# Patient Record
Sex: Female | Born: 1940 | Race: White | Hispanic: No | Marital: Single | Smoking: Never smoker
Health system: Southern US, Community
[De-identification: ages and names within clinical notes are randomized; demographics above are authoritative.]

## PROBLEM LIST (undated history)

## (undated) DIAGNOSIS — K219 Gastro-esophageal reflux disease without esophagitis: Secondary | ICD-10-CM

## (undated) DIAGNOSIS — K635 Polyp of colon: Secondary | ICD-10-CM

## (undated) DIAGNOSIS — E039 Hypothyroidism, unspecified: Secondary | ICD-10-CM

## (undated) DIAGNOSIS — I10 Essential (primary) hypertension: Secondary | ICD-10-CM

## (undated) DIAGNOSIS — R011 Cardiac murmur, unspecified: Secondary | ICD-10-CM

## (undated) DIAGNOSIS — E559 Vitamin D deficiency, unspecified: Secondary | ICD-10-CM

## (undated) DIAGNOSIS — M8580 Other specified disorders of bone density and structure, unspecified site: Secondary | ICD-10-CM

## (undated) DIAGNOSIS — I6529 Occlusion and stenosis of unspecified carotid artery: Secondary | ICD-10-CM

## (undated) DIAGNOSIS — J302 Other seasonal allergic rhinitis: Secondary | ICD-10-CM

## (undated) DIAGNOSIS — M419 Scoliosis, unspecified: Secondary | ICD-10-CM

## (undated) DIAGNOSIS — B019 Varicella without complication: Secondary | ICD-10-CM

## (undated) DIAGNOSIS — K449 Diaphragmatic hernia without obstruction or gangrene: Secondary | ICD-10-CM

## (undated) DIAGNOSIS — I4891 Unspecified atrial fibrillation: Secondary | ICD-10-CM

## (undated) DIAGNOSIS — E785 Hyperlipidemia, unspecified: Secondary | ICD-10-CM

## (undated) DIAGNOSIS — E079 Disorder of thyroid, unspecified: Secondary | ICD-10-CM

## (undated) DIAGNOSIS — F419 Anxiety disorder, unspecified: Secondary | ICD-10-CM

## (undated) DIAGNOSIS — T7840XA Allergy, unspecified, initial encounter: Secondary | ICD-10-CM

## (undated) HISTORY — DX: Vitamin D deficiency, unspecified: E55.9

## (undated) HISTORY — DX: Hyperlipidemia, unspecified: E78.5

## (undated) HISTORY — DX: Cardiac murmur, unspecified: R01.1

## (undated) HISTORY — DX: Occlusion and stenosis of unspecified carotid artery: I65.29

## (undated) HISTORY — DX: Other specified disorders of bone density and structure, unspecified site: M85.80

## (undated) HISTORY — DX: Gastro-esophageal reflux disease without esophagitis: K21.9

## (undated) HISTORY — DX: Diaphragmatic hernia without obstruction or gangrene: K44.9

## (undated) HISTORY — DX: Unspecified atrial fibrillation: I48.91

## (undated) HISTORY — DX: Scoliosis, unspecified: M41.9

## (undated) HISTORY — DX: Disorder of thyroid, unspecified: E07.9

## (undated) HISTORY — DX: Varicella without complication: B01.9

## (undated) HISTORY — DX: Allergy, unspecified, initial encounter: T78.40XA

## (undated) HISTORY — DX: Hypothyroidism, unspecified: E03.9

## (undated) HISTORY — DX: Other seasonal allergic rhinitis: J30.2

## (undated) HISTORY — PX: TONSILLECTOMY: SHX5217

## (undated) HISTORY — DX: Polyp of colon: K63.5

## (undated) HISTORY — PX: EYE SURGERY: SHX253

## (undated) HISTORY — DX: Anxiety disorder, unspecified: F41.9

---

## 1986-08-04 HISTORY — PX: BREAST CYST EXCISION: SHX579

## 2010-12-18 ENCOUNTER — Emergency Department (HOSPITAL_COMMUNITY): Payer: Medicare Other

## 2010-12-18 ENCOUNTER — Inpatient Hospital Stay (HOSPITAL_COMMUNITY)
Admission: EM | Admit: 2010-12-18 | Discharge: 2010-12-19 | DRG: 310 | Disposition: A | Discharge status: Home / Self Care | Payer: Medicare Other | Attending: Cardiology | Admitting: Cardiology

## 2010-12-18 DIAGNOSIS — I4891 Unspecified atrial fibrillation: Principal | ICD-10-CM | POA: Diagnosis present

## 2010-12-18 DIAGNOSIS — E0590 Thyrotoxicosis, unspecified without thyrotoxic crisis or storm: Secondary | ICD-10-CM | POA: Diagnosis present

## 2010-12-18 DIAGNOSIS — E876 Hypokalemia: Secondary | ICD-10-CM | POA: Diagnosis present

## 2010-12-18 DIAGNOSIS — Z882 Allergy status to sulfonamides status: Secondary | ICD-10-CM

## 2010-12-18 LAB — URINALYSIS, ROUTINE W REFLEX MICROSCOPIC
Glucose, UA: NEGATIVE mg/dL
Ketones, ur: NEGATIVE mg/dL
Leukocytes, UA: NEGATIVE
Protein, ur: NEGATIVE mg/dL
Urobilinogen, UA: 0.2 mg/dL (ref 0.0–1.0)

## 2010-12-18 LAB — CBC
Hemoglobin: 14.5 g/dL (ref 12.0–15.0)
MCH: 31.7 pg (ref 26.0–34.0)
MCHC: 33.6 g/dL (ref 30.0–36.0)
Platelets: 159 10*3/uL (ref 150–400)
RBC: 4.58 MIL/uL (ref 3.87–5.11)
WBC: 6 10*3/uL (ref 4.0–10.5)

## 2010-12-18 LAB — DIFFERENTIAL
Basophils Absolute: 0 10*3/uL (ref 0.0–0.1)
Basophils Relative: 0 % (ref 0–1)
Eosinophils Absolute: 0.2 10*3/uL (ref 0.0–0.7)
Lymphocytes Relative: 24 % (ref 12–46)
Monocytes Absolute: 0.5 10*3/uL (ref 0.1–1.0)
Neutro Abs: 3.9 10*3/uL (ref 1.7–7.7)
Neutrophils Relative %: 65 % (ref 43–77)

## 2010-12-18 LAB — BASIC METABOLIC PANEL
BUN: 20 mg/dL (ref 6–23)
Calcium: 9.3 mg/dL (ref 8.4–10.5)
Chloride: 107 mEq/L (ref 96–112)
Creatinine, Ser: 0.81 mg/dL (ref 0.4–1.2)
GFR calc Af Amer: >60 mL/min (ref >60)
Glucose, Bld: 119 mg/dL — ABNORMAL HIGH (ref 70–99)
Sodium: 142 mEq/L (ref 135–145)

## 2010-12-18 LAB — TROPONIN I: Troponin I: <0.3 ng/mL (ref <0.30)

## 2010-12-18 LAB — POCT CARDIAC MARKERS
CKMB, poc: 3.2 ng/mL (ref 1.0–8.0)
Myoglobin, poc: 110 ng/mL (ref 12–200)
Troponin i, poc: <0.05 ng/mL (ref 0.00–0.09)

## 2010-12-18 LAB — CK TOTAL AND CKMB (NOT AT ARMC)
CK, MB: 5.6 ng/mL — ABNORMAL HIGH (ref 0.3–4.0)
Relative Index: 2.7 — ABNORMAL HIGH (ref 0.0–2.5)
Total CK: 206 U/L — ABNORMAL HIGH (ref 7–177)

## 2010-12-19 LAB — BASIC METABOLIC PANEL
BUN: 18 mg/dL (ref 6–23)
CO2: 27 mEq/L (ref 19–32)
Chloride: 108 mEq/L (ref 96–112)
Creatinine, Ser: 0.83 mg/dL (ref 0.4–1.2)
Glucose, Bld: 104 mg/dL — ABNORMAL HIGH (ref 70–99)
Potassium: 3.9 mEq/L (ref 3.5–5.1)

## 2010-12-19 LAB — HEPATIC FUNCTION PANEL
ALT: 19 U/L (ref 0–35)
AST: 21 U/L (ref 0–37)
Albumin: 3.2 g/dL — ABNORMAL LOW (ref 3.5–5.2)
Total Protein: 6 g/dL (ref 6.0–8.3)

## 2010-12-19 LAB — TSH: TSH: 0.97 u[IU]/mL (ref 0.350–4.500)

## 2010-12-19 LAB — CARDIAC PANEL(CRET KIN+CKTOT+MB+TROPI): Relative Index: 3.6 — ABNORMAL HIGH (ref 0.0–2.5)

## 2010-12-20 LAB — CARDIAC PANEL(CRET KIN+CKTOT+MB+TROPI)
Relative Index: 3.3 — ABNORMAL HIGH (ref 0.0–2.5)
Troponin I: 0.39 ng/mL (ref <0.30)

## 2010-12-25 NOTE — Discharge Summary (Signed)
NAMEMARQUISHA, [name]            ACCOUNT NO.:  [id number]  MEDICAL RECORD NO.:  [id number]           PATIENT TYPE:  I  LOCATION:  2013                         FACILITY:  [facility]  PHYSICIAN:  [place] Rocklyn Mayberry, MD         DATE OF BIRTH:  1941-02-27  DATE OF ADMISSION:  12/18/2010 DATE OF DISCHARGE:  12/19/2010                              DISCHARGE SUMMARY   DISCHARGE DIAGNOSES: 1. Atrial fibrillation with rapid ventricular response, new onset,     converted to normal sinus rhythm at 0100 hours morning. 2. History of hyperthyroidism, treated with levothyroxine. 3. Hypokalemia, repleted.  HOSPITAL COURSE:  [name] is a 70 year old Caucasian female originally from [place].  Her medical history is significant for hypothyroidism.  She says that she worked out exercising at 10 a.m. on Dec 18, 2010, and approximately 1330 hours after lunch, developed a strange feeling, felt like her heart was slumping and irregular.  She called EMS, brought her into [facility] ER where she was found to be in atrial fibrillation with RVR.  She was given 2 doses of IV metoprolol, 5 mg each, approximately 1 hour apart.  She was also given 20 mEq of p.o. potassium chloride and then given 10 mg of IV diltiazem approximately 1- 1/2 hours after second dose of metoprolol.  She was started on diltiazem drip at 5 mg an hour.  She converted to normal sinus rhythm approximately at 0100 hours on Dec 18, 2010.  Cardiac enzymes were mildly elevated, troponin was 0.39.  Repeat potassium came back at 3.9. Currently this morning, she has no complaints.  She did state she did not sleep well due to the tension she was getting throughout the evening.  IV diltiazem was discontinued upon conversion to normal sinus rhythm.  She was started on 120 mg of p.o. diltiazem and her heparin will be discontinued.  She will be started on Xarelto 20 mg p.o. daily. We did draw a stat LFTs to get baseline prior to starting the  Xarelto. She should be on Xarelto for approximately 30 days.  She will follow up with Dr. Herbie Baltimore at [facility] approximately 2 weeks.  Prior to that, she will have an outpatient 2D echocardiogram. She has currently been seen by Dr. Rennis Golden who feels that she is stable for discharge.  DISCHARGE LABS:  WBC 6.0, hemoglobin 14.5, hematocrit 43.1, platelets 159.  Sodium 142, potassium 3.9, chloride 108, carbon dioxide 27, glucose 104, BUN 18, creatinine 0.83, calcium 9.4.  Peak troponin of 0.39,  subsequent troponin less than 0.30, CK-MB of 5.3.  Thyroid stimulating hormone is 0.970.  Urinalysis was negative for infection.  STUDIES/PROCEDURES:  Chest x-ray, Dec 18, 2010, shows thoracic deformity related to pronounced levoscoliosis.  Suspicion of early interstitial edema.  Positive hiatal hernia.  DISCHARGE MEDICATIONS: 1. Diltiazem 120 mg CD/ER 1 tab by mouth daily. 2. Xarelto 20 mg 1 tab by mouth daily. 3. Calcium over the counter 1 tab by mouth daily. 4. Fish oil 1000 mg 1 capsule by mouth twice daily.5. Levothyroxine 100 mcg daily with the exemption on Mondays and  Thursdays, at which time she takes 150 mcg. 6. Multivitamin therapeutic 1 tab by mouth daily. 7. Vitamin D over the counter 1 tab by mouth twice daily.  DISPOSITION:  [name] will be discharged home in stable condition. She has no activity restrictions.  It is recommend she is to eat heart- healthy diet.  She will follow up with Dr. Herbie Baltimore in approximately 2 weeks, our office will call her with the appointment time and prior to that, she will have a 2D echocardiogram and will call her with that appointment as well.    ______________________________ Wilburt Finlay, PA   ______________________________ [place] Nada Godley, MD    BH/MEDQ  D:  12/19/2010  T:  12/20/2010  Job:  [id number]  cc:   [facility] Pam Drown, M.D.  Electronically Signed by Wilburt Finlay PA on  12/23/2010 01:50:14 PM Electronically Signed by Kirtland Bouchard. Kareema Keitt M.D. on 12/25/2010 95:62:13 AM

## 2012-01-12 ENCOUNTER — Other Ambulatory Visit: Payer: Self-pay | Admitting: Family Medicine

## 2012-01-12 DIAGNOSIS — I83819 Varicose veins of unspecified lower extremities with pain: Secondary | ICD-10-CM

## 2012-01-14 ENCOUNTER — Inpatient Hospital Stay
Admission: RE | Admit: 2012-01-14 | Discharge: 2012-01-14 | Payer: Medicare Other | Source: Ambulatory Visit | Attending: Family Medicine | Admitting: Family Medicine

## 2012-01-14 ENCOUNTER — Other Ambulatory Visit: Payer: Medicare Other

## 2012-07-09 ENCOUNTER — Other Ambulatory Visit: Payer: Self-pay | Admitting: Family Medicine

## 2012-07-09 DIAGNOSIS — Z1231 Encounter for screening mammogram for malignant neoplasm of breast: Secondary | ICD-10-CM

## 2012-08-13 ENCOUNTER — Ambulatory Visit
Admission: RE | Admit: 2012-08-13 | Discharge: 2012-08-13 | Disposition: A | Discharge status: Home / Self Care | Payer: Medicare Other | Source: Ambulatory Visit | Attending: Family Medicine | Admitting: Family Medicine

## 2012-08-13 DIAGNOSIS — Z1231 Encounter for screening mammogram for malignant neoplasm of breast: Secondary | ICD-10-CM

## 2013-04-26 ENCOUNTER — Encounter: Payer: Self-pay | Admitting: *Deleted

## 2013-04-26 ENCOUNTER — Encounter: Payer: Self-pay | Admitting: Interventional Cardiology

## 2013-04-26 DIAGNOSIS — I4891 Unspecified atrial fibrillation: Secondary | ICD-10-CM | POA: Insufficient documentation

## 2013-04-26 DIAGNOSIS — E039 Hypothyroidism, unspecified: Secondary | ICD-10-CM | POA: Insufficient documentation

## 2013-04-26 DIAGNOSIS — K635 Polyp of colon: Secondary | ICD-10-CM | POA: Insufficient documentation

## 2013-04-26 DIAGNOSIS — E785 Hyperlipidemia, unspecified: Secondary | ICD-10-CM | POA: Insufficient documentation

## 2013-04-26 DIAGNOSIS — E559 Vitamin D deficiency, unspecified: Secondary | ICD-10-CM | POA: Insufficient documentation

## 2013-04-28 ENCOUNTER — Encounter: Payer: Self-pay | Admitting: Interventional Cardiology

## 2013-05-06 ENCOUNTER — Ambulatory Visit (INDEPENDENT_AMBULATORY_CARE_PROVIDER_SITE_OTHER): Payer: Medicare Other | Admitting: Interventional Cardiology

## 2013-05-06 ENCOUNTER — Encounter: Payer: Self-pay | Admitting: Interventional Cardiology

## 2013-05-06 VITALS — BP 145/84 | HR 70 | Ht 67.0 in | Wt 157.0 lb

## 2013-05-06 DIAGNOSIS — R609 Edema, unspecified: Secondary | ICD-10-CM

## 2013-05-06 DIAGNOSIS — I359 Nonrheumatic aortic valve disorder, unspecified: Secondary | ICD-10-CM

## 2013-05-06 DIAGNOSIS — I4891 Unspecified atrial fibrillation: Secondary | ICD-10-CM

## 2013-05-06 MED ORDER — DILTIAZEM HCL ER COATED BEADS 120 MG PO CP24: 120.0000 mg | ORAL_CAPSULE | Freq: Every day | ORAL | Status: DC

## 2013-05-06 NOTE — Patient Instructions (Addendum)
Your physician recommends that you continue on your current medications as directed. Please refer to the Current Medication list given to you today.  Your physician wants you to follow-up in: 1 Year with Dr. Eldridge Dace. You will receive a reminder letter in the mail two months in advance. If you don't receive a letter, please call our office to schedule the follow-up appointment.  Your physician has requested that you have an echocardiogram. Echocardiography is a painless test that uses sound waves to create images of your heart. It provides your doctor with information about the size and shape of your heart and how well your heart's chambers and valves are working. This procedure takes approximately one hour. There are no restrictions for this procedure.

## 2013-05-06 NOTE — Progress Notes (Signed)
Patient ID: Stacy [name], female   DOB: Sep 16, 1940, 72 y.o.   MRN: [id number]    [address] [place], [place]  [address] Phone: [phone] Fax:  [phone]  Date:  05/06/2013   ID:  Stacy [name], DOB [birth year], MRN [id number]  PCP:  No primary provider on file.      History of Present Illness: Stacy [name] is a 72 y.o. female who had an episode of atrial fibrillation in 5/12. She spontaneously converted within the first 24 hours. She was on xarelto, and coumadin but decided to stop anticoagulation die to the need for testing. Her symptoms were very distinct. She has not had any other symptoms since that time. Atrial Fibrillation F/U:  exercises 3x/week. She goes ti the [employer] and does weights, treadmill, classes. No symptoms with this activity. Denies : Chest pain.  Dizziness.  Orthopnea.  Palpitations.  Syncope.     Wt Readings from Last 3 Encounters:  05/06/13 157 lb (71.215 kg)     Past Medical History  Diagnosis Date  . Hypothyroidism   . Osteopenia   . Colon polyp   . Vitamin D deficiency   . Dyslipidemia   . Atrial fibrillation     echo 12/23/10 EF= >55%, stress myoview 01/30/11 normal pattern of perfusion in all myocardial regions    Current Outpatient Prescriptions  Medication Sig Dispense Refill  . aspirin 81 MG tablet Take 81 mg by mouth daily.      . calcium carbonate (OS-CAL) 600 MG TABS tablet Take 600 mg by mouth 2 (two) times daily with a meal.      . Cholecalciferol (VITAMIN D-3) 1000 UNITS CAPS Take by mouth.      . diltiazem (CARDIZEM) 120 MG tablet Take 120 mg by mouth 4 (four) times daily.      . fish oil-omega-3 fatty acids 1000 MG capsule Take 2 g by mouth daily.      [name] Kitchen levothyroxine (SYNTHROID, LEVOTHROID) 100 MCG tablet Take 100 mcg by mouth daily before breakfast.      . LORazepam (ATIVAN) 0.5 MG tablet Take 0.5 mg by mouth every 8 (eight) hours.      . Multiple Vitamin (MULTIVITAMIN) capsule Take 1 capsule by mouth daily.        No current facility-administered medications for this visit.    Allergies:    Allergies  Allergen Reactions  . Sulfa Antibiotics     Social History:  The patient  reports that she has never smoked. She does not have any smokeless tobacco history on file. She reports that she drinks about 0.6 ounces of alcohol per week. She reports that she does not use illicit drugs.   Family History:  The patient's family history includes Anuerysm in her father; CVA in her mother.   ROS:  Please see the history of present illness.  No nausea, vomiting.  No fevers, chills.  No focal weakness.  No dysuria.    All other systems reviewed and negative.   PHYSICAL EXAM: VS:  BP 145/84  Pulse 70  Ht 5\' 7"  (1.702 m)  Wt 157 lb (71.215 kg)  BMI 24.58 kg/m2 Well nourished, well developed, in no acute distress HEENT: normal Neck: no JVD, no carotid bruits Cardiac:  normal S1, S2; RRR; 1/6 systolic murmur,  1/6 diastolic murmur Lungs:  clear to auscultation bilaterally, no wheezing, rhonchi or rales Abd: soft, nontender, no hepatomegaly Ext: bilateral ankle edema Skin: warm and dry Neuro:   no focal abnormalities  noted  EKG:  NSR, LAD, PRWP  ASSESSMENT AND PLAN:  Atrial fibrillation  One known episode of AFib. No further symptoms. SHe does not want to take COumadin or Xarelto and has a good understanding of the risks and benefits. She is comfortable taking aspirin. I have also asked her to stay on the diltiazem as this may help prevent fast heartbeats. She would be more open to taking anticoagulation if she had more frequent episodes of atrial fibrillation. She has changed doctors twice because of this issue. She understands that there are risks of bleeding and dietary restrictions on Coumadin better not present on aspirin. Given her infrequent symptoms, she prefers taking aspirin, despite the risk of stroke. We discussed this today. Prior stress test reviewed. Normal LV function by echo in 2012.     2. Edema of both legs  May be related to Diltiazem. Better in the morning. Try to get ankles above the level of the heart while asleep. Could use support stocking if things get worse.  3. AI- Mild to moderate in 2012.  Would repeat echo.    Signed, Fredric Mare, MD, [facility] 05/06/2013 12:01 PM

## 2013-05-10 ENCOUNTER — Other Ambulatory Visit (HOSPITAL_COMMUNITY): Payer: Self-pay | Admitting: Interventional Cardiology

## 2013-05-10 DIAGNOSIS — I359 Nonrheumatic aortic valve disorder, unspecified: Secondary | ICD-10-CM

## 2013-05-11 ENCOUNTER — Other Ambulatory Visit: Payer: Self-pay

## 2013-05-11 ENCOUNTER — Ambulatory Visit (HOSPITAL_COMMUNITY): Payer: Medicare Other | Attending: Cardiology | Admitting: Radiology

## 2013-05-11 DIAGNOSIS — R609 Edema, unspecified: Secondary | ICD-10-CM | POA: Insufficient documentation

## 2013-05-11 DIAGNOSIS — I379 Nonrheumatic pulmonary valve disorder, unspecified: Secondary | ICD-10-CM | POA: Insufficient documentation

## 2013-05-11 DIAGNOSIS — I4891 Unspecified atrial fibrillation: Secondary | ICD-10-CM | POA: Insufficient documentation

## 2013-05-11 DIAGNOSIS — I079 Rheumatic tricuspid valve disease, unspecified: Secondary | ICD-10-CM | POA: Insufficient documentation

## 2013-05-11 DIAGNOSIS — I359 Nonrheumatic aortic valve disorder, unspecified: Secondary | ICD-10-CM | POA: Insufficient documentation

## 2013-05-11 DIAGNOSIS — R011 Cardiac murmur, unspecified: Secondary | ICD-10-CM | POA: Insufficient documentation

## 2013-05-11 NOTE — Progress Notes (Signed)
Echocardiogram performed.  

## 2013-05-18 ENCOUNTER — Telehealth: Payer: Self-pay | Admitting: Interventional Cardiology

## 2013-05-18 NOTE — Telephone Encounter (Addendum)
New Problem  Pt states that she is having side effects from newly prescribed Diltiazem, extended release.. Symptoms are waking up and not able to sleep/// feeling hyperactive.. Request a call back to discuss.

## 2013-05-18 NOTE — Telephone Encounter (Signed)
Called stating she was changed to Dilitazem long acting at last office visit.  Started taking the extended release Oct 3.  For past few nights she has been unable to get back to sleep after getting up and has felt very "hyper".  Didn't realize the this was extended release.  Is taking 120 mg of Dilitazem CD but would like to switch back to regular dose.  Pharmacy is [facility] on [address] and would like a 90 day supply. Please advise.  Will forward to Dr. Eldridge Dace.

## 2013-05-18 NOTE — Telephone Encounter (Signed)
Is this expected?  OK to call in what she wants.

## 2013-05-18 NOTE — Telephone Encounter (Signed)
Follow up   Waiting to hear if pres has been called in----Pt thinks Dr Eldridge Dace is calling her back today

## 2013-05-19 MED ORDER — DILTIAZEM HCL 120 MG PO TABS: 120.0000 mg | ORAL_TABLET | Freq: Every day | ORAL | Status: DC

## 2013-05-19 NOTE — Telephone Encounter (Signed)
Returned call to patient Dr.Varanasi advised ok to go back to regular diltiazem.Patient stated she use to take diltiazem 120 mg daily.Prescription sent to pharmacy.

## 2013-06-08 ENCOUNTER — Ambulatory Visit (INDEPENDENT_AMBULATORY_CARE_PROVIDER_SITE_OTHER): Payer: Medicare Other | Admitting: Interventional Cardiology

## 2013-06-08 ENCOUNTER — Encounter: Payer: Self-pay | Admitting: Interventional Cardiology

## 2013-06-08 VITALS — BP 152/100 | HR 68 | Ht 67.0 in | Wt 159.0 lb

## 2013-06-08 DIAGNOSIS — R609 Edema, unspecified: Secondary | ICD-10-CM

## 2013-06-08 DIAGNOSIS — I4891 Unspecified atrial fibrillation: Secondary | ICD-10-CM

## 2013-06-08 DIAGNOSIS — T148XXA Other injury of unspecified body region, initial encounter: Secondary | ICD-10-CM

## 2013-06-08 NOTE — Progress Notes (Signed)
Patient ID: Stacy [name], female   DOB: 1941/04/10, 72 y.o.   MRN: [id number] Patient ID: Stacy [name], female   DOB: Apr 03, 1941, 72 y.o.   MRN: [id number]    [address] [place], [place]  [address] Phone: [phone] Fax:  [phone]  Date:  06/08/2013   ID:  Stacy [name], DOB 1940/09/04, MRN [id number]  PCP:  No primary provider on file.      History of Present Illness: Stacy [name] is a 72 y.o. female who had an episode of atrial fibrillation in 5/12. She spontaneously converted within the first 24 hours. She was on xarelto, and coumadin but decided to stop anticoagulation due to the need for testing. Her symptoms were very distinct. She has not had any other symptoms since that time. Atrial Fibrillation F/U:  exercises 3x/week. She goes ti the [employer] and does weights, treadmill, classes. No symptoms with this activity. Denies : Chest pain.  Dizziness.  Orthopnea.  Palpitations.  Syncope.   Last night after taking off her shoes, she notice a large bruise on the insode of her right foot.  SHe has had varicose veins.  She did not have any pain.  Mild swelling bilaterally, more than usual.        Wt Readings from Last 3 Encounters:  06/08/13 159 lb (72.122 kg)  05/06/13 157 lb (71.215 kg)     Past Medical History  Diagnosis Date  . Hypothyroidism   . Osteopenia   . Colon polyp   . Vitamin D deficiency   . Dyslipidemia   . Atrial fibrillation     echo 12/23/10 EF= >55%, stress myoview 01/30/11 normal pattern of perfusion in all myocardial regions    Current Outpatient Prescriptions  Medication Sig Dispense Refill  . aspirin 81 MG tablet Take 81 mg by mouth daily.      . calcium carbonate (OS-CAL) 600 MG TABS tablet Take 600 mg by mouth 2 (two) times daily with a meal.      . Cholecalciferol (VITAMIN D-3) 1000 UNITS CAPS Take by mouth.      . diltiazem (CARDIZEM) 120 MG tablet Take 1 tablet (120 mg total) by mouth daily.  90 tablet  3  . fish  oil-omega-3 fatty acids 1000 MG capsule Take 2 g by mouth daily.      [name] Kitchen levothyroxine (SYNTHROID, LEVOTHROID) 100 MCG tablet Take 100 mcg by mouth daily before breakfast.      . Multiple Vitamin (MULTIVITAMIN) capsule Take 1 capsule by mouth daily.       No current facility-administered medications for this visit.    Allergies:    Allergies  Allergen Reactions  . Sulfa Antibiotics     Social History:  The patient  reports that she has never smoked. She does not have any smokeless tobacco history on file. She reports that she drinks about 0.6 ounces of alcohol per week. She reports that she does not use illicit drugs.   Family History:  The patient's family history includes Anuerysm in her father; CVA in her mother.   ROS:  Please see the history of present illness.  No nausea, vomiting.  No fevers, chills.  No focal weakness.  No dysuria.    All other systems reviewed and negative.   PHYSICAL EXAM: VS:  BP 152/100  Pulse 68  Ht 5\' 7"  (1.702 m)  Wt 159 lb (72.122 kg)  BMI 24.90 kg/m2  SpO2 96% Well nourished, well developed, in no acute distress HEENT: normal  Neck: no JVD, no carotid bruits Cardiac:  normal S1, S2; RRR; 1/6 systolic murmur,  1/6 diastolic murmur Lungs:  clear to auscultation bilaterally, no wheezing, rhonchi or rales Abd: soft, nontender, no hepatomegaly Ext: 1+ bilateral ankle edema Skin: warm and dry Neuro:   no focal abnormalities noted  EKG:  NSR, LAD, PRWP  ASSESSMENT AND PLAN:  Atrial fibrillation  One known episode of AFib. No further symptoms. SHe does not want to take COumadin or Xarelto and has a good understanding of the risks and benefits. She is comfortable taking aspirin. I have also asked her to stay on the diltiazem as this may help prevent fast heartbeats. She would be more open to taking anticoagulation if she had more frequent episodes of atrial fibrillation. She has changed doctors twice because of this issue. She understands that there  are risks of bleeding and dietary restrictions on Coumadin better not present on aspirin. Given her infrequent symptoms, she prefers taking aspirin, despite the risk of stroke. We discussed this today. Prior stress test reviewed. Normal LV function by echo in 2012.   2. Edema of both legs / leg pain and bruising May be related to Diltiazem. Better in the morning.Bruising likely related to a broken varicose vein.  No pain.   Try to get ankles above the level of the heart while asleep. Could use support stocking if things get worse.  Warm compresses to right foot. 3. AI- Mild to moderate in 2012.  Echo repeated in 2014.   Signed, Fredric Mare, MD, [facility] 06/08/2013 2:30 PM

## 2013-06-08 NOTE — Patient Instructions (Signed)
Your physician recommends that you follow up as scheduled.

## 2013-08-03 ENCOUNTER — Telehealth: Payer: Self-pay | Admitting: Interventional Cardiology

## 2013-08-03 NOTE — Telephone Encounter (Signed)
Stacy [name] is this a side effect?

## 2013-08-03 NOTE — Telephone Encounter (Signed)
Pt notified. Pt will f/u with pcp if pale stools continue to occur.

## 2013-08-03 NOTE — Telephone Encounter (Signed)
New message    Pt thinks she is having a side effect from diltiazem----pale stools.  She has no other symptoms

## 2013-08-03 NOTE — Telephone Encounter (Signed)
No, this isn't a side effect of diltiazem that I've ever seen, nor is it one I can find in the drug literature.

## 2013-08-17 ENCOUNTER — Ambulatory Visit (INDEPENDENT_AMBULATORY_CARE_PROVIDER_SITE_OTHER): Payer: Managed Care, Other (non HMO) | Admitting: Family Medicine

## 2013-08-17 ENCOUNTER — Encounter: Payer: Self-pay | Admitting: Family Medicine

## 2013-08-17 VITALS — BP 124/80 | Temp 98.6°F | Ht 66.0 in | Wt 159.0 lb

## 2013-08-17 DIAGNOSIS — Z7689 Persons encountering health services in other specified circumstances: Secondary | ICD-10-CM

## 2013-08-17 DIAGNOSIS — Z7189 Other specified counseling: Secondary | ICD-10-CM

## 2013-08-17 DIAGNOSIS — M949 Disorder of cartilage, unspecified: Secondary | ICD-10-CM

## 2013-08-17 DIAGNOSIS — E785 Hyperlipidemia, unspecified: Secondary | ICD-10-CM

## 2013-08-17 DIAGNOSIS — M8580 Other specified disorders of bone density and structure, unspecified site: Secondary | ICD-10-CM

## 2013-08-17 DIAGNOSIS — M899 Disorder of bone, unspecified: Secondary | ICD-10-CM

## 2013-08-17 DIAGNOSIS — E559 Vitamin D deficiency, unspecified: Secondary | ICD-10-CM

## 2013-08-17 DIAGNOSIS — E039 Hypothyroidism, unspecified: Secondary | ICD-10-CM

## 2013-08-17 DIAGNOSIS — I4891 Unspecified atrial fibrillation: Secondary | ICD-10-CM

## 2013-08-17 LAB — TSH: TSH: 1.18 u[IU]/mL (ref 0.35–5.50)

## 2013-08-17 LAB — BASIC METABOLIC PANEL
BUN: 19 mg/dL (ref 6–23)
CHLORIDE: 104 meq/L (ref 96–112)
CO2: 28 mEq/L (ref 19–32)
Calcium: 9.2 mg/dL (ref 8.4–10.5)
Creatinine, Ser: 0.9 mg/dL (ref 0.4–1.2)
GFR: 65.08 mL/min (ref >60.00)
GLUCOSE: 81 mg/dL (ref 70–99)
POTASSIUM: 4 meq/L (ref 3.5–5.1)
Sodium: 139 mEq/L (ref 135–145)

## 2013-08-17 LAB — LDL CHOLESTEROL, DIRECT: Direct LDL: 148.1 mg/dL

## 2013-08-17 LAB — LIPID PANEL
Cholesterol: 243 mg/dL — ABNORMAL HIGH (ref 0–200)
HDL: 79.3 mg/dL (ref >39.00)
Total CHOL/HDL Ratio: 3
Triglycerides: 88 mg/dL (ref 0.0–149.0)
VLDL: 17.6 mg/dL (ref 0.0–40.0)

## 2013-08-17 LAB — T4, FREE: Free T4: 1.36 ng/dL (ref 0.60–1.60)

## 2013-08-17 NOTE — Progress Notes (Signed)
Pre visit review using our clinic review tool, if applicable. No additional management support is needed unless otherwise documented below in the visit note. 

## 2013-08-17 NOTE — Progress Notes (Signed)
Chief Complaint  Patient presents with  . Establish Care    HPI:  Makela Niehoff is here to establish care. Used to see Dr. Brien Mates - no longer excepts her insurance. Last PCP and physical: had physical 1 year ago, but had labs in June and all was ok.  Has the following chronic problems and concerns today:  Patient Active Problem List   Diagnosis Date Noted  . Edema 05/06/2013  . Hypothyroidism   . Osteopenia   . Colon polyp   . Vitamin D deficiency   . Dyslipidemia   . Atrial fibrillation    Sees cardiologist for her Atrial Fib - only had one episode.   Hypothyroid: -stable for a long time per patient  Sees opthomologist for eye exams  Health Maintenance:  ROS: See pertinent positives and negatives per HPI.  Past Medical History  Diagnosis Date  . Hypothyroidism   . Osteopenia   . Colon polyp   . Vitamin D deficiency   . Dyslipidemia   . Atrial fibrillation     echo 12/23/10 EF= >55%, stress myoview 01/30/11 normal pattern of perfusion in all myocardial regions  . Chicken pox   . Seasonal allergies   . Heart murmur   . Scoliosis   . Thyroid disease   . Colon polyp     Family History  Problem Relation Age of Onset  . CVA Mother   . Anuerysm Father     brain  . Hyperlipidemia Mother   . Stroke Maternal Grandmother   . Hypertension Mother   . Hypertension Sister   . Hypertension Maternal Grandmother     History   Social History  . Marital Status: Single    Spouse Name: N/A    Number of Children: N/A  . Years of Education: N/A   Social History Main Topics  . Smoking status: Never Smoker   . Smokeless tobacco: None  . Alcohol Use: 0.6 oz/week    1 Glasses of wine per week     Comment: 1 glass of winr 3x a week  . Drug Use: No  . Sexual Activity: None   Other Topics Concern  . None   Social History Narrative   Work or School: Probation officer - poetry      Home Situation: lives alone      Spiritual Beliefs: Jewish      Lifestyle: [facility]  [employer] [facility]; diet healthy             Current outpatient prescriptions:aspirin 81 MG tablet, Take 81 mg by mouth daily., Disp: , Rfl: ;  calcium carbonate (OS-CAL) 600 MG TABS tablet, Take 600 mg by mouth 2 (two) times daily with a meal., Disp: , Rfl: ;  Cholecalciferol (VITAMIN D-3) 1000 UNITS CAPS, Take by mouth., Disp: , Rfl: ;  diltiazem (CARDIZEM) 120 MG tablet, Take 1 tablet (120 mg total) by mouth daily., Disp: 90 tablet, Rfl: 3 fish oil-omega-3 fatty acids 1000 MG capsule, Take 2 g by mouth daily., Disp: , Rfl: ;  levothyroxine (SYNTHROID, LEVOTHROID) 100 MCG tablet, Take 100 mcg by mouth daily before breakfast. Take .5 extra pill on Monday and Thursday, Disp: , Rfl: ;  Multiple Vitamin (MULTIVITAMIN) capsule, Take 1 capsule by mouth daily., Disp: , Rfl:   EXAM:  Filed Vitals:   08/17/13 1111  BP: 124/80  Temp: 98.6 F (37 C)    Body mass index is 25.68 kg/(m^2).  GENERAL: vitals reviewed and listed above, alert, oriented, appears  well hydrated and in no acute distress  HEENT: atraumatic, conjunttiva clear, no obvious abnormalities on inspection of external nose and ears  NECK: no obvious masses on inspection  LUNGS: clear to auscultation bilaterally, no wheezes, rales or rhonchi, good air movement  CV: HRRR, no peripheral edema  MS: moves all extremities without noticeable abnormality  PSYCH: pleasant and cooperative, no obvious depression or anxiety  ASSESSMENT AND PLAN:  Discussed the following assessment and plan:  Dyslipidemia - Plan: Lipid Panel  Hypothyroidism - Plan: TSH, T4, Free  Osteopenia  Vitamin D deficiency  Atrial fibrillation - Plan: Basic metabolic panel  Encounter to establish care  -We reviewed the PMH, PSH, FH, SH, Meds and Allergies. -We provided refills for any medications we will prescribe as needed. -We addressed current concerns per orders and patient instructions. -We have asked for records for pertinent exams, studies,  vaccines and notes from previous providers. -We have advised patient to follow up per instructions below. -NON fasting labs -follow up for medicare exam in 4-6 months  -Patient advised to return or notify a doctor immediately if symptoms worsen or persist or new concerns arise.  Patient Instructions  -We have ordered labs or studies at this visit. It can take up to 1-2 weeks for results and processing. We will contact you with instructions IF your results are abnormal. Normal results will be released to your [facility]. If you have not heard from [place] or can not find your results in [facility] in 2 weeks please contact our office.  -PLEASE SIGN UP FOR MYCHART TODAY   We recommend the following healthy lifestyle measures: - eat a healthy diet consisting of lots of vegetables, fruits, beans, nuts, seeds, healthy meats such as white chicken and fish and whole grains.  - avoid fried foods, fast food, processed foods, sodas, red meet and other fattening foods.  - get a least 150 minutes of aerobic exercise per week.   Follow up in: 4-6 months for medicare wellness      KIM, HANNAH R.

## 2013-08-17 NOTE — Patient Instructions (Signed)
-  We have ordered labs or studies at this visit. It can take up to 1-2 weeks for results and processing. We will contact you with instructions IF your results are abnormal. Normal results will be released to your [facility]. If you have not heard from [place] or can not find your results in [facility] in 2 weeks please contact our office.  -PLEASE SIGN UP FOR MYCHART TODAY   We recommend the following healthy lifestyle measures: - eat a healthy diet consisting of lots of vegetables, fruits, beans, nuts, seeds, healthy meats such as white chicken and fish and whole grains.  - avoid fried foods, fast food, processed foods, sodas, red meet and other fattening foods.  - get a least 150 minutes of aerobic exercise per week.   Follow up in: 4-6 months for medicare wellness

## 2013-09-02 ENCOUNTER — Encounter: Payer: Self-pay | Admitting: Family Medicine

## 2013-09-05 MED ORDER — LEVOTHYROXINE SODIUM 100 MCG PO TABS: 100.0000 ug | ORAL_TABLET | Freq: Every day | ORAL | Status: DC

## 2013-09-05 NOTE — Telephone Encounter (Signed)
Yes, thyroid lab was normal, so continue current dose. New prescription sent per request.

## 2013-09-05 NOTE — Telephone Encounter (Signed)
Will you continue the synthroid at the same directions and strength?

## 2013-09-09 ENCOUNTER — Other Ambulatory Visit: Payer: Self-pay

## 2013-09-09 ENCOUNTER — Encounter: Payer: Self-pay | Admitting: Family Medicine

## 2013-09-09 MED ORDER — LEVOTHYROXINE SODIUM 100 MCG PO TABS: 100.0000 ug | ORAL_TABLET | Freq: Every day | ORAL | Status: DC

## 2013-09-09 NOTE — Telephone Encounter (Signed)
Pt called and stated that the quantity of synthroid is not correct.  Sent correct quantity to [facility].

## 2013-10-11 ENCOUNTER — Encounter: Payer: Self-pay | Admitting: Family Medicine

## 2013-10-17 ENCOUNTER — Ambulatory Visit (INDEPENDENT_AMBULATORY_CARE_PROVIDER_SITE_OTHER): Payer: Managed Care, Other (non HMO) | Admitting: Family Medicine

## 2013-10-17 ENCOUNTER — Encounter: Payer: Self-pay | Admitting: Family Medicine

## 2013-10-17 VITALS — BP 124/80 | Temp 98.3°F | Wt 155.0 lb

## 2013-10-17 DIAGNOSIS — M79609 Pain in unspecified limb: Secondary | ICD-10-CM

## 2013-10-17 DIAGNOSIS — L600 Ingrowing nail: Secondary | ICD-10-CM

## 2013-10-17 DIAGNOSIS — M79676 Pain in unspecified toe(s): Secondary | ICD-10-CM

## 2013-10-17 NOTE — Progress Notes (Signed)
Pre visit review using our clinic review tool, if applicable. No additional management support is needed unless otherwise documented below in the visit note. 

## 2013-10-17 NOTE — Progress Notes (Signed)
Chief Complaint  Patient presents with  . 2nd toe on right foot bruised    HPI:  Acute visit for toe pain: -started about 1 month ago -stubbed R 2nd digit and seen in urgent care with xrays normal -improved but still has some pain at tip of toe when wears certain toes -also several ingrown toenails and nail fungus  ROS: See pertinent positives and negatives per HPI.  Past Medical History  Diagnosis Date  . Hypothyroidism   . Osteopenia   . Colon polyp   . Vitamin D deficiency   . Dyslipidemia   . Atrial fibrillation     echo 12/23/10 EF= >55%, stress myoview 01/30/11 normal pattern of perfusion in all myocardial regions  . Chicken pox   . Seasonal allergies   . Heart murmur   . Scoliosis   . Thyroid disease   . Colon polyp     Past Surgical History  Procedure Laterality Date  . Tonsillectomy      Childhood  . Breast cyst excision Right 1988    Family History  Problem Relation Age of Onset  . CVA Mother   . Anuerysm Father     brain  . Hyperlipidemia Mother   . Stroke Maternal Grandmother   . Hypertension Mother   . Hypertension Sister   . Hypertension Maternal Grandmother     History   Social History  . Marital Status: Single    Spouse Name: N/A    Number of Children: N/A  . Years of Education: N/A   Social History Main Topics  . Smoking status: Never Smoker   . Smokeless tobacco: None  . Alcohol Use: 0.6 oz/week    1 Glasses of wine per week     Comment: 1 glass of winr 3x a week  . Drug Use: No  . Sexual Activity: None   Other Topics Concern  . None   Social History Narrative   Work or School: Probation officer - poetry      Home Situation: lives alone      Spiritual Beliefs: Jewish      Lifestyle[facility]; diet healthy             Current outpatient prescriptions:aspirin 81 MG tablet, Take 81 mg by mouth daily., Disp: , Rfl: ;  calcium carbonate (OS-CAL) 600 MG TABS tablet, Take 600 mg by mouth 2 (two) times daily with a  meal., Disp: , Rfl: ;  Cholecalciferol (VITAMIN D-3) 1000 UNITS CAPS, Take by mouth., Disp: , Rfl: ;  diltiazem (CARDIZEM) 120 MG tablet, Take 1 tablet (120 mg total) by mouth daily., Disp: 90 tablet, Rfl: 3 fish oil-omega-3 fatty acids 1000 MG capsule, Take 2 g by mouth daily., Disp: , Rfl: ;  levothyroxine (SYNTHROID, LEVOTHROID) 100 MCG tablet, Take 1 tablet (100 mcg total) by mouth daily before breakfast. Take .5 extra pill on Monday and Thursday, Disp: 102 tablet, Rfl: 3;  Multiple Vitamin (MULTIVITAMIN) capsule, Take 1 capsule by mouth daily., Disp: , Rfl:   EXAM:  Filed Vitals:   10/17/13 1556  BP: 124/80  Temp: 98.3 F (36.8 C)    Body mass index is 25.03 kg/(m^2).  GENERAL: vitals reviewed and listed above, alert, oriented, appears well hydrated and in no acute distress  FEET: ingrown toenail of 2nd R digit with pain with pressure over this nail, no swelling or erythema, no jt line TTP  MS: moves all extremities without noticeable abnormality  PSYCH: pleasant and  cooperative, no obvious depression or anxiety  ASSESSMENT AND PLAN:  Discussed the following assessment and plan:  Toe pain - Plan: Ambulatory referral to Podiatry  Ingrown toenail - Plan: Ambulatory referral to Podiatry  -suspect this pain is actually related to her ingrown toenail vs oa as a result of stubbed toe - she wants to see podiatry for partial toenail removal so referral placed -Patient advised to return or notify a doctor immediately if symptoms worsen or persist or new concerns arise.  There are no Patient Instructions on file for this visit.   Colin Benton R.

## 2013-10-17 NOTE — Patient Instructions (Signed)
-  wear comfortable wide toe shoes  -We placed a referral for you as discussed to the podiatrist. It usually takes about 1-2 weeks to process and schedule this referral. If you have not heard from [place] regarding this appointment in 2 weeks please contact our office.  -follow up as needed

## 2013-10-26 ENCOUNTER — Telehealth: Payer: Self-pay | Admitting: Family Medicine

## 2013-10-26 ENCOUNTER — Encounter: Payer: Self-pay | Admitting: Podiatry

## 2013-10-26 ENCOUNTER — Ambulatory Visit (INDEPENDENT_AMBULATORY_CARE_PROVIDER_SITE_OTHER): Payer: Managed Care, Other (non HMO)

## 2013-10-26 ENCOUNTER — Ambulatory Visit (INDEPENDENT_AMBULATORY_CARE_PROVIDER_SITE_OTHER): Payer: Managed Care, Other (non HMO) | Admitting: Podiatry

## 2013-10-26 VITALS — BP 125/80 | HR 83 | Resp 15 | Ht 66.0 in | Wt 150.0 lb

## 2013-10-26 DIAGNOSIS — B351 Tinea unguium: Secondary | ICD-10-CM

## 2013-10-26 DIAGNOSIS — M79609 Pain in unspecified limb: Secondary | ICD-10-CM

## 2013-10-26 DIAGNOSIS — S93609A Unspecified sprain of unspecified foot, initial encounter: Secondary | ICD-10-CM

## 2013-10-26 NOTE — Progress Notes (Signed)
Subjective:     Patient ID: Stacy [name], female   DOB: 1941-02-26, 73 y.o.   MRN: [id number]  HPI patient states that she injured her second toe about 6 weeks ago and is concerned because it still swollen and painful and she has nail disease of her big toenails that she wanted to get checked and the fifth nail left   Review of Systems  All other systems reviewed and are negative.       Objective:   Physical Exam  Constitutional: She is oriented to person, place, and time.  Cardiovascular: Intact distal pulses.   Musculoskeletal: Normal range of motion.  Neurological: She is oriented to person, place, and time.  Skin: Skin is warm.   neurovascular status intact with continued discomfort right second toe secondary to injury and nail disease left fifth nail and slightly on the big toenails of both feet    Assessment:     Trauma with possible fracture second toe right with sprain foot and also mycotic traumatized nail disease    Plan:     H&P and x-ray reviewed and today I advised on wide toe box shoes for the right foot and no treatment for the nails as it appears to be more trauma and its orientation today to patient

## 2013-10-26 NOTE — Telephone Encounter (Signed)
Patient came in stating that she was suppose to get a followup call from you in reference to a billing issue (she didn't tell me what the issue was). She had an appt on 10/17/13 with Dr. Maudie Mercury and before she left, she states that Dr. Maudie Mercury had her to speak with you about the billing issue and that you advised her that you would call her back before the end of the week. She just wants to know if you have looked into this matter and if it has been resolved. Her home (contact) number has been verified. I informed her that I would put a note on file and make sure you got it. Thanks.

## 2013-10-26 NOTE — Progress Notes (Signed)
   Subjective:    Patient ID: Stacy [name], female    DOB: 08-10-1940, 73 y.o.   MRN: [id number]  HPI N toe injury        L right 2nd toe        D and O 6 weeks        C swelling, pain and no sensation at all and discoloration        A walking and dori-flexion in the  Morning        T Urgent Care x-ray was negative, Dr Maudie Mercury recommended warm salt water soaks.  Pt states left 5th toenail is discolored and thick,  Dr Maudie Mercury feels may be fungal infection and pt is using OTC antifungal prep.  Review of Systems  All other systems reviewed and are negative.       Objective:   Physical Exam        Assessment & Plan:

## 2013-12-23 NOTE — Telephone Encounter (Signed)
Lm for patient to return my call.  Pt needs to contact [employer] personally.

## 2014-04-26 ENCOUNTER — Other Ambulatory Visit: Payer: Self-pay | Admitting: *Deleted

## 2014-04-26 MED ORDER — DILTIAZEM HCL 120 MG PO TABS: 120.0000 mg | ORAL_TABLET | Freq: Every day | ORAL | Status: DC

## 2014-05-03 ENCOUNTER — Other Ambulatory Visit: Payer: Self-pay | Admitting: *Deleted

## 2014-05-03 MED ORDER — DILTIAZEM HCL ER COATED BEADS 120 MG PO CP24: 120.0000 mg | ORAL_CAPSULE | Freq: Every day | ORAL | Status: DC

## 2014-05-15 ENCOUNTER — Ambulatory Visit (INDEPENDENT_AMBULATORY_CARE_PROVIDER_SITE_OTHER): Payer: Managed Care, Other (non HMO) | Admitting: Interventional Cardiology

## 2014-05-15 ENCOUNTER — Encounter: Payer: Self-pay | Admitting: Interventional Cardiology

## 2014-05-15 VITALS — BP 142/68 | HR 65 | Ht 67.0 in | Wt 153.8 lb

## 2014-05-15 DIAGNOSIS — I351 Nonrheumatic aortic (valve) insufficiency: Secondary | ICD-10-CM | POA: Insufficient documentation

## 2014-05-15 DIAGNOSIS — R609 Edema, unspecified: Secondary | ICD-10-CM

## 2014-05-15 DIAGNOSIS — I480 Paroxysmal atrial fibrillation: Secondary | ICD-10-CM

## 2014-05-15 NOTE — Patient Instructions (Signed)
Your physician recommends that you continue on your current medications as directed. Please refer to the Current Medication list given to you today.  Your physician wants you to follow-up in: 1 year with Dr. Varanasi. You will receive a reminder letter in the mail two months in advance. If you don't receive a letter, please call our office to schedule the follow-up appointment.  

## 2014-05-15 NOTE — Progress Notes (Signed)
Patient ID: Stacy [name], female   DOB: 1940/12/28, 73 y.o.   MRN: [id number] Patient ID: Stacy [name], female   DOB: 1940-10-27, 73 y.o.   MRN: [id number]     Stacy [name], [address] [place], [place]  [address] Phone: [phone] Fax:  [phone]  Date:  05/15/2014   ID:  Stacy [name], DOB Nov 01, 1940, MRN [id number]  PCP:  [name], DO      History of Present Illness: Stacy [name] is a 73 y.o. female who had an episode of atrial fibrillation in 5/12. She spontaneously converted within the first 24 hours. She was on xarelto, and coumadin but decided to stop anticoagulation due to the need for testing. Her symptoms were very distinct. She has not had any other symptoms since that time. Atrial Fibrillation F/U:  exercises 3x/week. She goes to the [facility] and does weights, treadmill, classes. No symptoms with this activity. Denies : Chest pain.  Dizziness.  Orthopnea.  Palpitations.  Syncope.    SHe has had varicose veins.  She did not have any pain.  Mild swelling bilaterally, much improved after changing to Cartia XT instead of diltiazem.  Exercising regularly wihtout problems.  No further AFib sx.   No bruising.      Wt Readings from Last 3 Encounters:  05/15/14 153 lb 12.8 oz (69.763 kg)  10/26/13 150 lb (68.04 kg)  10/17/13 155 lb (70.308 kg)     Past Medical History  Diagnosis Date  . Hypothyroidism   . Osteopenia   . Colon polyp   . Vitamin D deficiency   . Dyslipidemia   . Atrial fibrillation     echo 12/23/10 EF= >55%, stress myoview 01/30/11 normal pattern of perfusion in all myocardial regions  . Chicken pox   . Seasonal allergies   . Heart murmur   . Scoliosis   . Thyroid disease   . Colon polyp     Current Outpatient Prescriptions  Medication Sig Dispense Refill  . aspirin 81 MG tablet Take 81 mg by mouth daily.      . calcium carbonate (OS-CAL) 600 MG TABS tablet Take 600 mg by mouth 2 (two) times daily with a meal.      .  Cholecalciferol (VITAMIN D-3) 1000 UNITS CAPS Take 2,000 Units by mouth daily.       [name] Kitchen diltiazem (CARDIZEM CD) 120 MG 24 hr capsule Take 1 capsule (120 mg total) by mouth daily.  90 capsule  0  . fish oil-omega-3 fatty acids 1000 MG capsule Take 2 g by mouth daily.      [name] Kitchen levothyroxine (SYNTHROID, LEVOTHROID) 100 MCG tablet Take 1 tablet (100 mcg total) by mouth daily before breakfast. Take .5 extra pill on Monday and Thursday  102 tablet  3  . Multiple Vitamin (MULTIVITAMIN) capsule Take 1 capsule by mouth daily.       No current facility-administered medications for this visit.    Allergies:    Allergies  Allergen Reactions  . Amoxicillin Swelling  . Sulfa Antibiotics     Social History:  The patient  reports that she has never smoked. She does not have any smokeless tobacco history on file. She reports that she drinks about .6 ounces of alcohol per week. She reports that she does not use illicit drugs.   Family History:  The patient's family history includes Anuerysm in her father; CVA in her mother; Hyperlipidemia in her mother; Hypertension in her maternal grandmother, mother, and sister; Stroke in her  maternal grandmother.   ROS:  Please see the history of present illness.  No nausea, vomiting.  No fevers, chills.  No focal weakness.  No dysuria.    All other systems reviewed and negative.   PHYSICAL EXAM: VS:  BP 142/68  Pulse 65  Ht 5\' 7"  (1.702 m)  Wt 153 lb 12.8 oz (69.763 kg)  BMI 24.08 kg/m2 Well nourished, well developed, in no acute distress HEENT: normal Neck: no JVD, no carotid bruits Cardiac:  normal S1, S2; RRR; 1/6 systolic murmur,  1/6 diastolic murmur Lungs:  clear to auscultation bilaterally, no wheezing, rhonchi or rales Abd: soft, nontender, no hepatomegaly Ext: 1+ bilateral ankle/pretibial  edema Skin: warm and dry Neuro:   no focal abnormalities noted  EKG:  NSR, LAD, PRWP  ASSESSMENT AND PLAN:  Atrial fibrillation  One known episode of AFib. No  further symptoms. SHe does not want to take COumadin or Xarelto and has a good understanding of the risks and benefits. She is comfortable taking aspirin. I have also asked her to stay on the diltiazem as this may help prevent fast heartbeats.  She felt her AFib in the past.  She would be more open to taking anticoagulation if she had more frequent episodes of atrial fibrillation. She has changed doctors twice because of this issue. She understands that there are risks of bleeding and dietary restrictions on Coumadin better not present on aspirin. Given her infrequent symptoms, she prefers taking aspirin, despite the risk of stroke. We discussed this today. Prior stress test reviewed. Normal LV function by echo in 2012.   2. Edema of both legs / leg pain and bruising  Better now.   Try to get ankles above the level of the heart while asleep. Could use support stocking if things get worse. 3. AI- Mild to moderate in 2012.  Echo repeated in 2014. Normal LV function. Mild AI.  Signed, Mina Marble, MD, [facility] 05/15/2014 11:34 AM

## 2014-05-19 ENCOUNTER — Ambulatory Visit (INDEPENDENT_AMBULATORY_CARE_PROVIDER_SITE_OTHER): Payer: Managed Care, Other (non HMO)

## 2014-05-19 DIAGNOSIS — Z23 Encounter for immunization: Secondary | ICD-10-CM

## 2014-05-30 ENCOUNTER — Ambulatory Visit: Payer: Managed Care, Other (non HMO) | Admitting: *Deleted

## 2014-06-02 ENCOUNTER — Ambulatory Visit: Payer: Managed Care, Other (non HMO) | Admitting: *Deleted

## 2014-07-31 ENCOUNTER — Other Ambulatory Visit: Payer: Self-pay | Admitting: Interventional Cardiology

## 2014-08-01 ENCOUNTER — Other Ambulatory Visit: Payer: Self-pay

## 2014-08-01 NOTE — Telephone Encounter (Signed)
Jettie Booze, MD at 05/15/2014 11:34 AM     diltiazem (CARDIZEM CD) 120 MG 24 hr capsule Take 1 capsule (120 mg total) by mouth daily    Patient Instructions     Your physician recommends that you continue on your current medications as directed. Please refer to the Current Medication list given to you today.

## 2014-08-08 ENCOUNTER — Other Ambulatory Visit: Payer: Self-pay | Admitting: *Deleted

## 2014-08-08 MED ORDER — DILTIAZEM HCL ER COATED BEADS 120 MG PO CP24: 120.0000 mg | ORAL_CAPSULE | Freq: Every day | ORAL | Status: DC

## 2014-08-08 NOTE — Telephone Encounter (Signed)
Patient called in for additional refills to get her through until her yearly follow up, which she is not due for until October.

## 2014-08-18 ENCOUNTER — Encounter: Payer: Self-pay | Admitting: Family Medicine

## 2014-08-18 ENCOUNTER — Ambulatory Visit (INDEPENDENT_AMBULATORY_CARE_PROVIDER_SITE_OTHER): Payer: Managed Care, Other (non HMO) | Admitting: Family Medicine

## 2014-08-18 VITALS — BP 138/80 | HR 61 | Temp 97.3°F | Ht 66.0 in | Wt 154.6 lb

## 2014-08-18 DIAGNOSIS — E785 Hyperlipidemia, unspecified: Secondary | ICD-10-CM

## 2014-08-18 DIAGNOSIS — E2839 Other primary ovarian failure: Secondary | ICD-10-CM

## 2014-08-18 DIAGNOSIS — Z0000 Encounter for general adult medical examination without abnormal findings: Secondary | ICD-10-CM

## 2014-08-18 DIAGNOSIS — E039 Hypothyroidism, unspecified: Secondary | ICD-10-CM

## 2014-08-18 DIAGNOSIS — I4891 Unspecified atrial fibrillation: Secondary | ICD-10-CM

## 2014-08-18 LAB — LIPID PANEL
Cholesterol: 204 mg/dL — ABNORMAL HIGH (ref 0–200)
HDL: 79.5 mg/dL (ref >39.00)
LDL Cholesterol: 114 mg/dL — ABNORMAL HIGH (ref 0–99)
NonHDL: 124.5
TRIGLYCERIDES: 55 mg/dL (ref 0.0–149.0)
Total CHOL/HDL Ratio: 3
VLDL: 11 mg/dL (ref 0.0–40.0)

## 2014-08-18 LAB — BASIC METABOLIC PANEL
BUN: 27 mg/dL — ABNORMAL HIGH (ref 6–23)
CHLORIDE: 105 meq/L (ref 96–112)
CO2: 29 meq/L (ref 19–32)
Calcium: 9.2 mg/dL (ref 8.4–10.5)
Creatinine, Ser: 0.79 mg/dL (ref 0.40–1.20)
GFR: 75.44 mL/min (ref >60.00)
GLUCOSE: 92 mg/dL (ref 70–99)
Potassium: 3.8 mEq/L (ref 3.5–5.1)
Sodium: 140 mEq/L (ref 135–145)

## 2014-08-18 LAB — TSH: TSH: 0.88 u[IU]/mL (ref 0.35–4.50)

## 2014-08-18 MED ORDER — LEVOTHYROXINE SODIUM 100 MCG PO TABS: 100.0000 ug | ORAL_TABLET | Freq: Every day | ORAL | Status: DC

## 2014-08-18 NOTE — Progress Notes (Signed)
Medicare Annual Preventive Care Visit  (initial annual wellness or annual wellness exam)  Concerns and/or follow up today:  She reports she wants a physical AND a wellness visit and is not concerned about insurance in regards to this.  Hypothyroidism: -takes synthroid -reports stable for some time  Hx of scoliosis: -has seen specialist in the past -no surgery recommended -reports stable  A. Fib: -followed by her cardiologist -on asa and diltiazem therapy only after discussion with cardiologist. -denies: CP, palpitations, DOE  ROS: negative for report of fevers, unintentional weight loss, vision changes, vision loss, hearing loss or change, chest pain, sob, hemoptysis, melena, hematochezia, hematuria, genital discharge or lesions, falls, bleeding or bruising, loc, thoughts of suicide or self harm, memory loss  1.) Patient-completed health risk assessment  - completed and reviewed, see scanned documentation  2.) Review of Medical History: -PMH, PSH, Family History and current specialty and care providers reviewed and updated and listed below  - see scanned in document in chart and below  Past Medical History  Diagnosis Date  . Hypothyroidism   . Osteopenia   . Colon polyp   . Vitamin D deficiency   . Dyslipidemia   . Atrial fibrillation     echo 12/23/10 EF= >55%, stress myoview 01/30/11 normal pattern of perfusion in all myocardial regions  . Chicken pox   . Seasonal allergies   . Heart murmur   . Scoliosis   . Thyroid disease   . Colon polyp     Past Surgical History  Procedure Laterality Date  . Tonsillectomy      Childhood  . Breast cyst excision Right 1988  . Eye surgery      Cataract eye surgery-right 06/2014, left 04/2014    History   Social History  . Marital Status: Single    Spouse Name: N/A    Number of Children: N/A  . Years of Education: N/A   Occupational History  . Not on file.   Social History Main Topics  . Smoking status: Never Smoker    . Smokeless tobacco: Not on file  . Alcohol Use: 0.6 oz/week    1 Glasses of wine per week     Comment: 1 glass of winr 3x a week  . Drug Use: No  . Sexual Activity: Not on file   Other Topics Concern  . Not on file   Social History Narrative   Work or School: [employer] Situation: lives alone      Spiritual Beliefs: Jewish      Lifestyle[facility] [employer] [facility]; diet healthy             The patient has a family history of  3.) Review of functional ability and level of safety:  Any difficulty hearing? NO  History of falling? NO  Any trouble with IADLs - using a phone, using transportation, grocery shopping, preparing meals, doing housework, doing laundry, taking medications and managing money? NO  Advance Directives? YES   See summary of recommendations in Patient Instructions below.  4.) Physical Exam Filed Vitals:   08/18/14 0937  BP: 138/80  Pulse: 61  Temp: 97.3 F (36.3 C)   Estimated body mass index is 24.96 kg/(m^2) as calculated from the following:   Height as of this encounter: 5\' 6"  (1.676 m).   Weight as of this encounter: 154 lb 9.6 oz (70.126 kg).  EKG (optional): deferred  General: alert, appear well hydrated  and in no acute distress  HEENT: visual acuity grossly intact, normal appearance of ear and eyes and nose, PERRLA  CV: HRRR  Lungs: CTA bilaterally  ABD: BS+, soft, NTTP  BREAST: normal  GU: deferred  Psych: pleasant and cooperative, no obvious depression or anxiety  Cognitive function grossly intact  MS: scoliosis of spine - thoracic and lumbar concave r  See patient instructions for recommendations.  Education and counseling regarding the above review of health provided with a plan for the following: -see scanned patient completed form for further details -fall prevention strategies discussed  -healthy lifestyle discussed -importance and resources for completing advanced directives  discussed -see patient instructions below for any other recommendations provided  4)The following written screening schedule of preventive measures were reviewed with assessment and plan made per below, orders and patient instructions:      AAA screening done     Alcohol screening done     Obesity Screening and counseling done     STI screening n/a     Tobacco Screening done       Pneumococcal (PPSV23 -one dose after 64, one before if risk factors), influenza yearly and hepatitis B vaccines (if high risk - end stage renal disease, IV drugs, homosexual men, live in home for mentally retarded, hemophilia receiving factors) ASSESSMENT/PLAN: declined      Screening mammograph (yearly if >40) ASSESSMENT/PLAN: advised to schedule      Screening Pap smear/pelvic exam (q2 years) ASSESSMENT/PLAN: n/a      Prostate cancer screening ASSESSMENT/PLAN: n/a      Colorectal cancer screening (FOBT yearly or flex sig q4y or colonoscopy q10y or barium enema q4y) ASSESSMENT/PLAN: UTD      Diabetes outpatient self-management training services ASSESSMENT/PLAN:n/a      Bone mass measurements(covered q2y if indicated - estrogen def, osteoporosis, hyperparathyroid, vertebral abnormalities, osteoporosis or steroids) ASSESSMENT/PLAN: she wants to get this      Screening for glaucoma(q1y if high risk - diabetes, FH, AA and > 50 or hispanic and > 65) ASSESSMENT/PLAN: sees opthomologist      Medical nutritional therapy for individuals with diabetes or renal disease ASSESSMENT/PLAN: n/a      Cardiovascular screening blood tests (lipids q5y) ASSESSMENT/PLAN:  FASTING      Diabetes screening tests ASSESSMENT/PLAN: done   7.) Summary: -risk factors and conditions per above assessment were discussed and treatment, recommendations and referrals were offered per documentation above and orders and patient instructions.  Medicare annual wellness visit, initial  Physical exam - Plan: Basic metabolic  panel, Lipid Panel, TSH  Hypothyroidism, unspecified hypothyroidism type - Plan: TSH  Dyslipidemia - Plan: Lipid Panel  Atrial fibrillation, unspecified - Plan: Basic metabolic panel  Estrogen deficiency - Plan: DG Bone Density  There are no Patient Instructions on file for this visit.

## 2014-08-18 NOTE — Patient Instructions (Signed)
We have ordered labs or studies at this visit. It can take up to 1-2 weeks for results and processing. We will contact you with instructions IF your results are abnormal. Normal results will be released to your [facility]. If you have not heard from [place] or can not find your results in [facility] in 2 weeks please contact our office.  We recommend the following healthy lifestyle measures: - eat a healthy diet consisting of lots of vegetables, fruits, beans, nuts, seeds, healthy meats such as white chicken and fish and whole grains.  - avoid fried foods, fast food, processed foods, sodas, red meet and other fattening foods.  - get a least 150 minutes of aerobic exercise per week.   Schedule mammogram

## 2014-08-18 NOTE — Progress Notes (Signed)
Pre visit review using our clinic review tool, if applicable. No additional management support is needed unless otherwise documented below in the visit note. 

## 2014-08-22 ENCOUNTER — Other Ambulatory Visit: Payer: Self-pay

## 2014-08-22 DIAGNOSIS — Z1231 Encounter for screening mammogram for malignant neoplasm of breast: Secondary | ICD-10-CM

## 2014-08-28 ENCOUNTER — Ambulatory Visit: Payer: Managed Care, Other (non HMO)

## 2014-09-01 ENCOUNTER — Ambulatory Visit
Admission: RE | Admit: 2014-09-01 | Discharge: 2014-09-01 | Disposition: A | Discharge status: Home / Self Care | Payer: Managed Care, Other (non HMO) | Source: Ambulatory Visit | Attending: Family Medicine | Admitting: Family Medicine

## 2014-09-01 ENCOUNTER — Ambulatory Visit
Admission: RE | Admit: 2014-09-01 | Discharge: 2014-09-01 | Disposition: A | Discharge status: Home / Self Care | Payer: Managed Care, Other (non HMO) | Source: Ambulatory Visit

## 2014-09-01 DIAGNOSIS — Z1231 Encounter for screening mammogram for malignant neoplasm of breast: Secondary | ICD-10-CM

## 2014-09-01 DIAGNOSIS — E2839 Other primary ovarian failure: Secondary | ICD-10-CM

## 2014-09-12 ENCOUNTER — Telehealth: Payer: Self-pay | Admitting: Family Medicine

## 2014-09-12 NOTE — Telephone Encounter (Signed)
Called and discussed bone density results with patient. Advised exercise, adequate calcium and vit d and treatment. She is adamantly against tx other then lifestyle currently.

## 2014-10-26 ENCOUNTER — Telehealth: Payer: Self-pay | Admitting: Interventional Cardiology

## 2014-10-26 NOTE — Telephone Encounter (Signed)
Returned pt's call. Informed pt that she could take plain Mucinex. Informed pt not to get Mucinex DM. Also informed pt that once at the drugstore, if she is unsure of which is correct to ask the pharmacist and they will help her find correct medication. Pt verbalized understanding and was in agreement with this plan.

## 2014-10-26 NOTE — Telephone Encounter (Signed)
New Msg        Pt calling, would like to know if its okay for her to take decongestant for allergies.   Please return call.

## 2014-10-31 ENCOUNTER — Other Ambulatory Visit: Payer: Self-pay

## 2014-10-31 ENCOUNTER — Telehealth: Payer: Self-pay

## 2014-10-31 MED ORDER — DILTIAZEM HCL ER COATED BEADS 120 MG PO CP24: 120.0000 mg | ORAL_CAPSULE | Freq: Every day | ORAL | Status: DC

## 2014-10-31 NOTE — Telephone Encounter (Signed)
Patient calling to report that she has noticed this week after returning from walking at the gym that she has "another broken vessel - this time on my right foot". She states it is similar to the one Dr. Irish Lack saw in 2014. Denies pain, swelling or tingling to the area. States it is "just black and blue". She is wanting advisement from Dr. Irish Lack. Reviewed by Dr. Irish Lack. Advised for patient to do warm compresses to right foot. Reiterated importance of elevating feet above heart level while asleep and at rest. Gave patient ideas on how to accomplish this idea - pillow placement, wedging foot of mattress up, foot of bed elevator pads. Patient states all of these would be too difficult for her to do. She states she elevates feet when watching tv and reading and will continue that method. Discussed benefit of support stockings/support socks. Patient is concerned these would be too tight. Encouraged her to try the warm compresses twice daily for a week and if right foot has not started to improve, support stockings would be a suggestion to consider. She verbalized understanding and stated she would think about the suggestions. She will call back if things get worse or fail to improve in the week.

## 2014-11-07 ENCOUNTER — Telehealth: Payer: Self-pay | Admitting: Interventional Cardiology

## 2014-11-07 NOTE — Telephone Encounter (Signed)
Will send to nurse who addressed this issue to make her aware.

## 2014-11-07 NOTE — Telephone Encounter (Signed)
New message      Want nurse to know her foot is much better.  Warm foot soaks worked

## 2014-12-11 ENCOUNTER — Encounter: Payer: Self-pay | Admitting: Family Medicine

## 2014-12-11 ENCOUNTER — Ambulatory Visit (INDEPENDENT_AMBULATORY_CARE_PROVIDER_SITE_OTHER): Payer: Medicare HMO | Admitting: Family Medicine

## 2014-12-11 VITALS — BP 122/78 | HR 90 | Temp 98.0°F | Ht 66.0 in | Wt 146.7 lb

## 2014-12-11 DIAGNOSIS — J069 Acute upper respiratory infection, unspecified: Secondary | ICD-10-CM | POA: Diagnosis not present

## 2014-12-11 MED ORDER — BENZONATATE 100 MG PO CAPS: 100.0000 mg | ORAL_CAPSULE | Freq: Two times a day (BID) | ORAL | Status: DC | PRN

## 2014-12-11 NOTE — Progress Notes (Signed)
Pre visit review using our clinic review tool, if applicable. No additional management support is needed unless otherwise documented below in the visit note. 

## 2014-12-11 NOTE — Progress Notes (Signed)
HPI:  URI: -started: 1 week ago -symptoms:nasal congestion, PND, cough -denies:fever, SOB, NVD, tooth pain -has tried: claritin -sick contacts/travel/risks: denies flu exposure, tick exposure or or Ebola risks -Hx of: allergies ROS: See pertinent positives and negatives per HPI.  Past Medical History  Diagnosis Date  . Hypothyroidism   . Osteopenia   . Colon polyp   . Vitamin D deficiency   . Dyslipidemia   . Atrial fibrillation     echo 12/23/10 EF= >55%, stress myoview 01/30/11 normal pattern of perfusion in all myocardial regions  . Chicken pox   . Seasonal allergies   . Heart murmur   . Scoliosis   . Thyroid disease   . Colon polyp     Past Surgical History  Procedure Laterality Date  . Tonsillectomy      Childhood  . Breast cyst excision Right 1988  . Eye surgery      Cataract eye surgery-right 06/2014, left 04/2014    Family History  Problem Relation Age of Onset  . CVA Mother   . Anuerysm Father     brain  . Hyperlipidemia Mother   . Stroke Maternal Grandmother   . Hypertension Mother   . Hypertension Sister   . Hypertension Maternal Grandmother     History   Social History  . Marital Status: Single    Spouse Name: N/A  . Number of Children: N/A  . Years of Education: N/A   Social History Main Topics  . Smoking status: Never Smoker   . Smokeless tobacco: Not on file  . Alcohol Use: 0.6 oz/week    1 Glasses of wine per week     Comment: 1 glass of winr 3x a week  . Drug Use: No  . Sexual Activity: Not on file   Other Topics Concern  . None   Social History Narrative   Work or School: Probation officer - poetry      Home Situation: lives alone      Spiritual Beliefs: Jewish      Lifestyle: [facility] [employer] [facility]; diet healthy              Current outpatient prescriptions:  .  aspirin 81 MG tablet, Take 81 mg by mouth daily., Disp: , Rfl:  .  calcium carbonate (OS-CAL) 600 MG TABS tablet, Take 600 mg by mouth 2 (two) times daily  with a meal., Disp: , Rfl:  .  Cholecalciferol (VITAMIN D-3) 1000 UNITS CAPS, Take 2,000 Units by mouth daily. , Disp: , Rfl:  .  diltiazem (CARTIA XT) 120 MG 24 hr capsule, Take 1 capsule (120 mg total) by mouth daily., Disp: 90 capsule, Rfl: 2 .  fish oil-omega-3 fatty acids 1000 MG capsule, Take 2 g by mouth daily., Disp: , Rfl:  .  levothyroxine (SYNTHROID, LEVOTHROID) 100 MCG tablet, Take 1 tablet (100 mcg total) by mouth daily before breakfast. Take .5 extra pill on Monday and Thursday, Disp: 102 tablet, Rfl: 3 .  Multiple Vitamin (MULTIVITAMIN) capsule, Take 1 capsule by mouth daily., Disp: , Rfl:  .  benzonatate (TESSALON) 100 MG capsule, Take 1 capsule (100 mg total) by mouth 2 (two) times daily as needed for cough., Disp: 20 capsule, Rfl: 0  EXAM:  Filed Vitals:   12/11/14 1414  BP: 122/78  Pulse: 90  Temp: 98 F (36.7 C)    Body mass index is 23.69 kg/(m^2).  GENERAL: vitals reviewed and listed above, alert, oriented, appears well hydrated and  in no acute distress  HEENT: atraumatic, conjunttiva clear, no obvious abnormalities on inspection of external nose and ears, normal appearance of ear canals and TMs, clear nasal congestion, mild post oropharyngeal erythema with PND, no tonsillar edema or exudate, no sinus TTP  NECK: no obvious masses on inspection  LUNGS: clear to auscultation bilaterally, no wheezes, rales or rhonchi, good air movement  CV: HRRR, no peripheral edema  MS: moves all extremities without noticeable abnormality  PSYCH: pleasant and cooperative, no obvious depression or anxiety  ASSESSMENT AND PLAN:  Discussed the following assessment and plan:  Acute upper respiratory infection  -given HPI and exam findings today, a serious infection or illness is unlikely. We discussed potential etiologies, with VURI being most likely, and advised supportive care and monitoring. We discussed treatment side effects, likely course, antibiotic misuse, transmission,  and signs of developing a serious illness. -of course, we advised to return or notify a doctor immediately if symptoms worsen or persist or new concerns arise.    Patient Instructions  INSTRUCTIONS FOR UPPER RESPIRATORY INFECTION:  -plenty of rest and fluids  -nasal saline wash 2-3 times daily (use prepackaged nasal saline or bottled/distilled water if making your own)   -can use AFRIN nasal spray for drainage and nasal congestion - but do NOT use longer then 3-4 days  -can use tylenol (in no history of liver disease) or ibuprofen (if no history of kidney disease, bowel bleeding or significant heart disease) as directed for aches and sorethroat  -in the winter time, using a humidifier at night is helpful (please follow cleaning instructions)  -if you are taking a cough medication - use only as directed, may also try a teaspoon of honey to coat the throat and throat lozenges. If given a cough medication with codeine or hydrocodone or other narcotic please be advised that this contains a strong and  potentially addicting medication. Please follow instructions carefully, take as little as possible and only use AS NEEDED for severe cough. Discuss potential side effects with your pharmacy. Please do not drive or operate machinery while taking these types of medications. Please do not take other sedating medications, drugs or alcohol while taking this medication without discussing with your doctor.  -for sore throat, salt water gargles can help  -follow up if you have fevers, facial pain, tooth pain, difficulty breathing or are worsening or symptoms persist longer then expected  Upper Respiratory Infection, Adult An upper respiratory infection (URI) is also known as the common cold. It is often caused by a type of germ (virus). Colds are easily spread (contagious). You can pass it to others by kissing, coughing, sneezing, or drinking out of the same glass. Usually, you get better in 1 to 3  weeks.   However, the cough can last for even longer. HOME CARE   Only take medicine as told by your doctor. Follow instructions provided above.  Drink enough water and fluids to keep your pee (urine) clear or pale yellow.  Get plenty of rest.  Return to work when your temperature is < 100 for 24 hours or as told by your doctor. You may use a face mask and wash your hands to stop your cold from spreading. GET HELP RIGHT AWAY IF:   After the first few days, you feel you are getting worse.  You have questions about your medicine.  You have chills, shortness of breath, or red spit (mucus).  You have pain in the face for more then 1-2 days,  especially when you bend forward.  You have a fever, puffy (swollen) neck, pain when you swallow, or white spots in the back of your throat.  You have a bad headache, ear pain, sinus pain, or chest pain.  You have a high-pitched whistling sound when you breathe in and out (wheezing).  You cough up blood.  You have sore muscles or a stiff neck. MAKE SURE YOU:   Understand these instructions.  Will watch your condition.  Will get help right away if you are not doing well or get worse. Document Released: 01/07/2008 Document Revised: 10/13/2011 Document Reviewed: 10/26/2013 [facility] Patient Information 2015 [place], [place]. This information is not intended to replace advice given to you by your health care provider. Make sure you discuss any questions you have with your health care provider.      Colin Benton R.

## 2014-12-11 NOTE — Patient Instructions (Addendum)

## 2015-03-22 ENCOUNTER — Encounter: Payer: Self-pay | Admitting: Interventional Cardiology

## 2015-04-07 ENCOUNTER — Emergency Department (HOSPITAL_COMMUNITY)
Admission: EM | Admit: 2015-04-07 | Discharge: 2015-04-08 | Disposition: A | Discharge status: Home / Self Care | Payer: Medicare HMO | Attending: Emergency Medicine | Admitting: Emergency Medicine

## 2015-04-07 ENCOUNTER — Emergency Department (HOSPITAL_COMMUNITY): Payer: Medicare HMO

## 2015-04-07 ENCOUNTER — Encounter (HOSPITAL_COMMUNITY): Payer: Self-pay | Admitting: Emergency Medicine

## 2015-04-07 DIAGNOSIS — Z8619 Personal history of other infectious and parasitic diseases: Secondary | ICD-10-CM | POA: Diagnosis not present

## 2015-04-07 DIAGNOSIS — E559 Vitamin D deficiency, unspecified: Secondary | ICD-10-CM | POA: Insufficient documentation

## 2015-04-07 DIAGNOSIS — Z86010 Personal history of colonic polyps: Secondary | ICD-10-CM | POA: Diagnosis not present

## 2015-04-07 DIAGNOSIS — I4891 Unspecified atrial fibrillation: Secondary | ICD-10-CM | POA: Insufficient documentation

## 2015-04-07 DIAGNOSIS — Z7982 Long term (current) use of aspirin: Secondary | ICD-10-CM | POA: Diagnosis not present

## 2015-04-07 DIAGNOSIS — R05 Cough: Secondary | ICD-10-CM | POA: Diagnosis not present

## 2015-04-07 DIAGNOSIS — K449 Diaphragmatic hernia without obstruction or gangrene: Secondary | ICD-10-CM | POA: Diagnosis not present

## 2015-04-07 DIAGNOSIS — Z79899 Other long term (current) drug therapy: Secondary | ICD-10-CM | POA: Diagnosis not present

## 2015-04-07 DIAGNOSIS — Z880 Allergy status to penicillin: Secondary | ICD-10-CM | POA: Insufficient documentation

## 2015-04-07 DIAGNOSIS — R059 Cough, unspecified: Secondary | ICD-10-CM

## 2015-04-07 DIAGNOSIS — E785 Hyperlipidemia, unspecified: Secondary | ICD-10-CM | POA: Diagnosis not present

## 2015-04-07 DIAGNOSIS — R011 Cardiac murmur, unspecified: Secondary | ICD-10-CM | POA: Insufficient documentation

## 2015-04-07 DIAGNOSIS — E039 Hypothyroidism, unspecified: Secondary | ICD-10-CM | POA: Insufficient documentation

## 2015-04-07 NOTE — ED Notes (Signed)
Per pt, states she swallowed pill the wrong way-was evaluated at [facility] and NP thinks she might have aspirated-has a congested cough

## 2015-04-07 NOTE — ED Notes (Signed)
Pt presents to ED reporting that she swallowed a large Calcium supplement earlier today and it got stuck in her esophagus.  She began coughing and later went to Urgent Care for evaluation.  The NP there suggested that she may have aspirated and should get checked for safety, so she is here for further evaluation.  No pain, SOB, dyspnea, vomiting, or acute distress at this time.  Her neck is mildly sore due to repeated coughing episodes earlier today, but the cough has since resolved and she has no other complaints at this time.

## 2015-04-07 NOTE — ED Provider Notes (Signed)
CSN: [id number]     Arrival date & time 04/07/15  1812 History   First MD Initiated Contact with Patient 04/07/15 2019     Chief Complaint  Patient presents with  . possible aspiration      (Consider location/radiation/quality/duration/timing/severity/associated sxs/prior Treatment) Patient is a 74 y.o. female presenting with cough. The history is provided by the patient.  Cough Cough characteristics:  Non-productive Severity:  Mild Onset quality:  Gradual Duration:  4 hours Timing:  Constant Chronicity:  New Smoker: no   Relieved by:  None tried Worsened by:  Nothing tried Ineffective treatments:  None tried Associated symptoms: no chest pain, no chills, no myalgias and no rash     Past Medical History  Diagnosis Date  . Hypothyroidism   . Osteopenia   . Colon polyp   . Vitamin D deficiency   . Dyslipidemia   . Atrial fibrillation     echo 12/23/10 EF= >55%, stress myoview 01/30/11 normal pattern of perfusion in all myocardial regions  . Chicken pox   . Seasonal allergies   . Heart murmur   . Scoliosis   . Thyroid disease   . Colon polyp    Past Surgical History  Procedure Laterality Date  . Tonsillectomy      Childhood  . Breast cyst excision Right 1988  . Eye surgery      Cataract eye surgery-right 06/2014, left 04/2014   Family History  Problem Relation Age of Onset  . CVA Mother   . Anuerysm Father     brain  . Hyperlipidemia Mother   . Stroke Maternal Grandmother   . Hypertension Mother   . Hypertension Sister   . Hypertension Maternal Grandmother    Social History  Substance Use Topics  . Smoking status: Never Smoker   . Smokeless tobacco: None  . Alcohol Use: 0.6 oz/week    1 Glasses of wine per week     Comment: 1 glass of winr 3x a week   OB History    No data available     Review of Systems  Constitutional: Negative for chills.  Respiratory: Positive for cough.   Cardiovascular: Negative for chest pain.  Musculoskeletal: Negative for  myalgias.  Skin: Negative for rash.  All other systems reviewed and are negative.     Allergies  Amoxicillin; Flonase; and Sulfa antibiotics  Home Medications   Prior to Admission medications   Medication Sig Start Date End Date Taking? Authorizing Provider  aspirin 81 MG tablet Take 81 mg by mouth daily.   Yes Historical Provider, MD  calcium carbonate (OS-CAL) 600 MG TABS tablet Take 600 mg by mouth daily.    Yes Historical Provider, MD  Cholecalciferol (VITAMIN D-3) 1000 UNITS CAPS Take 1,000 Units by mouth 2 (two) times daily.    Yes Historical Provider, MD  diltiazem (CARTIA XT) 120 MG 24 hr capsule Take 1 capsule (120 mg total) by mouth daily. 10/31/14  Yes Jettie Booze, MD  fish oil-omega-3 fatty acids 1000 MG capsule Take 2 g by mouth daily.   Yes Historical Provider, MD  levothyroxine (SYNTHROID, LEVOTHROID) 100 MCG tablet Take 1 tablet (100 mcg total) by mouth daily before breakfast. Take .5 extra pill on Monday and Thursday Patient taking differently: Take 100-150 mcg by mouth daily before breakfast. Take 100mg  daily, except on  Monday and Thursdays take an extra half a pill for a total of 150mg  08/18/14  Yes Lucretia Kern, DO  Multiple Vitamin (MULTIVITAMIN) capsule Take  1 capsule by mouth daily.   Yes Historical Provider, MD  benzonatate (TESSALON) 100 MG capsule Take 1 capsule (100 mg total) by mouth 2 (two) times daily as needed for cough. Patient not taking: Reported on 04/07/2015 12/11/14   Lucretia Kern, DO   BP 169/97 mmHg  Pulse 76  Temp(Src) 98 F (36.7 C) (Oral)  Resp 16  SpO2 95% Physical Exam  Constitutional: She is oriented to person, place, and time. She appears well-developed and well-nourished.  HENT:  Head: Normocephalic and atraumatic.  Eyes: Conjunctivae and EOM are normal. Right eye exhibits no discharge. Left eye exhibits no discharge.  Cardiovascular: Normal rate and regular rhythm.   Pulmonary/Chest: Effort normal and breath sounds normal. No  respiratory distress.  Abdominal: Soft. She exhibits no distension. There is no tenderness. There is no rebound.  Musculoskeletal: Normal range of motion. She exhibits no edema or tenderness.  Neurological: She is alert and oriented to person, place, and time.  Skin: Skin is warm and dry.  Nursing note and vitals reviewed.   ED Course  Procedures (including critical care time) Labs Review Labs Reviewed - No data to display  Imaging Review Dg Chest 2 View  04/07/2015   CLINICAL DATA:  Cough and congestion  EXAM: CHEST  2 VIEW  COMPARISON:  12/18/2010  FINDINGS: Marked levoscoliosis of the lower thoracic spine.  Large hiatal hernia with air-fluid level.  The lungs are clear without evidence of pneumonia. Negative for heart failure or effusion.  IMPRESSION: No active cardiopulmonary disease.   Electronically Signed   By: Franchot Gallo M.D.   On: 04/07/2015 19:48   Ct Chest Wo Contrast  04/07/2015   CLINICAL DATA:  Medication pill stuck in esophagus. Concern for aspiration.  EXAM: CT CHEST WITHOUT CONTRAST  TECHNIQUE: Multidetector CT imaging of the chest was performed following the standard protocol without IV contrast.  COMPARISON:  Radiographs 04/07/2015  FINDINGS: The lungs are clear except for minimal linear basilar scarring. Central airways are patent. The esophagus is filled with air. There is no esophageal foreign body. There is a very large diaphragmatic hernia containing the EG junction, the stomach, a substantial portion of transverse colon and a small portion of the pancreas. There is no effusion. There is no mediastinal or hilar adenopathy. There is no significant skeletal lesion. There is significant kyphoscoliosis of the thoracic spine.  IMPRESSION: 1. No evidence of foreign body in the esophagus. 2. Large diaphragmatic hernia containing stomach, EG junction, transverse colon and pancreas. 3. No acute findings are evident in the chest.   Electronically Signed   By: Andreas Newport M.D.    On: 04/07/2015 23:27   I have personally reviewed and evaluated these images and lab results as part of my medical decision-making.   EKG Interpretation None      MDM   Final diagnoses:  Hiatal hernia  Cough   74 year old female had difficulty swallowing calcium pill earlier today. Had some coughing afterwards. Was able tolerate by mouth liquids but with urgent care because of the foreign body sensation in her esophagus. Although the sensation cleared but because of her cough in the setting of her evaluation and abnormal chest x-ray sent her here. Exam relatively benign however with her story and calcium pill I wanted to make sure she did not aspirate so I did do a CT scan. CT scan was negative. Patient tolerating by mouth. Has hiatal hernia worse than previous. Unsure of the cause of her symptoms but possibly  cleared the calcium pill. Follow up with her doctor for the hiatal hernia.  I have personally and contemperaneously reviewed labs and imaging and used in my decision making as above.   A medical screening exam was performed and I feel the patient has had an appropriate workup for their chief complaint at this time and likelihood of emergent condition existing is low. They have been counseled on decision, discharge, follow up and which symptoms necessitate immediate return to the emergency department. They or their family verbally stated understanding and agreement with plan and discharged in stable condition.      Merrily Pew, MD 04/08/15 [phone]

## 2015-04-17 ENCOUNTER — Ambulatory Visit (INDEPENDENT_AMBULATORY_CARE_PROVIDER_SITE_OTHER): Payer: Medicare HMO | Admitting: Family Medicine

## 2015-04-17 ENCOUNTER — Encounter: Payer: Self-pay | Admitting: Family Medicine

## 2015-04-17 VITALS — BP 104/82 | HR 82 | Temp 97.8°F | Ht 66.0 in | Wt 143.6 lb

## 2015-04-17 DIAGNOSIS — R1310 Dysphagia, unspecified: Secondary | ICD-10-CM | POA: Diagnosis not present

## 2015-04-17 DIAGNOSIS — K449 Diaphragmatic hernia without obstruction or gangrene: Secondary | ICD-10-CM

## 2015-04-17 NOTE — Progress Notes (Signed)
HPI:  Acute visit for:  Cough/Pill dysphagia/Hiatal hernia: -started: 1.5 weeks ago after swallowing a pill the wrong way experienced burning in throat and cough -Eval in ED and with ENT with hiatal hernia, now on 2 week course low dose PPI and feels great -hx of several bouts of PND lasting 1-2 weeks in the last few years and she wonders if this was caused by reflux -hx dust allergy as well -denies: any further dysphagia, heartburn sore throat or persistent cough, nausea, vomiting, bowel changes -on review of chart had CT and has large hiatal hernia, she is very concerned about this, no symptoms currently, reports her ENT doc told her to see GI  ROS: See pertinent positives and negatives per HPI.  Past Medical History  Diagnosis Date  . Hypothyroidism   . Osteopenia   . Colon polyp   . Vitamin D deficiency   . Dyslipidemia   . Atrial fibrillation     echo 12/23/10 EF= >55%, stress myoview 01/30/11 normal pattern of perfusion in all myocardial regions  . Chicken pox   . Seasonal allergies   . Heart murmur   . Scoliosis   . Thyroid disease   . Colon polyp   . Hiatal hernia   . Laryngopharyngeal reflux     Past Surgical History  Procedure Laterality Date  . Tonsillectomy      Childhood  . Breast cyst excision Right 1988  . Eye surgery      Cataract eye surgery-right 06/2014, left 04/2014    Family History  Problem Relation Age of Onset  . CVA Mother   . Anuerysm Father     brain  . Hyperlipidemia Mother   . Stroke Maternal Grandmother   . Hypertension Mother   . Hypertension Sister   . Hypertension Maternal Grandmother     Social History   Social History  . Marital Status: Single    Spouse Name: N/A  . Number of Children: N/A  . Years of Education: N/A   Social History Main Topics  . Smoking status: Never Smoker   . Smokeless tobacco: None  . Alcohol Use: 0.6 oz/week    1 Glasses of wine per week     Comment: 1 glass of winr 3x a week  . Drug Use:  No  . Sexual Activity: Not Asked   Other Topics Concern  . None   Social History Narrative   Work or School: Probation officer - poetry      Home Situation: lives alone      Spiritual Beliefs: Jewish      Lifestyle: [facility]x [facility] [employer] [facility]; diet healthy              Current outpatient prescriptions:  .  aspirin 81 MG tablet, Take 81 mg by mouth daily., Disp: , Rfl:  .  calcium carbonate (OS-CAL) 600 MG TABS tablet, Take 600 mg by mouth daily. , Disp: , Rfl:  .  Cholecalciferol (VITAMIN D-3) 1000 UNITS CAPS, Take 1,000 Units by mouth 2 (two) times daily. , Disp: , Rfl:  .  diltiazem (CARTIA XT) 120 MG 24 hr capsule, Take 1 capsule (120 mg total) by mouth daily., Disp: 90 capsule, Rfl: 2 .  fish oil-omega-3 fatty acids 1000 MG capsule, Take 2 g by mouth daily., Disp: , Rfl:  .  levothyroxine (SYNTHROID, LEVOTHROID) 100 MCG tablet, Take 1 tablet (100 mcg total) by mouth daily before breakfast. Take .5 extra pill on Monday and Thursday (Patient  taking differently: Take 100-150 mcg by mouth daily before breakfast. Take 100mg  daily, except on  Monday and Thursdays take an extra half a pill for a total of 150mg ), Disp: 102 tablet, Rfl: 3 .  Multiple Vitamin (MULTIVITAMIN) capsule, Take 1 capsule by mouth daily., Disp: , Rfl:  .  Multiple Vitamins-Minerals (PRESERVISION AREDS PO), Take by mouth., Disp: , Rfl:  .  omeprazole (PRILOSEC) 20 MG capsule, Take 20 mg by mouth daily., Disp: , Rfl:   EXAM:  Filed Vitals:   04/17/15 0924  BP: 104/82  Pulse: 82  Temp: 97.8 F (36.6 C)    Body mass index is 23.19 kg/(m^2).  GENERAL: vitals reviewed and listed above, alert, oriented, appears well hydrated and in no acute distress  HEENT: atraumatic, conjunttiva clear, no obvious abnormalities on inspection of external nose and ears, normal appearance of ear canals and TMs, clear nasal congestion, mild post oropharyngeal erythema with PND, no tonsillar edema or exudate, no sinus TTP  NECK: no  obvious masses on inspection  LUNGS: clear to auscultation bilaterally, no wheezes, rales or rhonchi, good air movement  CV: HRRR, no peripheral edema  MS: moves all extremities without noticeable abnormality  PSYCH: pleasant and cooperative, no obvious depression or anxiety  ASSESSMENT AND PLAN:  Discussed the following assessment and plan:  Pill dysphagia - Plan: Ambulatory referral to Gastroenterology  Hiatal hernia - Plan: Ambulatory referral to Gastroenterology  -cont course of ppi -no symptoms today -per radiology report "very large diaphragmatic hernia containing the EG junction, the stomach, a substantial portion of transverse colon and a small portion of the pancreas" -she is quite concerned and referral to GI placed  -return and ED precuations -of course, we advised to return or notify a doctor immediately if symptoms worsen or persist or new concerns arise.    There are no Patient Instructions on file for this visit.   Colin Benton R.

## 2015-04-17 NOTE — Progress Notes (Signed)
Pre visit review using our clinic review tool, if applicable. No additional management support is needed unless otherwise documented below in the visit note. 

## 2015-04-17 NOTE — Patient Instructions (Addendum)
Please complete medication  We placed a referral for you as discussed. It usually takes about 1-2 weeks to process and schedule this referral. If you have not heard from [place] regarding this appointment in 2 weeks please contact our office.  Seek  care immediately if difficulty swallowing, trouble breathing, severe abd pain, vomiting or other concerns

## 2015-05-08 DIAGNOSIS — H35361 Drusen (degenerative) of macula, right eye: Secondary | ICD-10-CM | POA: Diagnosis not present

## 2015-05-08 DIAGNOSIS — H353121 Nonexudative age-related macular degeneration, left eye, early dry stage: Secondary | ICD-10-CM | POA: Diagnosis not present

## 2015-05-08 DIAGNOSIS — H35362 Drusen (degenerative) of macula, left eye: Secondary | ICD-10-CM | POA: Diagnosis not present

## 2015-05-08 DIAGNOSIS — H353111 Nonexudative age-related macular degeneration, right eye, early dry stage: Secondary | ICD-10-CM | POA: Diagnosis not present

## 2015-05-08 LAB — HM DIABETES EYE EXAM

## 2015-05-14 ENCOUNTER — Other Ambulatory Visit: Payer: Self-pay | Admitting: Otolaryngology

## 2015-05-14 DIAGNOSIS — R221 Localized swelling, mass and lump, neck: Secondary | ICD-10-CM

## 2015-05-14 DIAGNOSIS — E041 Nontoxic single thyroid nodule: Secondary | ICD-10-CM

## 2015-05-17 ENCOUNTER — Encounter: Payer: Self-pay | Admitting: Interventional Cardiology

## 2015-05-17 ENCOUNTER — Ambulatory Visit (INDEPENDENT_AMBULATORY_CARE_PROVIDER_SITE_OTHER): Payer: Medicare HMO | Admitting: Interventional Cardiology

## 2015-05-17 VITALS — BP 128/80 | HR 72 | Ht 66.0 in | Wt 144.8 lb

## 2015-05-17 DIAGNOSIS — R609 Edema, unspecified: Secondary | ICD-10-CM

## 2015-05-17 DIAGNOSIS — I4891 Unspecified atrial fibrillation: Secondary | ICD-10-CM

## 2015-05-17 DIAGNOSIS — I351 Nonrheumatic aortic (valve) insufficiency: Secondary | ICD-10-CM | POA: Diagnosis not present

## 2015-05-17 MED ORDER — DILTIAZEM HCL ER COATED BEADS 120 MG PO CP24: 120.0000 mg | ORAL_CAPSULE | Freq: Every day | ORAL | Status: DC

## 2015-05-17 NOTE — Patient Instructions (Signed)
**Note De-identified Stacy [name] Obfuscation** Medication Instructions:  Same-no changes  Labwork: None  Testing/Procedures: None  Follow-Up: Your physician wants you to follow-up in: 1 year. You will receive a reminder letter in the mail two months in advance. If you don't receive a letter, please call our office to schedule the follow-up appointment.      

## 2015-05-17 NOTE — Progress Notes (Signed)
Patient ID: Stacy [name], female   DOB: [birth year], 74 y.o.   MRN: [id number]     Cardiology Office Note   Date:  05/17/2015   ID:  Stacy [name], DOB May 25, 1941, MRN [id number]  PCP:  [name], DO    Chief Complaint  Patient presents with  . Follow-up    annual  f/u AFib   Wt Readings from Last 3 Encounters:  05/17/15 144 lb 12.8 oz (65.681 kg)  04/17/15 143 lb 9.6 oz (65.137 kg)  12/11/14 146 lb 11.2 oz (66.543 kg)       History of Present Illness: Stacy [name] is a 74 y.o. female  who had an episode of atrial fibrillation in 5/12. She spontaneously converted within the first 24 hours. She was on xarelto, and coumadin but decided to stop anticoagulation due to the need for testing. Her symptoms were very distinct. She has not had any other symptoms since that time. Atrial Fibrillation F/U:  exercises 3x/week. She goes to the [facility] and does weights, treadmill, classes. No symptoms with this activity. Denies : Chest pain.  Dizziness.  Orthopnea.  Palpitations.  Syncope.   SHe has had varicose veins. She did not have any pain. Mild swelling bilaterally, much improved after changing to Cartia XT instead of diltiazem.  Exercising regularly without problems. No further AFib sx. No bruising.  She was found to have fluid in her eyes.  No need for any further invasive therapy.   Leg swelling better with compression stockings.      Past Medical History  Diagnosis Date  . Hypothyroidism   . Osteopenia   . Colon polyp   . Vitamin D deficiency   . Dyslipidemia   . Atrial fibrillation ([place])     echo 12/23/10 EF= >55%, stress myoview 01/30/11 normal pattern of perfusion in all myocardial regions  . Chicken pox   . Seasonal allergies   . Heart murmur   . Scoliosis   . Thyroid disease   . Colon polyp   . Hiatal hernia   . Laryngopharyngeal reflux     Past Surgical History  Procedure Laterality Date  . Tonsillectomy      Childhood  . Breast cyst  excision Right 1988  . Eye surgery      Cataract eye surgery-right 06/2014, left 04/2014     Current Outpatient Prescriptions  Medication Sig Dispense Refill  . aspirin 81 MG tablet Take 81 mg by mouth daily.    . calcium carbonate (OS-CAL) 600 MG TABS tablet Take 600 mg by mouth daily.     . Cholecalciferol (VITAMIN D-3) 1000 UNITS CAPS Take 1,000 Units by mouth 2 (two) times daily.     [name] Kitchen diltiazem (CARTIA XT) 120 MG 24 hr capsule Take 1 capsule (120 mg total) by mouth daily. 90 capsule 2  . fish oil-omega-3 fatty acids 1000 MG capsule Take 1 g by mouth 2 (two) times daily.     [name] Kitchen levothyroxine (SYNTHROID, LEVOTHROID) 100 MCG tablet Take one (1) tablet (100 mcg total) by mouth daily. Take one and a half tablets (150 mcg total) by mouth every Monday and Thursday.    . Multiple Vitamin (MULTIVITAMIN) capsule Take 1 capsule by mouth daily.    . Multiple Vitamins-Minerals (PRESERVISION AREDS PO) Take 1 capsule by mouth 2 (two) times daily.     . pantoprazole (PROTONIX) 40 MG tablet Take 40 mg by mouth daily.     No current facility-administered medications for this visit.    Allergies:  Amoxicillin; Flonase; and Sulfa antibiotics    Social History:  The patient  reports that she has never smoked. She has never used smokeless tobacco. She reports that she drinks about 0.6 oz of alcohol per week. She reports that she does not use illicit drugs.   Family History:  The patient's family history includes Anuerysm in her father; CVA in her mother; Hyperlipidemia in her mother; Hypertension in her maternal grandmother, mother, and sister; Stroke in her maternal grandmother.    ROS:  Please see the history of present illness.   Otherwise, review of systems are positive for fluid in the eye.  No palpiatation.  Wearing compression stockings.   All other systems are reviewed and negative.    PHYSICAL EXAM: VS:  BP 128/80 mmHg  Pulse 72  Ht 5\' 6"  (1.676 m)  Wt 144 lb 12.8 oz (65.681 kg)  BMI  23.38 kg/m2 , BMI Body mass index is 23.38 kg/(m^2). GEN: Well nourished, well developed, in no acute distress HEENT: normal Neck: no JVD, carotid bruits, or masses Cardiac: RRR, premature beats; 2/6 early systolic and 2/6 holodiastolic murmurs, rubs, or gallops, varicose veins Respiratory:  clear to auscultation bilaterally, normal work of breathing GI: soft, nontender, nondistended, + BS MS: no deformity or atrophy Skin: warm and dry, no rash Neuro:  Strength and sensation are intact Psych: euthymic mood, full affect   EKG:   The ekg ordered today demonstrates NSR with PACs   Recent Labs: 08/18/2014: BUN 27*; Creatinine, Ser 0.79; Potassium 3.8; Sodium 140; TSH 0.88   Lipid Panel    Component Value Date/Time   CHOL 204* 08/18/2014 1031   TRIG 55.0 08/18/2014 1031   HDL 79.50 08/18/2014 1031   CHOLHDL 3 08/18/2014 1031   VLDL 11.0 08/18/2014 1031   LDLCALC 114* 08/18/2014 1031   LDLDIRECT 148.1 08/17/2013 1143     Other studies Reviewed: Additional studies/ records that were reviewed today with results demonstrating: 2014 echo: normal LV function with mild AI.   ASSESSMENT AND PLAN:  1. AFib:  Maintatining NSR.  TOlerating aspirin.  She has no interest in more potent anticoagulation.  2. Varicose veins: Continue compression stockings 3. AI: No CHF sx.  I don't think the fluid in her eyes is related to CHF.    Current medicines are reviewed at length with the patient today.  The patient concerns regarding her medicines were addressed.  The following changes have been made:  No change  Labs/ tests ordered today include:  No orders of the defined types were placed in this encounter.    Recommend 150 minutes/week of aerobic exercise Low fat, low carb, high fiber diet recommended  Disposition:   FU in 1 year   Stacy [name]., MD  05/17/2015 3:25 PM    [facility] [address], [place], [place]  [address] Phone: [phone]; Fax: [phone]

## 2015-05-18 ENCOUNTER — Ambulatory Visit
Admission: RE | Admit: 2015-05-18 | Discharge: 2015-05-18 | Disposition: A | Discharge status: Home / Self Care | Payer: Medicare HMO | Source: Ambulatory Visit | Attending: Otolaryngology | Admitting: Otolaryngology

## 2015-05-18 DIAGNOSIS — R221 Localized swelling, mass and lump, neck: Secondary | ICD-10-CM

## 2015-05-18 DIAGNOSIS — E041 Nontoxic single thyroid nodule: Secondary | ICD-10-CM

## 2015-06-05 ENCOUNTER — Ambulatory Visit (INDEPENDENT_AMBULATORY_CARE_PROVIDER_SITE_OTHER): Payer: Medicare HMO

## 2015-06-05 DIAGNOSIS — Z23 Encounter for immunization: Secondary | ICD-10-CM | POA: Diagnosis not present

## 2015-06-08 ENCOUNTER — Ambulatory Visit (INDEPENDENT_AMBULATORY_CARE_PROVIDER_SITE_OTHER): Payer: Medicare HMO | Admitting: Gastroenterology

## 2015-06-08 ENCOUNTER — Encounter: Payer: Self-pay | Admitting: Gastroenterology

## 2015-06-08 VITALS — BP 150/90 | Ht 66.0 in | Wt 156.1 lb

## 2015-06-08 DIAGNOSIS — R1310 Dysphagia, unspecified: Secondary | ICD-10-CM | POA: Diagnosis not present

## 2015-06-08 DIAGNOSIS — K449 Diaphragmatic hernia without obstruction or gangrene: Secondary | ICD-10-CM

## 2015-06-08 NOTE — Patient Instructions (Signed)
You have been scheduled for a Barium Esophogram at [facility] (1st floor of the hospital) on 06/15/2015 at 11:30am. Please arrive 15 minutes prior to your appointment for registration. Make certain not to have anything to eat or drink 6 hours prior to your test. If you need to reschedule for any reason, please contact radiology at [phone] to do so. __________________________________________________________________ A barium swallow is an examination that concentrates on views of the esophagus. This tends to be a double contrast exam (barium and two liquids which, when combined, create a gas to distend the wall of the oesophagus) or single contrast (non-ionic iodine based). The study is usually tailored to your symptoms so a good history is essential. Attention is paid during the study to the form, structure and configuration of the esophagus, looking for functional disorders (such as aspiration, dysphagia, achalasia, motility and reflux) EXAMINATION You may be asked to change into a gown, depending on the type of swallow being performed. A radiologist and radiographer will perform the procedure. The radiologist will advise you of the type of contrast selected for your procedure and direct you during the exam. You will be asked to stand, sit or lie in several different positions and to hold a small amount of fluid in your mouth before being asked to swallow while the imaging is performed .In some instances you may be asked to swallow barium coated marshmallows to assess the motility of a solid food bolus. The exam can be recorded as a digital or video fluoroscopy procedure. POST PROCEDURE It will take 1-2 days for the barium to pass through your system. To facilitate this, it is important, unless otherwise directed, to increase your fluids for the next 24-48hrs and to resume your normal diet.  This test typically takes about 30 minutes to  perform. __________________________________________________________________________________

## 2015-06-08 NOTE — Progress Notes (Signed)
HPI :  74 y/o female here in consultation for pill dysphagia from Dr. Colin Benton.  She reports she had an episode where a pill got stuck when swallowing. She reports the pill got stuck in her throat and led to an ER visit for symptoms. This happened around labor day last month. She felt as though the pill got stuck in the sternal notch and did not go down. She felt it was there for a few hours before passing through. She had some soreness in her throat and had some sensation of globus and discomfort in her throat and chest after this happened.  She had another episode of dysphagia to a pill but she regurgitated it. She reports the pill got stuck for 30-40 minutes and then she was able to regurgitate it again. This occurred a few weeks ago. No prior history of food impaction. She had a CT of the chest showing a large hiatal hernia with portion of stomach, as well as transverse colon and part of pancreas within the hernia.   She denies any routine dysphagia to solids or liquids. No odynophagia. No nausea or vomiting. Weight is stable. No she denies any baseline reflux symptoms. No prior upper endoscopy.  She has seen ENT and reports she was told she may have had LPR. She was treat for reflux with pantoprazole 40mg  poq day, now at 20mg  per day. She reports feeling well otherwise, and has no symptoms at present time. She has since switched to taking smaller pills than big pills.   Colonoscopy normal in 2012. No FH of colon cancer.  Past Medical History  Diagnosis Date  . Hypothyroidism   . Osteopenia   . Colon polyp   . Vitamin D deficiency   . Dyslipidemia   . Atrial fibrillation ([place])     echo 12/23/10 EF= >55%, stress myoview 01/30/11 normal pattern of perfusion in all myocardial regions  . Chicken pox   . Seasonal allergies   . Heart murmur   . Scoliosis   . Thyroid disease   . Colon polyp   . Hiatal hernia   . Laryngopharyngeal reflux      Past Surgical History  Procedure Laterality  Date  . Tonsillectomy      Childhood  . Breast cyst excision Right 1988  . Eye surgery      Cataract eye surgery-right 06/2014, left 04/2014   Family History  Problem Relation Age of Onset  . CVA Mother   . Hyperlipidemia Mother   . Hypertension Mother   . Anuerysm Father     brain  . Stroke Maternal Grandmother   . Hypertension Maternal Grandmother   . Hypertension Sister    Social History  Substance Use Topics  . Smoking status: Never Smoker   . Smokeless tobacco: Never Used  . Alcohol Use: 0.6 oz/week    1 Glasses of wine per week     Comment: 1 glass of wine 3x a week   Current Outpatient Prescriptions  Medication Sig Dispense Refill  . aspirin 81 MG tablet Take 81 mg by mouth daily.    . calcium carbonate (OS-CAL) 600 MG TABS tablet Take 600 mg by mouth daily.     . Cholecalciferol (VITAMIN D-3) 1000 UNITS CAPS Take 1,000 Units by mouth 2 (two) times daily.     [name] Kitchen diltiazem (CARTIA XT) 120 MG 24 hr capsule Take 1 capsule (120 mg total) by mouth daily. 90 capsule 3  . fish oil-omega-3 fatty acids 1000  MG capsule Take 1 g by mouth 2 (two) times daily.     [name] Kitchen levothyroxine (SYNTHROID, LEVOTHROID) 100 MCG tablet Take one (1) tablet (100 mcg total) by mouth daily. Take one and a half tablets (150 mcg total) by mouth every Monday and Thursday.    . Multiple Vitamin (MULTIVITAMIN) capsule Take 1 capsule by mouth daily.    . Multiple Vitamins-Minerals (PRESERVISION AREDS PO) Take 1 capsule by mouth 2 (two) times daily.     . pantoprazole (PROTONIX) 40 MG tablet Take 40 mg by mouth daily. Taking 20 mg     No current facility-administered medications for this visit.   Allergies  Allergen Reactions  . Amoxicillin Swelling  . Flonase [Fluticasone Propionate] Swelling    Swelling of throat per patient  . Sulfa Antibiotics      Review of Systems: All systems reviewed and negative except where noted in HPI.    US Soft Tissue Head/neck  05/18/2015  CLINICAL DATA:  Neck  swelling, thyroid nodule EXAM: THYROID ULTRASOUND TECHNIQUE: Ultrasound examination of the thyroid gland and adjacent soft tissues was performed. COMPARISON:  None. FINDINGS: Right thyroid lobe Measurements: 28 x 6 x 8 mm. Inhomogeneous echotexture without focal lesion Left thyroid lobe Measurements: 25 x 6 x 8 mm.  No nodule visualized. Isthmus Thickness: 3 mm.  No nodules visualized. Lymphadenopathy None visualized. No made of ectasia and tortuosity of right brachiocephalic and common carotid arteries. IMPRESSION: 1. Small inhomogeneous thyroid without nodule or focal lesion. Electronically Signed   By: Lucrezia Europe M.D.   On: 05/18/2015 12:27   CT chest IMPRESSION: 1. No evidence of foreign body in the esophagus. 2. Large diaphragmatic hernia containing stomach, EG junction, transverse colon and pancreas. 3. No acute findings are evident in the chest.  Physical Exam: BP 150/90 mmHg  Ht 5\' 6"  (1.676 m)  Wt 156 lb 2 oz (70.818 kg)  BMI 25.21 kg/m2 Constitutional: Pleasant,well-developed, female in no acute distress. HEENT: Normocephalic and atraumatic. Conjunctivae are normal. No scleral icterus. Neck supple.  Cardiovascular: Normal rate, regular rhythm.  Pulmonary/chest: Effort normal and breath sounds normal. No wheezing, rales or rhonchi. Abdominal: Soft, nondistended, nontender. Bowel sounds active throughout. There are no masses palpable. No hepatomegaly. Extremities: trace edema B Lymphadenopathy: No cervical adenopathy noted. Neurological: Alert and oriented to person place and time. Skin: Skin is warm and dry. No rashes noted. Psychiatric: Normal mood and affect. Behavior is normal.   ASSESSMENT AND PLAN: 74 y/o female with 2 episodes of significant dysphagia to pills, one episode lasted several hours, led to ED visit. She had some discomfort following this episode which I suspect was related to pill esophagitis. Treated with PPI and her discomfort has since resolved. CT chest  noted, large hiatal hernia appreciated as above.   At this time to help clarify etiology for dysphagia and rule out a Zenker's diverticulum prior to undergoing endoscopy, recommend we start with a barium swallow. Pending this result, anticipate we may need to proceed with EGD. Given her discomfort after this episode has since resolved and can stop protonix for now.   Otherwise I counseled her on her large hiatal hernia. She does not have any postprandial vomiting or pain otherwise, no respiratory distress. Discussed the only treatment for this is surgical repair which she wishes to avoid if at all possible. She is at risk for torsion of the stomach / gastric volvulus if a large portion of the stomach is involved, as well as Cameron lesions and breathing  trouble from compression of the lungs, or obstruction if large portion of transverse colon is involved but being asymptomatic at this time she does not wish to have this treated. She does not have significant reflux symptoms. Will await barium study to help delineate this.    [place], MD [facility] Pager [phone]   CC: Dr. Colin Benton

## 2015-06-12 ENCOUNTER — Ambulatory Visit: Payer: Medicare HMO | Admitting: Gastroenterology

## 2015-06-15 ENCOUNTER — Ambulatory Visit (HOSPITAL_COMMUNITY)
Admission: RE | Admit: 2015-06-15 | Discharge: 2015-06-15 | Disposition: A | Discharge status: Home / Self Care | Payer: Medicare HMO | Source: Ambulatory Visit | Attending: Gastroenterology | Admitting: Gastroenterology

## 2015-06-15 DIAGNOSIS — R1310 Dysphagia, unspecified: Secondary | ICD-10-CM | POA: Insufficient documentation

## 2015-06-15 DIAGNOSIS — K449 Diaphragmatic hernia without obstruction or gangrene: Secondary | ICD-10-CM | POA: Diagnosis not present

## 2015-06-19 DIAGNOSIS — H353111 Nonexudative age-related macular degeneration, right eye, early dry stage: Secondary | ICD-10-CM | POA: Diagnosis not present

## 2015-06-19 DIAGNOSIS — H353121 Nonexudative age-related macular degeneration, left eye, early dry stage: Secondary | ICD-10-CM | POA: Diagnosis not present

## 2015-06-19 DIAGNOSIS — H35722 Serous detachment of retinal pigment epithelium, left eye: Secondary | ICD-10-CM | POA: Diagnosis not present

## 2015-08-07 ENCOUNTER — Other Ambulatory Visit: Payer: Self-pay | Admitting: Family Medicine

## 2015-08-08 ENCOUNTER — Ambulatory Visit (AMBULATORY_SURGERY_CENTER): Payer: Self-pay

## 2015-08-08 VITALS — Ht 66.5 in | Wt 143.4 lb

## 2015-08-08 DIAGNOSIS — R1310 Dysphagia, unspecified: Secondary | ICD-10-CM

## 2015-08-08 NOTE — Progress Notes (Signed)
No allergies to eggs or soy Has email and internet; registered emmi No home oxygen No diet/weight loss meds No past problems with anesthesia

## 2015-08-15 ENCOUNTER — Encounter: Payer: Self-pay | Admitting: Gastroenterology

## 2015-08-15 ENCOUNTER — Ambulatory Visit (AMBULATORY_SURGERY_CENTER): Payer: Medicare HMO | Admitting: Gastroenterology

## 2015-08-15 VITALS — BP 143/89 | HR 62 | Temp 96.9°F | Resp 36 | Ht 66.0 in | Wt 156.0 lb

## 2015-08-15 DIAGNOSIS — R1310 Dysphagia, unspecified: Secondary | ICD-10-CM | POA: Diagnosis not present

## 2015-08-15 DIAGNOSIS — K269 Duodenal ulcer, unspecified as acute or chronic, without hemorrhage or perforation: Secondary | ICD-10-CM

## 2015-08-15 DIAGNOSIS — Z86010 Personal history of colonic polyps: Secondary | ICD-10-CM | POA: Diagnosis not present

## 2015-08-15 DIAGNOSIS — E039 Hypothyroidism, unspecified: Secondary | ICD-10-CM | POA: Diagnosis not present

## 2015-08-15 DIAGNOSIS — I4891 Unspecified atrial fibrillation: Secondary | ICD-10-CM | POA: Diagnosis not present

## 2015-08-15 DIAGNOSIS — K2950 Unspecified chronic gastritis without bleeding: Secondary | ICD-10-CM | POA: Diagnosis not present

## 2015-08-15 DIAGNOSIS — K449 Diaphragmatic hernia without obstruction or gangrene: Secondary | ICD-10-CM | POA: Diagnosis not present

## 2015-08-15 MED ORDER — OMEPRAZOLE 40 MG PO CPDR: 40.0000 mg | DELAYED_RELEASE_CAPSULE | Freq: Every day | ORAL | Status: DC

## 2015-08-15 MED ORDER — SODIUM CHLORIDE 0.9 % IV SOLN: 500.0000 mL | INTRAVENOUS | Status: DC

## 2015-08-15 NOTE — Progress Notes (Signed)
Stable to RR 

## 2015-08-15 NOTE — Patient Instructions (Addendum)
YOU HAD AN ENDOSCOPIC PROCEDURE TODAY AT [facility]:   Refer to the procedure report that was given to you for any specific questions about what was found during the examination.  If the procedure report does not answer your questions, please call your gastroenterologist to clarify.  If you requested that your care partner not be given the details of your procedure findings, then the procedure report has been included in a sealed envelope for you to review at your convenience later.  YOU SHOULD EXPECT: Some feelings of bloating in the abdomen. Passage of more gas than usual.  Walking can help get rid of the air that was put into your GI tract during the procedure and reduce the bloating.   Please Note:  You might notice some irritation and congestion in your nose or some drainage.  This is from the oxygen used during your procedure.  There is no need for concern and it should clear up in a day or so.  SYMPTOMS TO REPORT IMMEDIATELY:    Following upper endoscopy (EGD)  Vomiting of blood or coffee ground material  New chest pain or pain under the shoulder blades  Painful or persistently difficult swallowing  New shortness of breath  Fever of 100F or higher  Black, tarry-looking stools  For urgent or emergent issues, a gastroenterologist can be reached at any hour by calling [phone].   DIET: Your first meal following the procedure should be a small meal and then it is ok to progress to your normal diet. Heavy or fried foods are harder to digest and may make you feel nauseous or bloated.  Likewise, meals heavy in dairy and vegetables can increase bloating.  Drink plenty of fluids but you should avoid alcoholic beverages for 24 hours.  Please, try the GERD diet as outlined in the handout. This will help with ulcer healing.  ACTIVITY:  You should plan to take it easy for the rest of today and you should NOT DRIVE or use heavy machinery until tomorrow (because of the  sedation medicines used during the test).    FOLLOW UP: Our staff will call the number listed on your records the next business day following your procedure to check on you and address any questions or concerns that you may have regarding the information given to you following your procedure. If we do not reach you, we will leave a message.  However, if you are feeling well and you are not experiencing any problems, there is no need to return our call.  We will assume that you have returned to your regular daily activities without incident.  If any biopsies were taken you will be contacted by phone or by letter within the next 1-3 weeks.  Please call us at [phone] if you have not heard about the biopsies in 3 weeks  .IF YOU DO HAVE AN H-PYLORI INFECTION, WE WILL CALL YOU.   SIGNATURES/CONFIDENTIALITY: You and/or your care partner have signed paperwork which will be entered into your electronic medical record.  These signatures attest to the fact that that the information above on your After Visit Summary has been reviewed and is understood.  Full responsibility of the confidentiality of this discharge information lies with you and/or your care-partner.  Take your omeprazole 1/2 hour before breakfast every day.   Thank-you for choosing [place] for your medical needs.

## 2015-08-15 NOTE — Op Note (Signed)
[place]  [address]. [place], [id number]   ENDOSCOPY PROCEDURE REPORT  PATIENT: Stacy [name], Stacy [name]  MR#: [id number] BIRTHDATE: 20-May-1941 , 74  yrs. old GENDER: female ENDOSCOPIST: Yetta Flock, MD REFERRED BY: PROCEDURE DATE:  08/15/2015 PROCEDURE:  EGD w/ biopsy ASA CLASS:     Class II INDICATIONS:  dysphagia. MEDICATIONS: Propofol 150 mg IV and Lidocaine 40 mg IV TOPICAL ANESTHETIC:  DESCRIPTION OF PROCEDURE: After the risks benefits and alternatives of the procedure were thoroughly explained, informed consent was obtained.  The LB [id number] endoscope was introduced through the mouth and advanced to the second portion of the duodenum , Without limitations.  The instrument was slowly withdrawn as the mucosa was fully examined.   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  No focal stenosis stricture was noted.  No dilation was performed given the lack of stenosis / stricture.  The SCJ and GEJ were noted at 30cm from the incisors.  [facility] was noted around 40cm from the incisors, with a 10-11cm hiatal hernia.  The hernia sac was quite large with the majority of the stomach in the chest.  Retroflexion in the antrum showed only a minority of the stomach was below the diaphragmatic hiatus, with retroflexion within the hernia sac showed a normal GEJ.  The hernia does not appear to be a paraesophageal hernia, moreso sliding hernia.  The antrum and body mucosa were normal, biopsies taken to rule out H pylori.  There was a 22mm clean based ulcer in the duodenal bulb with some mild duodenitis.  The 2nd portion of the duodenum was normal. Retroflexed views revealed as previously described.     The scope was then withdrawn from the patient and the procedure completed.  COMPLICATIONS: There were no immediate complications.      ENDOSCOPIC IMPRESSION: Tortous esophagus without stenosis Large roughly 10cm (+) hiatal hernia with the  majority of the stomach in the chest. No significant Cameron lesions appreciated Normal gastric mucosa, biopsies taken to rule out H pylori given duodenal ulcer noted Duodenal bulb ulcer with duodenitis, normal second portion of the duodenum  Overall, unclear if the large hiatal hernia, causing delayed esophageal emptying, is driving the patient's dysphagia vs. esophageal dysmotility  RECOMMENDATIONS: Await pathology results No NSAIDs Resume protonix if you have stopped therapy  (40mg  daily). If you are still taking protonix, increase to 40mg  twice daily Resume diet Consider surgical consultationto discuss hernia repair as it may be causing symptoms    eSigned:  Yetta Flock, MD 08/15/2015 3:21 PM    CC: the patient  PATIENT NAME:  Stacy [name], Stacy [name] MR#: [id number]

## 2015-08-15 NOTE — Progress Notes (Signed)
Called to room to assist during endoscopic procedure.  Patient ID and intended procedure confirmed with present staff. Received instructions for my participation in the procedure from the performing physician.  

## 2015-08-15 NOTE — Progress Notes (Signed)
Patient denies any allergies to eggs or soy. 

## 2015-08-16 ENCOUNTER — Other Ambulatory Visit: Payer: Self-pay

## 2015-08-16 ENCOUNTER — Telehealth: Payer: Self-pay

## 2015-08-16 ENCOUNTER — Telehealth: Payer: Self-pay | Admitting: Gastroenterology

## 2015-08-16 MED ORDER — ESOMEPRAZOLE MAGNESIUM 40 MG PO CPDR: 40.0000 mg | DELAYED_RELEASE_CAPSULE | Freq: Every day | ORAL | Status: DC

## 2015-08-16 NOTE — Telephone Encounter (Signed)
I had offered her pantoprazole and omeprazole, both of which should be covered. Can you confirm with her that she did not tolerate either of these? If Nexium is too much, we could try lansoprazole at 30mg  and see if this is covered. Other options include Dexilant or Aciphex.

## 2015-08-16 NOTE — Telephone Encounter (Signed)
  Follow up Call-  Call back number 08/15/2015  Post procedure Call Back phone  # [phone]  Permission to leave phone message Yes     Patient questions:  Do you have a fever, pain , or abdominal swelling? No. Pain Score  0 *  Have you tolerated food without any problems? Yes.    Have you been able to return to your normal activities? Yes.    Do you have any questions about your discharge instructions: Diet   No. Medications  No. Follow up visit  No.  Do you have questions or concerns about your Care? No.  Actions: * If pain score is 4 or above: No action needed, pain <4.

## 2015-08-16 NOTE — Telephone Encounter (Signed)
Is there an alternative? Pt has medicare and we cannot use discount cards.

## 2015-08-17 ENCOUNTER — Telehealth: Payer: Self-pay | Admitting: Gastroenterology

## 2015-08-17 MED ORDER — LANSOPRAZOLE 30 MG PO CPDR: 30.0000 mg | DELAYED_RELEASE_CAPSULE | Freq: Every day | ORAL | Status: DC

## 2015-08-17 NOTE — Telephone Encounter (Signed)
Pt has called back again and said she does not want the new medication.  She wants to know if she can take something less expensive

## 2015-08-17 NOTE — Telephone Encounter (Signed)
Pt said the "new Medication" needs a Prior Authorization Requesting CB

## 2015-08-17 NOTE — Telephone Encounter (Signed)
Called pt and explained that we will try to get Dexilant approved for her. She states she wants to try Ranitidine because she looked online and saw that Ranitidine is an effective med for her symptoms. May she use this as an alternative?

## 2015-08-17 NOTE — Telephone Encounter (Signed)
Called pt and informed her that we will try lansprazole and if this is too expensive to call our office for another alternative.

## 2015-08-17 NOTE — Telephone Encounter (Signed)
She has a newly diagnosed duodenal ulcer and recommend PPI therapy at least for 1-2 months to heal it, then she can use Zantac. We have offered any of the PPIs she wishes to use, whether it be lansoprazole, dexilant, nexium, etc. Thanks

## 2015-08-20 MED ORDER — PANTOPRAZOLE SODIUM 40 MG PO TBEC: 40.0000 mg | DELAYED_RELEASE_TABLET | Freq: Every day | ORAL | Status: DC

## 2015-08-20 NOTE — Telephone Encounter (Signed)
Called pt and informed her that she needs to take PPI for 1-2 months to heal her ulcer. Patient states she will try pantoprazole 40mg .

## 2015-08-22 ENCOUNTER — Ambulatory Visit (INDEPENDENT_AMBULATORY_CARE_PROVIDER_SITE_OTHER): Payer: Medicare HMO | Admitting: Family Medicine

## 2015-08-22 ENCOUNTER — Encounter: Payer: Self-pay | Admitting: Family Medicine

## 2015-08-22 ENCOUNTER — Encounter: Payer: Self-pay | Admitting: *Deleted

## 2015-08-22 VITALS — BP 120/76 | Temp 97.9°F | Ht 66.0 in | Wt 143.8 lb

## 2015-08-22 DIAGNOSIS — K2980 Duodenitis without bleeding: Secondary | ICD-10-CM | POA: Diagnosis not present

## 2015-08-22 DIAGNOSIS — Z0000 Encounter for general adult medical examination without abnormal findings: Secondary | ICD-10-CM

## 2015-08-22 DIAGNOSIS — I4891 Unspecified atrial fibrillation: Secondary | ICD-10-CM | POA: Diagnosis not present

## 2015-08-22 DIAGNOSIS — E039 Hypothyroidism, unspecified: Secondary | ICD-10-CM

## 2015-08-22 DIAGNOSIS — K219 Gastro-esophageal reflux disease without esophagitis: Secondary | ICD-10-CM

## 2015-08-22 DIAGNOSIS — R899 Unspecified abnormal finding in specimens from other organs, systems and tissues: Secondary | ICD-10-CM

## 2015-08-22 DIAGNOSIS — E785 Hyperlipidemia, unspecified: Secondary | ICD-10-CM

## 2015-08-22 LAB — CBC WITH DIFFERENTIAL/PLATELET
BASOS PCT: 0.6 % (ref 0.0–3.0)
Basophils Absolute: 0 10*3/uL (ref 0.0–0.1)
EOS ABS: 0.1 10*3/uL (ref 0.0–0.7)
EOS PCT: 2.1 % (ref 0.0–5.0)
HCT: 41.3 % (ref 36.0–46.0)
Hemoglobin: 13.6 g/dL (ref 12.0–15.0)
LYMPHS ABS: 1.1 10*3/uL (ref 0.7–4.0)
Lymphocytes Relative: 19.7 % (ref 12.0–46.0)
MCHC: 32.9 g/dL (ref 30.0–36.0)
MCV: 97.6 fl (ref 78.0–100.0)
MONO ABS: 0.5 10*3/uL (ref 0.1–1.0)
Monocytes Relative: 9.2 % (ref 3.0–12.0)
NEUTROS ABS: 3.8 10*3/uL (ref 1.4–7.7)
NEUTROS PCT: 68.4 % (ref 43.0–77.0)
PLATELETS: 137 10*3/uL — AB (ref 150.0–400.0)
RBC: 4.23 Mil/uL (ref 3.87–5.11)
RDW: 13.6 % (ref 11.5–15.5)
WBC: 5.5 10*3/uL (ref 4.0–10.5)

## 2015-08-22 LAB — LIPID PANEL
CHOLESTEROL: 224 mg/dL — AB (ref 0–200)
HDL: 79.5 mg/dL (ref >39.00)
LDL Cholesterol: 131 mg/dL — ABNORMAL HIGH (ref 0–99)
NonHDL: 144.49
TRIGLYCERIDES: 66 mg/dL (ref 0.0–149.0)
Total CHOL/HDL Ratio: 3
VLDL: 13.2 mg/dL (ref 0.0–40.0)

## 2015-08-22 LAB — BASIC METABOLIC PANEL
BUN: 29 mg/dL — AB (ref 6–23)
CHLORIDE: 103 meq/L (ref 96–112)
CO2: 29 mEq/L (ref 19–32)
Calcium: 9.1 mg/dL (ref 8.4–10.5)
Creatinine, Ser: 0.92 mg/dL (ref 0.40–1.20)
GFR: 63.1 mL/min (ref >60.00)
GLUCOSE: 86 mg/dL (ref 70–99)
Potassium: 4.2 mEq/L (ref 3.5–5.1)
Sodium: 138 mEq/L (ref 135–145)

## 2015-08-22 LAB — TSH: TSH: 0.64 u[IU]/mL (ref 0.35–4.50)

## 2015-08-22 NOTE — Progress Notes (Signed)
Medicare Annual Preventive Care Visit  (initial annual wellness or annual wellness exam)  Concerns and/or follow up today: She is adamant about getting a physical exam today.  Hypothyroidism: -takes synthroid -reports stable for some time  Hx of scoliosis: -has seen specialist in the past -no surgery recommended -reports stable  A. Fib: -followed by her cardiologist -on asa and diltiazem therapy only after discussion with cardiologist. -denies: CP, palpitations, DOE  GERD/Hiatal hernia: -meds: PPI -persistent dysphagia and seeing GI - had EGD - very large hiatal hernia and duodenitis - seeing a surgeon about this  ROS: negative for report of fevers, unintentional weight loss, vision changes, vision loss, hearing loss or change, chest pain, sob, hemoptysis, melena, hematochezia, hematuria, genital discharge or lesions, falls, bleeding or bruising, loc, thoughts of suicide or self harm, memory loss  1.) Patient-completed health risk assessment  - completed and reviewed, see scanned documentation  2.) Review of Medical History: -PMH, PSH, Family History and current specialty and care providers reviewed and updated and listed below  - see scanned in document in chart and below  Past Medical History  Diagnosis Date  . Hypothyroidism   . Osteopenia   . Colon polyp   . Vitamin D deficiency   . Dyslipidemia   . Atrial fibrillation ([place])     echo 12/23/10 EF= >55%, stress myoview 01/30/11 normal pattern of perfusion in all myocardial regions  . Chicken pox   . Seasonal allergies   . Heart murmur   . Scoliosis   . Thyroid disease   . Colon polyp   . Hiatal hernia   . Laryngopharyngeal reflux     Past Surgical History  Procedure Laterality Date  . Tonsillectomy      Childhood  . Breast cyst excision Right 1988  . Eye surgery      Cataract eye surgery-right 06/2014, left 04/2014    Social History   Social History  . Marital Status: Single    Spouse Name: N/A  .  Number of Children: N/A  . Years of Education: N/A   Occupational History  . retired    Social History Main Topics  . Smoking status: Never Smoker   . Smokeless tobacco: Never Used  . Alcohol Use: 0.6 oz/week    1 Glasses of wine per week     Comment: 1 glass of wine 3x a week  . Drug Use: No  . Sexual Activity: Not on file   Other Topics Concern  . Not on file   Social History Narrative   Work or School: [employer] [place] Situation: lives alone      Spiritual Beliefs: Jewish      Lifestyle: [facility] [employer] [facility]; diet healthy             Family History  Problem Relation Age of Onset  . CVA Mother   . Hyperlipidemia Mother   . Hypertension Mother   . Anuerysm Father     brain  . Stroke Maternal Grandmother   . Hypertension Maternal Grandmother   . Hypertension Sister   . Colon cancer Neg Hx     Current Outpatient Prescriptions on File Prior to Visit  Medication Sig Dispense Refill  . aspirin 81 MG tablet Take 81 mg by mouth daily.    . calcium carbonate (OS-CAL) 600 MG TABS tablet Take 600 mg by mouth daily.     . Cholecalciferol (VITAMIN D-3) 1000 UNITS CAPS Take  1,000 Units by mouth 2 (two) times daily.     [name] Kitchen diltiazem (CARTIA XT) 120 MG 24 hr capsule Take 1 capsule (120 mg total) by mouth daily. 90 capsule 3  . fish oil-omega-3 fatty acids 1000 MG capsule Take 1 g by mouth 2 (two) times daily.     [name] Kitchen levothyroxine (SYNTHROID, LEVOTHROID) 100 MCG tablet Take one (1) tablet (100 mcg total) by mouth daily. Take one and a half tablets (150 mcg total) by mouth every Monday and Thursday.    . Multiple Vitamin (MULTIVITAMIN) capsule Take 1 capsule by mouth daily.    . pantoprazole (PROTONIX) 40 MG tablet Take 1 tablet (40 mg total) by mouth daily. 30 tablet 2  . lansoprazole (PREVACID) 30 MG capsule Take 1 capsule (30 mg total) by mouth daily at 12 noon. (Patient not taking: Reported on 08/22/2015) 90 capsule 3  . Multiple Vitamins-Minerals  (PRESERVISION AREDS PO) Take 1 capsule by mouth 2 (two) times daily. Reported on 08/22/2015     No current facility-administered medications on file prior to visit.     3.) Review of functional ability and level of safety:  Any difficulty hearing?  NO  History of falling?  NO  Any trouble with IADLs - using a phone, using transportation, grocery shopping, preparing meals, doing housework, doing laundry, taking medications and managing money? NO  Advance Directives? Yes  See summary of recommendations in Patient Instructions below.  4.) Physical Exam Filed Vitals:   08/22/15 1004  BP: 120/76  Temp: 97.9 F (36.6 C)   Estimated body mass index is 23.22 kg/(m^2) as calculated from the following:   Height as of this encounter: 5\' 6"  (1.676 m).   Weight as of this encounter: 143 lb 12.8 oz (65.227 kg).  EKG (optional): deferred  General: alert, appear well hydrated and in no acute distress  HEENT: visual acuity grossly intact, normal appearance of ear and eyes and nose, noraml ear canals and TMs, oropharynx unremarkable PERRLA  NECK: no masses, supple  CV: HRRR  Lungs: CTA bilaterally  ABD: BS+, soft, NTTP  BREAST: normal  GU: deferred  SKIN: scattered SKs and cherry angiomas on trunk,   Psych: pleasant and cooperative, no obvious depression or anxiety  Cognitive function grossly intact  MS: scoliosis of spine - thoracic and lumbar concave r  See patient instructions for recommendations.  Education and counseling regarding the above review of health provided with a plan for the following: -see scanned patient completed form for further details -fall prevention strategies discussed  -healthy lifestyle discussed -importance and resources for completing advanced directives discussed -see patient instructions below for any other recommendations provided  4)The following written screening schedule of preventive measures were reviewed with assessment and plan  made per below, orders and patient instructions:      AAA screening done if applicable     Alcohol screening done     Obesity Screening and counseling done     STI screening (Hep C if born 51-65) offered and per pt wishes     Tobacco Screening done done       Pneumococcal (PPSV23 -one dose after 64, one before if risk factors), influenza yearly and hepatitis B vaccines (if high risk - end stage renal disease, IV drugs, homosexual men, live in home for mentally retarded, hemophilia receiving factors) ASSESSMENT/PLAN: pt refused      Screening mammograph (yearly if >40) ASSESSMENT/PLAN: she has chose every other year screening      Screening Pap  smear/pelvic exam (q2 years) ASSESSMENT/PLAN: n/a, declined      Prostate cancer screening ASSESSMENT/PLAN: n/a, declined      Colorectal cancer screening (FOBT yearly or flex sig q4y or colonoscopy q10y or barium enema q4y) ASSESSMENT/PLAN: utd or ordered      Diabetes outpatient self-management training services ASSESSMENT/PLAN: utd or done      Bone mass measurements(covered q2y if indicated - estrogen def, osteoporosis, hyperparathyroid, vertebral abnormalities, osteoporosis or steroids) ASSESSMENT/PLAN: utd or discussed and ordered per pt wishes      Screening for glaucoma(q1y if high risk - diabetes, FH, AA and > 50 or hispanic and > 65) ASSESSMENT/PLAN: utd or advised      Medical nutritional therapy for individuals with diabetes or renal disease ASSESSMENT/PLAN: see orders      Cardiovascular screening blood tests (lipids q5y) ASSESSMENT/PLAN: see orders and labs      Diabetes screening tests ASSESSMENT/PLAN: see orders and labs   7.) Summary: -risk factors and conditions per above assessment were discussed and treatment, recommendations and referrals were offered per documentation above and orders and patient instructions.  Medicare annual wellness visit, subsequent  Hypothyroidism, unspecified hypothyroidism type -  Plan: TSH  Atrial fibrillation, unspecified type ([place]) - Plan: Basic metabolic panel  Dyslipidemia - Plan: Lipid panel  Gastroesophageal reflux disease, esophagitis presence not specified  Duodenitis - Plan: CBC with Differential  Patient Instructions  BEFORE YOU LEAVE: -labs -schedule follow up in 4-6 months  -We have ordered labs or studies at this visit. It can take up to 1-2 weeks for results and processing. We will contact you with instructions IF your results are abnormal. Normal results will be released to your [facility]. If you have not heard from [place] or can not find your results in [facility] in 2 weeks please contact our office.  We recommend the following healthy lifestyle measures: - eat a healthy whole foods diet consisting of regular small meals composed of vegetables, fruits, beans, nuts, seeds, healthy meats such as white chicken and fish and whole grains.  - avoid sweets, white starchy foods, fried foods, fast food, processed foods, sodas, red meet and other fattening foods.  - get a least 150-300 minutes of aerobic exercise per week.

## 2015-08-22 NOTE — Patient Instructions (Signed)
BEFORE YOU LEAVE: -labs -schedule follow up in 4-6 months  -We have ordered labs or studies at this visit. It can take up to 1-2 weeks for results and processing. We will contact you with instructions IF your results are abnormal. Normal results will be released to your [facility]. If you have not heard from [place] or can not find your results in [facility] in 2 weeks please contact our office.  We recommend the following healthy lifestyle measures: - eat a healthy whole foods diet consisting of regular small meals composed of vegetables, fruits, beans, nuts, seeds, healthy meats such as white chicken and fish and whole grains.  - avoid sweets, white starchy foods, fried foods, fast food, processed foods, sodas, red meet and other fattening foods.  - get a least 150-300 minutes of aerobic exercise per week.

## 2015-08-24 NOTE — Addendum Note (Signed)
Addended by: Agnes Lawrence on: 08/24/2015 03:31 PM   Modules accepted: Orders

## 2015-08-29 ENCOUNTER — Encounter (HOSPITAL_COMMUNITY)
Admission: RE | Disposition: A | Discharge status: Home / Self Care | Payer: Self-pay | Source: Ambulatory Visit | Attending: Gastroenterology

## 2015-08-29 ENCOUNTER — Ambulatory Visit (HOSPITAL_COMMUNITY)
Admission: RE | Admit: 2015-08-29 | Discharge: 2015-08-29 | Disposition: A | Discharge status: Home / Self Care | Payer: Medicare HMO | Source: Ambulatory Visit | Attending: Gastroenterology | Admitting: Gastroenterology

## 2015-08-29 DIAGNOSIS — K449 Diaphragmatic hernia without obstruction or gangrene: Secondary | ICD-10-CM

## 2015-08-29 DIAGNOSIS — R1310 Dysphagia, unspecified: Secondary | ICD-10-CM | POA: Insufficient documentation

## 2015-08-29 HISTORY — PX: ESOPHAGEAL MANOMETRY: SHX5429

## 2015-08-29 SURGERY — MANOMETRY, ESOPHAGUS

## 2015-08-29 MED ORDER — LIDOCAINE VISCOUS 2 % MT SOLN
OROMUCOSAL | Status: AC
  Filled 2015-08-29: qty 15

## 2015-08-29 SURGICAL SUPPLY — 2 items
FACESHIELD LNG OPTICON STERILE (SAFETY) IMPLANT
GLOVE BIO SURGEON STRL SZ8 (GLOVE) ×4 IMPLANT

## 2015-08-29 NOTE — Progress Notes (Signed)
Esophageal Manometry done per protocol. Patient tolerated well. No complications.  Report to be sent to Dr. Havery Moros to be read.

## 2015-08-30 ENCOUNTER — Encounter (HOSPITAL_COMMUNITY): Payer: Self-pay | Admitting: Gastroenterology

## 2015-09-06 ENCOUNTER — Telehealth: Payer: Self-pay | Admitting: Gastroenterology

## 2015-09-06 DIAGNOSIS — R1310 Dysphagia, unspecified: Secondary | ICD-10-CM | POA: Insufficient documentation

## 2015-09-06 NOTE — Telephone Encounter (Signed)
Patient notified of results.

## 2015-09-06 NOTE — Telephone Encounter (Signed)
Stacy [name] can you please let the patient know we will send her manometry to the surgeon's office. She has normal progression of swallows with some mildly weak contractions, but no significant dysmotility. Large hiatal hernia is noted with hypotensive LES. Results to be scanned into Epic. Thanks

## 2015-09-17 DIAGNOSIS — K449 Diaphragmatic hernia without obstruction or gangrene: Secondary | ICD-10-CM | POA: Diagnosis not present

## 2015-09-17 DIAGNOSIS — Z8679 Personal history of other diseases of the circulatory system: Secondary | ICD-10-CM | POA: Diagnosis not present

## 2015-09-19 DIAGNOSIS — H353121 Nonexudative age-related macular degeneration, left eye, early dry stage: Secondary | ICD-10-CM | POA: Diagnosis not present

## 2015-09-19 DIAGNOSIS — H353221 Exudative age-related macular degeneration, left eye, with active choroidal neovascularization: Secondary | ICD-10-CM | POA: Diagnosis not present

## 2015-09-19 DIAGNOSIS — H35722 Serous detachment of retinal pigment epithelium, left eye: Secondary | ICD-10-CM | POA: Diagnosis not present

## 2015-09-19 DIAGNOSIS — H353111 Nonexudative age-related macular degeneration, right eye, early dry stage: Secondary | ICD-10-CM | POA: Diagnosis not present

## 2015-09-24 ENCOUNTER — Encounter: Payer: Self-pay | Admitting: Family Medicine

## 2015-09-24 ENCOUNTER — Ambulatory Visit (INDEPENDENT_AMBULATORY_CARE_PROVIDER_SITE_OTHER): Payer: Medicare HMO | Admitting: Family Medicine

## 2015-09-24 VITALS — BP 142/80 | HR 66 | Temp 97.6°F | Ht 66.0 in | Wt 144.3 lb

## 2015-09-24 DIAGNOSIS — R1310 Dysphagia, unspecified: Secondary | ICD-10-CM | POA: Diagnosis not present

## 2015-09-24 DIAGNOSIS — K449 Diaphragmatic hernia without obstruction or gangrene: Secondary | ICD-10-CM | POA: Diagnosis not present

## 2015-09-24 DIAGNOSIS — R899 Unspecified abnormal finding in specimens from other organs, systems and tissues: Secondary | ICD-10-CM | POA: Diagnosis not present

## 2015-09-24 LAB — CBC WITH DIFFERENTIAL/PLATELET
BASOS ABS: 0 10*3/uL (ref 0.0–0.1)
Basophils Relative: 0.5 % (ref 0.0–3.0)
Eosinophils Absolute: 0.2 10*3/uL (ref 0.0–0.7)
Eosinophils Relative: 2.6 % (ref 0.0–5.0)
HCT: 39.1 % (ref 36.0–46.0)
Hemoglobin: 13.3 g/dL (ref 12.0–15.0)
LYMPHS ABS: 0.9 10*3/uL (ref 0.7–4.0)
Lymphocytes Relative: 13.3 % (ref 12.0–46.0)
MCHC: 34 g/dL (ref 30.0–36.0)
MCV: 94.8 fl (ref 78.0–100.0)
MONO ABS: 0.7 10*3/uL (ref 0.1–1.0)
Monocytes Relative: 11.2 % (ref 3.0–12.0)
NEUTROS ABS: 4.6 10*3/uL (ref 1.4–7.7)
NEUTROS PCT: 72.4 % (ref 43.0–77.0)
PLATELETS: 124 10*3/uL — AB (ref 150.0–400.0)
RBC: 4.12 Mil/uL (ref 3.87–5.11)
RDW: 13.2 % (ref 11.5–15.5)
WBC: 6.4 10*3/uL (ref 4.0–10.5)

## 2015-09-24 NOTE — Progress Notes (Signed)
HPI:  Stacy [name] is a pleasant 75 year old female here today with concerns regarding her hiatal hernia. She recently saw gastroenterology and surgery for a large hiatal hernia and surgical repair apparently was advised. From her report, it sounds like the surgeon went over the risks and alternatives of surgery with her and that she has many questions and concerns. She reports her surgery and advised that she get a second opinion from another provider in their office as well if she would like to assist in making that decision. She wonders if this would be a good idea. She feels well, and besides mild dysphagia, but does not feel she has any other symptoms related to her hernia.  ROS: See pertinent positives and negatives per HPI.  Past Medical History  Diagnosis Date  . Hypothyroidism   . Osteopenia   . Colon polyp   . Vitamin D deficiency   . Dyslipidemia   . Atrial fibrillation ([place])     echo 12/23/10 EF= >55%, stress myoview 01/30/11 normal pattern of perfusion in all myocardial regions  . Chicken pox   . Seasonal allergies   . Heart murmur   . Scoliosis   . Thyroid disease   . Colon polyp   . Hiatal hernia   . Laryngopharyngeal reflux     Past Surgical History  Procedure Laterality Date  . Tonsillectomy      Childhood  . Breast cyst excision Right 1988  . Eye surgery      Cataract eye surgery-right 06/2014, left 04/2014  . Esophageal manometry N/A 08/29/2015    Procedure: ESOPHAGEAL MANOMETRY (EM);  Surgeon: Manus Gunning, MD;  Location: [facility];  Service: Gastroenterology;  Laterality: N/A;    Family History  Problem Relation Age of Onset  . CVA Mother   . Hyperlipidemia Mother   . Hypertension Mother   . Anuerysm Father     brain  . Stroke Maternal Grandmother   . Hypertension Maternal Grandmother   . Hypertension Sister   . Colon cancer Neg Hx     Social History   Social History  . Marital Status: Single    Spouse Name: N/A  . Number of  Children: N/A  . Years of Education: N/A   Occupational History  . retired    Social History Main Topics  . Smoking status: Never Smoker   . Smokeless tobacco: Never Used  . Alcohol Use: 0.6 oz/week    1 Glasses of wine per week     Comment: 1 glass of wine 3x a week  . Drug Use: No  . Sexual Activity: Not Asked   Other Topics Concern  . None   Social History Narrative   Work or School: Probation officer - poetry      Home Situation: lives alone      Spiritual Beliefs: Jewish      Lifestyle: [facility] [employer] [facility]; diet healthy              Current outpatient prescriptions:  .  aspirin 81 MG tablet, Take 81 mg by mouth daily., Disp: , Rfl:  .  calcium carbonate (OS-CAL) 600 MG TABS tablet, Take 600 mg by mouth daily. , Disp: , Rfl:  .  Cholecalciferol (VITAMIN D-3) 1000 UNITS CAPS, Take 1,000 Units by mouth 2 (two) times daily. , Disp: , Rfl:  .  diltiazem (CARTIA XT) 120 MG 24 hr capsule, Take 1 capsule (120 mg total) by mouth daily., Disp: 90 capsule, Rfl: 3 .  fish oil-omega-3 fatty acids 1000 MG capsule, Take 1 g by mouth 2 (two) times daily. , Disp: , Rfl:  .  levothyroxine (SYNTHROID, LEVOTHROID) 100 MCG tablet, Take one (1) tablet (100 mcg total) by mouth daily. Take one and a half tablets (150 mcg total) by mouth every Monday and Thursday., Disp: , Rfl:  .  Multiple Vitamin (MULTIVITAMIN) capsule, Take 1 capsule by mouth daily., Disp: , Rfl:  .  Multiple Vitamins-Minerals (PRESERVISION AREDS PO), Take 1 capsule by mouth 2 (two) times daily. Reported on 08/22/2015, Disp: , Rfl:  .  pantoprazole (PROTONIX) 40 MG tablet, Take 1 tablet (40 mg total) by mouth daily., Disp: 30 tablet, Rfl: 2  EXAM:  Filed Vitals:   09/24/15 1024  BP: 142/80  Pulse: 66  Temp: 97.6 F (36.4 C)    Body mass index is 23.3 kg/(m^2).  GENERAL: vitals reviewed and listed above, alert, oriented, appears well hydrated and in no acute distress  HEENT: atraumatic, conjunttiva clear, no  obvious abnormalities on inspection of external nose and ears  NECK: no obvious masses on inspectiona   MS: moves all extremities without noticeable abnormality  PSYCH: pleasant and cooperative, no obvious depression or anxiety  ASSESSMENT AND PLAN:  Discussed the following assessment and plan:  Hiatal hernia  Abnormal laboratory test - Plan: CBC with Differential  Dysphagia  -Spent time listening to her many concerns and questions. She has a long list of present contact and has done quite a bit of research on the Internet regarding hiatal hernia surgery. I advised that I am not as quick to answer her many questions as her surgeon may be. I did advise setting up another office visit with her surgeon or another surgeon to discuss her questions regarding complications, outcomes, recovery and alternatives and risk to not doing the surgery. She agreed. We will recheck her lab work today to save her trip as it was scheduled in a few days. -Patient advised to return or notify a doctor immediately if symptoms worsen or persist or new concerns arise.  There are no Patient Instructions on file for this visit.   Colin Benton R.

## 2015-09-24 NOTE — Progress Notes (Signed)
Pre visit review using our clinic review tool, if applicable. No additional management support is needed unless otherwise documented below in the visit note. 

## 2015-09-25 ENCOUNTER — Encounter: Payer: Self-pay | Admitting: Family Medicine

## 2015-09-26 ENCOUNTER — Other Ambulatory Visit: Payer: Medicare HMO

## 2015-09-26 DIAGNOSIS — H353221 Exudative age-related macular degeneration, left eye, with active choroidal neovascularization: Secondary | ICD-10-CM | POA: Diagnosis not present

## 2015-09-26 DIAGNOSIS — H35722 Serous detachment of retinal pigment epithelium, left eye: Secondary | ICD-10-CM | POA: Diagnosis not present

## 2015-09-26 DIAGNOSIS — H353121 Nonexudative age-related macular degeneration, left eye, early dry stage: Secondary | ICD-10-CM | POA: Diagnosis not present

## 2015-09-26 DIAGNOSIS — H353111 Nonexudative age-related macular degeneration, right eye, early dry stage: Secondary | ICD-10-CM | POA: Diagnosis not present

## 2015-10-01 DIAGNOSIS — R69 Illness, unspecified: Secondary | ICD-10-CM | POA: Diagnosis not present

## 2015-10-10 DIAGNOSIS — H353111 Nonexudative age-related macular degeneration, right eye, early dry stage: Secondary | ICD-10-CM | POA: Diagnosis not present

## 2015-10-10 DIAGNOSIS — H353121 Nonexudative age-related macular degeneration, left eye, early dry stage: Secondary | ICD-10-CM | POA: Diagnosis not present

## 2015-10-10 DIAGNOSIS — H353221 Exudative age-related macular degeneration, left eye, with active choroidal neovascularization: Secondary | ICD-10-CM | POA: Diagnosis not present

## 2015-10-10 DIAGNOSIS — H35722 Serous detachment of retinal pigment epithelium, left eye: Secondary | ICD-10-CM | POA: Diagnosis not present

## 2015-10-15 ENCOUNTER — Telehealth: Payer: Self-pay | Admitting: Gastroenterology

## 2015-10-15 NOTE — Telephone Encounter (Signed)
If she wishes to try these foods and see if she tolerates it that's fine. It they make her reflux symptoms worse at all she can then back off, it's up to her and how she tolerates them. Thanks

## 2015-10-15 NOTE — Telephone Encounter (Signed)
Spoke with patient and she has completed PPI for 2 months and will take Zantac as needed now. She is asking if she may eat citrus fruits and tomatoes now. Please, advise.

## 2015-10-15 NOTE — Telephone Encounter (Signed)
Patient given recommendations. 

## 2015-10-17 DIAGNOSIS — K449 Diaphragmatic hernia without obstruction or gangrene: Secondary | ICD-10-CM | POA: Diagnosis not present

## 2015-10-19 ENCOUNTER — Encounter: Payer: Self-pay | Admitting: Podiatry

## 2015-10-19 ENCOUNTER — Ambulatory Visit (INDEPENDENT_AMBULATORY_CARE_PROVIDER_SITE_OTHER): Payer: Medicare HMO | Admitting: Podiatry

## 2015-10-19 VITALS — BP 161/76 | HR 69 | Resp 12

## 2015-10-19 DIAGNOSIS — B351 Tinea unguium: Secondary | ICD-10-CM | POA: Diagnosis not present

## 2015-10-19 DIAGNOSIS — L600 Ingrowing nail: Secondary | ICD-10-CM

## 2015-10-19 NOTE — Progress Notes (Signed)
   Subjective:    Patient ID: Stacy [name], female    DOB: [birth year], 75 y.o.   MRN: [id number]  HPI  Pt stated b/l great toenail have discoloration/dry nail for 2 years. Toenails are looking worse and tried OTC antifungal treatment-no help  Review of Systems  Skin: Positive for color change.       Objective:   Physical Exam        Assessment & Plan:

## 2015-10-22 NOTE — Progress Notes (Signed)
Subjective:     Patient ID: Stacy [name], female   DOB: 1941/06/14, 75 y.o.   MRN: [id number]  HPI patient presents with concerns about thickness and discoloration of the big toenails of both feet. States that she has tried topical medicines in a currently they don't hurt but she's not sure what the problem   Review of Systems  All other systems reviewed and are negative.      Objective:   Physical Exam  Constitutional: She is oriented to person, place, and time.  Cardiovascular: Intact distal pulses.   Musculoskeletal: Normal range of motion.  Neurological: She is oriented to person, place, and time.  Skin: Skin is warm.  Nursing note and vitals reviewed.  neurovascular status intact muscle strength adequate range of motion within normal limits with patient found to have discoloration of the big toenails distally with proximal no indications of pathology with patient found to have no drainage noted. 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 I've recommended treatment with continued topical medicine but no aggressive treatment if symptoms get worse we may need to consider removal

## 2015-10-24 ENCOUNTER — Other Ambulatory Visit: Payer: Self-pay | Admitting: Interventional Cardiology

## 2015-10-25 NOTE — Telephone Encounter (Signed)
REFILL 

## 2015-11-07 ENCOUNTER — Other Ambulatory Visit: Payer: Self-pay | Admitting: Family Medicine

## 2015-11-12 ENCOUNTER — Encounter: Payer: Self-pay | Admitting: Family Medicine

## 2015-11-13 ENCOUNTER — Other Ambulatory Visit: Payer: Self-pay | Admitting: *Deleted

## 2015-11-13 MED ORDER — LEVOTHYROXINE SODIUM 100 MCG PO TABS: ORAL_TABLET | ORAL | Status: DC

## 2015-11-13 NOTE — Telephone Encounter (Signed)
Rx done and the pt was informed via Mychart message. 

## 2015-11-14 DIAGNOSIS — H353121 Nonexudative age-related macular degeneration, left eye, early dry stage: Secondary | ICD-10-CM | POA: Diagnosis not present

## 2015-11-14 DIAGNOSIS — H35362 Drusen (degenerative) of macula, left eye: Secondary | ICD-10-CM | POA: Diagnosis not present

## 2015-11-14 DIAGNOSIS — H353111 Nonexudative age-related macular degeneration, right eye, early dry stage: Secondary | ICD-10-CM | POA: Diagnosis not present

## 2015-11-14 DIAGNOSIS — H35722 Serous detachment of retinal pigment epithelium, left eye: Secondary | ICD-10-CM | POA: Diagnosis not present

## 2015-11-29 ENCOUNTER — Encounter: Payer: Self-pay | Admitting: Family Medicine

## 2015-11-29 DIAGNOSIS — R899 Unspecified abnormal finding in specimens from other organs, systems and tissues: Secondary | ICD-10-CM

## 2015-11-29 DIAGNOSIS — E038 Other specified hypothyroidism: Secondary | ICD-10-CM

## 2015-12-03 NOTE — Telephone Encounter (Signed)
I called the pt and informed her of the message below.  Lab appt was scheduled for 7/19 and she stated she will schedule the physical appt when she comes in on 7/19.

## 2015-12-26 DIAGNOSIS — Z961 Presence of intraocular lens: Secondary | ICD-10-CM | POA: Diagnosis not present

## 2015-12-26 DIAGNOSIS — H52203 Unspecified astigmatism, bilateral: Secondary | ICD-10-CM | POA: Diagnosis not present

## 2016-01-31 DIAGNOSIS — R69 Illness, unspecified: Secondary | ICD-10-CM | POA: Diagnosis not present

## 2016-02-13 DIAGNOSIS — H353121 Nonexudative age-related macular degeneration, left eye, early dry stage: Secondary | ICD-10-CM | POA: Diagnosis not present

## 2016-02-13 DIAGNOSIS — H353111 Nonexudative age-related macular degeneration, right eye, early dry stage: Secondary | ICD-10-CM | POA: Diagnosis not present

## 2016-02-13 DIAGNOSIS — H353132 Nonexudative age-related macular degeneration, bilateral, intermediate dry stage: Secondary | ICD-10-CM | POA: Diagnosis not present

## 2016-02-13 DIAGNOSIS — H35722 Serous detachment of retinal pigment epithelium, left eye: Secondary | ICD-10-CM | POA: Diagnosis not present

## 2016-02-18 ENCOUNTER — Ambulatory Visit (INDEPENDENT_AMBULATORY_CARE_PROVIDER_SITE_OTHER): Payer: Medicare HMO | Admitting: Podiatry

## 2016-02-18 ENCOUNTER — Encounter: Payer: Self-pay | Admitting: Podiatry

## 2016-02-18 VITALS — BP 160/82 | HR 68 | Resp 16

## 2016-02-18 DIAGNOSIS — L600 Ingrowing nail: Secondary | ICD-10-CM | POA: Diagnosis not present

## 2016-02-18 NOTE — Patient Instructions (Signed)

## 2016-02-19 NOTE — Progress Notes (Signed)
Subjective:     Patient ID: Stacy [name], female   DOB: 1941-07-15, 75 y.o.   MRN: [id number]  HPI patient presents with a crack in the right hallux nail and the ingrown toenail second right that's been painful and she cannot get out herself   Review of Systems     Objective:   Physical Exam Neurovascular status intact muscle strength adequate with thickness of the hallux nail right with the distal one third being loose and incurvated lateral border the right second nail that's painful    Assessment:     Traumatized right hallux nail with incurvated ingrown second nail right lateral side    Plan:     H&P condition reviewed and recommended correction of second toe. I went ahead today and I infiltrated the right second toe 60 mg Xylocaine Marcaine mixture remove the lateral quarter exposed matrix and applied phenol 3 applications 30 seconds followed by alcohol lavage and sterile dressing. Gave instructions on soaks at this time and debrided the hallux nail the loose portion

## 2016-02-20 ENCOUNTER — Ambulatory Visit: Payer: Medicare HMO | Admitting: Family Medicine

## 2016-02-20 ENCOUNTER — Other Ambulatory Visit (INDEPENDENT_AMBULATORY_CARE_PROVIDER_SITE_OTHER): Payer: Medicare HMO

## 2016-02-20 DIAGNOSIS — R899 Unspecified abnormal finding in specimens from other organs, systems and tissues: Secondary | ICD-10-CM | POA: Diagnosis not present

## 2016-02-20 DIAGNOSIS — E038 Other specified hypothyroidism: Secondary | ICD-10-CM

## 2016-02-20 LAB — CBC WITH DIFFERENTIAL/PLATELET
Basophils Absolute: 0 10*3/uL (ref 0.0–0.1)
Basophils Relative: 0.3 % (ref 0.0–3.0)
Eosinophils Absolute: 0.1 10*3/uL (ref 0.0–0.7)
Eosinophils Relative: 2.1 % (ref 0.0–5.0)
HEMATOCRIT: 42 % (ref 36.0–46.0)
Hemoglobin: 13.9 g/dL (ref 12.0–15.0)
LYMPHS ABS: 1 10*3/uL (ref 0.7–4.0)
LYMPHS PCT: 19.3 % (ref 12.0–46.0)
MCHC: 33.1 g/dL (ref 30.0–36.0)
MCV: 95.5 fl (ref 78.0–100.0)
MONOS PCT: 11.6 % (ref 3.0–12.0)
Monocytes Absolute: 0.6 10*3/uL (ref 0.1–1.0)
NEUTROS ABS: 3.5 10*3/uL (ref 1.4–7.7)
NEUTROS PCT: 66.7 % (ref 43.0–77.0)
Platelets: 126 10*3/uL — ABNORMAL LOW (ref 150.0–400.0)
RBC: 4.4 Mil/uL (ref 3.87–5.11)
RDW: 13.9 % (ref 11.5–15.5)
WBC: 5.2 10*3/uL (ref 4.0–10.5)

## 2016-02-20 LAB — TSH: TSH: 1.14 u[IU]/mL (ref 0.35–4.50)

## 2016-02-25 ENCOUNTER — Telehealth: Payer: Self-pay

## 2016-02-25 NOTE — Telephone Encounter (Signed)
Pt is due for a follow with Dr. Maudie Mercury.   I am not sure who to route this to.

## 2016-02-29 NOTE — Telephone Encounter (Signed)
Per the note in the chart dated 11/29/15 pt was to return 02/20/16 for blood work and schedule an appointment with Dr. Maudie Mercury when she was here. She will be out of town late July and most of August. Pt stated she would not be available prior to September.

## 2016-03-03 NOTE — Telephone Encounter (Signed)
Ok to do lab visit for cbc when convenient for her in September. It seems she only wants to follow up with me yearly. That is ok with me.

## 2016-03-03 NOTE — Telephone Encounter (Signed)
I left a detailed message with the information below at the pts home number. 

## 2016-03-04 ENCOUNTER — Telehealth: Payer: Self-pay | Admitting: *Deleted

## 2016-03-04 NOTE — Telephone Encounter (Signed)
Left message for patient at [phone] (Home #) to check to see how they were doing from their ingrown toenail procedure that was performed on Monday, February 18, 2016. Waiting for a response.

## 2016-03-13 ENCOUNTER — Encounter: Payer: Self-pay | Admitting: Family Medicine

## 2016-04-01 DIAGNOSIS — H6123 Impacted cerumen, bilateral: Secondary | ICD-10-CM | POA: Diagnosis not present

## 2016-05-15 NOTE — Progress Notes (Signed)
Patient ID: Stacy [name], female   DOB: Aug 11, 1940, 75 y.o.   MRN: [id number]     Cardiology Office Note   Date:  05/16/2016   ID:  Stacy [name], DOB June 14, 1941, MRN [id number]  PCP:  Stacy Kern., DO    No chief complaint on file. f/u AFib   Wt Readings from Last 3 Encounters:  05/16/16 147 lb (66.7 kg)  09/24/15 144 lb 4.8 oz (65.5 kg)  08/22/15 143 lb 12.8 oz (65.2 kg)       History of Present Illness: Stacy [name] is a 75 y.o. female  who had an episode of atrial fibrillation in 5/12. She spontaneously converted within the first 24 hours. She was on xarelto, and coumadin but decided to stop anticoagulation due to the need for testing. Her symptoms were very distinct. She has not had any other symptoms since that time. Atrial Fibrillation F/U:  exercises 3x/week. She goes to the [facility] and does weights, treadmill, classes. No symptoms with this activity. Denies : Chest pain.  Dizziness.  Orthopnea.  Palpitations.  Syncope.   SHe has had varicose veins. She did not have any pain. Mild swelling bilaterally, much improved after changing to Cartia XT instead of diltiazem.  Exercising regularly without problems. No further AFib sx. No bruising.   Leg swelling better with compression stockings.  No significant change in the past year since her last visit.  SHe was diagnosed with a hiatal hernia and has occasional GERD.  She took a PPI but this was stopped.      Past Medical History:  Diagnosis Date  . Atrial fibrillation ([place])    echo 12/23/10 EF= >55%, stress myoview 01/30/11 normal pattern of perfusion in all myocardial regions  . Chicken pox   . Colon polyp   . Colon polyp   . Dyslipidemia   . Heart murmur   . Hiatal hernia   . Hypothyroidism   . Laryngopharyngeal reflux   . Osteopenia   . Scoliosis   . Seasonal allergies   . Thyroid disease   . Vitamin D deficiency     Past Surgical History:  Procedure Laterality Date  . BREAST CYST EXCISION  Right 1988  . ESOPHAGEAL MANOMETRY N/A 08/29/2015   Procedure: ESOPHAGEAL MANOMETRY (EM);  Surgeon: Stacy Gunning, MD;  Location: [facility];  Service: Gastroenterology;  Laterality: N/A;  . EYE SURGERY     Cataract eye surgery-right 06/2014, left 04/2014  . TONSILLECTOMY     Childhood     Current Outpatient Prescriptions  Medication Sig Dispense Refill  . aspirin 81 MG tablet Take 81 mg by mouth daily.    . calcium carbonate (OS-CAL) 600 MG TABS tablet Take 600 mg by mouth daily.     [name] Kitchen CARTIA XT 120 MG 24 hr capsule TAKE ONE CAPSULE BY MOUTH DAILY 90 capsule 2  . Cholecalciferol (VITAMIN D-3) 1000 UNITS CAPS Take 1,000 Units by mouth 2 (two) times daily.     . fish oil-omega-3 fatty acids 1000 MG capsule Take 1 g by mouth 2 (two) times daily.     [name] Kitchen levothyroxine (SYNTHROID, LEVOTHROID) 100 MCG tablet Take one tablet by mouth daily before breakfast. Take an extra one-half tablet on Monday and Thursday. 135 tablet 2  . Multiple Vitamin (MULTIVITAMIN) capsule Take 1 capsule by mouth daily.    . Multiple Vitamins-Minerals (PRESERVISION AREDS PO) Take 1 capsule by mouth 2 (two) times daily. Reported on 08/22/2015     No current facility-administered medications for this  visit.     Allergies:   Amoxicillin; Flonase [fluticasone propionate]; Sulfa antibiotics; and Sulfasalazine    Social History:  The patient  reports that she has never smoked. She has never used smokeless tobacco. She reports that she drinks about 0.6 oz of alcohol per week . She reports that she does not use drugs.   Family History:  The patient's family history includes Stacy [name] in her father; CVA in her mother; Hyperlipidemia in her mother; Hypertension in her maternal grandmother, mother, and sister; Stroke in her maternal grandmother.    ROS:  Please see the history of present illness.   Otherwise, review of systems are positive for fluid in the eye.  No palpiatation.  Wearing compression stockings.   All  other systems are reviewed and negative.    PHYSICAL EXAM: VS:  BP 140/80   Pulse 63   Ht 5\' 6"  (1.676 m)   Wt 147 lb (66.7 kg)   BMI 23.73 kg/m  , BMI Body mass index is 23.73 kg/m. GEN: Well nourished, well developed, in no acute distress  HEENT: normal  Neck: no JVD, carotid bruits, or masses Cardiac: RRR, premature beats; 2/6 early systolic and 2/6 holodiastolic murmurs, rubs, or gallops, varicose veins Respiratory:  clear to auscultation bilaterally, normal work of breathing GI: soft, nontender, nondistended, + BS MS: no deformity or atrophy  Skin: warm and dry, small red  Rash on both antecubital areas Neuro:  Strength and sensation are intact Psych: euthymic mood, full affect   EKG:   The ekg ordered today demonstrates NSR with nonspecific ST segment changes   Recent Labs: 08/22/2015: BUN 29; Creatinine, Ser 0.92; Potassium 4.2; Sodium 138 02/20/2016: Hemoglobin 13.9; Platelets 126.0; TSH 1.14   Lipid Panel    Component Value Date/Time   CHOL 224 (H) 08/22/2015 1052   TRIG 66.0 08/22/2015 1052   HDL 79.50 08/22/2015 1052   CHOLHDL 3 08/22/2015 1052   VLDL 13.2 08/22/2015 1052   LDLCALC 131 (H) 08/22/2015 1052   LDLDIRECT 148.1 08/17/2013 1143     Other studies Reviewed: Additional studies/ records that were reviewed today with results demonstrating: 2014 echo: normal LV function with mild AI.   ASSESSMENT AND PLAN:  1. AFib:  Maintatining NSR.  Tolerating aspirin.  She has no interest in more potent anticoagulation.  No recent palpitations.  2. Varicose veins: Continue compression stockings.  Swelling controlled.  3. AI: No CHF sx.  Echo in 2014.  Nomral LVEF.   Current medicines are reviewed at length with the patient today.  The patient concerns regarding her medicines were addressed.  The following changes have been made:  No change  Labs/ tests ordered today include:  No orders of the defined types were placed in this encounter.   Recommend 150  minutes/week of aerobic exercise Low fat, low carb, high fiber diet recommended  Disposition:   FU in 1 year   Signed, Stacy Grooms, MD  05/16/2016 10:36 AM    [facility] [place], [place],   [address] Phone: [phone]; Fax: [phone]

## 2016-05-16 ENCOUNTER — Ambulatory Visit (INDEPENDENT_AMBULATORY_CARE_PROVIDER_SITE_OTHER): Payer: Medicare HMO | Admitting: Interventional Cardiology

## 2016-05-16 ENCOUNTER — Encounter: Payer: Self-pay | Admitting: Interventional Cardiology

## 2016-05-16 VITALS — BP 140/80 | HR 63 | Ht 66.0 in | Wt 147.0 lb

## 2016-05-16 DIAGNOSIS — I351 Nonrheumatic aortic (valve) insufficiency: Secondary | ICD-10-CM

## 2016-05-16 DIAGNOSIS — R609 Edema, unspecified: Secondary | ICD-10-CM

## 2016-05-16 DIAGNOSIS — I480 Paroxysmal atrial fibrillation: Secondary | ICD-10-CM

## 2016-05-16 MED ORDER — DILTIAZEM HCL ER COATED BEADS 120 MG PO CP24: 120.0000 mg | ORAL_CAPSULE | Freq: Every day | ORAL | 3 refills | Status: DC

## 2016-05-16 NOTE — Patient Instructions (Signed)

## 2016-06-09 ENCOUNTER — Ambulatory Visit: Payer: Medicare HMO

## 2016-06-13 ENCOUNTER — Ambulatory Visit (INDEPENDENT_AMBULATORY_CARE_PROVIDER_SITE_OTHER): Payer: Medicare HMO

## 2016-06-13 DIAGNOSIS — Z23 Encounter for immunization: Secondary | ICD-10-CM

## 2016-08-13 DIAGNOSIS — H353121 Nonexudative age-related macular degeneration, left eye, early dry stage: Secondary | ICD-10-CM | POA: Diagnosis not present

## 2016-08-13 DIAGNOSIS — H353132 Nonexudative age-related macular degeneration, bilateral, intermediate dry stage: Secondary | ICD-10-CM | POA: Diagnosis not present

## 2016-08-13 DIAGNOSIS — H353111 Nonexudative age-related macular degeneration, right eye, early dry stage: Secondary | ICD-10-CM | POA: Diagnosis not present

## 2016-08-13 DIAGNOSIS — H35722 Serous detachment of retinal pigment epithelium, left eye: Secondary | ICD-10-CM | POA: Diagnosis not present

## 2016-08-24 DIAGNOSIS — M810 Age-related osteoporosis without current pathological fracture: Secondary | ICD-10-CM | POA: Insufficient documentation

## 2016-08-24 NOTE — Progress Notes (Signed)
Medicare Annual Preventive Care Visit  (initial annual wellness or annual wellness exam)  Concerns and/or follow up today: She has a history of hypothyroidism, GERD, a very large hiatal hernia (seen by GI and surgery), osteoporosis, scoliosis, Atrial fib (sees cardiologist), mildly low platelets and hypertension. Mrs. Bartholf if very strongly opinionated and refuses some medical advice and some recommended preventive care measures. She wants to have a skin lesion removed from her L upper chest wall. It is very annoying to her and catches on her bra. She wants to know my thoughts on supplements. Wants to repeat DEXA.  She refused vaccine in the past and today. She refused medication for osteoporosis in the past, she still is not sure if she would want to take.  ROS: negative for report of fevers, unintentional weight loss, vision changes, vision loss, hearing loss or change, chest pain, sob, hemoptysis, melena, hematochezia, hematuria, genital discharge or lesions, falls, bleeding or bruising, loc, thoughts of suicide or self harm, memory loss  1.) Patient-completed health risk assessment  - completed and reviewed, see scanned documentation  2.) Review of Medical History: -PMH, PSH, Family History and current specialty and care providers reviewed and updated and listed below  - see scanned in document in chart and below  Past Medical History:  Diagnosis Date  . Atrial fibrillation ([place])    echo 12/23/10 EF= >55%, stress myoview 01/30/11 normal pattern of perfusion in all myocardial regions  . Chicken pox   . Colon polyp   . Colon polyp   . Dyslipidemia   . Heart murmur   . Hiatal hernia   . Hypothyroidism   . Laryngopharyngeal reflux   . Osteopenia   . Scoliosis   . Seasonal allergies   . Thyroid disease   . Vitamin D deficiency     Past Surgical History:  Procedure Laterality Date  . BREAST CYST EXCISION Right 1988  . ESOPHAGEAL MANOMETRY N/A 08/29/2015   Procedure: ESOPHAGEAL  MANOMETRY (EM);  Surgeon: Manus Gunning, MD;  Location: [facility];  Service: Gastroenterology;  Laterality: N/A;  . EYE SURGERY     Cataract eye surgery-right 06/2014, left 04/2014  . TONSILLECTOMY     Childhood    Social History   Social History  . Marital status: Single    Spouse name: N/A  . Number of children: N/A  . Years of education: N/A   Occupational History  . retired    Social History Main Topics  . Smoking status: Never Smoker  . Smokeless tobacco: Never Used  . Alcohol use 0.6 oz/week    1 Glasses of wine per week     Comment: 1 glass of wine 3x a week  . Drug use: No  . Sexual activity: Not on file   Other Topics Concern  . Not on file   Social History Narrative   Work or School: [employer] Situation: lives alone      Spiritual Beliefs: Jewish      Lifestyle[facility] [employer] [facility]; diet healthy             Family History  Problem Relation Age of Onset  . CVA Mother   . Hyperlipidemia Mother   . Hypertension Mother   . Anuerysm Father     brain  . Stroke Maternal Grandmother   . Hypertension Maternal Grandmother   . Hypertension Sister   . Colon cancer Neg Hx   . Heart  attack Neg Hx     Current Outpatient Prescriptions on File Prior to Visit  Medication Sig Dispense Refill  . aspirin 81 MG tablet Take 81 mg by mouth daily.    . calcium carbonate (OS-CAL) 600 MG TABS tablet Take 600 mg by mouth daily.     . Cholecalciferol (VITAMIN D-3) 1000 UNITS CAPS Take 1,000 Units by mouth 2 (two) times daily.     [name] Kitchen diltiazem (CARTIA XT) 120 MG 24 hr capsule Take 1 capsule (120 mg total) by mouth daily. 90 capsule 3  . fish oil-omega-3 fatty acids 1000 MG capsule Take 1 g by mouth 2 (two) times daily.     [name] Kitchen levothyroxine (SYNTHROID, LEVOTHROID) 100 MCG tablet Take one tablet by mouth daily before breakfast. Take an extra one-half tablet on Monday and Thursday. 135 tablet 2  . Multiple Vitamin (MULTIVITAMIN) capsule  Take 1 capsule by mouth daily.    . Multiple Vitamins-Minerals (PRESERVISION AREDS PO) Take 1 capsule by mouth 2 (two) times daily. Reported on 08/22/2015     No current facility-administered medications on file prior to visit.      3.) Review of functional ability and level of safety:  Any difficulty hearing?  See scanned documentation  History of falling?  See scanned documentation  Any trouble with IADLs - using a phone, using transportation, grocery shopping, preparing meals, doing housework, doing laundry, taking medications and managing money?  See scanned documentation  Advance Directives? Done.  See summary of recommendations in Patient Instructions below.  4.) Physical Exam Vitals:   08/25/16 1059  BP: 136/78  Pulse: 64  Temp: 97.5 F (36.4 C)   Estimated body mass index is 24.14 kg/m as calculated from the following:   Height as of this encounter: 5' 5.5" (1.664 m).   Weight as of this encounter: 147 lb 4.8 oz (66.8 kg).  EKG (optional): deferred as sees cardiology  GENERAL: vitals reviewed and listed below, alert, oriented, appears well hydrated and in no acute distress  HEENT: head atraumatic, PERRLA, normal appearance of eyes, ears, nose and mouth. moist mucus membranes. Visual acuity grossly intact.  NECK: supple, thyroid seem homogenously fuller then normal today bilaterally  LUNGS: clear to auscultation bilaterally, no rales, rhonchi or wheeze  CV: irr irr, no peripheral edema or cyanosis, normal pedal pulses  BREAST: normal appearance - no skin lesions or discharge noted on inspection of both breasts, on palpation of both breast and axillary region no suspicious lesions appreciated today  GU: declined  ABDOMEN: bowel sounds normal, soft, non tender to palpation, no masses, no rebound or guarding  SKIN: no rash or abnormal lesions, SK is the lesion of concern on L upper chest wall  MS: normal gait, moves all extremities normally  NEURO: normal  gait, speech and thought processing grossly intact, muscle tone grossly intact throughout  PSYCH: normal affect, pleasant and cooperative  Cognitive function grossly intact  See patient instructions for recommendations.  Education and counseling regarding the above review of health provided with a plan for the following: -see scanned patient completed form for further details -fall prevention strategies discussed  -healthy lifestyle discussed -importance and resources for completing advanced directives discussed -see patient instructions below for any other recommendations provided  4)The following written screening schedule of preventive measures were reviewed with assessment and plan made per below, orders and patient instructions:       Alcohol screening done     Obesity Screening and counseling done  STI screening (Hep C if born 70-65) offered and per pt wishes     Tobacco Screening done done       Pneumococcal (PPSV23 -one dose after 64, one before if risk factors), influenza yearly and hepatitis B vaccines (if high risk - end stage renal disease, IV drugs, homosexual men, live in home for mentally retarded, hemophilia receiving factors) ASSESSMENT/PLAN: refused       Screening mammograph (yearly if >40) ASSESSMENT/PLAN: per pt preference every other year and she agreed to schedule      Screening Pap smear/pelvic exam (q2 years) ASSESSMENT/PLAN: n/a, declined      Colorectal cancer screening (FOBT yearly or flex sig q4y or colonoscopy q10y or barium enema q4y) ASSESSMENT/PLAN: sees GI, utd, done in 20112      Bone mass measurements(covered q2y if indicated - estrogen def, osteoporosis, hyperparathyroid, vertebral abnormalities, osteoporosis or steroids) ASSESSMENT/PLAN: last dexa 08/2014 with osteoporosis, pt opted for vit D and wt bearing exercise and declined medication. Due for repeat and offered and will have assistant order      Screening for glaucoma(q1y if high  risk - diabetes, FH, AA and > 50 or hispanic and > 65) ASSESSMENT/PLAN: utd or advised      Cardiovascular screening blood tests (lipids q5y) ASSESSMENT/PLAN: see orders and labs      Diabetes screening tests ASSESSMENT/PLAN: see orders and labs   7.) Summary: -risk factors and conditions per above assessment were discussed and treatment, recommendations and referrals were offered per documentation above and orders and patient instructions.  Medicare annual wellness visit, subsequent  Encounter for preventive health examination  Osteoporosis, unspecified osteoporosis type, unspecified pathological fracture presence  Paroxysmal atrial fibrillation ([place]) - Plan: Basic metabolic panel, CBC (no diff)  Hypothyroidism, unspecified type - Plan: TSH  Dyslipidemia - Plan: Lipid Panel  Thyroid fullness - Plan: US Soft Tissue Head/Neck  Patient Instructions  BEFORE YOU LEAVE: -labs -follow up: 4-6 months  Source naturals vit D3 1000 IU daily  Schedule your mammogram  -We placed a referral for you as discussed for a bone density test and the ultrasound of the thyroid. It usually takes about 1-2 weeks to process and schedule this referral. If you have not heard from [place] regarding this appointment in 2 weeks please contact our office.   We recommend the following healthy lifestyle for LIFE: 1) Small portions.   Tip: eat off of a salad plate instead of a dinner plate.  Tip: if you need more or a snack choose fruits, veggies and/or a handful of nuts or seeds.  2) Eat a healthy clean diet.  * Tip: Avoid (less then 1 serving per week): processed foods, sweets, sweetened drinks, white starches (rice, flour, bread, potatoes, pasta, etc), red meat, fast foods, butter  *Tip: CHOOSE instead   * 5-9 servings per day of fresh or frozen fruits and vegetables (but not corn, potatoes, bananas, canned or dried fruit)   *nuts and seeds, beans   *olives and olive oil   *small portions of lean meats  such as fish and white chicken    *small portions of whole grains  3)Get at least 150 minutes of sweaty aerobic exercise per week.  4)Reduce stress - consider counseling, meditation and relaxation to balance other aspects of your life.      Colin Benton R., DO

## 2016-08-25 ENCOUNTER — Encounter: Payer: Self-pay | Admitting: Family Medicine

## 2016-08-25 ENCOUNTER — Ambulatory Visit (INDEPENDENT_AMBULATORY_CARE_PROVIDER_SITE_OTHER): Payer: Medicare HMO | Admitting: Family Medicine

## 2016-08-25 VITALS — BP 136/78 | HR 64 | Temp 97.5°F | Ht 65.5 in | Wt 147.3 lb

## 2016-08-25 DIAGNOSIS — I480 Paroxysmal atrial fibrillation: Secondary | ICD-10-CM | POA: Diagnosis not present

## 2016-08-25 DIAGNOSIS — E0789 Other specified disorders of thyroid: Secondary | ICD-10-CM

## 2016-08-25 DIAGNOSIS — M810 Age-related osteoporosis without current pathological fracture: Secondary | ICD-10-CM | POA: Diagnosis not present

## 2016-08-25 DIAGNOSIS — E039 Hypothyroidism, unspecified: Secondary | ICD-10-CM | POA: Diagnosis not present

## 2016-08-25 DIAGNOSIS — Z0000 Encounter for general adult medical examination without abnormal findings: Secondary | ICD-10-CM

## 2016-08-25 DIAGNOSIS — E785 Hyperlipidemia, unspecified: Secondary | ICD-10-CM

## 2016-08-25 LAB — BASIC METABOLIC PANEL
BUN: 20 mg/dL (ref 6–23)
CALCIUM: 9.3 mg/dL (ref 8.4–10.5)
CO2: 32 mEq/L (ref 19–32)
Chloride: 103 mEq/L (ref 96–112)
Creatinine, Ser: 0.77 mg/dL (ref 0.40–1.20)
GFR: 77.28 mL/min (ref >60.00)
Glucose, Bld: 94 mg/dL (ref 70–99)
Potassium: 3.7 mEq/L (ref 3.5–5.1)
SODIUM: 140 meq/L (ref 135–145)

## 2016-08-25 LAB — CBC
HCT: 43.1 % (ref 36.0–46.0)
Hemoglobin: 14.8 g/dL (ref 12.0–15.0)
MCHC: 34.2 g/dL (ref 30.0–36.0)
MCV: 95.1 fl (ref 78.0–100.0)
Platelets: 139 10*3/uL — ABNORMAL LOW (ref 150.0–400.0)
RBC: 4.54 Mil/uL (ref 3.87–5.11)
RDW: 13.4 % (ref 11.5–15.5)
WBC: 6.4 10*3/uL (ref 4.0–10.5)

## 2016-08-25 LAB — LIPID PANEL
CHOLESTEROL: 244 mg/dL — AB (ref 0–200)
HDL: 82.6 mg/dL (ref >39.00)
LDL CALC: 146 mg/dL — AB (ref 0–99)
NonHDL: 161.75
TRIGLYCERIDES: 78 mg/dL (ref 0.0–149.0)
Total CHOL/HDL Ratio: 3
VLDL: 15.6 mg/dL (ref 0.0–40.0)

## 2016-08-25 LAB — TSH: TSH: 0.37 u[IU]/mL (ref 0.35–4.50)

## 2016-08-25 NOTE — Progress Notes (Signed)
Pre visit review using our clinic review tool, if applicable. No additional management support is needed unless otherwise documented below in the visit note. 

## 2016-08-25 NOTE — Addendum Note (Signed)
Addended by: Agnes Lawrence on: 08/25/2016 12:06 PM   Modules accepted: Orders

## 2016-08-25 NOTE — Patient Instructions (Signed)
BEFORE YOU LEAVE: -labs -follow up: 4-6 months  Source naturals vit D3 1000 IU daily  Schedule your mammogram  -We placed a referral for you as discussed for a bone density test and the ultrasound of the thyroid. It usually takes about 1-2 weeks to process and schedule this referral. If you have not heard from [place] regarding this appointment in 2 weeks please contact our office.   We recommend the following healthy lifestyle for LIFE: 1) Small portions.   Tip: eat off of a salad plate instead of a dinner plate.  Tip: if you need more or a snack choose fruits, veggies and/or a handful of nuts or seeds.  2) Eat a healthy clean diet.  * Tip: Avoid (less then 1 serving per week): processed foods, sweets, sweetened drinks, white starches (rice, flour, bread, potatoes, pasta, etc), red meat, fast foods, butter  *Tip: CHOOSE instead   * 5-9 servings per day of fresh or frozen fruits and vegetables (but not corn, potatoes, bananas, canned or dried fruit)   *nuts and seeds, beans   *olives and olive oil   *small portions of lean meats such as fish and white chicken    *small portions of whole grains  3)Get at least 150 minutes of sweaty aerobic exercise per week.  4)Reduce stress - consider counseling, meditation and relaxation to balance other aspects of your life.

## 2016-09-01 ENCOUNTER — Encounter: Payer: Medicare HMO | Admitting: Family Medicine

## 2016-09-03 ENCOUNTER — Ambulatory Visit
Admission: RE | Admit: 2016-09-03 | Discharge: 2016-09-03 | Disposition: A | Discharge status: Home / Self Care | Payer: Medicare HMO | Source: Ambulatory Visit | Attending: Family Medicine | Admitting: Family Medicine

## 2016-09-03 DIAGNOSIS — E0789 Other specified disorders of thyroid: Secondary | ICD-10-CM

## 2016-09-03 DIAGNOSIS — E042 Nontoxic multinodular goiter: Secondary | ICD-10-CM | POA: Diagnosis not present

## 2016-09-09 ENCOUNTER — Other Ambulatory Visit: Payer: Self-pay | Admitting: Family Medicine

## 2016-09-09 DIAGNOSIS — Z1231 Encounter for screening mammogram for malignant neoplasm of breast: Secondary | ICD-10-CM

## 2016-09-19 DIAGNOSIS — R69 Illness, unspecified: Secondary | ICD-10-CM | POA: Diagnosis not present

## 2016-09-24 ENCOUNTER — Ambulatory Visit
Admission: RE | Admit: 2016-09-24 | Discharge: 2016-09-24 | Disposition: A | Discharge status: Home / Self Care | Payer: Medicare HMO | Source: Ambulatory Visit | Attending: Family Medicine | Admitting: Family Medicine

## 2016-09-24 DIAGNOSIS — M810 Age-related osteoporosis without current pathological fracture: Secondary | ICD-10-CM | POA: Diagnosis not present

## 2016-09-24 DIAGNOSIS — Z780 Asymptomatic menopausal state: Secondary | ICD-10-CM | POA: Diagnosis not present

## 2016-09-24 DIAGNOSIS — Z1231 Encounter for screening mammogram for malignant neoplasm of breast: Secondary | ICD-10-CM | POA: Diagnosis not present

## 2016-10-24 ENCOUNTER — Other Ambulatory Visit: Payer: Self-pay | Admitting: Family Medicine

## 2016-12-24 DIAGNOSIS — Z961 Presence of intraocular lens: Secondary | ICD-10-CM | POA: Diagnosis not present

## 2016-12-24 DIAGNOSIS — H52203 Unspecified astigmatism, bilateral: Secondary | ICD-10-CM | POA: Diagnosis not present

## 2017-01-01 DIAGNOSIS — I10 Essential (primary) hypertension: Secondary | ICD-10-CM | POA: Diagnosis not present

## 2017-02-16 DIAGNOSIS — R69 Illness, unspecified: Secondary | ICD-10-CM | POA: Diagnosis not present

## 2017-04-18 IMAGING — CR DG CHEST 2V
2 series · 2 of 2 positions shown · non-contrast
Comparison: 12/18/2010

CLINICAL DATA: Cough and congestion

EXAM:
CHEST  2 VIEW

[w chest pa]
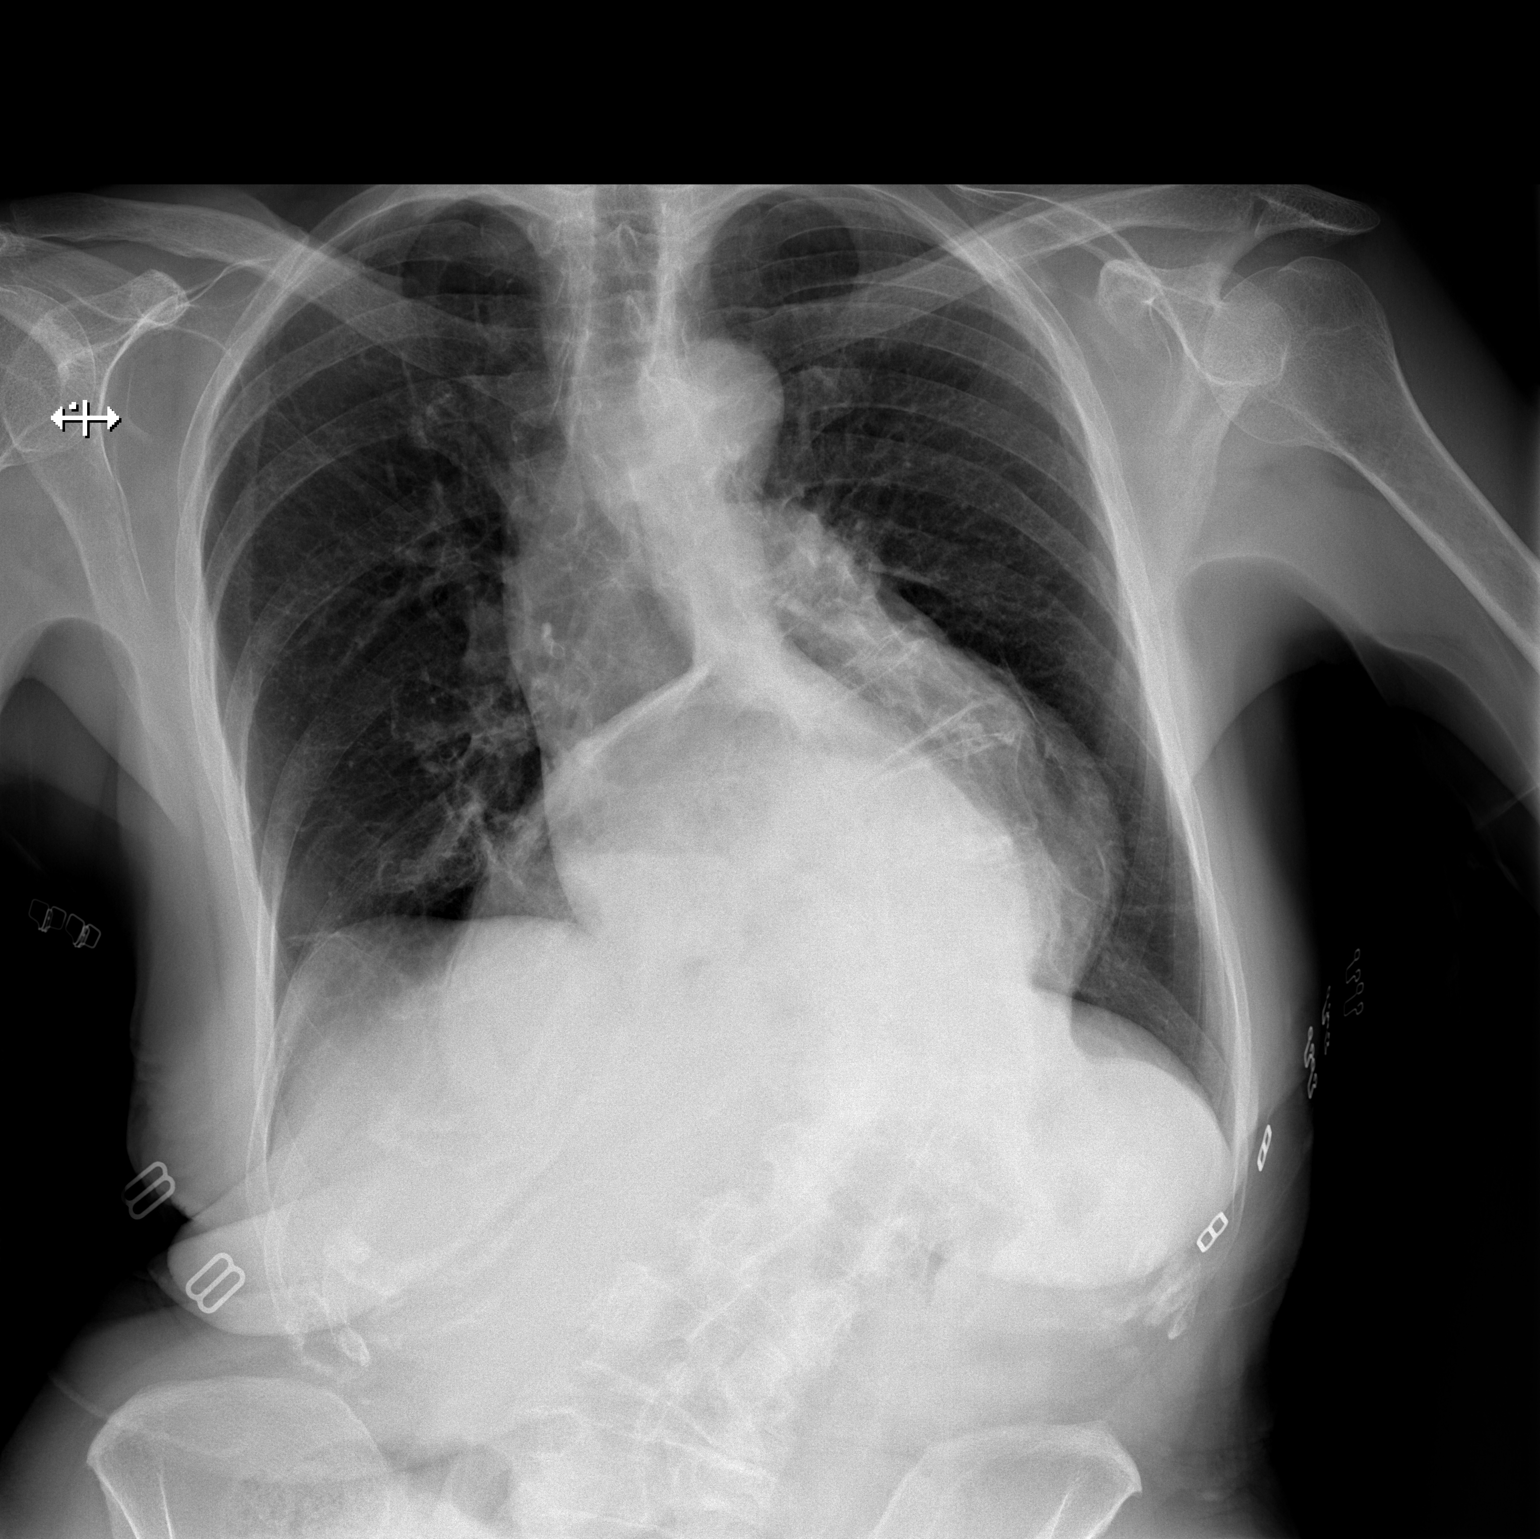

[w chest lat]
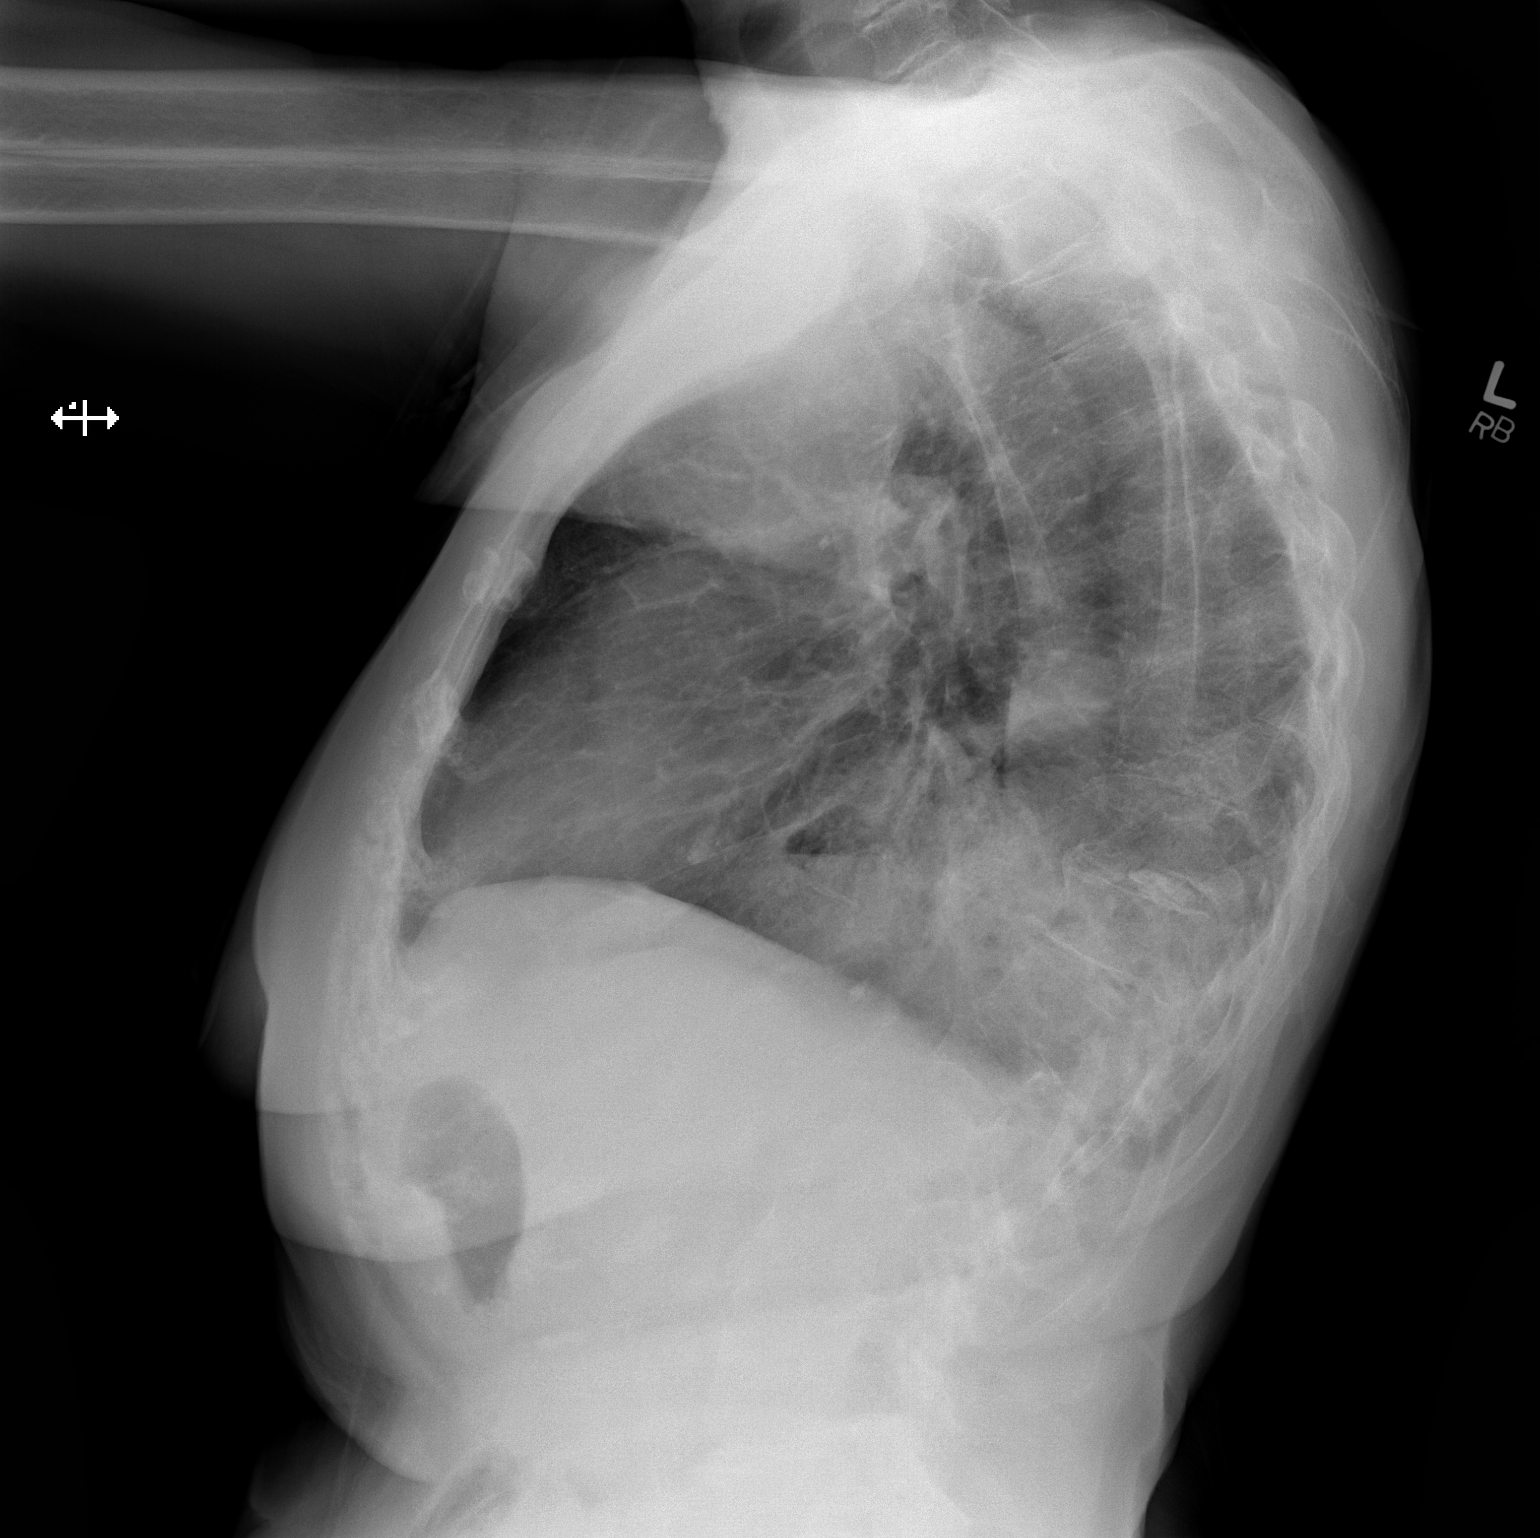

[2 of 2 positions shown; findings below may reference images not displayed]

FINDINGS: Marked levoscoliosis of the lower thoracic spine.

Large hiatal hernia with air-fluid level.

The lungs are clear without evidence of pneumonia. Negative for
heart failure or effusion.
IMPRESSION: No active cardiopulmonary disease.

## 2017-04-18 IMAGING — CT CT CHEST W/O CM
2 of 4 series · 15 of 36 positions shown, 18 images · non-contrast
Comparison: Radiographs 04/07/2015

CLINICAL DATA: Medication pill stuck in esophagus. Concern for
aspiration.

EXAM:
CT CHEST WITHOUT CONTRAST
TECHNIQUE: Multidetector CT imaging of the chest was performed following the
standard protocol without IV contrast.

[Series 2: chest w/o st · axial · non-contrast · 0.67mm/px · z∈[-127,+128]mm · 12 of 61 slices shown, 15 images]
[im 5/61  mediastinal]
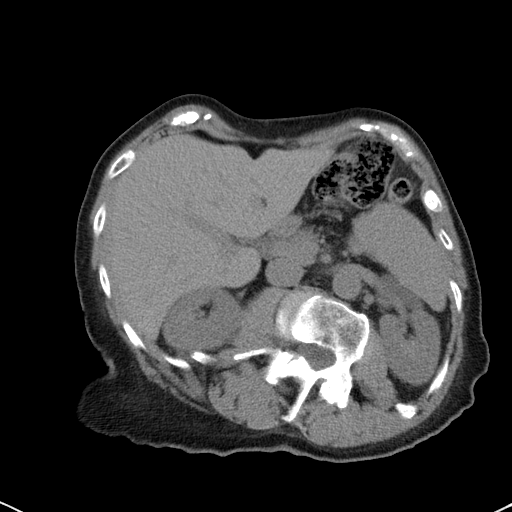
[im 5/61  lung]
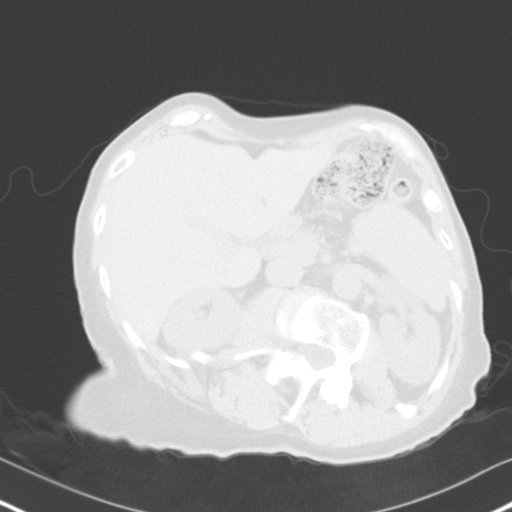
[im 10/61  lung]
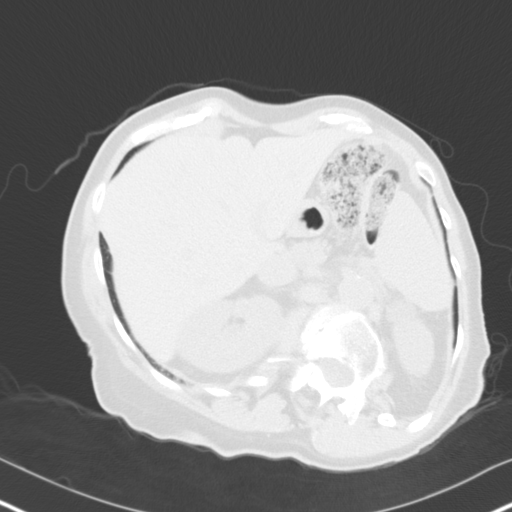
[im 14/61  lung]
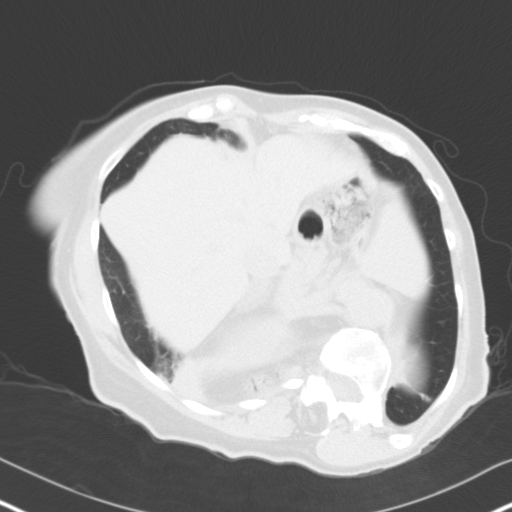
[im 19/61  lung]
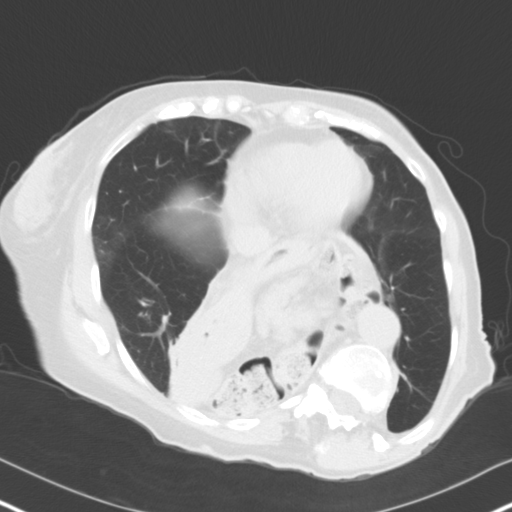
[im 24/61  mediastinal]
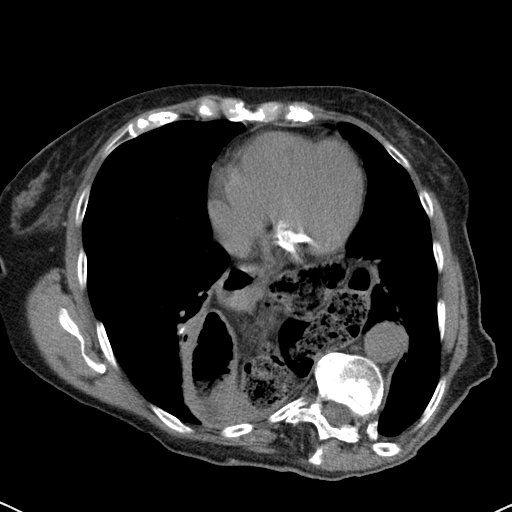
[im 24/61  lung]
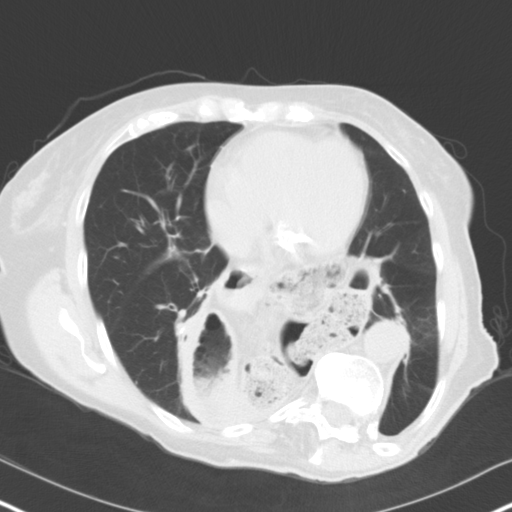
[im 28/61  lung]
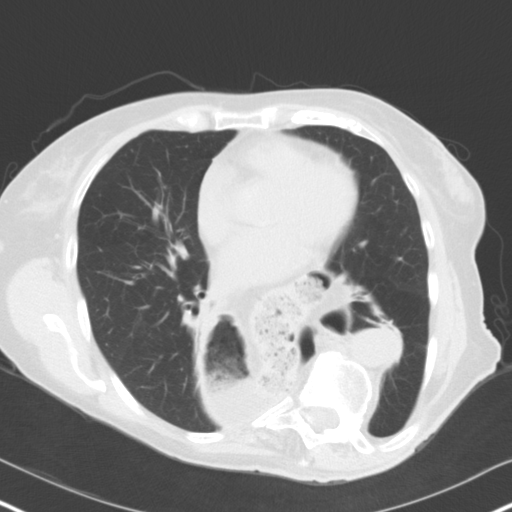
[im 33/61  lung]
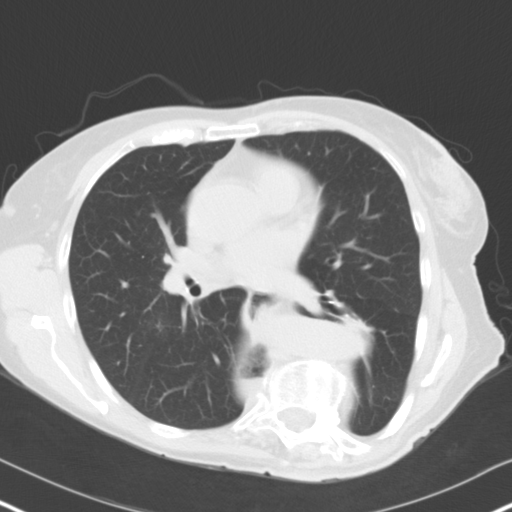
[im 37/61  lung]
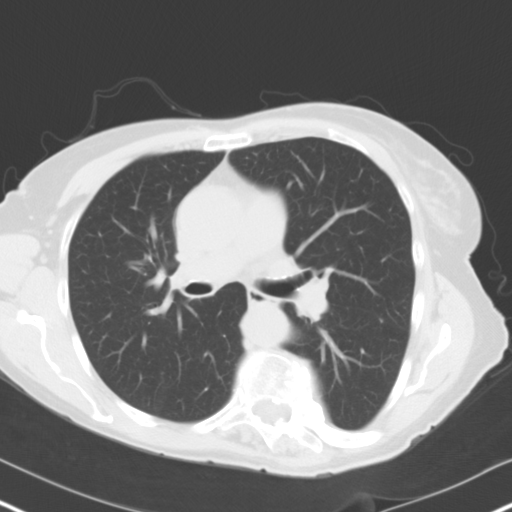
[im 42/61  mediastinal]
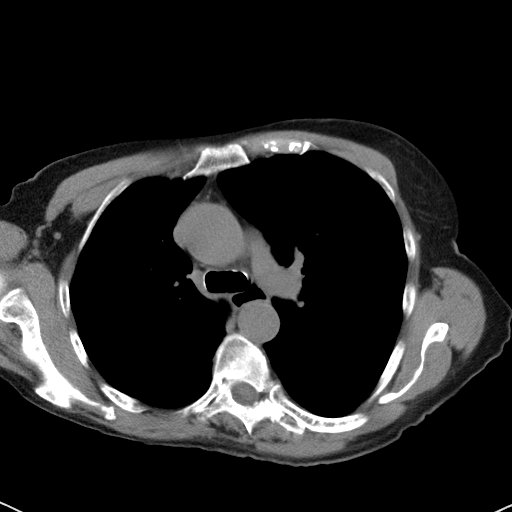
[im 42/61  lung]
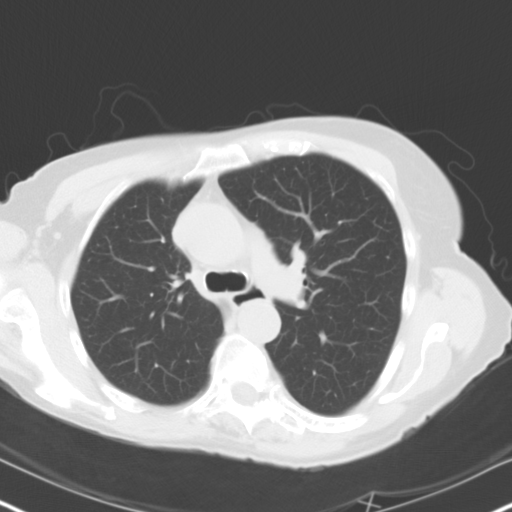
[im 47/61  lung]
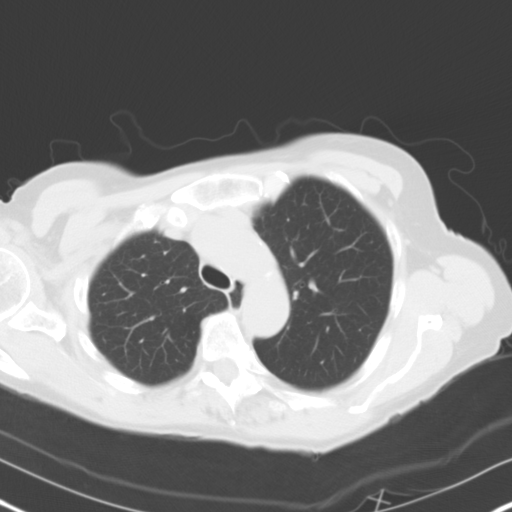
[im 51/61  lung]
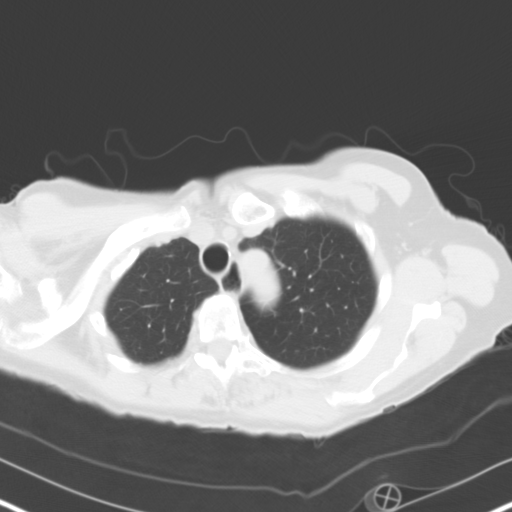
[im 56/61  lung]
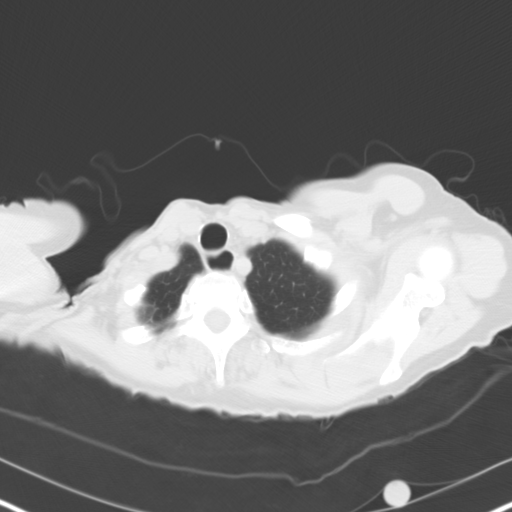

[Series 4: coronals · coronal · 0.59mm/px · 3 of 101 slices shown]
[im 21/101  lung]
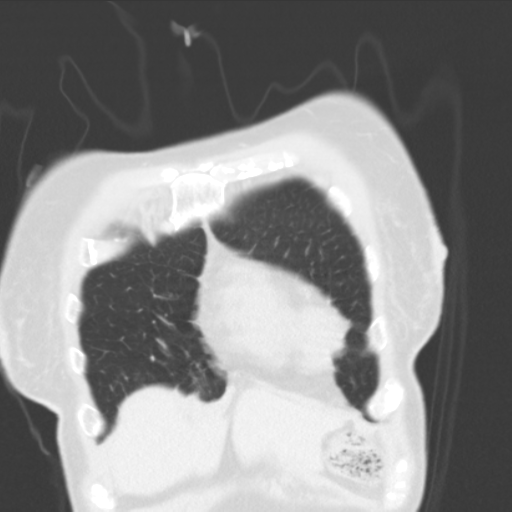
[im 41/101  lung]
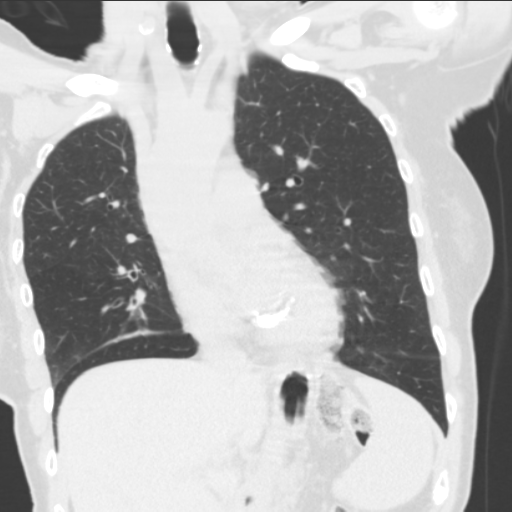
[im 61/101  lung]
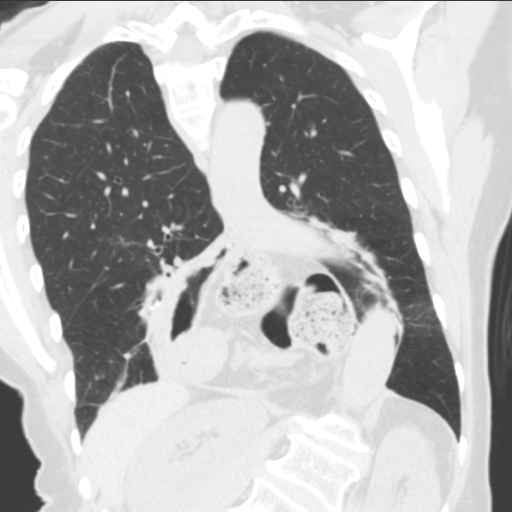

[15 of 36 positions shown; findings below may reference images not displayed]

FINDINGS: The lungs are clear except for minimal linear basilar scarring.
Central airways are patent. The esophagus is filled with air. There
is no esophageal foreign body. There is a very large diaphragmatic
hernia containing the EG junction, the stomach, a substantial
portion of transverse colon and a small portion of the pancreas.
There is no effusion. There is no mediastinal or hilar adenopathy.
There is no significant skeletal lesion. There is significant
kyphoscoliosis of the thoracic spine.
IMPRESSION: 1. No evidence of foreign body in the esophagus.
2. Large diaphragmatic hernia containing stomach, EG junction,
transverse colon and pancreas.
3. No acute findings are evident in the chest.

## 2017-05-05 DIAGNOSIS — M419 Scoliosis, unspecified: Secondary | ICD-10-CM | POA: Diagnosis not present

## 2017-05-13 DIAGNOSIS — H353121 Nonexudative age-related macular degeneration, left eye, early dry stage: Secondary | ICD-10-CM | POA: Diagnosis not present

## 2017-05-13 DIAGNOSIS — H353111 Nonexudative age-related macular degeneration, right eye, early dry stage: Secondary | ICD-10-CM | POA: Diagnosis not present

## 2017-05-13 DIAGNOSIS — H35722 Serous detachment of retinal pigment epithelium, left eye: Secondary | ICD-10-CM | POA: Diagnosis not present

## 2017-05-13 DIAGNOSIS — H35362 Drusen (degenerative) of macula, left eye: Secondary | ICD-10-CM | POA: Diagnosis not present
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
## 2017-05-18 ENCOUNTER — Encounter: Payer: Self-pay | Admitting: Interventional Cardiology

## 2017-05-18 ENCOUNTER — Ambulatory Visit (INDEPENDENT_AMBULATORY_CARE_PROVIDER_SITE_OTHER): Payer: Medicare HMO | Admitting: Interventional Cardiology

## 2017-05-18 VITALS — BP 128/86 | HR 69 | Ht 65.5 in | Wt 141.8 lb

## 2017-05-18 DIAGNOSIS — E785 Hyperlipidemia, unspecified: Secondary | ICD-10-CM

## 2017-05-18 DIAGNOSIS — I83893 Varicose veins of bilateral lower extremities with other complications: Secondary | ICD-10-CM | POA: Diagnosis not present

## 2017-05-18 DIAGNOSIS — I351 Nonrheumatic aortic (valve) insufficiency: Secondary | ICD-10-CM

## 2017-05-18 DIAGNOSIS — I480 Paroxysmal atrial fibrillation: Secondary | ICD-10-CM

## 2017-05-18 MED ORDER — DILTIAZEM HCL ER COATED BEADS 120 MG PO CP24: 120.0000 mg | ORAL_CAPSULE | Freq: Every day | ORAL | 3 refills | Status: DC

## 2017-05-18 NOTE — Patient Instructions (Signed)

## 2017-05-29 IMAGING — US US SOFT TISSUE HEAD/NECK
1 series · 14 of 25 positions shown · non-contrast
Comparison: None.

CLINICAL DATA: Neck swelling, thyroid nodule

EXAM:
THYROID ULTRASOUND
TECHNIQUE: Ultrasound examination of the thyroid gland and adjacent soft
tissues was performed.

[Series 1: us soft tissue head/neck · 0.07mm/px · 14 of 38 slices shown]
[im 1/38]
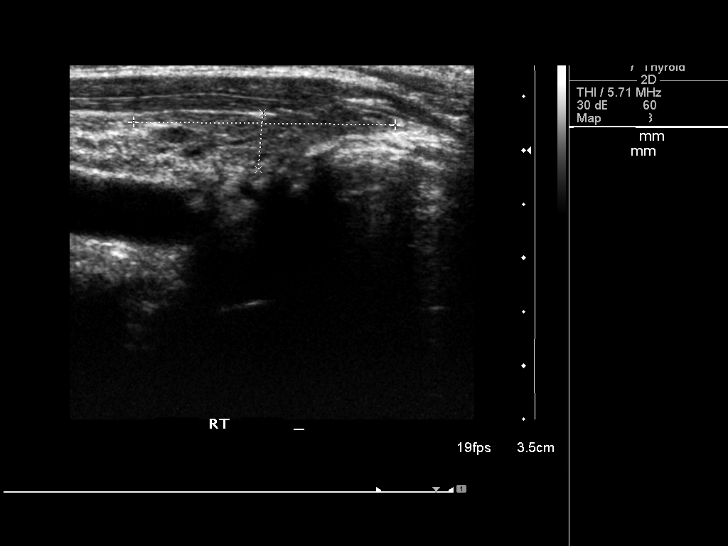
[im 4/38]
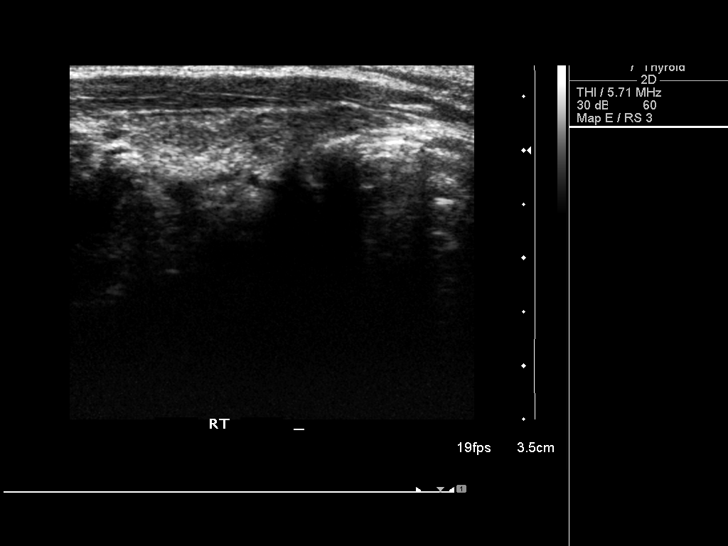
[im 7/38]
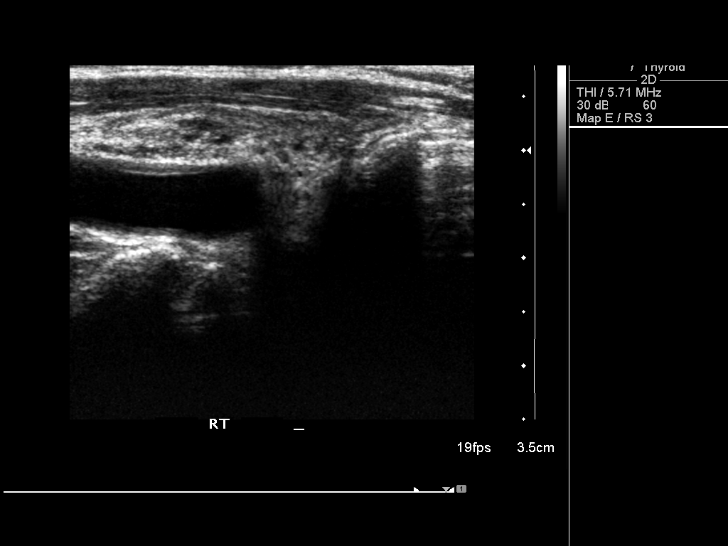
[im 10/38]
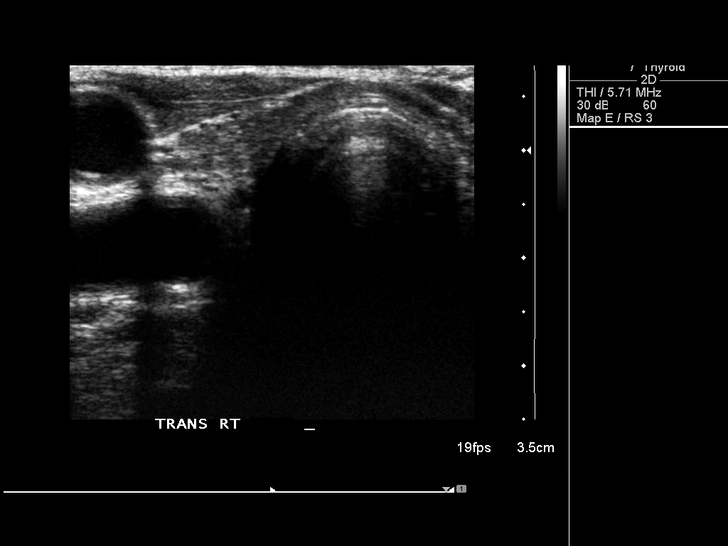
[im 13/38]
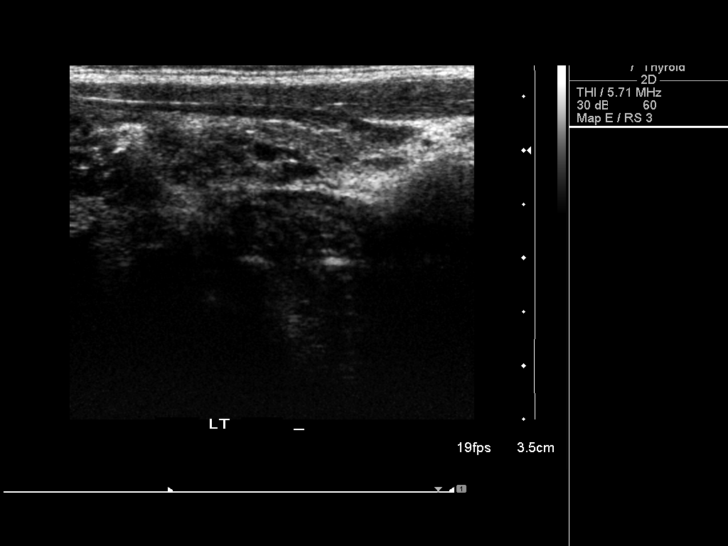
[im 14/38]
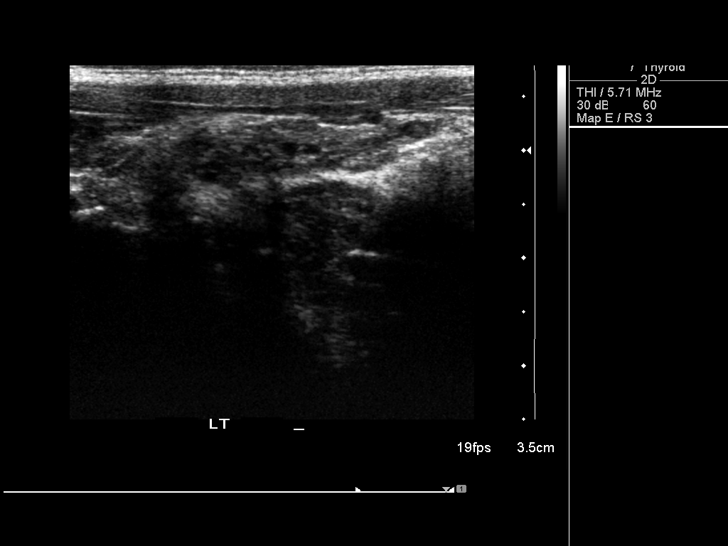
[im 17/38]
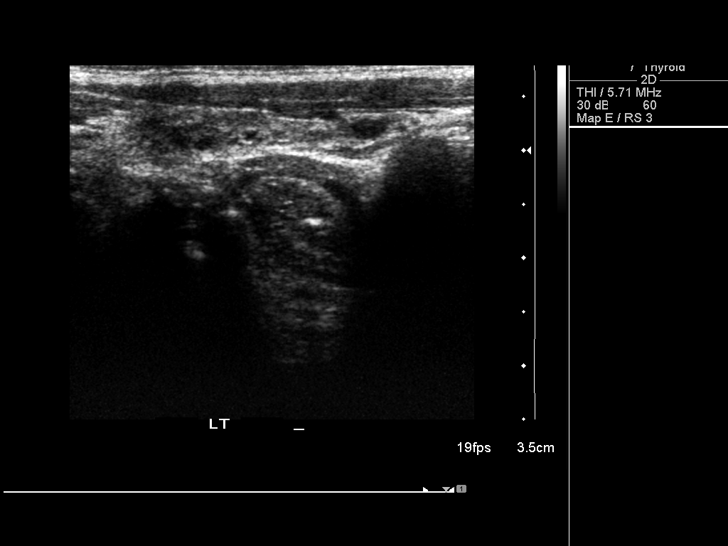
[im 21/38]
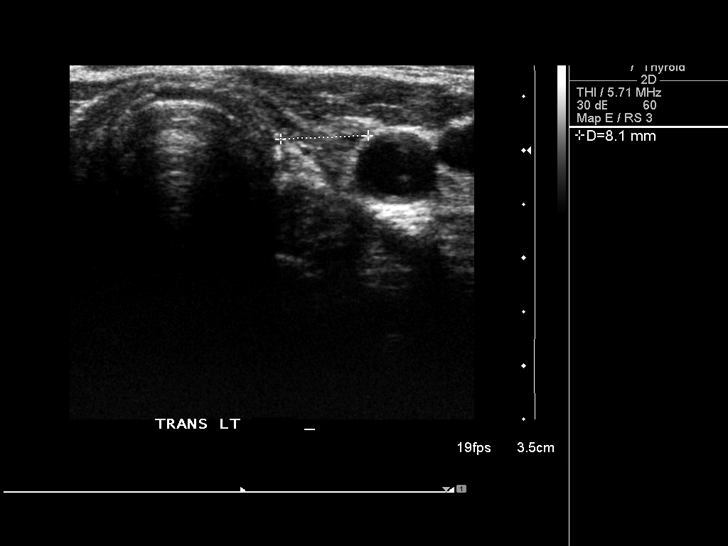
[im 24/38]
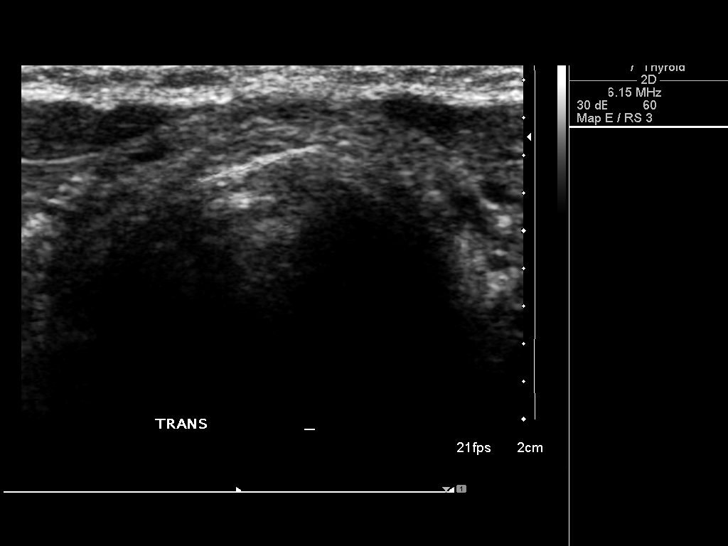
[im 25/38]
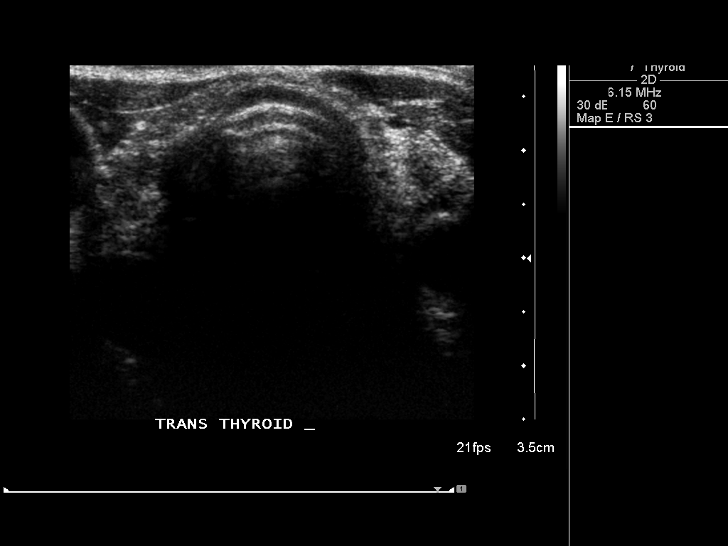
[im 28/38]
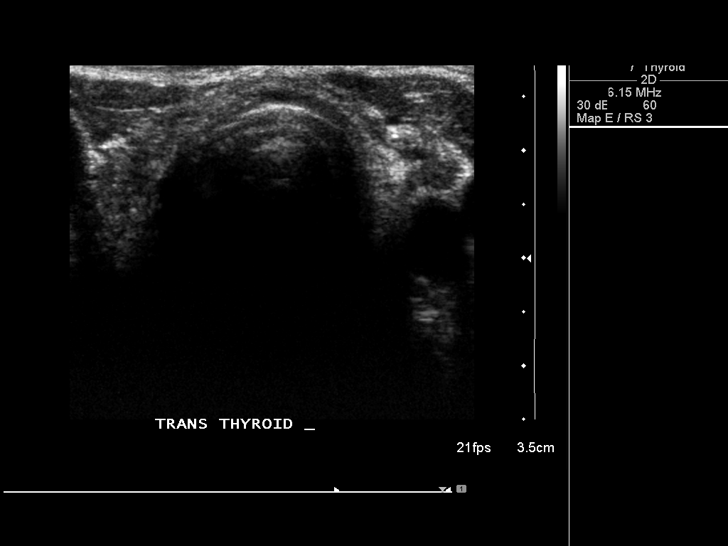
[im 31/38]
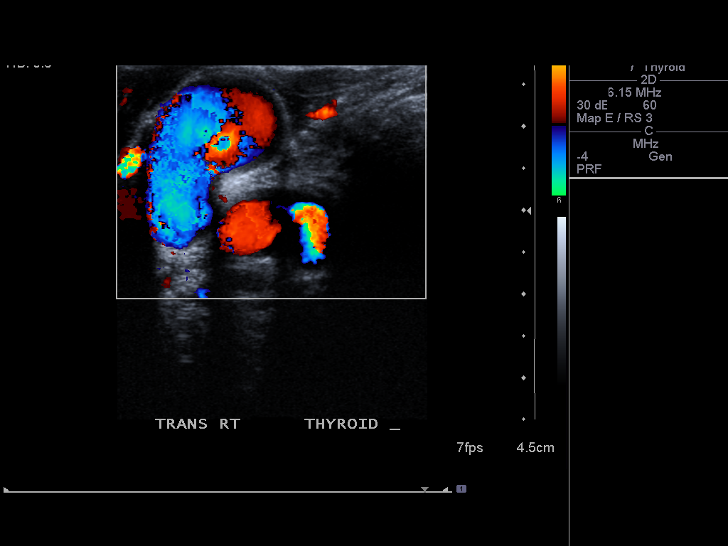
[im 34/38]
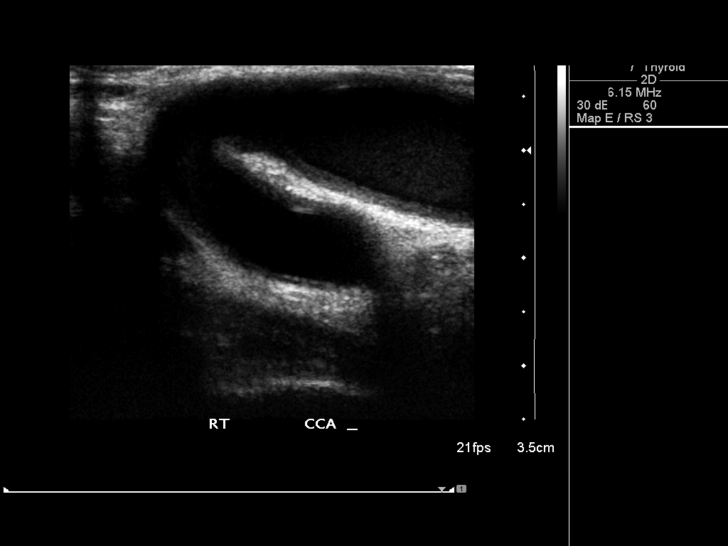
[im 38/38]
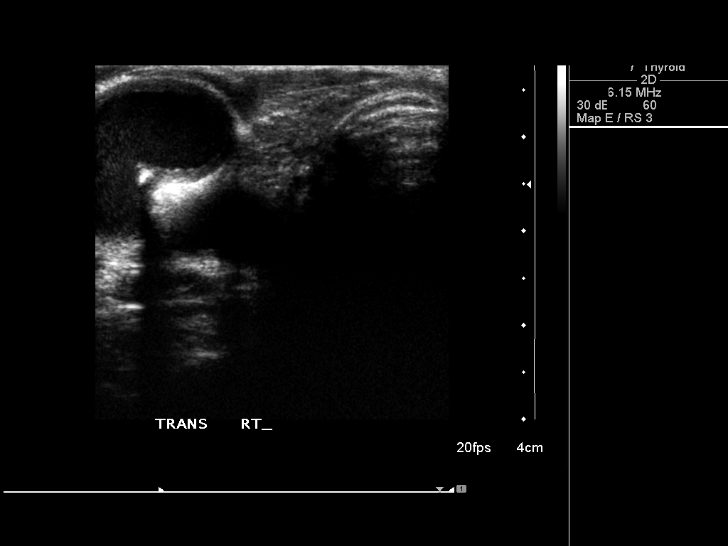

[14 of 25 positions shown; findings below may reference images not displayed]

FINDINGS: Right thyroid lobe

Measurements: 28 x 6 x 8 mm. Inhomogeneous echotexture without focal
lesion

Left thyroid lobe

Measurements: 25 x 6 x 8 mm.  No nodule visualized.

Isthmus

Thickness: 3 mm.  No nodules visualized.

Lymphadenopathy

None visualized.

No made of ectasia and tortuosity of right brachiocephalic and
common carotid arteries.
IMPRESSION: 1. Small inhomogeneous thyroid without nodule or focal lesion.

## 2017-06-04 DIAGNOSIS — J209 Acute bronchitis, unspecified: Secondary | ICD-10-CM | POA: Diagnosis not present

## 2017-06-04 DIAGNOSIS — R110 Nausea: Secondary | ICD-10-CM | POA: Diagnosis not present

## 2017-06-08 ENCOUNTER — Telehealth: Payer: Self-pay | Admitting: *Deleted

## 2017-06-08 NOTE — Telephone Encounter (Signed)
I left a detailed message at the pts cell number for her to call back to schedule an appt per Dr Maudie Mercury.

## 2017-06-08 NOTE — Telephone Encounter (Signed)
Patient called back and I informed her of the message below and offered an appt for her to see someone else as Dr Maudie Mercury does not have any openings today.  She stated she already talked to someone here earlier and they told her there were no openings with any provider for the next few days and she has an appt on Monday.  I offered an appt with Tommi Rumps tomorrow at 8:30am and the pt declined as she stated she has to get ready for her trip.

## 2017-06-08 NOTE — Telephone Encounter (Signed)
  I left a detailed message at the pts cell number for her to call back to schedule an appt per Dr Maudie Mercury. Stacy [name]      Copied from [place] [phone]. Topic: General - Other >> Jun 08, 2017 11:35 AM [name], Stacy E wrote: Reason for CRM: Patient has had insomnia for 3 weeks.  She is wondering what she could do or take for it.  She is going out of town tomorrow and is driving.  She states she went to urgent care and they recommended Melatonin.  She took 5 mgs and states she felt like she had a shot of caffeine from taking it.    Patient states Dr Maudie Mercury can respond to her on MyChart.

## 2017-06-08 NOTE — Telephone Encounter (Signed)
Recommend appointment for this

## 2017-06-15 ENCOUNTER — Ambulatory Visit (INDEPENDENT_AMBULATORY_CARE_PROVIDER_SITE_OTHER): Payer: Medicare HMO | Admitting: Family Medicine

## 2017-06-15 ENCOUNTER — Encounter: Payer: Self-pay | Admitting: Family Medicine

## 2017-06-15 ENCOUNTER — Ambulatory Visit: Payer: Medicare HMO | Admitting: Family Medicine

## 2017-06-15 VITALS — BP 146/84 | HR 70 | Temp 98.1°F | Wt 142.0 lb

## 2017-06-15 DIAGNOSIS — F5101 Primary insomnia: Secondary | ICD-10-CM | POA: Insufficient documentation

## 2017-06-15 DIAGNOSIS — R69 Illness, unspecified: Secondary | ICD-10-CM | POA: Diagnosis not present

## 2017-06-15 MED ORDER — LORAZEPAM 0.5 MG PO TABS: ORAL_TABLET | ORAL | 0 refills | Status: DC

## 2017-06-15 NOTE — Patient Instructions (Signed)
Ativan 0.5............ one half tab daily at bedtime when necessary  Make an appointment to see Dr. Maudie Mercury for further evaluation or if you prefer to see our in-house counselor.......[name] Kitchen Dr. Dorisann Frames.

## 2017-06-15 NOTE — Progress Notes (Signed)
Stacy [name] is a 76 year old single female who Him seeing is an acute because Dr. Maudie Mercury had to go for family emergency.  The patient states he's had insomnia for the past 4-6 weeks. She thinks it may be a stress-related phenomenon.  She states she came your last Monday and nobody would see her.  I explained that this required a complete evaluation and then I'm just subbing for Dr. Maudie Mercury for acute.. She's insistent that she have something for sleep. I explained that we did not feel that sleeping pills are appropriate however she still insistent on having something. I will give her a few Ativan 0.5 to take daily at bedtime  She is also inquiring about seeing a mental health professional.  BP (!) 146/84 (BP Location: Left Arm, Patient Position: Sitting, Cuff Size: Normal)   Pulse 70   Temp 98.1 F (36.7 C) (Oral)   Wt 142 lb (64.4 kg)   BMI 23.27 kg/m  In general she is well-developed well-nourished female in no acute distress  #1 insomnia......... at the patient's insistent I will give her a few Ativan to take until she can see Dr. Maudie Mercury for further evaluation

## 2017-06-22 DIAGNOSIS — R69 Illness, unspecified: Secondary | ICD-10-CM | POA: Diagnosis not present

## 2017-07-22 ENCOUNTER — Telehealth: Payer: Self-pay

## 2017-07-22 NOTE — Telephone Encounter (Signed)
Pt would like to switch care to Dr. Martinique -   Dr. Maudie Mercury - Please advise if ok for pt to change PCP. Thanks!   Copied from [place] [phone]. Topic: Inquiry >> Jul 21, 2017  3:24 PM Boyd Kerbs wrote: Reason for CRM: patient is calling to ask about changing doctors from Dr. Maudie Mercury and wants to see Dr. Martinique. Please let patient know about this.  She had ask at the front desk at her last appt. And has not heard anything. She is wanting to have a new patient appt with Dr. Martinique. She does state if she can not get into Dr. Martinique she would leave practice as she just did not seem to be a good fit with Dr. Maudie Mercury.

## 2017-07-22 NOTE — Telephone Encounter (Signed)
[place] with me. I think I already gave ok last time requested.

## 2017-07-23 NOTE — Telephone Encounter (Signed)
Dr. Martinique - ok for pt to transfer care to you? Thanks!

## 2017-07-24 NOTE — Telephone Encounter (Signed)
Fine with me

## 2017-08-10 ENCOUNTER — Encounter: Payer: Self-pay | Admitting: Family Medicine

## 2017-08-10 ENCOUNTER — Ambulatory Visit (INDEPENDENT_AMBULATORY_CARE_PROVIDER_SITE_OTHER): Payer: Medicare HMO | Admitting: Family Medicine

## 2017-08-10 VITALS — BP 124/78 | HR 86 | Temp 98.1°F | Resp 12 | Ht 65.5 in | Wt 141.5 lb

## 2017-08-10 DIAGNOSIS — R221 Localized swelling, mass and lump, neck: Secondary | ICD-10-CM

## 2017-08-10 DIAGNOSIS — I480 Paroxysmal atrial fibrillation: Secondary | ICD-10-CM | POA: Diagnosis not present

## 2017-08-10 DIAGNOSIS — F5101 Primary insomnia: Secondary | ICD-10-CM

## 2017-08-10 DIAGNOSIS — E039 Hypothyroidism, unspecified: Secondary | ICD-10-CM

## 2017-08-10 DIAGNOSIS — R1310 Dysphagia, unspecified: Secondary | ICD-10-CM | POA: Diagnosis not present

## 2017-08-10 DIAGNOSIS — R69 Illness, unspecified: Secondary | ICD-10-CM | POA: Diagnosis not present

## 2017-08-10 NOTE — Patient Instructions (Signed)
A few things to remember from today's visit:   Primary insomnia  Hypothyroidism, unspecified type  Paroxysmal atrial fibrillation (HCC)  No changes in medications.  Lorazepam tablets What is this medicine? LORAZEPAM (lor A ze pam) is a benzodiazepine. It is used to treat anxiety. This medicine may be used for other purposes; ask your health care provider or pharmacist if you have questions. COMMON BRAND NAME(S): Ativan What should I tell my health care provider before I take this medicine? They need to know if you have any of these conditions: -glaucoma -history of drug or alcohol abuse problem -kidney disease -liver disease -lung or breathing disease, like asthma -mental illness -myasthenia gravis -Parkinson's disease -suicidal thoughts, plans, or attempt; a previous suicide attempt by you or a family member -an unusual or allergic reaction to lorazepam, other medicines, foods, dyes, or preservatives -pregnant or trying to get pregnant -breast-feeding How should I use this medicine? Take this medicine by mouth with a glass of water. Follow the directions on the prescription label. Take your medicine at regular intervals. Do not take it more often than directed. Do not stop taking except on your doctor's advice. A special MedGuide will be given to you by the pharmacist with each prescription and refill. Be sure to read this information carefully each time. Talk to your pediatrician regarding the use of this medicine in children. While this drug may be used in children as young as 12 years for selected conditions, precautions do apply. Overdosage: If you think you have taken too much of this medicine contact a poison control center or emergency room at once. NOTE: This medicine is only for you. Do not share this medicine with others. What if I miss a dose? If you miss a dose, take it as soon as you can. If it is almost time for your next dose, take only that dose. Do not take  double or extra doses. What may interact with this medicine? Do not take this medicine with any of the following medications: -narcotic medicines for cough -sodium oxybate This medicine may also interact with the following medications: -alcohol -antihistamines for allergy, cough and cold -certain medicines for anxiety or sleep -certain medicines for depression, like amitriptyline, fluoxetine, sertraline -certain medicines for seizures like carbamazepine, phenobarbital, phenytoin, primidone -general anesthetics like lidocaine, pramoxine, tetracaine -MAOIs like Carbex, Eldepryl, Marplan, Nardil, and Parnate -medicines that relax muscles for surgery -narcotic medicines for pain -phenothiazines like chlorpromazine, mesoridazine, prochlorperazine, thioridazine This list may not describe all possible interactions. Give your health care provider a list of all the medicines, herbs, non-prescription drugs, or dietary supplements you use. Also tell them if you smoke, drink alcohol, or use illegal drugs. Some items may interact with your medicine. What should I watch for while using this medicine? Tell your doctor or health care professional if your symptoms do not start to get better or if they get worse. Do not stop taking except on your doctor's advice. You may develop a severe reaction. Your doctor will tell you how much medicine to take. You may get drowsy or dizzy. Do not drive, use machinery, or do anything that needs mental alertness until you know how this medicine affects you. To reduce the risk of dizzy and fainting spells, do not stand or sit up quickly, especially if you are an older patient. Alcohol may increase dizziness and drowsiness. Avoid alcoholic drinks. If you are taking another medicine that also causes drowsiness, you may have more side effects. Give your health  care provider a list of all medicines you use. Your doctor will tell you how much medicine to take. Do not take more  medicine than directed. Call emergency for help if you have problems breathing or unusual sleepiness. What side effects may I notice from receiving this medicine? Side effects that you should report to your doctor or health care professional as soon as possible: -allergic reactions like skin rash, itching or hives, swelling of the face, lips, or tongue -breathing problems -confusion -loss of balance or coordination -signs and symptoms of low blood pressure like dizziness; feeling faint or lightheaded, falls; unusually weak or tired -suicidal thoughts or other mood changes Side effects that usually do not require medical attention (report to your doctor or health care professional if they continue or are bothersome): -dizziness -headache -nausea, vomiting -tiredness This list may not describe all possible side effects. Call your doctor for medical advice about side effects. You may report side effects to FDA at 1-800-FDA-1088. Where should I keep my medicine? Keep out of the reach of children. This medicine can be abused. Keep your medicine in a safe place to protect it from theft. Do not share this medicine with anyone. Selling or giving away this medicine is dangerous and against the law. This medicine may cause accidental overdose and death if taken by other adults, children, or pets. Mix any unused medicine with a substance like cat litter or coffee grounds. Then throw the medicine away in a sealed container like a sealed bag or a coffee can with a lid. Do not use the medicine after the expiration date. Store at room temperature between 20 and 25 degrees C (68 and 77 degrees F). Protect from light. Keep container tightly closed. NOTE: This sheet is a summary. It may not cover all possible information. If you have questions about this medicine, talk to your doctor, pharmacist, or health care provider.  2018 Elsevier/Gold Standard (2015-04-19 15:54:27)  We have ordered labs or studies at this  visit.  It can take up to 1-2 weeks for results and processing. IF results require follow up or explanation, we will call you with instructions. Clinically stable results will be released to your [facility]. If you have not heard from [place] or cannot find your results in [facility] in 2 weeks please contact our office at [phone].  If you are not yet signed up for [facility], please consider signing up  Please be sure medication list is accurate. If a new problem present, please set up appointment sooner than planned today.
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
## 2017-08-11 ENCOUNTER — Encounter: Payer: Self-pay | Admitting: Family Medicine

## 2017-08-11 LAB — TSH: TSH: 1.22 u[IU]/mL (ref 0.35–4.50)

## 2017-08-12 ENCOUNTER — Encounter: Payer: Self-pay | Admitting: Family Medicine

## 2017-10-12 ENCOUNTER — Ambulatory Visit: Payer: Medicare HMO | Admitting: Psychology

## 2017-10-12 DIAGNOSIS — F411 Generalized anxiety disorder: Secondary | ICD-10-CM

## 2017-10-12 DIAGNOSIS — R69 Illness, unspecified: Secondary | ICD-10-CM | POA: Diagnosis not present

## 2017-10-21 DIAGNOSIS — R69 Illness, unspecified: Secondary | ICD-10-CM | POA: Diagnosis not present

## 2017-10-26 ENCOUNTER — Ambulatory Visit (INDEPENDENT_AMBULATORY_CARE_PROVIDER_SITE_OTHER): Payer: Medicare HMO | Admitting: Psychology

## 2017-10-26 DIAGNOSIS — R69 Illness, unspecified: Secondary | ICD-10-CM | POA: Diagnosis not present

## 2017-10-26 DIAGNOSIS — F411 Generalized anxiety disorder: Secondary | ICD-10-CM

## 2017-10-27 DIAGNOSIS — R69 Illness, unspecified: Secondary | ICD-10-CM | POA: Diagnosis not present

## 2017-10-29 DIAGNOSIS — R69 Illness, unspecified: Secondary | ICD-10-CM | POA: Diagnosis not present

## 2017-11-09 ENCOUNTER — Ambulatory Visit: Payer: Medicare HMO | Admitting: Psychology

## 2017-11-09 DIAGNOSIS — F411 Generalized anxiety disorder: Secondary | ICD-10-CM | POA: Diagnosis not present

## 2017-11-09 DIAGNOSIS — R69 Illness, unspecified: Secondary | ICD-10-CM | POA: Diagnosis not present

## 2017-11-23 ENCOUNTER — Ambulatory Visit: Payer: Medicare HMO | Admitting: Psychology

## 2017-11-23 DIAGNOSIS — F411 Generalized anxiety disorder: Secondary | ICD-10-CM | POA: Diagnosis not present

## 2017-11-23 DIAGNOSIS — R69 Illness, unspecified: Secondary | ICD-10-CM | POA: Diagnosis not present

## 2017-12-07 NOTE — Progress Notes (Signed)
HPI:   [name] is a 77 y.o. female, who is here today for CPE and Medicare preventive visit.   Last CPE 08/22/2015. Last AWV on 08/25/2016.  She was last seen on 08/10/17.  Chronic medical problems: Insomnia and anxiety, she has been prescribed Xanax 0.5 mg, which she has not started because she felt better and sleep has normalized.  Hypothyroidism Currently she is on levothyroxine 100 mcg once daily for 5 days, and a 11/2 tablet on Mondays and Thursdays    Lab Results  Component Value Date   TSH 1.22 08/10/2017   Hyperlipidemia: She is currently on nonpharmacologic treatment. Last lipid panel on 08/25/2016: TC 244, LDL 146, HDL 82, and TG 78.  Vitamin D deficiency and osteoporosis: Currently she is on vitamin D supplementation 2000 units daily. She is not on osteoporosis medication.  Paroxysmal atrial fibrillation: She follows with cardiologist.  Currently she is on diltiazem and Aspirin 81 mg daily. Last visit was on 06/15/2017.  She lives alone, she has family in [place]. She is planning on moving into a retirement community, she is waiting for the apartment to be ready. She has a power of attorney and a living will.  Independent ADL's and IADL's. No falls in the past year and denies depression symptoms. She exercises 3 times per week for about an hour. She follows a healthy diet.  No hearing loss. She wears reading eyeglasses.  Functional Status Survey: Is the patient deaf or have difficulty hearing?: No Does the patient have difficulty seeing, even when wearing glasses/contacts?: No Does the patient have difficulty concentrating, remembering, or making decisions?: No Does the patient have difficulty walking or climbing stairs?: No Does the patient have difficulty dressing or bathing?: No Does the patient have difficulty doing errands alone such as visiting a doctor's office or shopping?: No  Fall Risk  12/08/2017 08/25/2016 08/22/2015 08/18/2014    Falls in the past year? No No No No     Providers she sees regularly:  Eye care provider: She follows with ophthalmologist annually Dr Ellie Lunch, and with retina specialist annually, does not recall name.  Last eye exam about a year ago, she has an appointment later this month. Dr. Vladimir Faster, cardiologist.  She follows annually. She sees her dentist twice per year. ENT as needed for impacted cerumen, Dr. Janace Hoard.  Depression screen [facility] 2/9 12/08/2017  Decreased Interest 0  Down, Depressed, Hopeless 0  PHQ - 2 Score 0    Mini-Cog - 12/09/17 0808    Normal clock drawing test?  yes    How many words correct?  3      No exam data present Refused vision screening because she is seeing her ophthalmologist in about 2 weeks.   She has no concerns today.   Review of Systems  Constitutional: Negative for appetite change, fatigue and fever.  HENT: Negative for hearing loss, mouth sores, sore throat and trouble swallowing.   Eyes: Negative for redness and visual disturbance.  Respiratory: Negative for cough, shortness of breath and wheezing.   Cardiovascular: Negative for chest pain and leg swelling.  Gastrointestinal: Negative for abdominal pain, nausea and vomiting.       No changes in bowel habits.  Endocrine: Negative for cold intolerance, heat intolerance, polydipsia, polyphagia and polyuria.  Genitourinary: Negative for decreased urine volume, dysuria, hematuria, vaginal bleeding and vaginal discharge.  Musculoskeletal: Negative for arthralgias, gait problem and myalgias.  Skin: Negative for color change and rash.  Allergic/Immunologic: Positive  for environmental allergies.  Neurological: Negative for syncope, weakness and headaches.  Psychiatric/Behavioral: Negative for confusion and sleep disturbance. The patient is not nervous/anxious.   All other systems reviewed and are negative.    Current Outpatient Medications on File Prior to Visit  Medication Sig Dispense Refill  .  aspirin 81 MG tablet Take 81 mg by mouth daily.    . Cholecalciferol (VITAMIN D-3) 1000 UNITS CAPS Take 1,000 Units by mouth 2 (two) times daily.     [name] Kitchen diltiazem (CARTIA XT) 120 MG 24 hr capsule Take 1 capsule (120 mg total) by mouth daily. 90 capsule 3  . levothyroxine (SYNTHROID, LEVOTHROID) 100 MCG tablet TAKE ONE TABLET BY MOUTH ONCE DAILY BEFORE  BREAKFAST.  TAKE  AN  EXTRA  ONE-HALF  TABLET  ON  MONDAY  AND  THURSDAY 135 tablet 2  . LORazepam (ATIVAN) 0.5 MG tablet One half tab daily at bedtime when necessary 10 tablet 0  . Multiple Vitamin (MULTIVITAMIN) capsule Take 1 capsule by mouth daily.    . Multiple Vitamins-Minerals (PRESERVISION AREDS PO) Take 1 capsule by mouth 2 (two) times daily. Reported on 08/22/2015     No current facility-administered medications on file prior to visit.      Past Medical History:  Diagnosis Date  . Atrial fibrillation ([place])    echo 12/23/10 EF= >55%, stress myoview 01/30/11 normal pattern of perfusion in all myocardial regions  . Chicken pox   . Colon polyp   . Colon polyp   . Dyslipidemia   . Heart murmur   . Hiatal hernia   . Hypothyroidism   . Laryngopharyngeal reflux   . Osteopenia   . Scoliosis   . Seasonal allergies   . Thyroid disease   . Vitamin D deficiency    Allergies  Allergen Reactions  . Amoxicillin Swelling  . Flonase [Fluticasone Propionate] Swelling    Swelling of throat per patient Swelling of throat per patient  . Sulfa Antibiotics Other (See Comments)    Upset stomach  . Fluticasone Propionate Swelling    Swelling of throat per patient  . Sulfasalazine Other (See Comments)    Upset stomach    Social History   Socioeconomic History  . Marital status: Single    Spouse name: Not on file  . Number of children: Not on file  . Years of education: Not on file  . Highest education level: Not on file  Occupational History  . Occupation: retired  Scientific laboratory technician  . Financial resource strain: Not on file  . Food  insecurity:    Worry: Not on file    Inability: Not on file  . Transportation needs:    Medical: Not on file    Non-medical: Not on file  Tobacco Use  . Smoking status: Never Smoker  . Smokeless tobacco: Never Used  Substance and Sexual Activity  . Alcohol use: Yes    Alcohol/week: 0.6 oz    Types: 1 Glasses of wine per week    Comment: 1 glass of wine 3x a week  . Drug use: No  . Sexual activity: Not on file  Lifestyle  . Physical activity:    Days per week: Not on file    Minutes per session: Not on file  . Stress: Not on file  Relationships  . Social connections:    Talks on phone: Not on file    Gets together: Not on file    Attends religious service: Not on file  Active member of club or organization: Not on file    Attends meetings of clubs or organizations: Not on file    Relationship status: Not on file  Other Topics Concern  . Not on file  Social History Narrative   Work or School: [employer] Situation: lives alone      Spiritual Beliefs: Jewish      Lifestyle: [facility] [employer] [facility]; diet healthy             Vitals:   12/08/17 0937  BP: 122/70  Pulse: 69  Resp: 12  Temp: 97.6 F (36.4 C)  SpO2: 97%   Body mass index is 23.6 kg/m.   Physical Exam  Nursing note and vitals reviewed. Constitutional: She is oriented to person, place, and time. She appears well-developed and well-nourished. No distress.  HENT:  Head: Normocephalic and atraumatic.  Right Ear: Hearing and external ear normal.  Left Ear: Hearing and external ear normal.  Mouth/Throat: Uvula is midline, oropharynx is clear and moist and mucous membranes are normal.  Eyes: Pupils are equal, round, and reactive to light. Conjunctivae and EOM are normal.  Neck: No tracheal deviation present. No thyroid mass and no thyromegaly present.    Posttraumatic soft mass right side of neck, unchanged.  Cardiovascular: Normal rate and regular rhythm.  No murmur  heard. Pulses:      Dorsalis pedis pulses are 2+ on the right side, and 2+ on the left side.  Respiratory: Effort normal and breath sounds normal. No respiratory distress.  GI: Soft. She exhibits no mass. There is no hepatomegaly. There is no tenderness.  Musculoskeletal: She exhibits no edema.  Lymphadenopathy:    She has no cervical adenopathy.  Neurological: She is alert and oriented to person, place, and time. She has normal strength. No cranial nerve deficit. Coordination and gait normal.  Reflex Scores:      Bicep reflexes are 2+ on the right side and 2+ on the left side.      Patellar reflexes are 2+ on the right side and 2+ on the left side. Skin: Skin is warm. No rash noted. No erythema.  Psychiatric: She has a normal mood and affect. Cognition and memory are normal.  Well groomed, good eye contact.      ASSESSMENT AND PLAN:   [name] was seen today for CPE ,Medicare preventive visit, follow-up.  Orders Placed This Encounter  Procedures  . Basic metabolic panel  . Lipid panel  . VITAMIN D 25 Hydroxy (Vit-D Deficiency, Fractures)   Lab Results  Component Value Date   CREATININE 0.77 12/08/2017   BUN 19 12/08/2017   NA 139 12/08/2017   K 4.0 12/08/2017   CL 101 12/08/2017   CO2 30 12/08/2017   Lab Results  Component Value Date   CHOL 216 (H) 12/08/2017   HDL 74.90 12/08/2017   LDLCALC 129 (H) 12/08/2017   LDLDIRECT 148.1 08/17/2013   TRIG 63.0 12/08/2017   CHOLHDL 3 12/08/2017    Routine general medical examination at a health care facility  Preventive guidelines reviewed.  Aspirin to continue for secondary prevention, we discussed side effects. Ca++ through her diet ideally and vit D supplementation to continue. Next CPE in a year.  Medicare annual wellness visit, subsequent  We discussed the importance of staying active, physically and mentally, as well as the benefits of a healthy/balance diet. Low impact exercise that involve  stretching and strengthing  are ideal. Vaccination is not up-to-date, she is not interested in Prevnar 13 today.  We discussed preventive screening for the next 5-10 years, summery of recommendations given in AVS: Periodic eye exam and glaucoma screening. Diabetes screening every 3 to 5 years. Last colonoscopy in 09/2010. Last mammogram in 09/2016, she is now interested in having mammogram this year. Last DEXA in 09/2016,osteoporosis.  She is not on treatment for osteoporosis, she is not interested in doing so. Fall prevention.  Advance directives and end of life discussed, she has power of attorney and living with, copies in records.   Hypothyroidism, unspecified type  Well-controlled. Continue current management. Follow-up in 4 months.  Vitamin D deficiency  Continue vitamin D supplementation 2000 units daily. Further recommendation will be given according to lab results.  - VITAMIN D 25 Hydroxy (Vit-D Deficiency, Fractures)  Dyslipidemia  Continue nonpharmacologic treatment. Further recommendations will be given according to lipid panel results. Follow-up in 6 to 12 months.   - Basic metabolic panel - Lipid panel      -[name] was advised to return sooner than planned today if new concerns arise.       Tully Burgo G. Martinique, MD  [facility]. [place] office.

## 2017-12-08 ENCOUNTER — Encounter: Payer: Self-pay | Admitting: Family Medicine

## 2017-12-08 ENCOUNTER — Ambulatory Visit (INDEPENDENT_AMBULATORY_CARE_PROVIDER_SITE_OTHER): Payer: Medicare HMO | Admitting: Family Medicine

## 2017-12-08 VITALS — BP 122/70 | HR 69 | Temp 97.6°F | Resp 12 | Ht 65.5 in | Wt 144.0 lb

## 2017-12-08 DIAGNOSIS — E785 Hyperlipidemia, unspecified: Secondary | ICD-10-CM | POA: Diagnosis not present

## 2017-12-08 DIAGNOSIS — Z0000 Encounter for general adult medical examination without abnormal findings: Secondary | ICD-10-CM

## 2017-12-08 DIAGNOSIS — E039 Hypothyroidism, unspecified: Secondary | ICD-10-CM

## 2017-12-08 DIAGNOSIS — E559 Vitamin D deficiency, unspecified: Secondary | ICD-10-CM | POA: Diagnosis not present

## 2017-12-08 LAB — BASIC METABOLIC PANEL
BUN: 19 mg/dL (ref 6–23)
CO2: 30 meq/L (ref 19–32)
Calcium: 9.3 mg/dL (ref 8.4–10.5)
Chloride: 101 mEq/L (ref 96–112)
Creatinine, Ser: 0.77 mg/dL (ref 0.40–1.20)
GFR: 77.02 mL/min (ref >60.00)
GLUCOSE: 78 mg/dL (ref 70–99)
Potassium: 4 mEq/L (ref 3.5–5.1)
SODIUM: 139 meq/L (ref 135–145)

## 2017-12-08 LAB — LIPID PANEL
Cholesterol: 216 mg/dL — ABNORMAL HIGH (ref 0–200)
HDL: 74.9 mg/dL (ref >39.00)
LDL Cholesterol: 129 mg/dL — ABNORMAL HIGH (ref 0–99)
NONHDL: 141.18
Total CHOL/HDL Ratio: 3
Triglycerides: 63 mg/dL (ref 0.0–149.0)
VLDL: 12.6 mg/dL (ref 0.0–40.0)

## 2017-12-08 LAB — VITAMIN D 25 HYDROXY (VIT D DEFICIENCY, FRACTURES): VITD: 51.74 ng/mL (ref 30.00–100.00)

## 2017-12-08 NOTE — Patient Instructions (Signed)
A few things to remember from today's visit:   Hypothyroidism, unspecified type  Vitamin D deficiency - Plan: VITAMIN D 25 Hydroxy (Vit-D Deficiency, Fractures)  Dyslipidemia - Plan: Basic metabolic panel, Lipid panel   A few tips:  -As we age balance is not as good as it was, so there is a higher risks for falls. Please remove small rugs and furniture that is "in your way" and could increase the risk of falls. Stretching exercises may help with fall prevention: Yoga and Tai Chi are some examples. Low impact exercise is better, so you are not very achy the next day.  -Sun screen and avoidance of direct sun light recommended. Caution with dehydration, if working outdoors be sure to drink enough fluids.  - Some medications are not safe as we age, increases the risk of side effects and can potentially interact with other medication you are also taken;  including some of over the counter medications. Be sure to let me know when you start a new medication even if it is a dietary/vitamin supplement.   -Healthy diet low in red meet/animal fat and sugar + regular physical activity is recommended.       Screening schedule for the next 5-10 years:  Glaucoma screening/eye exam every 1 years.  Mammogram for breast cancer screening every 2 years if still want it.  Flu vaccine annually.  Diabetes and cholesterol screening   Fall prevention         Please be sure medication list is accurate. If a new problem present, please set up appointment sooner than planned today.

## 2017-12-09 ENCOUNTER — Encounter: Payer: Self-pay | Admitting: Family Medicine

## 2017-12-18 ENCOUNTER — Telehealth: Payer: Self-pay | Admitting: Family Medicine

## 2017-12-18 MED ORDER — LEVOTHYROXINE SODIUM 100 MCG PO TABS: ORAL_TABLET | ORAL | 2 refills | Status: DC

## 2017-12-18 NOTE — Telephone Encounter (Signed)
Copied from [place] [id number]. Topic: Quick Communication - Rx Refill/Question >> Dec 18, 2017  4:19 PM Percell Belt A wrote: Medication: levothyroxine (SYNTHROID, LEVOTHROID) 100 MCG tablet [[id number]]   Has the patient contacted their pharmacy? no (Agent: If no, request that the patient contact the pharmacy for the refill.) (Agent: If yes, when and what did the pharmacy advise?)  Preferred Pharmacy (with phone number or street name): [facility] on [address]   Agent: Please be advised that RX refills may take up to 3 business days. We ask that you follow-up with your pharmacy.

## 2017-12-23 DIAGNOSIS — H52203 Unspecified astigmatism, bilateral: Secondary | ICD-10-CM | POA: Diagnosis not present

## 2017-12-23 DIAGNOSIS — Z961 Presence of intraocular lens: Secondary | ICD-10-CM | POA: Diagnosis not present

## 2018-01-14 ENCOUNTER — Ambulatory Visit: Payer: Medicare HMO | Admitting: Psychology

## 2018-01-14 DIAGNOSIS — F411 Generalized anxiety disorder: Secondary | ICD-10-CM | POA: Diagnosis not present

## 2018-01-14 DIAGNOSIS — R69 Illness, unspecified: Secondary | ICD-10-CM | POA: Diagnosis not present

## 2018-02-19 ENCOUNTER — Ambulatory Visit: Payer: Medicare HMO | Admitting: Psychology

## 2018-02-19 DIAGNOSIS — R69 Illness, unspecified: Secondary | ICD-10-CM | POA: Diagnosis not present

## 2018-02-19 DIAGNOSIS — F411 Generalized anxiety disorder: Secondary | ICD-10-CM

## 2018-02-22 DIAGNOSIS — R69 Illness, unspecified: Secondary | ICD-10-CM | POA: Diagnosis not present

## 2018-03-10 ENCOUNTER — Ambulatory Visit: Payer: Medicare HMO | Admitting: Psychology

## 2018-03-10 DIAGNOSIS — R69 Illness, unspecified: Secondary | ICD-10-CM | POA: Diagnosis not present

## 2018-03-10 DIAGNOSIS — F411 Generalized anxiety disorder: Secondary | ICD-10-CM

## 2018-03-24 ENCOUNTER — Ambulatory Visit (INDEPENDENT_AMBULATORY_CARE_PROVIDER_SITE_OTHER): Payer: Medicare HMO | Admitting: Psychology

## 2018-03-24 DIAGNOSIS — R69 Illness, unspecified: Secondary | ICD-10-CM | POA: Diagnosis not present

## 2018-03-24 DIAGNOSIS — F411 Generalized anxiety disorder: Secondary | ICD-10-CM

## 2018-04-07 ENCOUNTER — Ambulatory Visit: Payer: Medicare HMO | Admitting: Psychology

## 2018-04-21 ENCOUNTER — Ambulatory Visit: Payer: Medicare HMO | Admitting: Psychology

## 2018-04-22 DIAGNOSIS — R69 Illness, unspecified: Secondary | ICD-10-CM | POA: Diagnosis not present

## 2018-04-28 DIAGNOSIS — H353132 Nonexudative age-related macular degeneration, bilateral, intermediate dry stage: Secondary | ICD-10-CM | POA: Diagnosis not present

## 2018-04-28 DIAGNOSIS — H40003 Preglaucoma, unspecified, bilateral: Secondary | ICD-10-CM | POA: Diagnosis not present

## 2018-04-28 DIAGNOSIS — H353111 Nonexudative age-related macular degeneration, right eye, early dry stage: Secondary | ICD-10-CM | POA: Diagnosis not present

## 2018-04-28 DIAGNOSIS — H43812 Vitreous degeneration, left eye: Secondary | ICD-10-CM | POA: Diagnosis not present

## 2018-04-28 DIAGNOSIS — H35722 Serous detachment of retinal pigment epithelium, left eye: Secondary | ICD-10-CM | POA: Diagnosis not present

## 2018-05-18 ENCOUNTER — Ambulatory Visit: Payer: Medicare HMO | Admitting: Physician Assistant

## 2018-05-18 ENCOUNTER — Encounter: Payer: Self-pay | Admitting: Physician Assistant

## 2018-05-18 VITALS — BP 148/84 | HR 65 | Ht 65.5 in | Wt 143.8 lb

## 2018-05-18 DIAGNOSIS — I351 Nonrheumatic aortic (valve) insufficiency: Secondary | ICD-10-CM | POA: Diagnosis not present

## 2018-05-18 DIAGNOSIS — E785 Hyperlipidemia, unspecified: Secondary | ICD-10-CM

## 2018-05-18 DIAGNOSIS — R0989 Other specified symptoms and signs involving the circulatory and respiratory systems: Secondary | ICD-10-CM | POA: Insufficient documentation

## 2018-05-18 DIAGNOSIS — I480 Paroxysmal atrial fibrillation: Secondary | ICD-10-CM | POA: Diagnosis not present

## 2018-05-18 DIAGNOSIS — R609 Edema, unspecified: Secondary | ICD-10-CM

## 2018-05-18 NOTE — Progress Notes (Signed)
Cardiology Office Note    Date:  05/18/2018   ID:  Stacy [name], DOB 09/03/1940, MRN [id number]  PCP:  Martinique, Betty G, MD  Cardiologist: Larae Grooms, MD EPS: None  Chief Complaint  Patient presents with  . Follow-up    History of Present Illness:  Stacy [name] is a 77 y.o. female history of atrial fibrillation in 2012 spontaneously converted to normal sinus rhythm within 24 hours.  She was on Xarelto on Coumadin but stopped due to need for testing.  Has declined anticoagulation in the past.  She has chronic lower extremity edema treated with compression stockings, varicose veins, mild aortic insufficiency, hyperlipidemia treated with lifestyle modification because of high HDL.  Stress Myoview in 2012 normal LVEF 55%.    Patient comes in for yearly f/u. No further Afib. Exercises 60 min 3 days/week. Works in yard. Edema doing well with compression stockings.  Denies any chest pain, palpitations, dyspnea, dyspnea on exertion, dizziness or presyncope.    Past Medical History:  Diagnosis Date  . Atrial fibrillation ([place])    echo 12/23/10 EF= >55%, stress myoview 01/30/11 normal pattern of perfusion in all myocardial regions  . Chicken pox   . Colon polyp   . Colon polyp   . Dyslipidemia   . Heart murmur   . Hiatal hernia   . Hypothyroidism   . Laryngopharyngeal reflux   . Osteopenia   . Scoliosis   . Seasonal allergies   . Thyroid disease   . Vitamin D deficiency     Past Surgical History:  Procedure Laterality Date  . BREAST CYST EXCISION Right 1988  . ESOPHAGEAL MANOMETRY N/A 08/29/2015   Procedure: ESOPHAGEAL MANOMETRY (EM);  Surgeon: Manus Gunning, MD;  Location: [facility];  Service: Gastroenterology;  Laterality: N/A;  . EYE SURGERY     Cataract eye surgery-right 06/2014, left 04/2014  . TONSILLECTOMY     Childhood    Current Medications: Current Meds  Medication Sig  . aspirin 81 MG tablet Take 81 mg by mouth daily.  . Cholecalciferol  (VITAMIN D-3) 1000 UNITS CAPS Take 1,000 Units by mouth 2 (two) times daily.   [name] Kitchen diltiazem (CARTIA XT) 120 MG 24 hr capsule Take 1 capsule (120 mg total) by mouth daily.  [name] Kitchen levothyroxine (SYNTHROID, LEVOTHROID) 100 MCG tablet TAKE ONE TABLET BY MOUTH ONCE DAILY BEFORE  BREAKFAST.  TAKE  AN  EXTRA  ONE-HALF  TABLET  ON  MONDAY  AND  THURSDAY  . LORazepam (ATIVAN) 0.5 MG tablet One half tab daily at bedtime when necessary  . Multiple Vitamin (MULTIVITAMIN) capsule Take 1 capsule by mouth daily.  . Multiple Vitamins-Minerals (PRESERVISION AREDS PO) Take 1 capsule by mouth 2 (two) times daily. Reported on 08/22/2015     Allergies:   Amoxicillin; Flonase [fluticasone propionate]; Sulfa antibiotics; Fluticasone propionate; and Sulfasalazine   Social History   Socioeconomic History  . Marital status: Single    Spouse name: Not on file  . Number of children: Not on file  . Years of education: Not on file  . Highest education level: Not on file  Occupational History  . Occupation: retired  Scientific laboratory technician  . Financial resource strain: Not on file  . Food insecurity:    Worry: Not on file    Inability: Not on file  . Transportation needs:    Medical: Not on file    Non-medical: Not on file  Tobacco Use  . Smoking status: Never Smoker  . Smokeless tobacco: Never  Used  Substance and Sexual Activity  . Alcohol use: Yes    Alcohol/week: 1.0 standard drinks    Types: 1 Glasses of wine per week    Comment: 1 glass of wine 3x a week  . Drug use: No  . Sexual activity: Not on file  Lifestyle  . Physical activity:    Days per week: Not on file    Minutes per session: Not on file  . Stress: Not on file  Relationships  . Social connections:    Talks on phone: Not on file    Gets together: Not on file    Attends religious service: Not on file    Active member of club or organization: Not on file    Attends meetings of clubs or organizations: Not on file    Relationship status: Not on file    Other Topics Concern  . Not on file  Social History Narrative   Work or School: [employer] Situation: lives alone      Spiritual Beliefs: Jewish      Lifestyle: [facility] [employer] [facility]; diet healthy              Family History:  The patient's family history includes Anuerysm in her father; CVA in her mother; Hyperlipidemia in her mother; Hypertension in her maternal grandmother, mother, and sister; Stroke in her maternal grandmother.   ROS:   Please see the history of present illness.    Review of Systems  Constitution: Negative.  HENT: Negative.   Eyes: Negative.   Cardiovascular: Positive for leg swelling.  Respiratory: Negative.   Hematologic/Lymphatic: Negative.   Musculoskeletal: Negative.  Negative for joint pain.  Gastrointestinal: Negative.   Genitourinary: Negative.   Neurological: Negative.    All other systems reviewed and are negative.   PHYSICAL EXAM:   VS:  BP (!) 148/84   Pulse 65   Ht 5' 5.5" (1.664 m)   Wt 143 lb 12.8 oz (65.2 kg)   BMI 23.57 kg/m   Physical Exam  GEN: Well nourished, well developed, in no acute distress  Neck: Right carotid artery is dilated and bulging, no bruit no JVD,  or masses Cardiac:RRR; 1/6 diastolic murmur left sternal border Respiratory:  clear to auscultation bilaterally, normal work of breathing GI: soft, nontender, nondistended, + BS Ext: without cyanosis, clubbing, or edema, Good distal pulses bilaterally Neuro:  Alert and Oriented x 3 Psych: euthymic mood, full affect  Wt Readings from Last 3 Encounters:  05/18/18 143 lb 12.8 oz (65.2 kg)  12/08/17 144 lb (65.3 kg)  08/10/17 141 lb 8 oz (64.2 kg)      Studies/Labs Reviewed:   EKG:  EKG is  ordered today.  The ekg ordered today demonstrates normal sinus rhythm no acute change  Recent Labs: 08/10/2017: TSH 1.22 12/08/2017: BUN 19; Creatinine, Ser 0.77; Potassium 4.0; Sodium 139   Lipid Panel    Component Value Date/Time   CHOL 216  (H) 12/08/2017 1025   TRIG 63.0 12/08/2017 1025   HDL 74.90 12/08/2017 1025   CHOLHDL 3 12/08/2017 1025   VLDL 12.6 12/08/2017 1025   LDLCALC 129 (H) 12/08/2017 1025   LDLDIRECT 148.1 08/17/2013 1143    Additional studies/ records that were reviewed today include:   2D echo 2014Study Conclusions  - Left ventricle: The cavity size was normal. There was mild   focal basal hypertrophy of the septum. Systolic function   was vigorous.  The estimated ejection fraction was in the   range of 65% to 70%. Wall motion was normal; there were no   regional wall motion abnormalities. Doppler parameters are   consistent with abnormal left ventricular relaxation   (grade 1 diastolic dysfunction). Doppler parameters are   consistent with high ventricular filling pressure. - Aortic valve: Mild regurgitation. Mean gradient: 60mm Hg   (S). Peak gradient: 51mm Hg (S). - Mitral valve: Calcified annulus. - Pulmonary arteries: Systolic pressure was mildly   increased. PA peak pressure: 80mm Hg (S). Transthoracic echocardiography.  M-mode, complete 2D, spectral Doppler, and color Doppler.  Height:  Height: 170.2cm. Height: 67in.  Weight:  Weight: 71.2kg. Weight: 156.7lb.  Body mass index:  BMI: 24.6kg/m^2.  Body surface area:    BSA: 1.59m^2.  Blood pressure:     145/84.  Patient status:  Outpatient.  Location:  [facility]    ASSESSMENT:    1. Paroxysmal atrial fibrillation (HCC)   2. Aortic valve insufficiency, etiology of cardiac valve disease unspecified   3. Dyslipidemia   4. Edema, unspecified type   5. Abnormal carotid pulse      PLAN:  In order of problems listed above:  Paroxysmal atrial fibrillation 2012.  Has declined anticoagulation in the past.  CHA2DS2-VASc equals 3 for female and age.  No further symptoms of atrial fibrillation.  Continue aspirin once daily.  Aortic valve insufficiency mild in 2014 very mild murmur on exam and asymptomatic.  Will not repeat  echo.  Dyslipidemia has been managed with lifestyle because of high HDL 74 12/2017 LDL 129 total cholesterol 216 triglycerides 63  Chronic lower extremity edema treated with compression stockings  Abnormal carotid pulse with dilatation.  Recommend carotid Dopplers    Medication Adjustments/Labs and Tests Ordered: Current medicines are reviewed at length with the patient today.  Concerns regarding medicines are outlined above.  Medication changes, Labs and Tests ordered today are listed in the Patient Instructions below. Patient Instructions  Medication Instructions:  Your physician recommends that you continue on your current medications as directed. Please refer to the Current Medication list given to you today.  If you need a refill on your cardiac medications before your next appointment, please call your pharmacy.   Lab work: None ordered  If you have labs (blood work) drawn today and your tests are completely normal, you will receive your results only by: [name] Kitchen MyChart Message (if you have MyChart) OR . A paper copy in the mail If you have any lab test that is abnormal or we need to change your treatment, we will call you to review the results.  Testing/Procedures: Your physician has requested that you have a carotid duplex. This test is an ultrasound of the carotid arteries in your neck. It looks at blood flow through these arteries that supply the brain with blood. Allow one hour for this exam. There are no restrictions or special instructions.    Follow-Up: At [facility], you and your health needs are our priority.  As part of our continuing mission to provide you with exceptional heart care, we have created designated Provider Care Teams.  These Care Teams include your primary Cardiologist (physician) and Advanced Practice Providers (APPs -  Physician Assistants and Nurse Practitioners) who all work together to provide you with the care you need, when you need it. You will  need a follow up appointment in 1 years.  Please call our office 2 months in advance to schedule this appointment.  You may see Larae Grooms, MD or one of the following Advanced Practice Providers on your designated Care Team:   [place], PA-C Melina Copa, PA-C . Ermalinda Barrios, PA-C  Any Other Special Instructions Will Be Listed Below (If Applicable).       Signed, Ermalinda Barrios, PA-C  05/18/2018 [facility] [address], [place], [place]  [address] Phone: [phone]; Fax: [phone]

## 2018-05-18 NOTE — Patient Instructions (Signed)
Medication Instructions:  Your physician recommends that you continue on your current medications as directed. Please refer to the Current Medication list given to you today.  If you need a refill on your cardiac medications before your next appointment, please call your pharmacy.   Lab work: None ordered  If you have labs (blood work) drawn today and your tests are completely normal, you will receive your results only by: [name] Kitchen MyChart Message (if you have MyChart) OR . A paper copy in the mail If you have any lab test that is abnormal or we need to change your treatment, we will call you to review the results.  Testing/Procedures: Your physician has requested that you have a carotid duplex. This test is an ultrasound of the carotid arteries in your neck. It looks at blood flow through these arteries that supply the brain with blood. Allow one hour for this exam. There are no restrictions or special instructions.    Follow-Up: At [facility], you and your health needs are our priority.  As part of our continuing mission to provide you with exceptional heart care, we have created designated Provider Care Teams.  These Care Teams include your primary Cardiologist (physician) and Advanced Practice Providers (APPs -  Physician Assistants and Nurse Practitioners) who all work together to provide you with the care you need, when you need it. You will need a follow up appointment in 1 years.  Please call our office 2 months in advance to schedule this appointment.  You may see Larae Grooms, MD or one of the following Advanced Practice Providers on your designated Care Team:   [place], PA-C Melina Copa, PA-C . Ermalinda Barrios, PA-C  Any Other Special Instructions Will Be Listed Below (If Applicable).

## 2018-05-19 ENCOUNTER — Telehealth: Payer: Self-pay | Admitting: Physician Assistant

## 2018-05-19 NOTE — Telephone Encounter (Signed)
Returned pts call several times, and phone just rings busy. Will try again later.

## 2018-05-19 NOTE — Telephone Encounter (Signed)
New Message   Patient is calling in reference to the recommendation of her having a carotid study. She has some additional questions and concerns.

## 2018-05-19 NOTE — Telephone Encounter (Signed)
Returned pts call.  She was wanting to go ahead and get the Carotid Doppler Study scheduled.  When she was getting dressed, she noticed the abnormal pulse in her neck and was a little concerned. Pt was assured if it was any danger, Selinda Eon would have made different recommendations and asked for the study to be done asap. Pt verbalized understanding.  I transferred pt to [facility] @ check out to get this scheduled for the pt. Pt thanked me for the call back.

## 2018-05-20 ENCOUNTER — Ambulatory Visit (HOSPITAL_COMMUNITY)
Admission: RE | Admit: 2018-05-20 | Discharge: 2018-05-20 | Disposition: A | Discharge status: Home / Self Care | Payer: Medicare HMO | Source: Ambulatory Visit | Attending: Cardiology | Admitting: Cardiology

## 2018-05-20 DIAGNOSIS — R0989 Other specified symptoms and signs involving the circulatory and respiratory systems: Secondary | ICD-10-CM | POA: Insufficient documentation

## 2018-05-24 ENCOUNTER — Telehealth: Payer: Self-pay

## 2018-05-24 DIAGNOSIS — I779 Disorder of arteries and arterioles, unspecified: Secondary | ICD-10-CM

## 2018-05-24 DIAGNOSIS — I739 Peripheral vascular disease, unspecified: Principal | ICD-10-CM

## 2018-05-24 NOTE — Telephone Encounter (Signed)
Left message for patient to call back  

## 2018-05-24 NOTE — Telephone Encounter (Signed)
-----   Message from Imogene Burn, [place] sent at 05/24/2018 11:31 AM EDT ----- Right carotid has 40 to 59% stenosis left 1 to 39%.  Will need repeat carotid Dopplers in 1 year.

## 2018-05-24 NOTE — Telephone Encounter (Addendum)
Patient made aware of results. Instructed the patient to continue with lifestyle modifications. Patient asking if she needs surgery. Made patient aware that her disease is not severe enough that warrants surgery. Patient wanting to hear recommendations from Ermalinda Barrios, PA and Dr. Irish Lack regarding what she could do about her pulsating carotid. She states that she does not want to live with it like that and that she can't wear low cut shirts anymore. Made patient aware that I would discuss with the providers and call her back with recommendations.

## 2018-05-27 NOTE — Telephone Encounter (Signed)
F/U Message ° ° ° ° ° ° ° ° ° °Patient returned your call °

## 2018-05-27 NOTE — Telephone Encounter (Signed)
Returned call to patient and made her aware that Dr. Irish Lack sauid that she could try a low dose of metoprolol 12.5 mg BID to see if that helped and Ermalinda Barrios, PA said that she could try lipitor 10 mg QD. Patient states that she was not interested in starting the metoprolol and does not want to take any statins. Patient states that she is going to see her PCP on Monday and discuss it further with them.

## 2018-05-31 ENCOUNTER — Ambulatory Visit (INDEPENDENT_AMBULATORY_CARE_PROVIDER_SITE_OTHER): Payer: Medicare HMO | Admitting: Family Medicine

## 2018-05-31 ENCOUNTER — Encounter: Payer: Self-pay | Admitting: Family Medicine

## 2018-05-31 ENCOUNTER — Ambulatory Visit: Payer: Self-pay

## 2018-05-31 VITALS — BP 126/82 | HR 80 | Temp 98.0°F | Resp 16 | Ht 65.5 in | Wt 142.1 lb

## 2018-05-31 DIAGNOSIS — Z23 Encounter for immunization: Secondary | ICD-10-CM | POA: Diagnosis not present

## 2018-05-31 DIAGNOSIS — I6521 Occlusion and stenosis of right carotid artery: Secondary | ICD-10-CM | POA: Diagnosis not present

## 2018-05-31 DIAGNOSIS — E785 Hyperlipidemia, unspecified: Secondary | ICD-10-CM

## 2018-05-31 DIAGNOSIS — R221 Localized swelling, mass and lump, neck: Secondary | ICD-10-CM | POA: Diagnosis not present

## 2018-05-31 MED ORDER — LOVASTATIN 10 MG PO TABS: 10.0000 mg | ORAL_TABLET | Freq: Every day | ORAL | 1 refills | Status: DC

## 2018-05-31 NOTE — Assessment & Plan Note (Signed)
We discussed diagnosis, prognosis, and treatment options. She is currently on Aspirin 81 mg daily, side effect discussed. She was initially reluctant to try statin medication due to concern about side effects, after discussion of benefits, she agrees with trying a low dose. Lovastatin were recommended by her cardiologist, so she will start with 10 mg daily. Follow-up in 4 months.

## 2018-05-31 NOTE — Progress Notes (Signed)
ACUTE VISIT   HPI:  Chief Complaint  Patient presents with  . Discuss previous medical procedure    [name] is a 77 y.o. female with Hx of HLD,atrial fib,and aortic insufficiency , who is here today concerned about recent carotid duplex done through her cardiologist office. According to patient, there was a concern about a pulsatile mass on right side of her neck, so [place] of carotid artery was ordered.   Right Carotid: Velocities in the right ICA are consistent with a 40-59% stenosis, low end of range. Right CCA is dilated with high velocities without plaque morphology.  Left Carotid: Velocities in the left ICA are consistent with a 1-39% stenosis. Non-hemodynamically significant plaque noted in the CCA.  Vertebrals: Bilateral vertebral arteries demonstrate antegrade flow. Subclavians: Normal flow hemodynamics were seen in bilateral subclavian arteries.   According to pt, statin medication were recommended, lovastatin, which she refused. She is afraid of having side effects with medication, she has done some research online and knows "statins may cause insomnia, muscle aches, diarrhea, and diabetes." She states that she feels good, independent ADL's and IADL's,so she is afraid of having serious side effects that affect her daily normal activities.   In prior visits right-sided neck abnormality, she reported having work-up in the past. It has been stable since first noted in 08/2017. Today she states that she does not remember having problem.  HLD, she is on non pharmacologic treatment,which was recommended based on CV risk factors at that time.  Lab Results  Component Value Date   CHOL 216 (H) 12/08/2017   HDL 74.90 12/08/2017   LDLCALC 129 (H) 12/08/2017   LDLDIRECT 148.1 08/17/2013   TRIG 63.0 12/08/2017   CHOLHDL 3 12/08/2017    Review of Systems  Constitutional: Negative for activity change, appetite change, fatigue, fever and unexpected weight  change.  HENT: Negative for mouth sores, nosebleeds and trouble swallowing.   Eyes: Negative for redness and visual disturbance.  Respiratory: Negative for cough, shortness of breath and wheezing.   Cardiovascular: Negative for chest pain, palpitations and leg swelling.  Gastrointestinal: Negative for abdominal pain, nausea and vomiting.       Negative for changes in bowel habits.  Genitourinary: Negative for decreased urine volume and hematuria.  Neurological: Negative for syncope, weakness and headaches.  Psychiatric/Behavioral: Negative for confusion. The patient is nervous/anxious.       Current Outpatient Medications on File Prior to Visit  Medication Sig Dispense Refill  . aspirin 81 MG tablet Take 81 mg by mouth daily.    . Cholecalciferol (VITAMIN D-3) 1000 UNITS CAPS Take 1,000 Units by mouth 2 (two) times daily.     [name] Kitchen diltiazem (CARTIA XT) 120 MG 24 hr capsule Take 1 capsule (120 mg total) by mouth daily. 90 capsule 3  . levothyroxine (SYNTHROID, LEVOTHROID) 100 MCG tablet TAKE ONE TABLET BY MOUTH ONCE DAILY BEFORE  BREAKFAST.  TAKE  AN  EXTRA  ONE-HALF  TABLET  ON  MONDAY  AND  THURSDAY 135 tablet 2  . LORazepam (ATIVAN) 0.5 MG tablet One half tab daily at bedtime when necessary 10 tablet 0  . Multiple Vitamin (MULTIVITAMIN) capsule Take 1 capsule by mouth daily.    . Multiple Vitamins-Minerals (PRESERVISION AREDS PO) Take 1 capsule by mouth 2 (two) times daily. Reported on 08/22/2015     No current facility-administered medications on file prior to visit.      Past Medical History:  Diagnosis Date  . Atrial  fibrillation ([place])    echo 12/23/10 EF= >55%, stress myoview 01/30/11 normal pattern of perfusion in all myocardial regions  . Chicken pox   . Colon polyp   . Colon polyp   . Dyslipidemia   . Heart murmur   . Hiatal hernia   . Hypothyroidism   . Laryngopharyngeal reflux   . Osteopenia   . Scoliosis   . Seasonal allergies   . Thyroid disease   . Vitamin D  deficiency    Allergies  Allergen Reactions  . Amoxicillin Swelling  . Flonase [Fluticasone Propionate] Swelling    Swelling of throat per patient Swelling of throat per patient  . Sulfa Antibiotics Other (See Comments)    Upset stomach  . Fluticasone Propionate Swelling    Swelling of throat per patient  . Sulfasalazine Other (See Comments)    Upset stomach    Social History   Socioeconomic History  . Marital status: Single    Spouse name: Not on file  . Number of children: Not on file  . Years of education: Not on file  . Highest education level: Not on file  Occupational History  . Occupation: retired  Scientific laboratory technician  . Financial resource strain: Not on file  . Food insecurity:    Worry: Not on file    Inability: Not on file  . Transportation needs:    Medical: Not on file    Non-medical: Not on file  Tobacco Use  . Smoking status: Never Smoker  . Smokeless tobacco: Never Used  Substance and Sexual Activity  . Alcohol use: Yes    Alcohol/week: 1.0 standard drinks    Types: 1 Glasses of wine per week    Comment: 1 glass of wine 3x a week  . Drug use: No  . Sexual activity: Not on file  Lifestyle  . Physical activity:    Days per week: Not on file    Minutes per session: Not on file  . Stress: Not on file  Relationships  . Social connections:    Talks on phone: Not on file    Gets together: Not on file    Attends religious service: Not on file    Active member of club or organization: Not on file    Attends meetings of clubs or organizations: Not on file    Relationship status: Not on file  Other Topics Concern  . Not on file  Social History Narrative   Work or School: [employer] Situation: lives alone      Spiritual Beliefs: Jewish      Lifestyle: [facility] [employer] [facility]; diet healthy             Vitals:   05/31/18 1420  BP: 126/82  Pulse: 80  Resp: 16  Temp: 98 F (36.7 C)  SpO2: 94%   Body mass index is 23.29  kg/m.   Physical Exam  Nursing note and vitals reviewed. Constitutional: She is oriented to person, place, and time. She appears well-developed and well-nourished. No distress.  HENT:  Head: Normocephalic and atraumatic.  Mouth/Throat: Oropharynx is clear and moist and mucous membranes are normal.  Eyes: Pupils are equal, round, and reactive to light. Conjunctivae are normal.  Neck: Carotid bruit is not present.    Pulsatile mass seen again anterior right side of neck.  Cardiovascular: Normal rate and regular rhythm.  No murmur heard. Pulses:      Dorsalis pedis  pulses are 2+ on the right side, and 2+ on the left side.  Respiratory: Effort normal and breath sounds normal. No respiratory distress.  GI: Soft. She exhibits no mass. There is no hepatomegaly. There is no tenderness.  Musculoskeletal: She exhibits no edema.  Lymphadenopathy:    She has no cervical adenopathy.  Neurological: She is alert and oriented to person, place, and time. She has normal strength. No cranial nerve deficit. Gait normal.  Skin: Skin is warm. No rash noted. No erythema.  Psychiatric: Her mood appears anxious.  Well groomed, good eye contact.     ASSESSMENT AND PLAN:  [name] was seen today for discuss previous medical procedure.  Diagnoses and all orders for this visit:  Carotid artery stenosis, asymptomatic, right  We discussed possible complications and benefits of statin medications for CVD prevention. After discussion side effects as well as risk vs benefit,she agrees with trying Lovasattin but low dose.  -     lovastatin (MEVACOR) 10 MG tablet; Take 1 tablet (10 mg total) by mouth at bedtime.  Dyslipidemia  Lovastatin 10 mg daily started. If any side effects she can also take it every other day. Continue low fat diet. F/U in 3-4 months.  -     lovastatin (MEVACOR) 10 MG tablet; Take 1 tablet (10 mg total) by mouth at bedtime.  Pulsatile neck mass Seems to be dilation of  CCA. We will continue following.  Encounter for immunization -     Flu vaccine HIGH DOSE PF     4:41-3:15 pm face to face OV. > 50% was dedicated to discussion of Dx, prognosis, treatment options, and side effects of medications.   Return in about 4 months (around 10/01/2018).      Stacy Lubrano G. Martinique, MD  [facility]. [place] office.

## 2018-05-31 NOTE — Patient Instructions (Addendum)
A few things to remember from today's visit:   Dyslipidemia - Plan: lovastatin (MEVACOR) 10 MG tablet  Carotid artery stenosis, asymptomatic, right - Plan: lovastatin (MEVACOR) 10 MG tablet   Please be sure medication list is accurate. If a new problem present, please set up appointment sooner than planned today.

## 2018-05-31 NOTE — Assessment & Plan Note (Signed)
Because no diagnosis of carotid artery stenosis, LDL goal is now <100. After discussion of side effects of statins as well as CV benefits, she finally agrees with trying lovastatin 10 mg daily. Continue low-fat diet. We will recheck lipid panel in 09/2018.

## 2018-05-31 NOTE — Assessment & Plan Note (Signed)
It has been noted during prior visits and seems to be stable. We will review prior visits, when this pulsatile mass was discussed but she does not remember having this conversation. It seems to be stable, so for now I recommend continue monitoring for changes.

## 2018-06-03 ENCOUNTER — Telehealth: Payer: Self-pay | Admitting: *Deleted

## 2018-06-03 NOTE — Telephone Encounter (Signed)
Please advise 

## 2018-06-03 NOTE — Telephone Encounter (Signed)
Copied from [place] [id number]. Topic: General - Inquiry >> Jun 03, 2018  1:29 PM Judyann Munson wrote: Reason for CRM: Patient is calling to advise the medication lovastatin (MEVACOR) 10 MG tablet was on hold due to a interaction with her current medication diltiazem (CARTIA XT) 120 MG 24 hr capsule. She is requesting a call back in regards to what she needs to take. Her best contact [phone].

## 2018-06-04 NOTE — Telephone Encounter (Signed)
Spoke with patient and she stated that if all statin's are in the same family and she is concerned with the interactions with her current medications. Patient wants to make sure it okay for her to start any new medications. Patient suggested that PCP and Cardiologists speak concerning the statin. Please advise.

## 2018-06-04 NOTE — Telephone Encounter (Signed)
I thought we discussed this during her last visit, usually it is simvastatin day 1 I do not recommend to combine with amlodipine or diltiazem. I recommend trying, she is going to take a very low dose. If she is still concerned about possibility of medication interaction, she can try pravastatin 10 mg daily. Thanks, BJ

## 2018-06-04 NOTE — Telephone Encounter (Signed)
We already had a long discussion about some side effects as well as benefits from statins.  I recommended trying statin medication for CVD prevention. It was my understanding that her cardiologist also recommended statin medication, lovastatin.  If she is still concerned about side effects, she can choose to continue nonpharmacologic treatment.  Thanks, BJ

## 2018-06-07 NOTE — Telephone Encounter (Signed)
Patient given recommendations per Dr. Martinique. No further assistance needed at this time.

## 2018-06-11 NOTE — Telephone Encounter (Signed)
Pt calling back about taking a statin drug that Martinique was going to call her cardiologist about a drug to take she would like a call back after 2 today she would like to know why this has been delayed she has called her pharmacy and they states that they still doesn't have anything from Martinique please her back at [phone]

## 2018-06-14 ENCOUNTER — Other Ambulatory Visit: Payer: Self-pay | Admitting: *Deleted

## 2018-06-14 MED ORDER — PRAVASTATIN SODIUM 10 MG PO TABS: 10.0000 mg | ORAL_TABLET | Freq: Every day | ORAL | 3 refills | Status: DC

## 2018-06-14 NOTE — Telephone Encounter (Signed)
Rx for Pravastatin sent to pharmacy.

## 2018-06-14 NOTE — Telephone Encounter (Signed)
Spoke with patient and gave directions per Dr. Martinique. Patient also spoke with Dr. Martinique on the phone and given directions as well. Patient verbalized understanding.

## 2018-06-14 NOTE — Telephone Encounter (Signed)
Spoke with patient and she stated that the pharmacy needs approval for her to take the Lovastatin with the Cartia because of the interactions. Patient wants PCP to decide whether or not it is safe for her to take the Statin. Please Advise, would like to know for sure by the end of day.

## 2018-06-14 NOTE — Telephone Encounter (Signed)
I think she can take Lovastatin but we can try a different statin, Pravastatin low dose (10 mg daily), with has similar benefits and may be better tolerated. Thanks, BJ

## 2018-06-15 NOTE — Telephone Encounter (Signed)
06/14/18 at 2:53 pm I spoke with patient and discussed some side effects of statin medications in general. Even though I think it is safe to take lovastatin 10 mg along with the Cardizem;given her anxiety/concern, I recommended trying pravastatin 10 mg instead. She was reassured. She will call back if she has more questions.   Martinique, MD

## 2018-07-05 ENCOUNTER — Telehealth: Payer: Self-pay | Admitting: Family Medicine

## 2018-07-05 NOTE — Telephone Encounter (Signed)
Message sent to Dr. Jordan for review. 

## 2018-07-05 NOTE — Telephone Encounter (Signed)
She can certainly try to take medication in the morning, it may be better tolerated. Thanks, BJ

## 2018-07-05 NOTE — Telephone Encounter (Signed)
Patient states she started taking a statin and is just now starting to have trouble sleeping and is having an upset stomach in the morning.  She was requesting to talk to someone about possibly taking the medicine earlier in the day or making a med adjustment of some kind to help with the side effects.    Patient states she may not be reachable part of the day but was requesting a call back today.

## 2018-07-05 NOTE — Telephone Encounter (Signed)
Spoke with patient and informed her that it was okay to take statin in the morning as long as it was taking an hour apart from her Thyroid medication per Dr. Martinique. Patient verbalized understanding.

## 2018-07-12 ENCOUNTER — Other Ambulatory Visit: Payer: Self-pay | Admitting: Interventional Cardiology

## 2018-07-12 ENCOUNTER — Ambulatory Visit: Payer: Self-pay

## 2018-07-12 MED ORDER — DILTIAZEM HCL ER COATED BEADS 120 MG PO CP24: 120.0000 mg | ORAL_CAPSULE | Freq: Every day | ORAL | 3 refills | Status: DC

## 2018-07-12 NOTE — Telephone Encounter (Signed)
Patient called and says "I woke up this morning with mild diarrhea. I took an OTC anti-diarrhea medicine that I've taken for years. The diarrhea stopped. I took my statin medicine and went to the gym. About 2 hours later, I started feeling weak, achy, and dizzy, so I came home. Once I got home, I drank me some water and I started feeling better. I spoke with the pharmacist and was told diarrhea is a side effect of my statin medicine. My concern is the diarrhea I had was caused by the statin and I got dehydrated a little. I don't know what to do, because I need to take the statin." I advised I will send this information to Dr. Martinique for review and someone will call back with her recommendation, patient verbalized understanding.  Reason for Disposition . Caller has NON-URGENT medication question about med that PCP prescribed and triager unable to answer question  Answer Assessment - Initial Assessment Questions 1. SYMPTOMS: "Do you have any symptoms?"     Diarrhea 2. SEVERITY: If symptoms are present, ask "Are they mild, moderate or severe?"    Mild  Protocols used: MEDICATION QUESTION CALL-A-AH

## 2018-07-12 NOTE — Telephone Encounter (Signed)
Pt's medication was sent to pt's pharmacy as requested. Confirmation received.  °

## 2018-07-12 NOTE — Telephone Encounter (Signed)
° ° ° °*  STAT* If patient is at the pharmacy, call can be transferred to refill team.   1. Which medications need to be refilled? (please list name of each medication and dose if known) diltiazem (CARTIA XT) 120 MG 24 hr capsule  2. Which pharmacy/location (including street and city if local pharmacy) is medication to be sent to? [place], [place] - [address]  3. Do they need a 30 day or 90 day supply? [place]

## 2018-07-13 NOTE — Telephone Encounter (Signed)
Diarrhea is a very nonspecific symptom. Certainly some medications can cause diarrhea, it is not commonly reported by patients taking statins.  She has been on pravastatin for about a month.  If she thinks  That diarrhea she had today is caused by pravastatin, she can discontinue medication and continue with nonpharmacologic treatment for now.  Adequate hydration with clear fluids , small and frequent sips during the day recommended.  We can recheck cholesterol in 4 to 6 months.  Thanks, BJ

## 2018-07-13 NOTE — Telephone Encounter (Signed)
Message sent to Dr. Jordan for review. 

## 2018-07-14 ENCOUNTER — Encounter: Payer: Self-pay | Admitting: *Deleted

## 2018-07-14 NOTE — Telephone Encounter (Signed)
Patient sent my chart message concerning medication concerns.

## 2018-09-22 ENCOUNTER — Telehealth: Payer: Self-pay | Admitting: Family Medicine

## 2018-09-22 NOTE — Telephone Encounter (Signed)
Patient came in today to look for a fax from [facility] and realized that she gave them the wrong number.   She wants you to be on the look out for the fax and get it back to them ASAP. The form has to be turned in before she can move there.

## 2018-09-27 NOTE — Telephone Encounter (Signed)
Leroy Sea from [facility] will be faxing a Warehouse manager Form. She will be refaxing the form. Please advise. Thank you

## 2018-09-28 NOTE — Telephone Encounter (Signed)
Pt called about the form being filled out/ advised it was place on providers desk. Please call pt when it has been completed

## 2018-09-28 NOTE — Telephone Encounter (Signed)
Form placed on doctor's desk for completion. 

## 2018-10-01 NOTE — Telephone Encounter (Signed)
Form completed and faxed to [facility].

## 2018-11-25 ENCOUNTER — Telehealth: Payer: Self-pay | Admitting: Family Medicine

## 2018-11-25 NOTE — Telephone Encounter (Unsigned)
Copied from [place] [id number]. Topic: Quick Communication - Rx Refill/Question >> Nov 25, 2018 10:40 AM Celene Kras A wrote: Medication: levothyroxine (SYNTHROID, LEVOTHROID) 100 MCG tablet   Has the patient contacted their pharmacy? No. (Agent: If no, request that the patient contact the pharmacy for the refill.) (Agent: If yes, when and what did the pharmacy advise?)  Preferred Pharmacy (with phone number or street name): [place], [place] - [address] [place] [address] Phone: [phone] Fax: [phone] Not a 24 hour pharmacy; exact hours not known.    Agent: Please be advised that RX refills may take up to 3 business days. We ask that you follow-up with your pharmacy.

## 2018-11-25 NOTE — Telephone Encounter (Signed)
Patient will call back to reschedule physical, however she is requesting orders for labs for a cholesterol check.

## 2018-11-26 NOTE — Telephone Encounter (Signed)
Message sent to Dr. Jordan for review and approval. 

## 2018-11-28 ENCOUNTER — Other Ambulatory Visit: Payer: Self-pay | Admitting: Family Medicine

## 2018-11-30 ENCOUNTER — Other Ambulatory Visit: Payer: Self-pay | Admitting: *Deleted

## 2018-11-30 DIAGNOSIS — M545 Low back pain: Secondary | ICD-10-CM | POA: Diagnosis not present

## 2018-11-30 DIAGNOSIS — M4180 Other forms of scoliosis, site unspecified: Secondary | ICD-10-CM | POA: Diagnosis not present

## 2018-11-30 DIAGNOSIS — M4150 Other secondary scoliosis, site unspecified: Secondary | ICD-10-CM | POA: Insufficient documentation

## 2018-11-30 MED ORDER — LEVOTHYROXINE SODIUM 100 MCG PO TABS: 100.0000 ug | ORAL_TABLET | Freq: Every day | ORAL | 3 refills | Status: DC

## 2018-11-30 MED ORDER — LEVOTHYROXINE SODIUM 100 MCG PO TABS: ORAL_TABLET | ORAL | 3 refills | Status: DC

## 2018-11-30 NOTE — Telephone Encounter (Signed)
Spoke with patient and gave recommendations per Dr. Martinique. Patient stated that she does not want a virtual visit at this time and she doesn't understand what the issue is with her coming in to just have labs done. Patient informed that I would give PCP the message.

## 2018-11-30 NOTE — Telephone Encounter (Signed)
Please explained that we are trying not to bring pts to the clinic to decrease risk of exposure. We can hold on labs until it is safer to see her in the clinic. If she insists please arrange f/u virtual appt.  Thanks, BJ

## 2018-12-01 NOTE — Telephone Encounter (Signed)
She needs 6 months follow up,last visit 05/2018. Thanks, BJ

## 2018-12-01 NOTE — Telephone Encounter (Signed)
Rx was already sent to her pharmacy. Joyelle Siedlecki Martinique, MD

## 2018-12-01 NOTE — Telephone Encounter (Signed)
Please schedule pt,it has been 6 months since her last visit,05/2018. Thanks, BJ

## 2018-12-01 NOTE — Telephone Encounter (Signed)
RN spoke with patient. Informed her of PCP message. Patient was very adamant about not having virtual visit until she has labs done first. Please advise

## 2018-12-01 NOTE — Telephone Encounter (Signed)
Patient scheduled for in office visit for 12/03/2018, declined having a virtual visit.

## 2018-12-03 ENCOUNTER — Other Ambulatory Visit: Payer: Self-pay

## 2018-12-03 ENCOUNTER — Ambulatory Visit (INDEPENDENT_AMBULATORY_CARE_PROVIDER_SITE_OTHER): Payer: Medicare HMO | Admitting: Family Medicine

## 2018-12-03 ENCOUNTER — Encounter: Payer: Self-pay | Admitting: Family Medicine

## 2018-12-03 VITALS — BP 120/76 | HR 67 | Temp 97.9°F | Resp 12 | Ht 65.5 in | Wt 131.2 lb

## 2018-12-03 DIAGNOSIS — I351 Nonrheumatic aortic (valve) insufficiency: Secondary | ICD-10-CM | POA: Diagnosis not present

## 2018-12-03 DIAGNOSIS — M545 Low back pain, unspecified: Secondary | ICD-10-CM

## 2018-12-03 DIAGNOSIS — E039 Hypothyroidism, unspecified: Secondary | ICD-10-CM | POA: Diagnosis not present

## 2018-12-03 DIAGNOSIS — I6521 Occlusion and stenosis of right carotid artery: Secondary | ICD-10-CM | POA: Diagnosis not present

## 2018-12-03 DIAGNOSIS — E785 Hyperlipidemia, unspecified: Secondary | ICD-10-CM

## 2018-12-03 NOTE — Assessment & Plan Note (Signed)
Echo done in 2014 reviewed with patient. Educated about diagnosis and prognosis. Asymptomatic. Instructed about warning signs.

## 2018-12-03 NOTE — Progress Notes (Signed)
HPI:   Stacy [name] is a 78 y.o. female, who is here today for 6 months follow up.   She was last seen on 05/31/18. She refused virtual visit.  Since her last OV she has followed with ortho because left lower back pain. She has had pain for a few weeks now. According to pt,epidural injection was recommended,referred to another provider.  Pain is "grabbing" like sensation,4/10, intermittent,and not radiated. Pain started suddenly while she was walking up in between seats to watch a basketball game.  Denies LE numbness or tingling. No saddle anesthesia or changes in urine/bowel habits.  Topical treatment was recommended. Hx of scoliosis.  She has not had pain for a week but still would like to see a "scoliosis specialist",Dr Cohen.  Hypothyroidism: Currently she is on Levothyroxine 100 mcg daily x 5 days and 1.5 tab Mon and Thurs.. Tolerating medication well, no side effects reported. She has not noted dysphagia, palpitations, abdominal pain, changes in bowel habits, tremor, cold/heat intolerance, or abnormal weight loss.  Lab Results  Component Value Date   TSH 1.22 08/10/2017     Hyperlipidemia: Currently on non pharmacologic treatment. "Horroble" side effects with Lovastatin and Pravastatin, GI symptoms.  She is following low fat diet and trying to eat healthier.has lost about 10 Lb. She was exercising regularly until the gym was closed due to COVID-19 pandemia.  Following a low fat diet: Yes.  PAD,she follows with cardiologist. She is also on Aspirin 81 mg daily. Carotid duplex 05/2018 with 40-59% right ICA stenosis.  Denies severe/frequent headache, visual changes, chest pain, dyspnea, palpitation, claudication, focal weakness, or edema.   Lab Results  Component Value Date   CHOL 216 (H) 12/08/2017   HDL 74.90 12/08/2017   LDLCALC 129 (H) 12/08/2017   LDLDIRECT 148.1 08/17/2013   TRIG 63.0 12/08/2017   CHOLHDL 3 12/08/2017   She is also  inquiring about Dx she saw on her problem list, aortic insufficiency.States that she is not aware of having this problem,does not remember discussing this with cardiologist. Hx of atrail fib.   Review of Systems  Constitutional: Negative for activity change, appetite change, fatigue and fever.  HENT: Negative for sore throat and trouble swallowing.   Eyes: Negative for redness and visual disturbance.  Respiratory: Negative for cough, shortness of breath and wheezing.   Cardiovascular: Negative for chest pain, palpitations and leg swelling.  Gastrointestinal: Negative for abdominal pain, nausea and vomiting.       Negative for changes in bowel habits.  Endocrine: Negative for cold intolerance and heat intolerance.  Genitourinary: Negative for decreased urine volume and hematuria.  Musculoskeletal: Positive for back pain.  Neurological: Negative for syncope, weakness and headaches.  Psychiatric/Behavioral: The patient is nervous/anxious.     Current Outpatient Medications on File Prior to Visit  Medication Sig Dispense Refill  . aspirin 81 MG tablet Take 81 mg by mouth daily.    . Cholecalciferol (VITAMIN D-3) 1000 UNITS CAPS Take 1,000 Units by mouth 2 (two) times daily.     [name] Kitchen diltiazem (CARTIA XT) 120 MG 24 hr capsule Take 1 capsule (120 mg total) by mouth daily. 90 capsule 3  . levothyroxine (SYNTHROID) 100 MCG tablet Take 1 tablet by mouth daily and take 1 and 1/2 on Monday and Thursday 90 tablet 3  . LORazepam (ATIVAN) 0.5 MG tablet One half tab daily at bedtime when necessary 10 tablet 0  . Multiple Vitamin (MULTIVITAMIN) capsule Take 1 capsule by mouth daily.    [name] Kitchen  Multiple Vitamins-Minerals (PRESERVISION AREDS PO) Take 1 capsule by mouth 2 (two) times daily. Reported on 08/22/2015     No current facility-administered medications on file prior to visit.      Past Medical History:  Diagnosis Date  . Atrial fibrillation ([place])    echo 12/23/10 EF= >55%, stress myoview 01/30/11  normal pattern of perfusion in all myocardial regions  . Chicken pox   . Colon polyp   . Colon polyp   . Dyslipidemia   . Heart murmur   . Hiatal hernia   . Hypothyroidism   . Laryngopharyngeal reflux   . Osteopenia   . Scoliosis   . Seasonal allergies   . Thyroid disease   . Vitamin D deficiency    Allergies  Allergen Reactions  . Amoxicillin Swelling  . Flonase [Fluticasone Propionate] Swelling    Swelling of throat per patient Swelling of throat per patient  . Sulfa Antibiotics Other (See Comments)    Upset stomach  . Fluticasone Propionate Swelling    Swelling of throat per patient  . Sulfasalazine Other (See Comments)    Upset stomach    Social History   Socioeconomic History  . Marital status: Single    Spouse name: Not on file  . Number of children: Not on file  . Years of education: Not on file  . Highest education level: Not on file  Occupational History  . Occupation: retired  Scientific laboratory technician  . Financial resource strain: Not on file  . Food insecurity:    Worry: Not on file    Inability: Not on file  . Transportation needs:    Medical: Not on file    Non-medical: Not on file  Tobacco Use  . Smoking status: Never Smoker  . Smokeless tobacco: Never Used  Substance and Sexual Activity  . Alcohol use: Yes    Alcohol/week: 1.0 standard drinks    Types: 1 Glasses of wine per week    Comment: 1 glass of wine 3x a week  . Drug use: No  . Sexual activity: Not on file  Lifestyle  . Physical activity:    Days per week: Not on file    Minutes per session: Not on file  . Stress: Not on file  Relationships  . Social connections:    Talks on phone: Not on file    Gets together: Not on file    Attends religious service: Not on file    Active member of club or organization: Not on file    Attends meetings of clubs or organizations: Not on file    Relationship status: Not on file  Other Topics Concern  . Not on file  Social History Narrative   Work or  School: [employer] Situation: lives alone      Spiritual Beliefs: Jewish      Lifestyle[facility] [employer] [facility]; diet healthy             Vitals:   12/03/18 1424  BP: 120/76  Pulse: 67  Resp: 12  Temp: 97.9 F (36.6 C)  SpO2: 97%   Body mass index is 21.51 kg/m.  Physical Exam  Nursing note and vitals reviewed. Constitutional: She is oriented to person, place, and time. She appears well-developed and well-nourished. No distress.  HENT:  Head: Normocephalic and atraumatic.  Mouth/Throat: Oropharynx is clear and moist and mucous membranes are normal.  Eyes: Conjunctivae are normal.  Cardiovascular: Normal  rate and regular rhythm.  Occasional extrasystoles are present.  No murmur heard. DP pulses present.  Respiratory: Effort normal and breath sounds normal. No respiratory distress.  GI: Soft. She exhibits no mass. There is no hepatomegaly. There is no abdominal tenderness.  Musculoskeletal:        General: No edema.     Thoracic back: She exhibits deformity.     Lumbar back: She exhibits no tenderness.  Lymphadenopathy:    She has no cervical adenopathy.  Neurological: She is alert and oriented to person, place, and time. She has normal strength. No cranial nerve deficit.  Otherwise stable gait without assistance.  Skin: Skin is warm. No rash noted. No erythema.  Psychiatric: Her mood appears anxious.  Well groomed, good eye contact.    ASSESSMENT AND PLAN:   [name] was seen today for 6 months follow-up.  Orders Placed This Encounter  Procedures  . Lipid panel  . TSH  . Ambulatory referral to Orthopedic Surgery    Aortic insufficiency Echo done in 2014 reviewed with patient. Educated about diagnosis and prognosis. Asymptomatic. Instructed about warning signs.  Dyslipidemia Continue nonpharmacologic treatment. Further recommendation will be given according to lipid panel results.  Hypothyroidism Continue  levothyroxine 100 mcg daily x 5 days and 1.5 tablet on Monday and Thursday. Medication will be adjusted if needed and according to TSH results.  Carotid artery stenosis, asymptomatic, right Asymptomatic. She did not tolerate statin. Continue Aspirin 81 mg daily. She has an appointment with her cardiologist in 05/2019.  Left-sided low back pain without sciatica, unspecified chronicity Back pain has resolved still she would like a referral to discuss prognosis and treatment options.  - Ambulatory referral to Orthopedic Surgery    Return in about 6 months (around 06/05/2019) for labs Monday.     Holmes Hays G. Martinique, MD  [facility]. [place] office.

## 2018-12-03 NOTE — Assessment & Plan Note (Signed)
Continue nonpharmacologic treatment. Further recommendation will be given according to lipid panel results. 

## 2018-12-03 NOTE — Assessment & Plan Note (Signed)
Continue levothyroxine 100 mcg daily x 5 days and 1.5 tablet on Monday and Thursday. Medication will be adjusted if needed and according to TSH results.

## 2018-12-03 NOTE — Assessment & Plan Note (Signed)
Asymptomatic. She did not tolerate statin. Continue Aspirin 81 mg daily. She has an appointment with her cardiologist in 05/2019.

## 2018-12-03 NOTE — Patient Instructions (Signed)
A few things to remember from today's visit:   Hypothyroidism, unspecified type - Plan: TSH  Dyslipidemia - Plan: Lipid panel  Carotid artery stenosis, asymptomatic, right  Aortic valve insufficiency, etiology of cardiac valve disease unspecified  Left-sided low back pain without sciatica, unspecified chronicity - Plan: Ambulatory referral to Orthopedic Surgery   Please be sure medication list is accurate. If a new problem present, please set up appointment sooner than planned today.

## 2018-12-06 ENCOUNTER — Other Ambulatory Visit: Payer: Self-pay

## 2018-12-06 ENCOUNTER — Other Ambulatory Visit (INDEPENDENT_AMBULATORY_CARE_PROVIDER_SITE_OTHER): Payer: Medicare HMO

## 2018-12-06 DIAGNOSIS — E785 Hyperlipidemia, unspecified: Secondary | ICD-10-CM

## 2018-12-06 DIAGNOSIS — E039 Hypothyroidism, unspecified: Secondary | ICD-10-CM | POA: Diagnosis not present

## 2018-12-06 LAB — LIPID PANEL
Cholesterol: 211 mg/dL — ABNORMAL HIGH (ref 0–200)
HDL: 74.9 mg/dL (ref >39.00)
LDL Cholesterol: 123 mg/dL — ABNORMAL HIGH (ref 0–99)
NonHDL: 136.13
Total CHOL/HDL Ratio: 3
Triglycerides: 66 mg/dL (ref 0.0–149.0)
VLDL: 13.2 mg/dL (ref 0.0–40.0)

## 2018-12-06 LAB — TSH: TSH: 0.51 u[IU]/mL (ref 0.35–4.50)

## 2018-12-08 ENCOUNTER — Encounter: Payer: Self-pay | Admitting: Family Medicine

## 2018-12-10 ENCOUNTER — Other Ambulatory Visit: Payer: Self-pay | Admitting: Family Medicine

## 2018-12-10 ENCOUNTER — Encounter: Payer: Medicare HMO | Admitting: Family Medicine

## 2018-12-10 MED ORDER — LEVOTHYROXINE SODIUM 100 MCG PO TABS: ORAL_TABLET | ORAL | 3 refills | Status: DC

## 2018-12-29 DIAGNOSIS — H52203 Unspecified astigmatism, bilateral: Secondary | ICD-10-CM | POA: Diagnosis not present

## 2018-12-29 DIAGNOSIS — Z961 Presence of intraocular lens: Secondary | ICD-10-CM | POA: Diagnosis not present

## 2018-12-30 DIAGNOSIS — R69 Illness, unspecified: Secondary | ICD-10-CM | POA: Diagnosis not present

## 2019-01-19 DIAGNOSIS — M545 Low back pain: Secondary | ICD-10-CM | POA: Diagnosis not present

## 2019-01-19 DIAGNOSIS — M419 Scoliosis, unspecified: Secondary | ICD-10-CM | POA: Diagnosis not present

## 2019-01-24 DIAGNOSIS — M2560 Stiffness of unspecified joint, not elsewhere classified: Secondary | ICD-10-CM | POA: Diagnosis not present

## 2019-01-24 DIAGNOSIS — M6281 Muscle weakness (generalized): Secondary | ICD-10-CM | POA: Diagnosis not present

## 2019-01-26 ENCOUNTER — Telehealth: Payer: Self-pay | Admitting: Interventional Cardiology

## 2019-01-26 DIAGNOSIS — M2560 Stiffness of unspecified joint, not elsewhere classified: Secondary | ICD-10-CM | POA: Diagnosis not present

## 2019-01-26 DIAGNOSIS — M6281 Muscle weakness (generalized): Secondary | ICD-10-CM | POA: Diagnosis not present

## 2019-01-26 NOTE — Telephone Encounter (Signed)
Patient would like to know if she needs to have her Cartotid exam done before she sees Dr. Irish Lack on 09/18 or if he wants her to wait to have that procedure.   You can call er or send her a MyChart message as a response.

## 2019-01-26 NOTE — Telephone Encounter (Signed)
Called and made patient aware that she will not be due for her Carotid US until October 2020. Patient verbalized understanding and thanked me for the call.

## 2019-01-31 DIAGNOSIS — M6281 Muscle weakness (generalized): Secondary | ICD-10-CM | POA: Diagnosis not present

## 2019-01-31 DIAGNOSIS — M2560 Stiffness of unspecified joint, not elsewhere classified: Secondary | ICD-10-CM | POA: Diagnosis not present

## 2019-02-02 DIAGNOSIS — M6281 Muscle weakness (generalized): Secondary | ICD-10-CM | POA: Diagnosis not present

## 2019-02-02 DIAGNOSIS — M2560 Stiffness of unspecified joint, not elsewhere classified: Secondary | ICD-10-CM | POA: Diagnosis not present

## 2019-02-07 DIAGNOSIS — M2560 Stiffness of unspecified joint, not elsewhere classified: Secondary | ICD-10-CM | POA: Diagnosis not present

## 2019-02-07 DIAGNOSIS — M6281 Muscle weakness (generalized): Secondary | ICD-10-CM | POA: Diagnosis not present

## 2019-02-09 DIAGNOSIS — M6281 Muscle weakness (generalized): Secondary | ICD-10-CM | POA: Diagnosis not present

## 2019-02-09 DIAGNOSIS — M2560 Stiffness of unspecified joint, not elsewhere classified: Secondary | ICD-10-CM | POA: Diagnosis not present

## 2019-02-14 DIAGNOSIS — M2560 Stiffness of unspecified joint, not elsewhere classified: Secondary | ICD-10-CM | POA: Diagnosis not present

## 2019-02-14 DIAGNOSIS — M6281 Muscle weakness (generalized): Secondary | ICD-10-CM | POA: Diagnosis not present

## 2019-02-15 DIAGNOSIS — M79672 Pain in left foot: Secondary | ICD-10-CM | POA: Diagnosis not present

## 2019-02-15 DIAGNOSIS — B351 Tinea unguium: Secondary | ICD-10-CM | POA: Diagnosis not present

## 2019-02-15 DIAGNOSIS — M79671 Pain in right foot: Secondary | ICD-10-CM | POA: Diagnosis not present

## 2019-02-18 DIAGNOSIS — M6281 Muscle weakness (generalized): Secondary | ICD-10-CM | POA: Diagnosis not present

## 2019-02-18 DIAGNOSIS — M2560 Stiffness of unspecified joint, not elsewhere classified: Secondary | ICD-10-CM | POA: Diagnosis not present

## 2019-02-21 DIAGNOSIS — M6281 Muscle weakness (generalized): Secondary | ICD-10-CM | POA: Diagnosis not present

## 2019-02-21 DIAGNOSIS — M2560 Stiffness of unspecified joint, not elsewhere classified: Secondary | ICD-10-CM | POA: Diagnosis not present

## 2019-02-25 ENCOUNTER — Non-Acute Institutional Stay: Payer: Medicare HMO | Admitting: Internal Medicine

## 2019-02-25 ENCOUNTER — Other Ambulatory Visit: Payer: Self-pay

## 2019-02-25 ENCOUNTER — Encounter: Payer: Self-pay | Admitting: Internal Medicine

## 2019-02-25 VITALS — BP 160/86 | HR 73 | Temp 98.2°F | Ht 66.0 in | Wt 126.6 lb

## 2019-02-25 DIAGNOSIS — R634 Abnormal weight loss: Secondary | ICD-10-CM | POA: Diagnosis not present

## 2019-02-25 DIAGNOSIS — I480 Paroxysmal atrial fibrillation: Secondary | ICD-10-CM

## 2019-02-25 DIAGNOSIS — I10 Essential (primary) hypertension: Secondary | ICD-10-CM | POA: Diagnosis not present

## 2019-02-25 DIAGNOSIS — M810 Age-related osteoporosis without current pathological fracture: Secondary | ICD-10-CM

## 2019-02-25 DIAGNOSIS — E039 Hypothyroidism, unspecified: Secondary | ICD-10-CM | POA: Diagnosis not present

## 2019-02-25 DIAGNOSIS — R609 Edema, unspecified: Secondary | ICD-10-CM | POA: Diagnosis not present

## 2019-02-25 NOTE — Progress Notes (Signed)
Location:  [place] of Service:  Clinic (12)  Provider:   Code Status:  Goals of Care:  Advanced Directives 02/25/2019  Does Patient Have a Medical Advance Directive? Yes  Type of Advance Directive [place]  Does patient want to make changes to medical advance directive? No - Patient declined  Copy of [place] in Chart? Yes - validated most recent copy scanned in chart (See row information)  Would patient like information on creating a medical advance directive? -     Chief Complaint  Patient presents with   New Patient (Initial Visit)    Discuss weight loss    HPI: Patient is a 78 y.o. female seen today for medical management of chronic diseases.   Patient has h/o Hypothyroidism, Scoliosis, Hyperlipidemia, PAD with H/o Right ICA stenosis Also has h/o PAF one episode in 2012 has stayed in Sinus rhythm , LE edema  Her main Complain today was 15 lbs weight loss in past few months and almost 20 lbs in past 6 months Patient states that she was on statin but had to stop due to side effects.  To cut back on back she had to modify her diet.  Since then she has lost weight.  She lost more weight recently when she moved from her home to friend's home 2 months ago and make a lot of stresses affecting her house PAF One episode in 2012 Follows with cardiology On Cardizem AnticoagulationNot on any Anticoagulant Lower extremity edema EF has been normal in the past Rest TED hoses History of scoliosis Follows with the scoliosis specialist Right ICA stenosis Follows with cardiology Hyperlipidemia Has failed statins due to side effects  Patient lives by herself.  Does not have a any children.  Her POA is her niece which lives out of state.  Does not use cane or walker  Past Medical History:  Diagnosis Date   Atrial fibrillation ([place])    echo 12/23/10 EF= >55%, stress myoview 01/30/11 normal pattern of perfusion in all  myocardial regions   Chicken pox    Colon polyp    Colon polyp    Dyslipidemia    Heart murmur    Hiatal hernia    Hypothyroidism    Laryngopharyngeal reflux    Osteopenia    Scoliosis    Seasonal allergies    Thyroid disease    Vitamin D deficiency     Past Surgical History:  Procedure Laterality Date   BREAST CYST EXCISION Right 1988   ESOPHAGEAL MANOMETRY N/A 08/29/2015   Procedure: ESOPHAGEAL MANOMETRY (EM);  Surgeon: Manus Gunning, MD;  Location: [facility];  Service: Gastroenterology;  Laterality: N/A;   EYE SURGERY     Cataract eye surgery-right 06/2014, left 04/2014   TONSILLECTOMY     Childhood    Allergies  Allergen Reactions   Amoxicillin Swelling   Flonase [Fluticasone Propionate] Swelling    Swelling of throat per patient Swelling of throat per patient   Sulfa Antibiotics Other (See Comments)    Upset stomach   Fluticasone Propionate Swelling    Swelling of throat per patient   Sulfasalazine Other (See Comments)    Upset stomach    Outpatient Encounter Medications as of 02/25/2019  Medication Sig   aspirin 81 MG tablet Take 81 mg by mouth daily.   Cholecalciferol (VITAMIN D-3) 1000 UNITS CAPS Take 1,000 Units by mouth 2 (two) times daily.    diltiazem (CARTIA XT) 120  MG 24 hr capsule Take 1 capsule (120 mg total) by mouth daily.   levothyroxine (SYNTHROID) 100 MCG tablet Take 1 tablet by mouth daily and take 1 and 1/2 on Monday and Thursday   Multiple Vitamin (MULTIVITAMIN) capsule Take 1 capsule by mouth daily.   Multiple Vitamins-Minerals (PRESERVISION AREDS PO) Take 1 capsule by mouth 2 (two) times daily. Reported on 08/22/2015   terbinafine (LAMISIL) 250 MG tablet Take 250 mg by mouth daily.   [DISCONTINUED] LORazepam (ATIVAN) 0.5 MG tablet One half tab daily at bedtime when necessary   No facility-administered encounter medications on file as of 02/25/2019.     Review of Systems:  Review of Systems  Review  of Systems  Constitutional: Negative for activity change, appetite change, chills, diaphoresis, fatigue and fever.  HENT: Negative for mouth sores, postnasal drip, rhinorrhea, sinus pain and sore throat.   Respiratory: Negative for apnea, cough, chest tightness, shortness of breath and wheezing.   Cardiovascular: Negative for chest pain, palpitations and leg swelling.  Gastrointestinal: Negative for abdominal distention, abdominal pain, constipation, diarrhea, nausea and vomiting.  Genitourinary: Negative for dysuria and frequency.  Musculoskeletal: Negative for arthralgias, joint swelling and myalgias.  Skin: Negative for rash.  Neurological: Negative for dizziness, syncope, weakness, light-headedness and numbness.  Psychiatric/Behavioral: Negative for behavioral problems, confusion and sleep disturbance.    Health Maintenance  Topic Date Due   PNA vac Low Risk Adult (1 of 2 - PCV13) 10/06/2004   DEXA SCAN  09/24/2018   INFLUENZA VACCINE  03/05/2019   TETANUS/TDAP  06/06/2020    Physical Exam: Vitals:   02/25/19 1108  BP: (!) 160/86  Pulse: 73  Temp: 98.2 F (36.8 C)  SpO2: 97%  Weight: 126 lb 9.6 oz (57.4 kg)  Height: 5\' 6"  (1.676 m)   Body mass index is 20.43 kg/m. Physical Exam  Constitutional: Oriented to person, place, and time. Well-developed and well-nourished.  HENT:  Head: Normocephalic.  Mouth/Throat: Oropharynx is clear and moist.  Eyes: Pupils are equal, round, and reactive to light.  Neck: Neck supple.  Cardiovascular: Normal rate and normal heart sounds.  No murmur heard. Pulmonary/Chest: Effort normal and breath sounds normal. No respiratory distress. No wheezes. She has no rales.  Abdominal: Soft. Bowel sounds are normal. No distension. There is no tenderness. There is no rebound.  Musculoskeletal: Mild swelling Bilateral Lymphadenopathy: none Neurological: Alert and oriented to person, place, and time.  Skin: Skin is warm and dry.  Psychiatric:  Normal mood and affect. Behavior is normal. Thought content normal.    Labs reviewed: Basic Metabolic Panel: Recent Labs    12/06/18 0937  TSH 0.51   Liver Function Tests: No results for input(s): AST, ALT, ALKPHOS, BILITOT, PROT, ALBUMIN in the last 8760 hours. No results for input(s): LIPASE, AMYLASE in the last 8760 hours. No results for input(s): AMMONIA in the last 8760 hours. CBC: No results for input(s): WBC, NEUTROABS, HGB, HCT, MCV, PLT in the last 8760 hours. Lipid Panel: Recent Labs    12/06/18 0937  CHOL 211*  HDL 74.90  LDLCALC 123*  TRIG 66.0  CHOLHDL 3   No results found for: HGBA1C  Procedures since last visit: No results found.  Assessment/Plan Hypothyroidism, unspecified type - Plan:  Her TSH is on the lower side going normal limits We discussed and patient wants to reduce the dose to 100 mcg daily We will check a TSH in 8 weeks Weight loss - Plan:  Patient does not have any overt symptoms  concerning for her weight loss She is going to follow her weight closely If she continues to lose weight we have talked about doing further testing Will start with regular labs with her TSH  Essential hypertension - Plan:  Her blood pressure was mildly elevated today check twice Patient says usual blood pressure runs low She is going to get it checked q. weekly by the facility nurse and come for a follow-up in 4 weeks  Osteoporosis,  - Plan:  Positive for osteoporosis by DEXA scan Has refused therapy per Her previous PCP Edema, unspecified type - Plan:  Echo has been normal in the past Wears TED hoses  Paroxysmal atrial fibrillation ([place]) - Plan: Follows with cardiology Has been stable on Cardizem Not on any anticoagulation Scoliosis Follows with a scoliosis specialist  She is also on Lamisil per her podiatrist   Labs/tests ordered:  * No order type specified * Next appt:  Visit date not found

## 2019-02-26 DIAGNOSIS — R634 Abnormal weight loss: Secondary | ICD-10-CM | POA: Insufficient documentation

## 2019-02-26 DIAGNOSIS — I10 Essential (primary) hypertension: Secondary | ICD-10-CM | POA: Insufficient documentation

## 2019-02-28 DIAGNOSIS — M6281 Muscle weakness (generalized): Secondary | ICD-10-CM | POA: Diagnosis not present

## 2019-02-28 DIAGNOSIS — M2560 Stiffness of unspecified joint, not elsewhere classified: Secondary | ICD-10-CM | POA: Diagnosis not present

## 2019-03-04 ENCOUNTER — Telehealth: Payer: Self-pay

## 2019-03-04 NOTE — Telephone Encounter (Signed)
Per [place]:  That sounds like an reasonable plan, thanks  (this was documented in the routing comment)

## 2019-03-04 NOTE — Telephone Encounter (Signed)
Patient called to inform Dr.Gupta that her B/P is 180/82, pulse 70, and SPO2 96%. Patient states the only change in her routine is Lamisil 250 mg started by the podiatrist. Patient states that she and the nurse at the facility have decided to d/c the Lamisil over the weekend and recheck the B/P on Monday to see if this is the culprit.  Please advise if you have any recommendations or advice   Message routed to [place], NP (Dr.Gupta is out of office)

## 2019-03-07 DIAGNOSIS — M6281 Muscle weakness (generalized): Secondary | ICD-10-CM | POA: Diagnosis not present

## 2019-03-07 DIAGNOSIS — M2560 Stiffness of unspecified joint, not elsewhere classified: Secondary | ICD-10-CM | POA: Diagnosis not present

## 2019-03-11 ENCOUNTER — Encounter: Payer: Self-pay | Admitting: Internal Medicine

## 2019-03-11 ENCOUNTER — Non-Acute Institutional Stay: Payer: Medicare HMO | Admitting: Internal Medicine

## 2019-03-11 ENCOUNTER — Other Ambulatory Visit: Payer: Self-pay

## 2019-03-11 VITALS — BP 180/92 | HR 68 | Temp 98.0°F | Wt 130.0 lb

## 2019-03-11 DIAGNOSIS — I10 Essential (primary) hypertension: Secondary | ICD-10-CM | POA: Diagnosis not present

## 2019-03-11 MED ORDER — LOSARTAN POTASSIUM 25 MG PO TABS: 25.0000 mg | ORAL_TABLET | Freq: Every day | ORAL | 0 refills | Status: DC

## 2019-03-11 NOTE — Progress Notes (Signed)
Location: [place] of Service:  Clinic (12)  Provider:   Code Status:  Goals of Care:  Advanced Directives 03/11/2019  Does Patient Have a Medical Advance Directive? Yes  Type of Advance Directive [place]  Does patient want to make changes to medical advance directive? No - Patient declined  Copy of [place] in Chart? Yes - validated most recent copy scanned in chart (See row information)  Would patient like information on creating a medical advance directive? -     Chief Complaint  Patient presents with  . Acute Visit    elevated blood pressure    HPI: Patient is a 78 y.o. female seen today for an acute visit for Hypertension Patient has h/o Hypothyroidism, Scoliosis, Hyperlipidemia, PAD with H/o Right ICA stenosis Also has h/o PAF one episode in 2012 has stayed in Sinus rhythm , LE edema  Patient was seen in the office few weeks ago and her blood pressure was high at that time. I had suggested her to go to the facility nurse and get blood pressure checked every week.  Patient walked into our clinic today because her blood pressure was 180/90 and has been as high as noted by the nurse for past few weeks. She denies any chest pain, shortness of breath, headache or any other issues. She does state that she has been very stressed out for past few months due to her move to friend's home. Past Medical History:  Diagnosis Date  . Atrial fibrillation ([place])    echo 12/23/10 EF= >55%, stress myoview 01/30/11 normal pattern of perfusion in all myocardial regions  . Chicken pox   . Colon polyp   . Colon polyp   . Dyslipidemia   . Heart murmur   . Hiatal hernia   . Hypothyroidism   . Laryngopharyngeal reflux   . Osteopenia   . Scoliosis   . Seasonal allergies   . Thyroid disease   . Vitamin D deficiency     Past Surgical History:  Procedure Laterality Date  . BREAST CYST EXCISION Right 1988  . ESOPHAGEAL MANOMETRY  N/A 08/29/2015   Procedure: ESOPHAGEAL MANOMETRY (EM);  Surgeon: Manus Gunning, MD;  Location: [facility];  Service: Gastroenterology;  Laterality: N/A;  . EYE SURGERY     Cataract eye surgery-right 06/2014, left 04/2014  . TONSILLECTOMY     Childhood    Allergies  Allergen Reactions  . Amoxicillin Swelling  . Flonase [Fluticasone Propionate] Swelling    Swelling of throat per patient Swelling of throat per patient  . Sulfa Antibiotics Other (See Comments)    Upset stomach  . Fluticasone Propionate Swelling    Swelling of throat per patient  . Sulfasalazine Other (See Comments)    Upset stomach    Outpatient Encounter Medications as of 03/11/2019  Medication Sig  . aspirin 81 MG tablet Take 81 mg by mouth daily.  . Cholecalciferol (VITAMIN D-3) 1000 UNITS CAPS Take 1,000 Units by mouth 2 (two) times daily.   [name] Kitchen diltiazem (CARTIA XT) 120 MG 24 hr capsule Take 1 capsule (120 mg total) by mouth daily.  [name] Kitchen levothyroxine (SYNTHROID) 100 MCG tablet Take 1 tablet by mouth daily and take 1 and 1/2 on Monday and Thursday  . losartan (COZAAR) 25 MG tablet Take 1 tablet (25 mg total) by mouth daily.  . Multiple Vitamin (MULTIVITAMIN) capsule Take 1 capsule by mouth daily.  . Multiple Vitamins-Minerals (PRESERVISION AREDS PO) Take  1 capsule by mouth 2 (two) times daily. Reported on 08/22/2015  . terbinafine (LAMISIL) 250 MG tablet Take 250 mg by mouth daily.   No facility-administered encounter medications on file as of 03/11/2019.     Review of Systems:  Review of Systems  Review of Systems  Constitutional: Negative for activity change, appetite change, chills, diaphoresis, fatigue and fever.  HENT: Negative for mouth sores, postnasal drip, rhinorrhea, sinus pain and sore throat.   Respiratory: Negative for apnea, cough, chest tightness, shortness of breath and wheezing.   Cardiovascular: Negative for chest pain, palpitations and leg swelling.  Gastrointestinal: Negative for  abdominal distention, abdominal pain, constipation, diarrhea, nausea and vomiting.  Genitourinary: Negative for dysuria and frequency.  Musculoskeletal: Negative for arthralgias, joint swelling and myalgias.  Skin: Negative for rash.  Neurological: Negative for dizziness, syncope, weakness, light-headedness and numbness.  Psychiatric/Behavioral: Negative for behavioral problems, confusion and sleep disturbance.     Health Maintenance  Topic Date Due  . PNA vac Low Risk Adult (1 of 2 - PCV13) 10/06/2004  . DEXA SCAN  09/24/2018  . INFLUENZA VACCINE  03/05/2019  . TETANUS/TDAP  06/06/2020    Physical Exam: Vitals:   03/11/19 1146  BP: (!) 180/92  Pulse: 68  Temp: 98 F (36.7 C)  SpO2: 94%  Weight: 130 lb (59 kg)   Body mass index is 20.98 kg/m. Physical Exam  Constitutional: Oriented to person, place, and time. Well-developed and well-nourished.  HENT:  Head: Normocephalic.  Mouth/Throat: Oropharynx is clear and moist.  Eyes: Pupils are equal, round, and reactive to light.  Neck: Neck supple.  Cardiovascular: Normal rate and normal heart sounds.  No murmur heard. Pulmonary/Chest: Effort normal and breath sounds normal. No respiratory distress. No wheezes. She has no rales.  Abdominal: Soft. Bowel sounds are normal. No distension. There is no tenderness. There is no rebound.  Musculoskeletal: No edema.  Lymphadenopathy: none Neurological: Alert and oriented to person, place, and time.  Skin: Skin is warm and dry.  Psychiatric: Normal mood and affect. Behavior is normal. Thought content normal.    Labs reviewed: Basic Metabolic Panel: Recent Labs    12/06/18 0937  TSH 0.51   Liver Function Tests: No results for input(s): AST, ALT, ALKPHOS, BILITOT, PROT, ALBUMIN in the last 8760 hours. No results for input(s): LIPASE, AMYLASE in the last 8760 hours. No results for input(s): AMMONIA in the last 8760 hours. CBC: No results for input(s): WBC, NEUTROABS, HGB, HCT,  MCV, PLT in the last 8760 hours. Lipid Panel: Recent Labs    12/06/18 0937  CHOL 211*  HDL 74.90  LDLCALC 123*  TRIG 66.0  CHOLHDL 3   No results found for: HGBA1C  Procedures since last visit: No results found.  Assessment/Plan 1. Essential hypertension I discussed with the patient that her blood pressure has been consistently above 150.  And today was 180/90.  In spite of her having stress of moving into a new place this is still needs treatment. I will start her on Cozaar 25 mg daily She will have her renal function tested in 1 week She will continue to follow her blood pressure with the facility nurse and come and see me in 2 weeks.    Labs/tests ordered:   Next appt:  03/17/2019

## 2019-03-16 DIAGNOSIS — M6281 Muscle weakness (generalized): Secondary | ICD-10-CM | POA: Diagnosis not present

## 2019-03-16 DIAGNOSIS — M2560 Stiffness of unspecified joint, not elsewhere classified: Secondary | ICD-10-CM | POA: Diagnosis not present

## 2019-03-17 ENCOUNTER — Other Ambulatory Visit: Payer: Medicare HMO

## 2019-03-17 DIAGNOSIS — R634 Abnormal weight loss: Secondary | ICD-10-CM | POA: Diagnosis not present

## 2019-03-17 DIAGNOSIS — I10 Essential (primary) hypertension: Secondary | ICD-10-CM

## 2019-03-17 LAB — HEPATIC FUNCTION PANEL
AG Ratio: 1.8 (calc) (ref 1.0–2.5)
ALT: 17 U/L (ref 6–29)
AST: 20 U/L (ref 10–35)
Albumin: 4.4 g/dL (ref 3.6–5.1)
Alkaline phosphatase (APISO): 63 U/L (ref 37–153)
Bilirubin, Direct: 0.1 mg/dL (ref 0.0–0.2)
Globulin: 2.4 g/dL (calc) (ref 1.9–3.7)
Indirect Bilirubin: 0.7 mg/dL (calc) (ref 0.2–1.2)
Total Bilirubin: 0.8 mg/dL (ref 0.2–1.2)
Total Protein: 6.8 g/dL (ref 6.1–8.1)

## 2019-03-17 LAB — CBC WITH DIFFERENTIAL/PLATELET
Absolute Monocytes: 577 cells/uL (ref 200–950)
Basophils Absolute: 31 cells/uL (ref 0–200)
Basophils Relative: 0.6 %
Eosinophils Absolute: 208 cells/uL (ref 15–500)
Eosinophils Relative: 4 %
HCT: 43.8 % (ref 35.0–45.0)
Hemoglobin: 14.7 g/dL (ref 11.7–15.5)
Lymphs Abs: 1695 cells/uL (ref 850–3900)
MCH: 32.5 pg (ref 27.0–33.0)
MCHC: 33.6 g/dL (ref 32.0–36.0)
MCV: 96.7 fL (ref 80.0–100.0)
MPV: 11 fL (ref 7.5–12.5)
Monocytes Relative: 11.1 %
Neutro Abs: 2688 cells/uL (ref 1500–7800)
Neutrophils Relative %: 51.7 %
Platelets: 159 10*3/uL (ref 140–400)
RBC: 4.53 10*6/uL (ref 3.80–5.10)
RDW: 12.3 % (ref 11.0–15.0)
Total Lymphocyte: 32.6 %
WBC: 5.2 10*3/uL (ref 3.8–10.8)

## 2019-03-17 LAB — LIPID PANEL
Cholesterol: 271 mg/dL — ABNORMAL HIGH (ref <200)
HDL: 84 mg/dL (ref >=50)
LDL Cholesterol (Calc): 170 mg/dL (calc) — ABNORMAL HIGH
Non-HDL Cholesterol (Calc): 187 mg/dL (calc) — ABNORMAL HIGH (ref <130)
Total CHOL/HDL Ratio: 3.2 (calc) (ref <5.0)
Triglycerides: 71 mg/dL (ref <150)

## 2019-03-17 LAB — BASIC METABOLIC PANEL
BUN: 14 mg/dL (ref 7–25)
CO2: 29 mmol/L (ref 20–32)
Calcium: 9.8 mg/dL (ref 8.6–10.4)
Chloride: 100 mmol/L (ref 98–110)
Creat: 0.73 mg/dL (ref 0.60–0.93)
Glucose, Bld: 97 mg/dL (ref 65–99)
Potassium: 3.8 mmol/L (ref 3.5–5.3)
Sodium: 137 mmol/L (ref 135–146)

## 2019-03-17 LAB — TSH: TSH: 1.63 mIU/L (ref 0.40–4.50)

## 2019-03-22 ENCOUNTER — Telehealth: Payer: Self-pay | Admitting: Interventional Cardiology

## 2019-03-22 ENCOUNTER — Telehealth: Payer: Self-pay | Admitting: *Deleted

## 2019-03-22 DIAGNOSIS — M419 Scoliosis, unspecified: Secondary | ICD-10-CM | POA: Diagnosis not present

## 2019-03-22 NOTE — Progress Notes (Signed)
Virtual Visit via Telephone Note   This visit type was conducted due to national recommendations for restrictions regarding the COVID-19 Pandemic (e.g. social distancing) in an effort to limit this patient's exposure and mitigate transmission in our community.  Due to her co-morbid illnesses, this patient is at least at moderate risk for complications without adequate follow up.  This format is felt to be most appropriate for this patient at this time.  The patient did not have access to video technology/had technical difficulties with video requiring transitioning to audio format only (telephone).  All issues noted in this document were discussed and addressed.  No physical exam could be performed with this format.  Please refer to the patient's chart for her  consent to telehealth for [facility].   Patient unable to access camera  Date:  03/23/2019   ID:  Stacy [name], DOB 01/05/1941, MRN [id number]  Patient Location: Home Provider Location: Home  PCP:  Virgie Dad, MD  Cardiologist:  Larae Grooms, MD  Electrophysiologist:  None   Evaluation Performed:  Follow-Up Visit  Chief Complaint:  PAF, HTN  History of Present Illness:    Stacy [name] is a 78 y.o. female who had an episode of atrial fibrillation in 5/12. She spontaneously converted within the first 24 hours. She was on xarelto, and coumadin but decided to stop anticoagulation due to the need for testing. Her symptoms were very distinct.   SHe has had varicose veins. She did not have any pain. Mild swelling bilaterally, much improved after changing to Cartia XT instead of diltiazem.  Leg swelling better with compression stockings. She was diagnosed with a hiatal hernia and has occasional GERD. She took a PPI but this was stopped.   She works in the yard for activity in the past.   In May 2020, she moved to [facility] independent living.  She has meals delivered and less contact with people from the  outside.  She switched to Dr. Lyndel Safe who visits [facility].    SHe has felt well from a cardiac standpoint.  Denies : Chest pain. Dizziness. Leg edema. Nitroglycerin use. Orthopnea. Palpitations. Paroxysmal nocturnal dyspnea. Shortness of breath. Syncope.   Her BP has been elevated of late.  In May, her BO was normal.  In July, at her first visit with Dr. Lyndel Safe, BP was high.  SHe has been checking her BP with the resident nurse.  BP stayed high, up to the 102H systolic.   The patient does not have symptoms concerning for COVID-19 infection (fever, chills, cough, or new shortness of breath).    Past Medical History:  Diagnosis Date   Atrial fibrillation ([place])    echo 12/23/10 EF= >55%, stress myoview 01/30/11 normal pattern of perfusion in all myocardial regions   Chicken pox    Colon polyp    Colon polyp    Dyslipidemia    Heart murmur    Hiatal hernia    Hypothyroidism    Laryngopharyngeal reflux    Osteopenia    Scoliosis    Seasonal allergies    Thyroid disease    Vitamin D deficiency    Past Surgical History:  Procedure Laterality Date   BREAST CYST EXCISION Right 1988   ESOPHAGEAL MANOMETRY N/A 08/29/2015   Procedure: ESOPHAGEAL MANOMETRY (EM);  Surgeon: Manus Gunning, MD;  Location: [facility];  Service: Gastroenterology;  Laterality: N/A;   EYE SURGERY     Cataract eye surgery-right 06/2014, left 04/2014   TONSILLECTOMY  Childhood     Current Meds  Medication Sig   aspirin 81 MG tablet Take 81 mg by mouth daily.   Cholecalciferol (VITAMIN D-3) 1000 UNITS CAPS Take 1,000 Units by mouth 2 (two) times daily.    diltiazem (CARTIA XT) 120 MG 24 hr capsule Take 1 capsule (120 mg total) by mouth daily.   levothyroxine (SYNTHROID) 100 MCG tablet Take 100 mcg by mouth daily before breakfast.   losartan (COZAAR) 25 MG tablet Take 1 tablet (25 mg total) by mouth daily.   Multiple Vitamin (MULTIVITAMIN) capsule Take 1 capsule by mouth  daily.   Multiple Vitamins-Minerals (PRESERVISION AREDS PO) Take 1 tablet by mouth daily.   terbinafine (LAMISIL) 250 MG tablet Take 250 mg by mouth daily.     Allergies:   Amoxicillin, Flonase [fluticasone propionate], Sulfa antibiotics, Fluticasone propionate, and Sulfasalazine   Social History   Tobacco Use   Smoking status: Never Smoker   Smokeless tobacco: Never Used  Substance Use Topics   Alcohol use: Yes    Alcohol/week: 1.0 standard drinks    Types: 1 Glasses of wine per week    Comment: 1 glass of wine 3x a week   Drug use: No     Family Hx: The patient's family history includes Anuerysm in her father; CVA in her mother; Hyperlipidemia in her mother; Hypertension in her maternal grandmother, mother, and sister; Stroke in her maternal grandmother. There is no history of Colon cancer, Heart attack, or Breast cancer.  ROS:   Please see the history of present illness.    Wt loss  All other systems reviewed and are negative.   Prior CV studies:   The following studies were reviewed today:  Moderate carotid disease noted in 10/19: Right Carotid: Velocities in the right ICA are consistent with a 40-59%                stenosis, low end of range. Right CCA is dilated with high                velocities without plaque morphology.  Left Carotid: Velocities in the left ICA are consistent with a 1-39% stenosis.               Non-hemodynamically significant plaque noted in the CCA.  Labs/Other Tests and Data Reviewed:    EKG:  An ECG dated 10/19 was personally reviewed today and demonstrated:  NSR, no ST changes  Recent Labs: 03/17/2019: ALT 17; BUN 14; Creat 0.73; Hemoglobin 14.7; Platelets 159; Potassium 3.8; Sodium 137; TSH 1.63   Recent Lipid Panel Lab Results  Component Value Date/Time   CHOL 271 (H) 03/17/2019 07:00 AM   TRIG 71 03/17/2019 07:00 AM   HDL 84 03/17/2019 07:00 AM   CHOLHDL 3.2 03/17/2019 07:00 AM   LDLCALC 170 (H) 03/17/2019 07:00 AM    LDLDIRECT 148.1 08/17/2013 11:43 AM    Wt Readings from Last 3 Encounters:  03/23/19 125 lb (56.7 kg)  03/11/19 130 lb (59 kg)  02/25/19 126 lb 9.6 oz (57.4 kg)     Objective:    Vital Signs:  BP (!) 200/82    Pulse 68    Ht 5\' 6"  (1.676 m)    Wt 125 lb (56.7 kg)    BMI 20.18 kg/m    VITAL SIGNS:  reviewed GEN:  no acute distress RESPIRATORY:  no shortness of breath PSYCH:  normal affect exam limited by phone format  ASSESSMENT & PLAN:    1. PAF:  No sx of palpitations.  No irregular heartbeats noted on exam. 2. HTN: Increase losartan to 50 mg given readings are high.  She has an appt Friday with her PMD.  Would have her  3. Varicose veins: No swelling. 4. AI: mild AI in 2014.  Has not had CHF sx in the past.  5. Leg pain: Has had neuropathy sx in the past.  6. Hyperlipidemia: Has had high LDL and high HDL in the past.  Healthy lifestyle has been encouraged.  She was intolerant of a statin when she tried.  She is trying to eat a healthy diet.  7. Carotid artery disease:  Needs Dopplers in 05/2019.  COVID-19 Education: The signs and symptoms of COVID-19 were discussed with the patient and how to seek care for testing (follow up with PCP or arrange E-visit).  The importance of social distancing was discussed today.  Time:   Today, I have spent 25 minutes with the patient with telehealth technology discussing the above problems.     Medication Adjustments/Labs and Tests Ordered: Current medicines are reviewed at length with the patient today.  Concerns regarding medicines are outlined above.   Tests Ordered: No orders of the defined types were placed in this encounter.   Medication Changes: No orders of the defined types were placed in this encounter.   Follow Up:  Virtual Visit in 4 week(s)  Signed, Larae Grooms, MD  03/23/2019 10:05 AM    [place]

## 2019-03-22 NOTE — Telephone Encounter (Signed)
I called patient to discuss about her Hypertension She is not having any other issues except her BP is consistently running high SBP about 180.Denies headache or Chest Pain.I had added Cozaar to her med but her BP still running high. Her TSH and renal function is Normal.  Discussed about adding Diuretic or Hydralazine to emergency control her BP or go up on Cardizem dose but patient says she does not want any more meds She is going to call her Cardiologist to discuss further treatment.

## 2019-03-22 NOTE — Telephone Encounter (Signed)
New Message   Patient states that she is having some issues with her blood pressure. Recently she has her blood pressure checked and it was 200/90. Last week it was 180/80 and her PCP but her on a 25 mg Losartan and it is having no effect on the blood pressure. Patient also states that she has started a medication for toe fungus called Terbinafine and doesn't know if that is effecting it. PCP said that she could put her on another medication, but would like her to check with Dr. Irish Lack first. Please give patient a call back to assist.

## 2019-03-22 NOTE — Telephone Encounter (Signed)
Called patient who states that her BP has been elevated with SBP in the 180s. She denies having any HAs, chest pain, changes in vision, speech, or strength. She states that her PCP added losartan 25 mg QD to her diltiazem 120 mg QD. She states that she has been on the losartan for 10 days with no help in BP. She states that her BP has been 180/75 and 200/90. She states that the facility that she lives in checks her BP on Tuesdays and Fridays. She states that they check her BP before meds. She states that she takes her diltiazem at night and her losartan at lunch time. Patient's PCP gave recommendations on med changes but patient does not want to make any changes to her medicines without talking to Dr. Irish Lack. Arranged for telephone visit tomorrow with Dr. Irish Lack. Patient will try and get the facility to check her BP and HR in the morning prior to her appt.       Virtual Visit Pre-Appointment Phone Call  TELEPHONE CALL NOTE  Vayda Dungee has been deemed a candidate for a follow-up tele-health visit to limit community exposure during the Covid-19 pandemic. I spoke with the patient via phone to ensure availability of phone/video source, confirm preferred email & phone number, and discuss instructions and expectations.  I reminded Caci Orren to be prepared with any vital sign and/or heart rhythm information that could potentially be obtained via home monitoring, at the time of her visit. I reminded Lauretta Sallas to expect a phone call prior to her visit.  Patient agrees to consent below.  Cleon Gustin, RN 03/22/2019 4:23 PM    FULL LENGTH CONSENT FOR TELE-HEALTH VISIT   I hereby voluntarily request, consent and authorize [facility] and its employed or contracted physicians, physician assistants, nurse practitioners or other licensed health care professionals (the Practitioner), to provide me with telemedicine health care services (the "Services") as deemed necessary by the  treating Practitioner. I acknowledge and consent to receive the Services by the Practitioner via telemedicine. I understand that the telemedicine visit will involve communicating with the Practitioner through live audiovisual communication technology and the disclosure of certain medical information by electronic transmission. I acknowledge that I have been given the opportunity to request an in-person assessment or other available alternative prior to the telemedicine visit and am voluntarily participating in the telemedicine visit.  I understand that I have the right to withhold or withdraw my consent to the use of telemedicine in the course of my care at any time, without affecting my right to future care or treatment, and that the Practitioner or I may terminate the telemedicine visit at any time. I understand that I have the right to inspect all information obtained and/or recorded in the course of the telemedicine visit and may receive copies of available information for a reasonable fee.  I understand that some of the potential risks of receiving the Services via telemedicine include:  [name] Kitchen Delay or interruption in medical evaluation due to technological equipment failure or disruption; . Information transmitted may not be sufficient (e.g. poor resolution of images) to allow for appropriate medical decision making by the Practitioner; and/or  . In rare instances, security protocols could fail, causing a breach of personal health information.  Furthermore, I acknowledge that it is my responsibility to provide information about my medical history, conditions and care that is complete and accurate to the best of my ability. I acknowledge that Practitioner's advice, recommendations, and/or decision may be  based on factors not within their control, such as incomplete or inaccurate data provided by me or distortions of diagnostic images or specimens that may result from electronic transmissions. I understand  that the practice of medicine is not an exact science and that Practitioner makes no warranties or guarantees regarding treatment outcomes. I acknowledge that I will receive a copy of this consent concurrently upon execution via email to the email address I last provided but may also request a printed copy by calling the office of [place].    I understand that my insurance will be billed for this visit.   I have read or had this consent read to me. . I understand the contents of this consent, which adequately explains the benefits and risks of the Services being provided via telemedicine.  . I have been provided ample opportunity to ask questions regarding this consent and the Services and have had my questions answered to my satisfaction. . I give my informed consent for the services to be provided through the use of telemedicine in my medical care  By participating in this telemedicine visit I agree to the above.

## 2019-03-22 NOTE — Telephone Encounter (Signed)
Patient has an appointment on Friday with you at the Clinic but wants to know if she should be doing anything now. Please Advise.    Patient called c/o high blood pressure  1. What are your blood pressure readings? Patient was seen at the [facility] today and it was 200/90  2. When were the above readings checked? Checked this morning at the Dr. Gabriel Carina.   3 Any associated symptoms like headache, dizziness, chest pain, shortness of breath, weakness or numbness of leg/arm, or speech changes? No other Symptoms   4. Did you take your blood pressure medications (if patient on b/p medication, check medication list)?  Take medication at Lunchtime  5. Did you take them at least a hour prior to checking your blood pressure? Take BP medication at Lunchtime.   6. Have you been watching your salt in your diet? Yes  7. Have you eaten any salty foods like breakfast meats, ham, soups, canned foods, or snacks? No   I will forward your response to your provider and call you with instructions, if your symptoms persist or progress seek medical attention at urgent care or the emergency room.

## 2019-03-23 ENCOUNTER — Telehealth (INDEPENDENT_AMBULATORY_CARE_PROVIDER_SITE_OTHER): Payer: Medicare HMO | Admitting: Interventional Cardiology

## 2019-03-23 ENCOUNTER — Encounter: Payer: Self-pay | Admitting: Interventional Cardiology

## 2019-03-23 ENCOUNTER — Other Ambulatory Visit: Payer: Self-pay

## 2019-03-23 VITALS — BP 200/82 | HR 68 | Ht 66.0 in | Wt 125.0 lb

## 2019-03-23 DIAGNOSIS — I480 Paroxysmal atrial fibrillation: Secondary | ICD-10-CM | POA: Diagnosis not present

## 2019-03-23 DIAGNOSIS — I351 Nonrheumatic aortic (valve) insufficiency: Secondary | ICD-10-CM | POA: Diagnosis not present

## 2019-03-23 DIAGNOSIS — I739 Peripheral vascular disease, unspecified: Secondary | ICD-10-CM

## 2019-03-23 DIAGNOSIS — R609 Edema, unspecified: Secondary | ICD-10-CM | POA: Diagnosis not present

## 2019-03-23 DIAGNOSIS — E785 Hyperlipidemia, unspecified: Secondary | ICD-10-CM

## 2019-03-23 DIAGNOSIS — I83893 Varicose veins of bilateral lower extremities with other complications: Secondary | ICD-10-CM

## 2019-03-23 DIAGNOSIS — I779 Disorder of arteries and arterioles, unspecified: Secondary | ICD-10-CM

## 2019-03-23 MED ORDER — LOSARTAN POTASSIUM 50 MG PO TABS: 50.0000 mg | ORAL_TABLET | Freq: Every day | ORAL | 3 refills | Status: DC

## 2019-03-23 NOTE — Patient Instructions (Addendum)
Medication Instructions:  Your physician has recommended you make the following change in your medication:   INCREASE: losartan (cozaar) to 50 mg once a day   Lab work: None Ordered  If you have labs (blood work) drawn today and your tests are completely normal, you will receive your results only by: [name] Kitchen MyChart Message (if you have MyChart) OR . A paper copy in the mail If you have any lab test that is abnormal or we need to change your treatment, we will call you to review the results.  Testing/Procedures: Your physician has requested that you have a carotid duplex. This test is an ultrasound of the carotid arteries in your neck. It looks at blood flow through these arteries that supply the brain with blood. Allow one hour for this exam. There are no restrictions or special instructions.   Follow-Up: . Your appointment with Dr. Irish Lack on 04/22/19 at 2:00 PM will be a TELEPHONE Visit  Any Other Special Instructions Will Be Listed Below (If Applicable).

## 2019-03-23 NOTE — Telephone Encounter (Signed)
Patient called and stated that you had requested patient to call her Cardiologist. She spoke with him today. He Recommended not going on any additional medication. He has Increased her Losartan to 50mg  once daily. Sent in a 90 day Rx for her.  Patient is also going to start taking her blood pressures on a daily basis.   Patient stated that she will be seeing you on Friday.

## 2019-03-25 ENCOUNTER — Non-Acute Institutional Stay: Payer: Medicare HMO | Admitting: Internal Medicine

## 2019-03-25 ENCOUNTER — Other Ambulatory Visit: Payer: Self-pay

## 2019-03-25 ENCOUNTER — Encounter: Payer: Self-pay | Admitting: Internal Medicine

## 2019-03-25 VITALS — BP 180/82 | HR 84 | Temp 96.8°F | Resp 18 | Ht 66.0 in | Wt 125.6 lb

## 2019-03-25 DIAGNOSIS — I10 Essential (primary) hypertension: Secondary | ICD-10-CM

## 2019-03-25 DIAGNOSIS — R609 Edema, unspecified: Secondary | ICD-10-CM

## 2019-03-25 DIAGNOSIS — I480 Paroxysmal atrial fibrillation: Secondary | ICD-10-CM

## 2019-03-25 DIAGNOSIS — R634 Abnormal weight loss: Secondary | ICD-10-CM | POA: Diagnosis not present

## 2019-03-25 DIAGNOSIS — E039 Hypothyroidism, unspecified: Secondary | ICD-10-CM

## 2019-03-25 DIAGNOSIS — M810 Age-related osteoporosis without current pathological fracture: Secondary | ICD-10-CM

## 2019-03-25 NOTE — Progress Notes (Signed)
Location: [place] of Service:  Clinic (12)  Provider:   Code Status:  Goals of Care:  Advanced Directives 03/11/2019  Does Patient Have a Medical Advance Directive? Yes  Type of Advance Directive [place]  Does patient want to make changes to medical advance directive? No - Patient declined  Copy of [place] in Chart? Yes - validated most recent copy scanned in chart (See row information)  Would patient like information on creating a medical advance directive? -     Chief Complaint  Patient presents with  . Medical Management of Chronic Issues    4 mo f/u - wt loss, bp issues    HPI: Patient is a 78 y.o. female seen today for an acute visit for Follow up of her BP  Patient has h/o Hypothyroidism, Scoliosis, Hyperlipidemia, PAD with H/o Right ICA stenosis Also has h/o PAF one episode in 2012 has stayed in Sinus rhythm , LE edema  Hypertension Continues to have High SBP. She consulted her Cardiologist and now is on 50 mg of Cozaar. Also oN Cardizem for her Heart. No symptoms of Headache or Chest Pain Hyperlipidemia Refuses to take statins at this time.Wants to continues diet modification Hypothyroid and recent weight loss She wants to be referred to Endocrinologist as she thinks it is all related.    Past Medical History:  Diagnosis Date  . Atrial fibrillation ([place])    echo 12/23/10 EF= >55%, stress myoview 01/30/11 normal pattern of perfusion in all myocardial regions  . Chicken pox   . Colon polyp   . Colon polyp   . Dyslipidemia   . Heart murmur   . Hiatal hernia   . Hypothyroidism   . Laryngopharyngeal reflux   . Osteopenia   . Scoliosis   . Seasonal allergies   . Thyroid disease   . Vitamin D deficiency     Past Surgical History:  Procedure Laterality Date  . BREAST CYST EXCISION Right 1988  . ESOPHAGEAL MANOMETRY N/A 08/29/2015   Procedure: ESOPHAGEAL MANOMETRY (EM);  Surgeon: Manus Gunning, MD;  Location: [facility];  Service: Gastroenterology;  Laterality: N/A;  . EYE SURGERY     Cataract eye surgery-right 06/2014, left 04/2014  . TONSILLECTOMY     Childhood    Allergies  Allergen Reactions  . Amoxicillin Swelling  . Flonase [Fluticasone Propionate] Swelling    Swelling of throat per patient Swelling of throat per patient  . Sulfa Antibiotics Other (See Comments)    Upset stomach  . Fluticasone Propionate Swelling    Swelling of throat per patient  . Sulfasalazine Other (See Comments)    Upset stomach    Outpatient Encounter Medications as of 03/25/2019  Medication Sig  . aspirin 81 MG tablet Take 81 mg by mouth daily.  . Cholecalciferol (VITAMIN D-3) 1000 UNITS CAPS Take 1,000 Units by mouth 2 (two) times daily.   [name] Kitchen diltiazem (CARTIA XT) 120 MG 24 hr capsule Take 1 capsule (120 mg total) by mouth daily.  [name] Kitchen levothyroxine (SYNTHROID) 100 MCG tablet Take 100 mcg by mouth daily before breakfast.  . losartan (COZAAR) 50 MG tablet Take 1 tablet (50 mg total) by mouth daily.  . Multiple Vitamin (MULTIVITAMIN) capsule Take 1 capsule by mouth daily.  . Multiple Vitamins-Minerals (PRESERVISION AREDS PO) Take 1 tablet by mouth daily.  [name] Kitchen terbinafine (LAMISIL) 250 MG tablet Take 250 mg by mouth daily.   No facility-administered encounter medications on  file as of 03/25/2019.     Review of Systems:  Review of Systems  Health Maintenance  Topic Date Due  . PNA vac Low Risk Adult (1 of 2 - PCV13) 10/06/2004  . DEXA SCAN  09/24/2018  . INFLUENZA VACCINE  03/05/2019  . TETANUS/TDAP  06/06/2020    Physical Exam: Vitals:   03/25/19 1051  BP: (!) 180/82  Pulse: 84  Resp: 18  Temp: (!) 96.8 F (36 C)  SpO2: 97%  Weight: 125 lb 9.6 oz (57 kg)  Height: 5\' 6"  (1.676 m)   Body mass index is 20.27 kg/m. Physical Exam  Constitutional: Oriented to person, place, and time. Well-developed and well-nourished.  HENT:  Head: Normocephalic.  Mouth/Throat:  Oropharynx is clear and moist.  Eyes: Pupils are equal, round, and reactive to light.  Neck: Neck supple.  Cardiovascular: Normal rate and normal heart sounds.  No murmur heard. Pulmonary/Chest: Effort normal and breath sounds normal. No respiratory distress. No wheezes. She has no rales.  Abdominal: Soft. Bowel sounds are normal. No distension. There is no tenderness. There is no rebound.  Musculoskeletal: Mild edema Bilateral Lymphadenopathy: none Neurological: Alert and oriented to person, place, and time. Has Scoliosis Gait is normal Skin: Skin is warm and dry.  Psychiatric: Normal mood and affect. Behavior is normal. Thought content normal.    Labs reviewed: Basic Metabolic Panel: Recent Labs    12/06/18 0937 03/17/19 0700  NA  --  137  K  --  3.8  CL  --  100  CO2  --  29  GLUCOSE  --  97  BUN  --  14  CREATININE  --  0.73  CALCIUM  --  9.8  TSH 0.51 1.63   Liver Function Tests: Recent Labs    03/17/19 0700  AST 20  ALT 17  BILITOT 0.8  PROT 6.8   No results for input(s): LIPASE, AMYLASE in the last 8760 hours. No results for input(s): AMMONIA in the last 8760 hours. CBC: Recent Labs    03/17/19 0700  WBC 5.2  NEUTROABS 2,688  HGB 14.7  HCT 43.8  MCV 96.7  PLT 159   Lipid Panel: Recent Labs    12/06/18 0937 03/17/19 0700  CHOL 211* 271*  HDL 74.90 84  LDLCALC 123* 170*  TRIG 66.0 71  CHOLHDL 3 3.2   No results found for: HGBA1C  Procedures since last visit: No results found.  Assessment/Plan 1. Hypothyroidism, unspecified type Patient very Concerned for her Thyroid Medicine and want to know if she can have another Opinion with Endocrinologist Referral Made  2. Weight loss D/W the patient about further imaging CT scan of her Abdomen and Chest She wants to wait and see Endocrinologist first and d/w them She think it it possibly related to her thyroid  3. Essential hypertension Recently Cozaar was increased  Will continue to follow  her BP at home and with facility Nurse Continue Diet and exercise Will have Follow up of BMP in 4 weeks and then will follow with her Cariology  4. Edema, unspecified type Continues Ted hoses Echo is Normal   5. Osteoporosis, unspecified osteoporosis type, unspecified pathological fracture presence Positive for osteoporosis by DEXA scan Has refused therapy per Her previous PCP  6. Paroxysmal atrial fibrillation ([place]) Follows with cardiology Has been stable on Cardizem Not on any anticoagulation Scoliosis Follows with a scoliosis specialist Hyperlipidemia Has tried Statin Before . Right now just want to do Diet and Exercise Patient wanted  note for [facility]. We discussed about her Risk and How it can effect her exposure in Closed spaces. I have told her that I will try to talk to facility Nurse to see if she can use Gym here. Insomnia Does not want to try anything for this right now She is also on Lamisil per her podiatrist. Her Liver Function is Normal   Labs/tests ordered:  BMP Next appt:  Visit date not found Total time spent in this patient care encounter was  40  minutes; greater than 50% of the visit spent counseling patient and staff, reviewing records , Labs and coordinating care for problems addressed at this encounter.

## 2019-03-31 ENCOUNTER — Telehealth: Payer: Self-pay | Admitting: *Deleted

## 2019-03-31 NOTE — Telephone Encounter (Signed)
1. Patient called and stated that the Losartan increase is causing Insomnia and slight diarrhea and feeling alittle bit "foggy" brain.  Bp was 172/86 on Monday, has not came down significantly. Patient is concerned.   2. Patient also stated that she had asked for a referral to Endocrinologist but she has not heard anything. Wanted to go to [facility].   Please Advise.

## 2019-03-31 NOTE — Telephone Encounter (Signed)
Tell her she should continue taking it. But if symptoms get worse she has appointment with Dr Irish Lack and he can see if he would like to change it. If not then let me know and I can bring her to my clinic again and change the Medicine. Also I did place the referral for her . Can you let Lattie Haw know ?

## 2019-04-01 ENCOUNTER — Telehealth: Payer: Self-pay | Admitting: Interventional Cardiology

## 2019-04-01 NOTE — Telephone Encounter (Signed)
Spoke with patient regarding her appointment , she stated that she will be staying on her losartan but her BP is still going up and down. She also stated that she is having insomnia x 2 days, so she will go to her lab and see Dr.Gupta on 04/08/19.

## 2019-04-01 NOTE — Telephone Encounter (Signed)
Referral was sent over to Dr Margreta Journey Gherge/endochrinologist 03/28/19. It is being reviewed & they will call her to schedule.  Thanks, Lattie Haw

## 2019-04-01 NOTE — Telephone Encounter (Signed)
Patient stated that she does not feel comfortable continuing the Losartan. Scheduled an appointment with you for 9/4. Stated that she has been waiting for an appointment call back for a while now. Patient stated that she has an appointment with Dr. Irish Lack on 9/18  Message forwarded to [facility] for the Referral to Endocrinologist.

## 2019-04-01 NOTE — Telephone Encounter (Signed)
New Message   Pt c/o medication issue:  1. Name of Medication: losartan (COZAAR) 50 MG tablet  2. How are you currently taking this medication (dosage and times per day)? Take 1 tablet (50 mg total) by mouth daily.  3. Are you having a reaction (difficulty breathing--STAT)? No  4. What is your medication issue? Mild diarrhea, Headache, Back Pain, Blood Pressure elevated to 188/98 hr 76 and on Monday it was 172/86 hr 76, oxygen level 95, Temperature 98.9 through ear, loss 3.5 lbs since last week, lost 20 lbs in a year, and now having insomnia.    Patient would like a referral to an endocrinologist. Patient states that she has hypothyroidism and feels the symptoms may be coming from that.

## 2019-04-01 NOTE — Telephone Encounter (Signed)
Attempted to contact patient but there was no answer and VM did not pick up. 

## 2019-04-04 NOTE — Telephone Encounter (Signed)
Follow-up:  Patient called back returning a call from our office on Friday.

## 2019-04-04 NOTE — Telephone Encounter (Signed)
Patient is going to have the nurse at [facility] take her BP. She will not be back to her room until after 12:30

## 2019-04-04 NOTE — Telephone Encounter (Signed)
Attempted to contact patient but there was no answer and VM did not pick up. 

## 2019-04-06 DIAGNOSIS — I10 Essential (primary) hypertension: Secondary | ICD-10-CM | POA: Diagnosis not present

## 2019-04-07 ENCOUNTER — Other Ambulatory Visit: Payer: Self-pay

## 2019-04-07 DIAGNOSIS — I10 Essential (primary) hypertension: Secondary | ICD-10-CM

## 2019-04-07 LAB — COMPLETE METABOLIC PANEL WITH GFR
AG Ratio: 2 (calc) (ref 1.0–2.5)
ALT: 17 U/L (ref 6–29)
AST: 17 U/L (ref 10–35)
Albumin: 4.2 g/dL (ref 3.6–5.1)
Alkaline phosphatase (APISO): 63 U/L (ref 37–153)
BUN: 13 mg/dL (ref 7–25)
CO2: 28 mmol/L (ref 20–32)
Calcium: 9.4 mg/dL (ref 8.6–10.4)
Chloride: 100 mmol/L (ref 98–110)
Creat: 0.73 mg/dL (ref 0.60–0.93)
GFR, Est African American: 91 mL/min/{1.73_m2} (ref >=60)
GFR, Est Non African American: 78 mL/min/{1.73_m2} (ref >=60)
Globulin: 2.1 g/dL (calc) (ref 1.9–3.7)
Glucose, Bld: 83 mg/dL (ref 65–99)
Potassium: 3.8 mmol/L (ref 3.5–5.3)
Sodium: 136 mmol/L (ref 135–146)
Total Bilirubin: 0.7 mg/dL (ref 0.2–1.2)
Total Protein: 6.3 g/dL (ref 6.1–8.1)

## 2019-04-08 ENCOUNTER — Other Ambulatory Visit: Payer: Self-pay

## 2019-04-08 ENCOUNTER — Non-Acute Institutional Stay: Payer: Medicare HMO | Admitting: Internal Medicine

## 2019-04-08 ENCOUNTER — Encounter: Payer: Self-pay | Admitting: Internal Medicine

## 2019-04-08 VITALS — BP 178/82 | HR 73 | Temp 97.7°F | Resp 20 | Ht 66.0 in | Wt 129.4 lb

## 2019-04-08 DIAGNOSIS — M810 Age-related osteoporosis without current pathological fracture: Secondary | ICD-10-CM | POA: Diagnosis not present

## 2019-04-08 DIAGNOSIS — E039 Hypothyroidism, unspecified: Secondary | ICD-10-CM | POA: Diagnosis not present

## 2019-04-08 DIAGNOSIS — R634 Abnormal weight loss: Secondary | ICD-10-CM

## 2019-04-08 DIAGNOSIS — I10 Essential (primary) hypertension: Secondary | ICD-10-CM

## 2019-04-08 NOTE — Progress Notes (Addendum)
Location: [place] of Service:  Clinic (12)  Provider:   Code Status:  Goals of Care:  Advanced Directives 03/11/2019  Does Patient Have a Medical Advance Directive? Yes  Type of Advance Directive [place]  Does patient want to make changes to medical advance directive? No - Patient declined  Copy of [place] in Chart? Yes - validated most recent copy scanned in chart (See row information)  Would patient like information on creating a medical advance directive? -     Chief Complaint  Patient presents with  . Acute Visit    C/o - side effects from new medication,     HPI: Patient is a 78 y.o. female seen today for an acute visit fo Possible side effect of Cozaar and concern about the BP. Patient has h/o Hypothyroidism, Scoliosis, Hyperlipidemia, PAD with H/o Right ICA stenosis Also has h/o PAF one episode in 2012 has stayed in Sinus rhythm , LE edema  Hypertension Continues to have high systolic blood pressure. She is on Cozaar 50 mg and also on Cardizem which was started for her heart Patient had some issues with insomnia but we told her to continue taking Cozaar and she said that now she is back to her normal routine. She still wants to know if there is any chance we will decrease the dose. I discussed with her that she needs better control of her blood pressure and she probably have to stay on this for now.  I also discussed with her the she would need higher dose and Possible diuretic.  She wants to talk to her cardiologist Dr. Irish Lack and see before making any change Hyperlipidemia Has refused to take statin at this time wants to continue diet modification Hypothyroid and recent weight loss Referral was made to endocrinologist she is waiting for them to call her for appointment  Past Medical History:  Diagnosis Date  . Atrial fibrillation ([place])    echo 12/23/10 EF= >55%, stress myoview 01/30/11 normal pattern  of perfusion in all myocardial regions  . Chicken pox   . Colon polyp   . Colon polyp   . Dyslipidemia   . Heart murmur   . Hiatal hernia   . Hypothyroidism   . Laryngopharyngeal reflux   . Osteopenia   . Scoliosis   . Seasonal allergies   . Thyroid disease   . Vitamin D deficiency     Past Surgical History:  Procedure Laterality Date  . BREAST CYST EXCISION Right 1988  . ESOPHAGEAL MANOMETRY N/A 08/29/2015   Procedure: ESOPHAGEAL MANOMETRY (EM);  Surgeon: Manus Gunning, MD;  Location: [facility];  Service: Gastroenterology;  Laterality: N/A;  . EYE SURGERY     Cataract eye surgery-right 06/2014, left 04/2014  . TONSILLECTOMY     Childhood    Allergies  Allergen Reactions  . Amoxicillin Swelling  . Flonase [Fluticasone Propionate] Swelling    Swelling of throat per patient Swelling of throat per patient  . Sulfa Antibiotics Other (See Comments)    Upset stomach  . Fluticasone Propionate Swelling    Swelling of throat per patient  . Sulfasalazine Other (See Comments)    Upset stomach    Outpatient Encounter Medications as of 04/08/2019  Medication Sig  . aspirin 81 MG tablet Take 81 mg by mouth daily.  . Cholecalciferol (VITAMIN D-3) 1000 UNITS CAPS Take 1,000 Units by mouth 2 (two) times daily.   [name] Kitchen diltiazem (CARTIA XT)  120 MG 24 hr capsule Take 1 capsule (120 mg total) by mouth daily.  [name] Kitchen levothyroxine (SYNTHROID) 100 MCG tablet Take 100 mcg by mouth daily before breakfast.  . losartan (COZAAR) 50 MG tablet Take 1 tablet (50 mg total) by mouth daily.  . Multiple Vitamin (MULTIVITAMIN) capsule Take 1 capsule by mouth daily.  . Multiple Vitamins-Minerals (PRESERVISION AREDS PO) Take 1 tablet by mouth daily.  [name] Kitchen terbinafine (LAMISIL) 250 MG tablet Take 250 mg by mouth daily.   No facility-administered encounter medications on file as of 04/08/2019.     Review of Systems:  Review of Systems  Review of Systems  Constitutional: Negative for activity change,  appetite change, chills, diaphoresis, fatigue and fever.  HENT: Negative for mouth sores, postnasal drip, rhinorrhea, sinus pain and sore throat.   Respiratory: Negative for apnea, cough, chest tightness, shortness of breath and wheezing.   Cardiovascular: Negative for chest pain, palpitations and leg swelling.  Gastrointestinal: Negative for abdominal distention, abdominal pain, constipation, diarrhea, nausea and vomiting.  Genitourinary: Negative for dysuria and frequency.  Musculoskeletal: Negative for arthralgias, joint swelling and myalgias.  Skin: Negative for rash.  Neurological: Negative for dizziness, syncope, weakness, light-headedness and numbness.  Psychiatric/Behavioral: Negative for behavioral problems, confusion and sleep disturbance.     Health Maintenance  Topic Date Due  . PNA vac Low Risk Adult (1 of 2 - PCV13) 10/06/2004  . DEXA SCAN  09/24/2018  . INFLUENZA VACCINE  03/05/2019  . TETANUS/TDAP  06/06/2020    Physical Exam: Vitals:   04/08/19 1040  BP: (!) 178/82 Repeat BP is 168/82 Taken Manually  Pulse: 73  Resp: 20  Temp: 97.7 F (36.5 C)  SpO2: 97%  Weight: 129 lb 6.4 oz (58.7 kg)  Height: 5\' 6"  (1.676 m)   Body mass index is 20.89 kg/m. Physical Exam  Constitutional: Oriented to person, place, and time. Well-developed and well-nourished. Has Scoliosis HENT:  Head: Normocephalic.  Mouth/Throat: Oropharynx is clear and moist.  Eyes: Pupils are equal, round, and reactive to light.  Neck: Neck supple.  Cardiovascular: Normal rate and normal heart sounds.  No murmur heard. Pulmonary/Chest: Effort normal . Few rales in her left Lung No respiratory distress. No wheezes. She has no rales.  Abdominal: Soft. Bowel sounds are normal. No distension. There is no tenderness. There is no rebound.  Musculoskeletal:Mild edema Lymphadenopathy: none Neurological: Alert and oriented to person, place, and time.  No Focal Deficits Skin: Skin is warm and dry.   Psychiatric: Normal mood and affect. Behavior is normal. Thought content normal.     Labs reviewed: Basic Metabolic Panel: Recent Labs    12/06/18 0937 03/17/19 0700 04/06/19 0000  NA  --  137 136  K  --  3.8 3.8  CL  --  100 100  CO2  --  29 28  GLUCOSE  --  97 83  BUN  --  14 13  CREATININE  --  0.73 0.73  CALCIUM  --  9.8 9.4  TSH 0.51 1.63  --    Liver Function Tests: Recent Labs    03/17/19 0700 04/06/19 0000  AST 20 17  ALT 17 17  BILITOT 0.8 0.7  PROT 6.8 6.3   No results for input(s): LIPASE, AMYLASE in the last 8760 hours. No results for input(s): AMMONIA in the last 8760 hours. CBC: Recent Labs    03/17/19 0700  WBC 5.2  NEUTROABS 2,688  HGB 14.7  HCT 43.8  MCV 96.7  PLT 159  Lipid Panel: Recent Labs    12/06/18 0937 03/17/19 0700  CHOL 211* 271*  HDL 74.90 84  LDLCALC 123* 170*  TRIG 66.0 71  CHOLHDL 3 3.2   No results found for: HGBA1C  Procedures since last visit: No results found.  Assessment/Plan  Hypertension It is coming down on higher dose of Cozaar Renal Function is stable  Other issues are stable 2. Weight loss though she is gaining weight now D/W the patient about further imaging CT scan of her Abdomen and Chest She wants to wait and see Endocrinologist first and d/w them She think it it possibly related to her thyroid   4. Edema, unspecified type Continues Ted hoses Refuses Diuretics right now Echo is Normal   5. Osteoporosis, unspecified osteoporosis type, unspecified pathological fracture presence Positive for osteoporosis by DEXA scan Has refused therapyperHer previous PCP  6. Paroxysmal atrial fibrillation ([place]) Follows with cardiology Has been stable on Cardizem Not on any anticoagulation Scoliosis Follows with a scoliosis specialist Hyperlipidemia Has tried Statin Before . Right now just want to do Diet and Exercise Patient wants to use [employer]. We discussed about her Risk and How it can  effect her exposure in Closed spaces. Insomnia Does not want to try anything for this right now She is also on Lamisil per her podiatrist. Her Liver Function is Normal   Labs/tests ordered:  * No order type specified * Next appt:  05/06/2019 Total time spent in this patient care encounter was  25_  minutes; greater than 50% of the visit spent counseling patient and staff, reviewing records , Labs and coordinating care for problems addressed at this encounter.

## 2019-04-10 NOTE — Progress Notes (Signed)
Virtual Visit via Telephone Note   This visit type was conducted due to national recommendations for restrictions regarding the COVID-19 Pandemic (e.g. social distancing) in an effort to limit this patient's exposure and mitigate transmission in our community.  Due to her co-morbid illnesses, this patient is at least at moderate risk for complications without adequate follow up.  This format is felt to be most appropriate for this patient at this time.  The patient did not have access to video technology/had technical difficulties with video requiring transitioning to audio format only (telephone).  All issues noted in this document were discussed and addressed.  No physical exam could be performed with this format.  Please refer to the patient's chart for her  consent to telehealth for [facility].   Date:  04/12/2019   ID:  Stacy [name], DOB 07-Aug-1940, MRN [id number]  Patient Location: Home Provider Location: Home  PCP:  Virgie Dad, MD  Cardiologist:  Larae Grooms, MD  Electrophysiologist:  None   Evaluation Performed:  Follow-Up Visit  Chief Complaint:  HTN  History of Present Illness:    Stacy [name] is a 78 y.o. female with  who had an episode of atrial fibrillation in 5/12. She spontaneously converted within the first 24 hours. She was on xarelto, and coumadin but decided to stop anticoagulation due to the need for testing. Her symptoms were very distinct.   SHe has had varicose veins. She did not have any pain. Mild swelling bilaterally, much improved after changing to Cartia XT instead of diltiazem.  Leg swelling better with compression stockings.She was diagnosed with a hiatal hernia and has occasional GERD. She took a PPI but this was stopped.  She works in the yard for activity in the past.   In May 2020, she moved to [facility] independent living.  She has meals delivered and less contact with people from the outside.  She switched to Dr.  Lyndel Safe who visits [facility].   Increased losartan to 50 mg in 03/2019 to help with BP.  She feels that she is urinating more since increasing the medicine.  BP has been better, but still high.  Systolics down to the [id number], from the 200s.   HR has been in the 62-78 range.    Had worsening insomnia as well.  Wants to go back to the [facility].  She does wear a mask regularly.    The patient does not have symptoms concerning for COVID-19 infection (fever, chills, cough, or new shortness of breath).    Past Medical History:  Diagnosis Date  . Atrial fibrillation ([place])    echo 12/23/10 EF= >55%, stress myoview 01/30/11 normal pattern of perfusion in all myocardial regions  . Carotid artery stenosis    Per [facility] new patient packet   . Chicken pox   . Colon polyp   . Colon polyp   . Dyslipidemia   . Heart murmur   . Hiatal hernia   . Hypothyroidism   . Laryngopharyngeal reflux   . Osteopenia   . Scoliosis   . Seasonal allergies   . Thyroid disease   . Vitamin D deficiency    Past Surgical History:  Procedure Laterality Date  . BREAST CYST EXCISION Right 1988  . ESOPHAGEAL MANOMETRY N/A 08/29/2015   Procedure: ESOPHAGEAL MANOMETRY (EM);  Surgeon: Manus Gunning, MD;  Location: [facility];  Service: Gastroenterology;  Laterality: N/A;  . EYE SURGERY     Cataract eye surgery-right 06/2014, left 04/2014  .  TONSILLECTOMY     Childhood     Current Meds  Medication Sig  . aspirin 81 MG tablet Take 81 mg by mouth daily.  . Cholecalciferol (VITAMIN D-3) 1000 UNITS CAPS Take 1,000 Units by mouth 2 (two) times daily.   [name] Kitchen diltiazem (CARTIA XT) 120 MG 24 hr capsule Take 1 capsule (120 mg total) by mouth daily.  [name] Kitchen levothyroxine (SYNTHROID) 100 MCG tablet Take 100 mcg by mouth daily before breakfast.  . LORazepam (ATIVAN) 0.5 MG tablet Take 0.25 mg by mouth at bedtime as needed for sleep.  [name] Kitchen losartan (COZAAR) 50 MG tablet Take 1 tablet (50 mg total) by mouth daily.  . Multiple Vitamin  (MULTIVITAMIN) capsule Take 1 capsule by mouth daily.  . Multiple Vitamins-Minerals (PRESERVISION AREDS PO) Take 1 tablet by mouth daily.  [name] Kitchen terbinafine (LAMISIL) 250 MG tablet Take 250 mg by mouth daily.     Allergies:   Amoxicillin, Flonase [fluticasone propionate], Sulfa antibiotics, Fluticasone propionate, and Sulfasalazine   Social History   Tobacco Use  . Smoking status: Never Smoker  . Smokeless tobacco: Never Used  Substance Use Topics  . Alcohol use: Yes    Alcohol/week: 1.0 standard drinks    Types: 1 Glasses of wine per week    Comment: 1 glass of wine 3x a week  . Drug use: No     Family Hx: The patient's family history includes Anuerysm in her father; CVA in her mother; Hyperlipidemia in her mother; Hypertension in her maternal grandmother, mother, and sister; Scoliosis in her sister; Stroke in her maternal grandmother. There is no history of Colon cancer, Heart attack, or Breast cancer.  ROS:   Please see the history of present illness.     All other systems reviewed and are negative.   Prior CV studies:   The following studies were reviewed today:    Labs/Other Tests and Data Reviewed:    EKG:  No ECG reviewed.  Recent Labs: 03/17/2019: Hemoglobin 14.7; Platelets 159; TSH 1.63 04/06/2019: ALT 17; BUN 13; Creat 0.73; Potassium 3.8; Sodium 136   Recent Lipid Panel Lab Results  Component Value Date/Time   CHOL 271 (H) 03/17/2019 07:00 AM   TRIG 71 03/17/2019 07:00 AM   HDL 84 03/17/2019 07:00 AM   CHOLHDL 3.2 03/17/2019 07:00 AM   LDLCALC 170 (H) 03/17/2019 07:00 AM   LDLDIRECT 148.1 08/17/2013 11:43 AM    Wt Readings from Last 3 Encounters:  04/12/19 125 lb (56.7 kg)  04/08/19 129 lb 6.4 oz (58.7 kg)  03/25/19 125 lb 9.6 oz (57 kg)     Objective:    Vital Signs:  BP (!) 180/82   Pulse 68   Ht 5\' 6"  (1.676 m)   Wt 125 lb (56.7 kg)   BMI 20.18 kg/m    VITAL SIGNS:  reviewed GEN:  no acute distress RESPIRATORY:  normal respiratory effort,  symmetric expansion PSYCH:  normal affect exam limited by phone format  ASSESSMENT & PLAN:    1. PAF: No sx of palpitations at this time.  Aspirin 81 mg daily.  She can have an ECG done at [facility] and sent here.   2. HTN: Increase Diltiazem to 240 mg daily.   If she has better BPs, will call in 240 mg tabs.  OK to increase exercise.  If she goes back to the gym, she needs to wear a mask and socailly distance. 3. 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  4. Mild AI:  No CHF. 5. Hyperlipidemia: LDL 170 in 03/2019 6. Carotid artery disease: Needs carotid DOppler in 05/2019. 7. Insomnia: Suggested melatonin, but she states this does not help her.    COVID-19 Education: The signs and symptoms of COVID-19 were discussed with the patient and how to seek care for testing (follow up with PCP or arrange E-visit).  The importance of social distancing was discussed today.  Time:   Today, I have spent 20 minutes with the patient with telehealth technology discussing the above problems.     Medication Adjustments/Labs and Tests Ordered: Current medicines are reviewed at length with the patient today.  Concerns regarding medicines are outlined above.   Tests Ordered: No orders of the defined types were placed in this encounter.   Medication Changes: No orders of the defined types were placed in this encounter.   Follow Up:  Virtual Visit in 1 month(s)  Signed, Larae Grooms, MD  04/12/2019 2:11 PM    [place]

## 2019-04-12 ENCOUNTER — Telehealth (INDEPENDENT_AMBULATORY_CARE_PROVIDER_SITE_OTHER): Payer: Medicare HMO | Admitting: Interventional Cardiology

## 2019-04-12 ENCOUNTER — Other Ambulatory Visit: Payer: Self-pay

## 2019-04-12 ENCOUNTER — Encounter: Payer: Self-pay | Admitting: Interventional Cardiology

## 2019-04-12 VITALS — BP 180/82 | HR 68 | Ht 66.0 in | Wt 125.0 lb

## 2019-04-12 DIAGNOSIS — I10 Essential (primary) hypertension: Secondary | ICD-10-CM

## 2019-04-12 DIAGNOSIS — I351 Nonrheumatic aortic (valve) insufficiency: Secondary | ICD-10-CM | POA: Diagnosis not present

## 2019-04-12 DIAGNOSIS — I779 Disorder of arteries and arterioles, unspecified: Secondary | ICD-10-CM

## 2019-04-12 DIAGNOSIS — I83893 Varicose veins of bilateral lower extremities with other complications: Secondary | ICD-10-CM | POA: Diagnosis not present

## 2019-04-12 DIAGNOSIS — E785 Hyperlipidemia, unspecified: Secondary | ICD-10-CM | POA: Diagnosis not present

## 2019-04-12 DIAGNOSIS — I739 Peripheral vascular disease, unspecified: Secondary | ICD-10-CM | POA: Diagnosis not present

## 2019-04-12 DIAGNOSIS — G4700 Insomnia, unspecified: Secondary | ICD-10-CM | POA: Diagnosis not present

## 2019-04-12 DIAGNOSIS — I480 Paroxysmal atrial fibrillation: Secondary | ICD-10-CM | POA: Diagnosis not present

## 2019-04-12 MED ORDER — DILTIAZEM HCL ER COATED BEADS 120 MG PO CP24: 240.0000 mg | ORAL_CAPSULE | Freq: Every day | ORAL | 3 refills | Status: DC

## 2019-04-12 NOTE — Patient Instructions (Addendum)
Medication Instructions:  Your physician has recommended you make the following change in your medication:  INCREASE DILTIAZEM TO 240 MG ONCE DAILY  AT BEDTIME  If you need a refill on your cardiac medications before your next appointment, please call your pharmacy.   Lab work: None Ordered  If you have labs (blood work) drawn today and your tests are completely normal, you will receive your results only by: [name] Kitchen MyChart Message (if you have MyChart) OR . A paper copy in the mail If you have any lab test that is abnormal or we need to change your treatment, we will call you to review the results.  Testing/Procedures: None Ordered  Follow-Up: Nov 8th at 2:00  Any Other Special Instructions Will Be Listed Below (If Applicable). Check and Record Blood Pressure Daily Call our office in 3-4 weeks to report Blood Pressure readings

## 2019-04-18 DIAGNOSIS — R69 Illness, unspecified: Secondary | ICD-10-CM | POA: Diagnosis not present

## 2019-04-21 ENCOUNTER — Other Ambulatory Visit: Payer: Self-pay

## 2019-04-21 NOTE — Telephone Encounter (Signed)
Patient had televisit on 9/8

## 2019-04-22 ENCOUNTER — Encounter: Payer: Self-pay | Admitting: Internal Medicine

## 2019-04-22 ENCOUNTER — Telehealth: Payer: Medicare HMO | Admitting: Interventional Cardiology

## 2019-04-27 ENCOUNTER — Telehealth (INDEPENDENT_AMBULATORY_CARE_PROVIDER_SITE_OTHER): Payer: Medicare HMO | Admitting: Interventional Cardiology

## 2019-04-27 ENCOUNTER — Encounter: Payer: Self-pay | Admitting: Interventional Cardiology

## 2019-04-27 ENCOUNTER — Other Ambulatory Visit: Payer: Self-pay

## 2019-04-27 ENCOUNTER — Telehealth: Payer: Self-pay

## 2019-04-27 VITALS — BP 172/86 | HR 70 | Ht 66.0 in | Wt 125.0 lb

## 2019-04-27 DIAGNOSIS — H353132 Nonexudative age-related macular degeneration, bilateral, intermediate dry stage: Secondary | ICD-10-CM | POA: Diagnosis not present

## 2019-04-27 DIAGNOSIS — I10 Essential (primary) hypertension: Secondary | ICD-10-CM | POA: Diagnosis not present

## 2019-04-27 DIAGNOSIS — I739 Peripheral vascular disease, unspecified: Secondary | ICD-10-CM

## 2019-04-27 DIAGNOSIS — I351 Nonrheumatic aortic (valve) insufficiency: Secondary | ICD-10-CM

## 2019-04-27 DIAGNOSIS — H35722 Serous detachment of retinal pigment epithelium, left eye: Secondary | ICD-10-CM | POA: Diagnosis not present

## 2019-04-27 DIAGNOSIS — I83893 Varicose veins of bilateral lower extremities with other complications: Secondary | ICD-10-CM | POA: Diagnosis not present

## 2019-04-27 DIAGNOSIS — E785 Hyperlipidemia, unspecified: Secondary | ICD-10-CM | POA: Diagnosis not present

## 2019-04-27 DIAGNOSIS — I480 Paroxysmal atrial fibrillation: Secondary | ICD-10-CM

## 2019-04-27 DIAGNOSIS — H353111 Nonexudative age-related macular degeneration, right eye, early dry stage: Secondary | ICD-10-CM | POA: Diagnosis not present

## 2019-04-27 DIAGNOSIS — H43812 Vitreous degeneration, left eye: Secondary | ICD-10-CM | POA: Diagnosis not present

## 2019-04-27 DIAGNOSIS — I779 Disorder of arteries and arterioles, unspecified: Secondary | ICD-10-CM

## 2019-04-27 NOTE — Patient Instructions (Addendum)
Medication Instructions:  Your physician recommends that you continue on your current medications as directed. Please refer to the Current Medication list given to you today.  It is okay to try benadryl 12.5 mg to 25 mg at night before bed to help with sleep  If you need a refill on your cardiac medications before your next appointment, please call your pharmacy.   Lab work: None Ordered  If you have labs (blood work) drawn today and your tests are completely normal, you will receive your results only by: [name] Kitchen MyChart Message (if you have MyChart) OR . A paper copy in the mail If you have any lab test that is abnormal or we need to change your treatment, we will call you to review the results.  Testing/Procedures: None ordered  Follow-Up: . Keep you appointment with your endocrinologist and call us to report Blood Pressure readings after that.   Any Other Special Instructions Will Be Listed Below (If Applicable).

## 2019-04-27 NOTE — Telephone Encounter (Signed)
Left message for patient to call back  

## 2019-04-27 NOTE — Telephone Encounter (Signed)
-----   Message from Cleon Gustin, RN sent at 04/12/2019  3:05 PM EDT ----- Regarding: Call for BP readings Call patient in 2-3 weeks for BP readings. If BP okay and tolerating increased dose of dilt, can send in RX for dilt 240 CD QHS

## 2019-04-27 NOTE — Progress Notes (Signed)
Virtual Visit via Telephone Note   This visit type was conducted due to national recommendations for restrictions regarding the COVID-19 Pandemic (e.g. social distancing) in an effort to limit this patient's exposure and mitigate transmission in our community.  Due to her co-morbid illnesses, this patient is at least at moderate risk for complications without adequate follow up.  This format is felt to be most appropriate for this patient at this time.  The patient did not have access to video technology/had technical difficulties with video requiring transitioning to audio format only (telephone).  All issues noted in this document were discussed and addressed.  No physical exam could be performed with this format.  Please refer to the patient's chart for her  consent to telehealth for [facility].   Date:  04/27/2019   ID:  Stacy [name], DOB April 21, 1941, MRN [id number]  Patient Location: Home Provider Location: Office  PCP:  Virgie Dad, MD  Cardiologist:  Larae Grooms, MD  Electrophysiologist:  None   Evaluation Performed:  Follow-Up Visit  Chief Complaint:  BP check  History of Present Illness:    Stacy [name] is a 78 y.o. female who had an episode of atrial fibrillation in 5/12. She spontaneously converted within the first 24 hours. She was on xarelto, and coumadin but decided to stop anticoagulation due to the need for testing. Her symptoms were very distinct.   SHe has had varicose veins. She did not have any pain. Mild swelling bilaterally, much improved after changing to Cartia XT instead of diltiazem.  Leg swelling better with compression stockings.She was diagnosed with a hiatal hernia and has occasional GERD. She took a PPI but this was stopped.  She works in the yard for activity in the past.  In May 2020, she moved to [facility] independent living. She has meals delivered and less contact with people from the outside. She switched to Dr.  Lyndel Safe who visits [facility].   Increased losartan to 50 mg in 03/2019 to help with BP.  She feels that she is urinating more since increasing the medicine.  BP has been better, but still high.  Systolics down to the [id number], from the 200s.   HR has been in the 62-78 range.    Had worsening insomnia as well.  Wants to go back to the [facility].  She does wear a mask regularly.    In early 04/2019, plan was :"Increase Diltiazem to 240 mg daily.   If she has better BPs, will call in 240 mg tabs.  OK to increase exercise.  If she goes back to the gym, she needs to wear a mask and socially distance."  BP initially improved with higher dilt dose, but then went back up to [id number] systolic.   The patient does not have symptoms concerning for COVID-19 infection (fever, chills, cough, or new shortness of breath).    Past Medical History:  Diagnosis Date  . Atrial fibrillation ([place])    echo 12/23/10 EF= >55%, stress myoview 01/30/11 normal pattern of perfusion in all myocardial regions  . Carotid artery stenosis    Per [facility] new patient packet   . Chicken pox   . Colon polyp   . Colon polyp   . Dyslipidemia   . Heart murmur   . Hiatal hernia   . Hypothyroidism   . Laryngopharyngeal reflux   . Osteopenia   . Scoliosis   . Seasonal allergies   . Thyroid disease   . Vitamin D deficiency  Past Surgical History:  Procedure Laterality Date  . BREAST CYST EXCISION Right 1988  . ESOPHAGEAL MANOMETRY N/A 08/29/2015   Procedure: ESOPHAGEAL MANOMETRY (EM);  Surgeon: Manus Gunning, MD;  Location: [facility];  Service: Gastroenterology;  Laterality: N/A;  . EYE SURGERY     Cataract eye surgery-right 06/2014, left 04/2014  . TONSILLECTOMY     Childhood     Current Meds  Medication Sig  . aspirin 81 MG tablet Take 81 mg by mouth daily.  . Cholecalciferol (VITAMIN D-3) 1000 UNITS CAPS Take 1,000 Units by mouth 2 (two) times daily.   [name] Kitchen diltiazem (CARTIA XT) 120 MG 24 hr capsule Take 2 capsules (240  mg total) by mouth at bedtime.  [name] Kitchen levothyroxine (SYNTHROID) 100 MCG tablet Take 100 mcg by mouth daily before breakfast.  . LORazepam (ATIVAN) 0.5 MG tablet Take 0.25 mg by mouth at bedtime as needed for sleep.  [name] Kitchen losartan (COZAAR) 50 MG tablet Take 1 tablet (50 mg total) by mouth daily.  . Multiple Vitamin (MULTIVITAMIN) capsule Take 1 capsule by mouth daily.  . Multiple Vitamins-Minerals (PRESERVISION AREDS PO) Take 1 tablet by mouth daily.  [name] Kitchen terbinafine (LAMISIL) 250 MG tablet Take 250 mg by mouth daily.     Allergies:   Amoxicillin, Flonase [fluticasone propionate], Sulfa antibiotics, Fluticasone propionate, and Sulfasalazine   Social History   Tobacco Use  . Smoking status: Never Smoker  . Smokeless tobacco: Never Used  Substance Use Topics  . Alcohol use: Yes    Alcohol/week: 1.0 standard drinks    Types: 1 Glasses of wine per week    Comment: 1 glass of wine 3x a week  . Drug use: No     Family Hx: The patient's family history includes Anuerysm in her father; CVA in her mother; Hyperlipidemia in her mother; Hypertension in her maternal grandmother, mother, and sister; Scoliosis in her sister; Stroke in her maternal grandmother. There is no history of Colon cancer, Heart attack, or Breast cancer.  ROS:   Please see the history of present illness.    Felt comfortable at the  All other systems reviewed and are negative.   Prior CV studies:   The following studies were reviewed today:    Labs/Other Tests and Data Reviewed:    EKG:  No ECG reviewed.  Recent Labs: 03/17/2019: Hemoglobin 14.7; Platelets 159; TSH 1.63 04/06/2019: ALT 17; BUN 13; Creat 0.73; Potassium 3.8; Sodium 136   Recent Lipid Panel Lab Results  Component Value Date/Time   CHOL 271 (H) 03/17/2019 07:00 AM   TRIG 71 03/17/2019 07:00 AM   HDL 84 03/17/2019 07:00 AM   CHOLHDL 3.2 03/17/2019 07:00 AM   LDLCALC 170 (H) 03/17/2019 07:00 AM   LDLDIRECT 148.1 08/17/2013 11:43 AM    Wt Readings from  Last 3 Encounters:  04/27/19 125 lb (56.7 kg)  04/12/19 125 lb (56.7 kg)  04/08/19 129 lb 6.4 oz (58.7 kg)     Objective:    Vital Signs:  BP (!) 172/86   Pulse 70   Ht 5\' 6"  (1.676 m)   Wt 125 lb (56.7 kg)   BMI 20.18 kg/m    VITAL SIGNS:  reviewed GEN:  no acute distress RESPIRATORY:  normal respiratory effort, symmetric expansion PSYCH:  normal affect exam limited by phone format  ASSESSMENT & PLAN:    1. PAF: Maintaining NSR.  2. HTN: She will see her endocrinologist.  If BP stays elevated, would either increase Dilt to 360 mg daily.  She feels that the losartan is causing her insomnia.  We could switch to irbesartan 300 mg daily if BP stays high.  She will contact us after that appt and we will decide on the plan. 3. Varicose veins: Elevate legs.   4. Mild AI: No CHF 5. Hyperlipidemia: LDL 170 at check. 6. Carotid artery disease: needs Doppler in 05/2019  COVID-19 Education: The signs and symptoms of COVID-19 were discussed with the patient and how to seek care for testing (follow up with PCP or arrange E-visit).  The importance of social distancing was discussed today.  Time:   Today, I have spent 15 minutes with the patient with telehealth technology discussing the above problems.     Medication Adjustments/Labs and Tests Ordered: Current medicines are reviewed at length with the patient today.  Concerns regarding medicines are outlined above.   Tests Ordered: No orders of the defined types were placed in this encounter.   Medication Changes: No orders of the defined types were placed in this encounter.   Follow Up:  Virtual Visit in 4 week(s)  Signed, Larae Grooms, MD  04/27/2019 3:31 PM    [place]

## 2019-04-27 NOTE — Telephone Encounter (Signed)
Spoke with the patient. She states that her BP is still elevated at 158/76, 180/98, 172/86 despite take diltiazem 240 mg QD and losartan 50 mg QD. Patient states that she is completely asymptomatic. HR 70s. Will arrange for telephone visit this afternoon with Dr. Irish Lack.

## 2019-05-02 ENCOUNTER — Other Ambulatory Visit: Payer: Self-pay

## 2019-05-04 ENCOUNTER — Ambulatory Visit (INDEPENDENT_AMBULATORY_CARE_PROVIDER_SITE_OTHER): Payer: Medicare HMO | Admitting: Internal Medicine

## 2019-05-04 ENCOUNTER — Other Ambulatory Visit: Payer: Self-pay

## 2019-05-04 ENCOUNTER — Encounter: Payer: Self-pay | Admitting: Internal Medicine

## 2019-05-04 VITALS — BP 140/80 | HR 90 | Ht 65.5 in | Wt 127.0 lb

## 2019-05-04 DIAGNOSIS — I10 Essential (primary) hypertension: Secondary | ICD-10-CM

## 2019-05-04 DIAGNOSIS — E039 Hypothyroidism, unspecified: Secondary | ICD-10-CM | POA: Diagnosis not present

## 2019-05-04 DIAGNOSIS — R634 Abnormal weight loss: Secondary | ICD-10-CM

## 2019-05-04 LAB — T4, FREE: Free T4: 1.33 ng/dL (ref 0.60–1.60)

## 2019-05-04 LAB — POTASSIUM: Potassium: 4.1 mEq/L (ref 3.5–5.1)

## 2019-05-04 LAB — TSH: TSH: 3.39 u[IU]/mL (ref 0.35–4.50)

## 2019-05-04 NOTE — Patient Instructions (Signed)
Please stop at the lab.  Please continue Levothyroxine 100 mg daily.  Take the thyroid hormone every day, with water, at least 30 minutes before breakfast, separated by at least 4 hours from: - acid reflux medications - calcium - iron - multivitamins  Return to see me if the labs are abnormal.

## 2019-05-04 NOTE — Progress Notes (Addendum)
Patient ID: Stacy [name], female   DOB: 05/21/1941, 78 y.o.   MRN: [id number]    HPI  Stacy [name] is a 78 y.o.-year-old female, referred by her PCP, Dr. Lyndel Safe, for management of hypothyroidism, recently diagnosed HTN, and weight loss.  Pt. has been dx with hypothyroidism since her early 18s >> prev. on Levothyroxine 100 mcg x 8 tab a week >> changed to LT4 79 mg daily in 12/2018.  She takes the thyroid hormone: - fasting - with water - separated by 1h from b'fast  - no calcium, iron, PPIs - + multivitamins at lunchtime  I reviewed pt's thyroid tests: Lab Results  Component Value Date   TSH 1.63 03/17/2019   TSH 0.51 12/06/2018   TSH 1.22 08/10/2017   TSH 0.37 08/25/2016   TSH 1.14 02/20/2016   TSH 0.64 08/22/2015   TSH 0.88 08/18/2014   TSH 1.18 08/17/2013   TSH 0.970 12/18/2010   FREET4 1.36 08/17/2013   Antithyroid antibodies: No results found for: THGAB No components found for: TPOAB   Thyroid ultrasound (09/03/2016): Showed a small heterogeneous thyroid with no discrete nodules.  Pt describes: - + weight gain - no fatigue - + diarrhea, off and on - no cold intolerance - no depression - no dry skin - no hair loss  Pt denies feeling nodules in neck, hoarseness, dysphagia/odynophagia, SOB with lying down.  She has no FH of thyroid disorders No FH of thyroid cancer.  No h/o radiation tx to head or neck. No recent use of iodine supplements.  Weight loss:  She mentions that she lost 16 lbs in the last year - 12 lbs until 12/2018 and 4 lbs after this. Gained 2 lbs recently. Appetite is fine.  He does describe that starting 05/2018, she started to adjust her diet >> cut down fatty foods for CV health. She could not tolerate statins.   At the independent living facility where she lives, she is on a meal plan with a lot of veggies and fruit.  Of note, she has a large diaphragmatic hernia containing stomach, EG junction, transverse colon and pancreas per review  of her CT scan report from 04/07/2015.  However, this did not appear to affect the esophageal transit time per review of her barium swallow from 06/15/2015.  She was exercising before the coronavirus pandemic, however, afterwards, which is walking>> now restarted exercise at the [facility]: Cardio, strength, flexibility - 5 days a week.  No history of diabetes.  No history of chronic steroid use.  HTN   Blood pressure was normal: 120/70 until 12/2018 >> since then: max 180-200/82. Today: 140/70.  She was diagnosed with Afib in 2012.  At that time, she was started on diltiazem 79 mg daily.  After she was diagnosed with hypertension >> started losartan 50, her diltiazem dose was doubled 120 >> 240 mg daily.  Pt. also has a history of osteoporosis, for which she refused treatment.  She also has significant scoliosis.  She has no back pain.  She has seen orthopedics and a scoliosis specialist in the past.  She is a retired Network engineer.  ROS: Constitutional: + See HPI, + poor sleep, + nocturia Eyes: no blurry vision, no xerophthalmia ENT: no sore throat, + see HPI Cardiovascular: no CP/SOB/palpitations/+ leg swelling Respiratory: no cough/SOB Gastrointestinal: no N/V/+ D/occasional C Musculoskeletal: no muscle/joint aches Skin: no rashes, + easy bruising Neurological: no tremors/numbness/tingling/dizziness Psychiatric: no depression/anxiety  Past Medical History:  Diagnosis Date  . Atrial fibrillation ([place])  echo 12/23/10 EF= >55%, stress myoview 01/30/11 normal pattern of perfusion in all myocardial regions  . Carotid artery stenosis    Per [facility] new patient packet   . Chicken pox   . Colon polyp   . Colon polyp   . Dyslipidemia   . Heart murmur   . Hiatal hernia   . Hypothyroidism   . Laryngopharyngeal reflux   . Osteopenia   . Scoliosis   . Seasonal allergies   . Thyroid disease   . Vitamin D deficiency    Past Surgical History:  Procedure Laterality Date  . BREAST CYST  EXCISION Right 1988  . ESOPHAGEAL MANOMETRY N/A 08/29/2015   Procedure: ESOPHAGEAL MANOMETRY (EM);  Surgeon: Manus Gunning, MD;  Location: [facility];  Service: Gastroenterology;  Laterality: N/A;  . EYE SURGERY     Cataract eye surgery-right 06/2014, left 04/2014  . TONSILLECTOMY     Childhood   Social History   Socioeconomic History  . Marital status: Single    Spouse name: Not on file  . Number of children: Not on file  . Years of education: Not on file  . Highest education level: Not on file  Occupational History  . Occupation: retired  Scientific laboratory technician  . Financial resource strain: Not on file  . Food insecurity    Worry: Not on file    Inability: Not on file  . Transportation needs    Medical: Not on file    Non-medical: Not on file  Tobacco Use  . Smoking status: Never Smoker  . Smokeless tobacco: Never Used  Substance and Sexual Activity  . Alcohol use: Yes    Alcohol/week: 1.0 standard drinks    Types: 1 Glasses of wine per week    Comment: 1 glass of wine 3x a week  . Drug use: No  . Sexual activity: Not on file  Lifestyle  . Physical activity    Days per week: Not on file    Minutes per session: Not on file  . Stress: Not on file  Relationships  . Social Herbalist on phone: Not on file    Gets together: Not on file    Attends religious service: Not on file    Active member of club or organization: Not on file    Attends meetings of clubs or organizations: Not on file    Relationship status: Not on file  . Intimate partner violence    Fear of current or ex partner: Not on file    Emotionally abused: Not on file    Physically abused: Not on file    Forced sexual activity: Not on file  Other Topics Concern  . Not on file  Social History Narrative   Work or School: [employer] Situation: lives alone      Spiritual Beliefs: Jewish      Lifestyle[facility]; [facility] healthy      Per [facility] New Patient  Packet:      Diet: Heart healthy, low-fat      Caffeine: 1 cup of tea daily       Married, if yes what year: Single      Do you live in a house, apartment, assisted living, condo, trailer, ect: Apartment, 1 person, 7 stories       Pets: No      Current/Past profession: PHD, Education officer, museum      Exercise:  Yes, Treadmill, weights, and walking          Living Will: Yes   DNR: Yes   POA/HPOA: Yes      Functional Status:   Do you have difficulty bathing or dressing yourself? No   Do you have difficulty preparing food or eating? No   Do you have difficulty managing your medications? No   Do you have difficulty managing your finances? No   Do you have difficulty affording your medications? No         Current Outpatient Medications on File Prior to Visit  Medication Sig Dispense Refill  . aspirin 81 MG tablet Take 81 mg by mouth daily.    . Cholecalciferol (VITAMIN D-3) 1000 UNITS CAPS Take 1,000 Units by mouth 2 (two) times daily.     [name] Kitchen diltiazem (CARTIA XT) 120 MG 24 hr capsule Take 2 capsules (240 mg total) by mouth at bedtime. 90 capsule 3  . levothyroxine (SYNTHROID) 100 MCG tablet Take 100 mcg by mouth daily before breakfast.    . losartan (COZAAR) 50 MG tablet Take 1 tablet (50 mg total) by mouth daily. 90 tablet 3  . Multiple Vitamin (MULTIVITAMIN) capsule Take 1 capsule by mouth daily.    . Multiple Vitamins-Minerals (PRESERVISION AREDS PO) Take 1 tablet by mouth daily.    [name] Kitchen terbinafine (LAMISIL) 250 MG tablet Take 250 mg by mouth daily.     No current facility-administered medications on file prior to visit.    Allergies  Allergen Reactions  . Amoxicillin Swelling  . Flonase [Fluticasone Propionate] Swelling    Swelling of throat per patient Swelling of throat per patient  . Sulfa Antibiotics Other (See Comments)    Upset stomach  . Fluticasone Propionate Swelling    Swelling of throat per patient  . Sulfasalazine Other (See Comments)    Upset stomach   Family  History  Problem Relation Age of Onset  . CVA Mother   . Hyperlipidemia Mother   . Hypertension Mother   . Anuerysm Father        brain  . Stroke Maternal Grandmother   . Hypertension Maternal Grandmother   . Hypertension Sister   . Scoliosis Sister   . Colon cancer Neg Hx   . Heart attack Neg Hx   . Breast cancer Neg Hx     PE: BP 140/80   Pulse 90   Ht 5' 5.5" (1.664 m)   Wt 127 lb (57.6 kg)   SpO2 98%   BMI 20.81 kg/m  Wt Readings from Last 3 Encounters:  05/04/19 127 lb (57.6 kg)  04/27/19 125 lb (56.7 kg)  04/12/19 125 lb (56.7 kg)   Constitutional: normal weight, in NAD Eyes: PERRLA, EOMI, no exophthalmos ENT: moist mucous membranes, no thyromegaly, no cervical lymphadenopathy Cardiovascular: RRR, No MRG Respiratory: CTA B Gastrointestinal: abdomen soft, NT, ND, BS+ Musculoskeletal: + Significant scoliosis, strength intact in all 4 Skin: moist, warm, no rashes Neurological: + Mild tremor with outstretched hands, DTR normal in all 4  ASSESSMENT: 1. Hypothyroidism  2.  Weight loss  PLAN:  1. Patient with long-standing hypothyroidism, on levothyroxine therapy, with good control.  Her dose of levothyroxine was recently decreased slightly to 100 mcg daily with subsequent excellent TFTs in 03/2019 - Reviewed together TSH levels dating back to 2012, which were all normal, including at the last check in 03/2019 - she appears euthyroid, but does complain of weight loss, which I do not feel is related to her hypothyroidism.  -  she does not appear to have a goiter, thyroid nodules, or neck compression symptoms - We discussed about correct intake of levothyroxine, fasting, with water, separated by at least 30 minutes from breakfast, and separated by more than 4 hours from calcium, iron, multivitamins, acid reflux medications (PPIs).  She is taking it correctly. - will check thyroid tests today: TSH, free T4 - If labs today are abnormal, she will need to return in ~6 weeks  for repeat labs - Otherwise, I will have her return to see me as needed  2.  Weight loss -We discussed about possible endocrine causes for weight loss, of which the main culprits are:  Hyperthyroidism  Adrenal insufficiency   Uncontrolled insulin-dependent diabetes -She is clearly not hyperthyroid and reviewing her labs, her sugars have always been normal.  She has no other signs of adrenal insufficiency: No abdominal pain, dizziness, low blood pressure, decreased appetite, headaches.  Also, her weight is stabilized recently especially after starting to go to the gym -We discussed about other possible causes of weight loss to include  Malabsorption -no chronic diarrhea, but she does have occasional episodes, which I do not feel are significant enough to cause weight loss or decreased appetite  Cancer -denies, but an investigation is planned by PCP  Medications -reviewed her medication list and I do not see culprits  Depression -denies  Uncontrolled COPD, heart disease, renal disease, liver disease -denies  3.  HTN -Recent diagnosis -At this visit, we discussed about ruling out endocrine causes for HTN: Pheochromocytoma and hyperaldosteronism, also my suspicion is low -She has significant scoliosis and I am wondering whether the abnormal spine architecture can compress the paraspinal sympathetic nervous ganglia with subsequent high blood pressure. Orders Placed This Encounter  Procedures  . TSH  . T4, free  . Metanephrines, plasma  . Catecholamines, fractionated, plasma  . Aldosterone + renin activity w/ ratio  . Potassium  -I will see her back if the labs return abnormal.  Component     Latest Ref Rng & Units 05/04/2019  Epinephrine     pg/mL 52  Norepinephrine     pg/mL 995  Dopamine     pg/mL 42 (H)  Total Catecholamines     pg/mL 1,089  Metanephrine, Pl     <=57 pg/mL 31  Normetanephrine, Pl     <=148 pg/mL 158 (H)  Total Metanephrines-Plasma     <=205 pg/mL 189   ALDOSTERONE     ng/dL 7  Renin Activity     0.25 - 5.82 ng/mL/h 0.48  ALDO / PRA Ratio     0.9 - 28.9 Ratio 14.6  Potassium     3.5 - 5.1 mEq/L 4.1  TSH     0.35 - 4.50 uIU/mL 3.39  T4,Free(Direct)     0.60 - 1.60 ng/dL 1.33   Thyroid tests are normal.   Normetanephrine's are minimally elevated, which is nonspecific.  Also, slightly high dopamine, which is likely physiologic.  No evidence of pheochromocytoma. Aldosterone and renin activity normal.  No evidence of hyperaldosteronism.  Philemon Kingdom, MD PhD [facility] Endocrinology

## 2019-05-06 ENCOUNTER — Non-Acute Institutional Stay: Payer: Medicare HMO | Admitting: Internal Medicine

## 2019-05-06 ENCOUNTER — Other Ambulatory Visit: Payer: Self-pay

## 2019-05-06 ENCOUNTER — Encounter: Payer: Self-pay | Admitting: Internal Medicine

## 2019-05-06 VITALS — BP 165/80 | HR 75 | Temp 98.6°F | Ht 66.0 in | Wt 127.8 lb

## 2019-05-06 DIAGNOSIS — R609 Edema, unspecified: Secondary | ICD-10-CM

## 2019-05-06 DIAGNOSIS — M810 Age-related osteoporosis without current pathological fracture: Secondary | ICD-10-CM | POA: Diagnosis not present

## 2019-05-06 DIAGNOSIS — I10 Essential (primary) hypertension: Secondary | ICD-10-CM

## 2019-05-06 DIAGNOSIS — I480 Paroxysmal atrial fibrillation: Secondary | ICD-10-CM

## 2019-05-06 DIAGNOSIS — E039 Hypothyroidism, unspecified: Secondary | ICD-10-CM | POA: Diagnosis not present

## 2019-05-06 DIAGNOSIS — R634 Abnormal weight loss: Secondary | ICD-10-CM

## 2019-05-06 NOTE — Progress Notes (Signed)
Location: [place] of Service:  Clinic (12)  Provider:   Code Status:  Goals of Care:  Advanced Directives 05/06/2019  Does Patient Have a Medical Advance Directive? Yes  Type of Advance Directive [place]  Does patient want to make changes to medical advance directive? No - Patient declined  Copy of [place] in Chart? Yes - validated most recent copy scanned in chart (See row information)  Would patient like information on creating a medical advance directive? -     Chief Complaint  Patient presents with  . Medical Management of Chronic Issues    2 month follow up. Patient states she did see the endocrinologist and tests were done. She was also told by her cardiologist to have an ekg done with [place].  . Health Maintenance    She will be getting her flu shot this month. She does not want the pneumonia shot unless Lyndel Safe recommends it.     HPI: Patient is a 78 y.o. female seen today for an acute visit for Follow up of her BP.  Patient has h/o Hypothyroidism, Scoliosis, Hyperlipidemia, PAD with H/o Right ICA stenosis Also has h/o PAF one episode in 2012 has stayed in Sinus rhythm , LE edema  Hypertension Her does of Cardizem increased to 240 mg by Dr Irish Lack Also on Cozaar Continues to have few episodes of High Blood Pressure. But Patient does not want anything change. She follows Closely with Cardiology . She also has started Exercising. She feels that will help her BP Continues to have LE edema Hypothyroid Her repeat TSH and T4 has been in Normal Limits Her Other test from Endocrinologist are not back Has gained some weight back. Does not want any more Work up yet  Hyperlipidemia Has refused to take statin at this time wants to continue diet modification  Past Medical History:  Diagnosis Date  . Atrial fibrillation ([place])    echo 12/23/10 EF= >55%, stress myoview 01/30/11 normal pattern of perfusion in all  myocardial regions  . Carotid artery stenosis    Per [facility] new patient packet   . Chicken pox   . Colon polyp   . Colon polyp   . Dyslipidemia   . Heart murmur   . Hiatal hernia   . Hypothyroidism   . Laryngopharyngeal reflux   . Osteopenia   . Scoliosis   . Seasonal allergies   . Thyroid disease   . Vitamin D deficiency     Past Surgical History:  Procedure Laterality Date  . BREAST CYST EXCISION Right 1988  . ESOPHAGEAL MANOMETRY N/A 08/29/2015   Procedure: ESOPHAGEAL MANOMETRY (EM);  Surgeon: Manus Gunning, MD;  Location: [facility];  Service: Gastroenterology;  Laterality: N/A;  . EYE SURGERY     Cataract eye surgery-right 06/2014, left 04/2014  . TONSILLECTOMY     Childhood    Allergies  Allergen Reactions  . Amoxicillin Swelling  . Flonase [Fluticasone Propionate] Swelling    Swelling of throat per patient Swelling of throat per patient  . Sulfa Antibiotics Other (See Comments)    Upset stomach  . Fluticasone Propionate Swelling    Swelling of throat per patient  . Sulfasalazine Other (See Comments)    Upset stomach    Outpatient Encounter Medications as of 05/06/2019  Medication Sig  . aspirin 81 MG tablet Take 81 mg by mouth daily.  . Cholecalciferol (VITAMIN D-3) 1000 UNITS CAPS Take 1,000 Units by mouth  2 (two) times daily.   [name] Kitchen diltiazem (CARTIA XT) 120 MG 24 hr capsule Take 2 capsules (240 mg total) by mouth at bedtime.  [name] Kitchen levothyroxine (SYNTHROID) 100 MCG tablet Take 100 mcg by mouth daily before breakfast.  . losartan (COZAAR) 50 MG tablet Take 1 tablet (50 mg total) by mouth daily.  . Multiple Vitamin (MULTIVITAMIN) capsule Take 1 capsule by mouth daily.  . Multiple Vitamins-Minerals (PRESERVISION AREDS PO) Take 1 tablet by mouth daily.  [name] Kitchen terbinafine (LAMISIL) 250 MG tablet Take 250 mg by mouth daily.   No facility-administered encounter medications on file as of 05/06/2019.     Review of Systems:  Review of Systems  Review of Systems   Constitutional: Negative for activity change, appetite change, chills, diaphoresis, fatigue and fever.  HENT: Negative for mouth sores, postnasal drip, rhinorrhea, sinus pain and sore throat.   Respiratory: Negative for apnea, cough, chest tightness, shortness of breath and wheezing.   Cardiovascular: Negative for chest pain, palpitations  Gastrointestinal: Negative for abdominal distention, abdominal pain, constipation, diarrhea, nausea and vomiting.  Genitourinary: Negative for dysuria and frequency.  Musculoskeletal: Negative for arthralgias, joint swelling and myalgias.  Skin: Negative for rash.  Neurological: Negative for dizziness, syncope, weakness, light-headedness and numbness.  Psychiatric/Behavioral: Negative for behavioral problems, confusion and sleep disturbance.     Health Maintenance  Topic Date Due  . PNA vac Low Risk Adult (1 of 2 - PCV13) 10/06/2004  . DEXA SCAN  09/24/2018  . INFLUENZA VACCINE  03/05/2019  . TETANUS/TDAP  06/06/2020    Physical Exam: Vitals:   05/06/19 0934  BP: (!) 165/80  Pulse: 75  Temp: 98.6 F (37 C)  SpO2: 97%  Weight: 127 lb 12.8 oz (58 kg)  Height: 5\' 6"  (1.676 m)   Body mass index is 20.63 kg/m. Physical Exam  Constitutional: Oriented to person, place, and time. Well-developed and well-nourished.  Has Scoliosis HENT:  Head: Normocephalic.  Mouth/Throat: Oropharynx is clear and moist.  Eyes: Pupils are equal, round, and reactive to light.  Neck: Neck supple.  Cardiovascular: Normal rate and normal heart sounds.  No murmur heard. Pulmonary/Chest: Effort normal and breath sounds normal. No respiratory distress. No wheezes. She has no rales.  Abdominal: Soft. Bowel sounds are normal. No distension. There is no tenderness. There is no rebound.  Musculoskeletal: Mild Edema Bilateral  Lymphadenopathy: none Neurological: Alert and oriented to person, place, and time. No Focal Deficits Skin: Skin is warm and dry.  Psychiatric:  Normal mood and affect. Behavior is normal. Thought content normal.    Labs reviewed: Basic Metabolic Panel: Recent Labs    12/06/18 0937 03/17/19 0700 04/06/19 0000 05/04/19 1152  NA  --  137 136  --   K  --  3.8 3.8 4.1  CL  --  100 100  --   CO2  --  29 28  --   GLUCOSE  --  97 83  --   BUN  --  14 13  --   CREATININE  --  0.73 0.73  --   CALCIUM  --  9.8 9.4  --   TSH 0.51 1.63  --  3.39   Liver Function Tests: Recent Labs    03/17/19 0700 04/06/19 0000  AST 20 17  ALT 17 17  BILITOT 0.8 0.7  PROT 6.8 6.3   No results for input(s): LIPASE, AMYLASE in the last 8760 hours. No results for input(s): AMMONIA in the last 8760 hours. CBC: Recent Labs  03/17/19 0700  WBC 5.2  NEUTROABS 2,688  HGB 14.7  HCT 43.8  MCV 96.7  PLT 159   Lipid Panel: Recent Labs    12/06/18 0937 03/17/19 0700  CHOL 211* 271*  HDL 74.90 84  LDLCALC 123* 170*  TRIG 66.0 71  CHOLHDL 3 3.2   No results found for: HGBA1C  Procedures since last visit: No results found.  Assessment/Plan Essential hypertension BP still runs higher but she has few reading with 140/90 She wants to continue Same doses of Cardizem and Cozaar Her EKG done in Clinic Showed First Degree AV block with Bradycardia This is New Finding Most likely due to Cardizem which was increased recently Patient asymptomatic Will d/w Dr Irish Lack She probably will need lower doses of Cardizem  Hypothyroidism, unspecified type Seen Endocrinologist TSH and Free T4 are normal Will continue on same doses for now Her Other Labs are pending  Weight loss Labs ordered by Endocrinologist are pending  Other issues are stable Osteoporosis, Positive for osteoporosis by DEXA scan Has refused therapyperHer previous PCP Edema, unspecified type Continues Ted hoses Refuses Diuretics right now Echo is Normal Paroxysmal atrial fibrillation ([place]) EKG today showed sinu rhythm  Scoliosis Follows with a scoliosis  specialist Hyperlipidemia Has tried Statin Before . Right now just want to do Diet and Exercise  Labs/tests ordered:  * No order type specified * Next appt:  Follow up in 3 months Total time spent in this patient care encounter was  40_  minutes; greater than 50% of the visit spent counseling patient and staff, reviewing records , Labs and coordinating care for problems addressed at this encounter.

## 2019-05-09 ENCOUNTER — Telehealth: Payer: Self-pay | Admitting: Interventional Cardiology

## 2019-05-09 NOTE — Telephone Encounter (Signed)
Pt c/o BP issue: STAT if pt c/o blurred vision, one-sided weakness or slurred speech  1. What are your last 5 BP readings?  10-05: 200/100 10-04: 154/94 HR 70  10-03: no reading 10-02: 165/80    2. Are you having any other symptoms (ex. Dizziness, headache, blurred vision, passed out)? no  3. What is your BP issue? Patient is worried about the drastic spike in her BP. Her pulse is fine. [facility] has a record of all of her other BP readings. She has also had issues of constipation, but she is not sure how that relates to the medication she is on.  She sent Dr. Irish Lack a MyChart message as well. She knows that it can take a few days, so she thought to call us too. Her PCP, Dr. Lyndel Safe may have already reached out to DR. [place], or she will soon.

## 2019-05-09 NOTE — Telephone Encounter (Signed)
I spoke to the patient who is feeling fine, but wanted Dr Irish Lack to be aware of her recent BP readings.  She will await a call from Dr Hassell Done nurse to further discuss on 10/6.

## 2019-05-10 MED ORDER — DILTIAZEM HCL ER COATED BEADS 120 MG PO CP24: 120.0000 mg | ORAL_CAPSULE | Freq: Every day | ORAL | 3 refills | Status: DC

## 2019-05-10 NOTE — Telephone Encounter (Signed)
-----   Message from Jettie Booze, MD sent at 05/10/2019  7:39 AM EDT ----- Regarding: FW: Bradycardia and AV Block on EKG Anjali,  Thanks.  As long as she is asymptomatic, I would continue the current dose.  Hopefully, her BP is better.  We will arrange a virtual visit for her.  Thanks.    [place], you can add her on to a quarter day, or in some other open slot for a virtual visit.  JV ----- Message ----- From: Virgie Dad, MD Sent: 05/06/2019  10:37 AM EDT To: Jettie Booze, MD Subject: Bradycardia and AV Block on EKG                Hello Dr Irish Lack, You have been helping me with one of our [facility] Patient with her Hypertension. You just went up on  her Cardizem to 240 mg. But her EKG in the Clinic showed [facility] cardia with HR of 56 with first degree AV block. Patient is asymptomatic. But I think she would not tolerate Higher dose of Cardizem. You think your office can see her sooner for Follow up. Patient is really concerned. Thanks you, Veleta Miners MD

## 2019-05-10 NOTE — Telephone Encounter (Signed)
Called and spoke to patient and made her aware that Dr. Irish Lack has been in contact with Dr. Lyndel Safe and has reviewed her BP/HR readings as well as her [place]. Made her aware that he would like for her to decrease her diltiazem to 120 mg QD and f/u virtually. Patient states that she does not want to do a virtual this time she would like to come in. Appt made for 10/9 at 2:40 PM. Patient states that she will decrease her diltiazem to 120 and continue to monitor her BP and HR.

## 2019-05-10 NOTE — Telephone Encounter (Signed)
JV response to MyChart message:  Stacy Booze, MD to Me  05/10/19 8:42 AM Sounds like we can go back to Dilt 120mg  daily.   JV

## 2019-05-10 NOTE — Telephone Encounter (Signed)
See telephone encounter form 05/09/19

## 2019-05-12 LAB — CATECHOLAMINES, FRACTIONATED, PLASMA
Dopamine: 42 pg/mL — ABNORMAL HIGH
Epinephrine: 52 pg/mL
Norepinephrine: 995 pg/mL
Total Catecholamines: 1089 pg/mL

## 2019-05-12 LAB — ALDOSTERONE + RENIN ACTIVITY W/ RATIO
ALDO / PRA Ratio: 14.6 Ratio (ref 0.9–28.9)
Aldosterone: 7 ng/dL
Renin Activity: 0.48 ng/mL/h (ref 0.25–5.82)

## 2019-05-12 LAB — METANEPHRINES, PLASMA
Metanephrine, Free: 31 pg/mL (ref <=57)
Normetanephrine, Free: 158 pg/mL — ABNORMAL HIGH (ref <=148)
Total Metanephrines-Plasma: 189 pg/mL (ref <=205)

## 2019-05-13 ENCOUNTER — Ambulatory Visit: Payer: Medicare HMO | Admitting: Interventional Cardiology

## 2019-05-13 ENCOUNTER — Other Ambulatory Visit: Payer: Self-pay

## 2019-05-13 ENCOUNTER — Encounter: Payer: Self-pay | Admitting: Interventional Cardiology

## 2019-05-13 VITALS — BP 160/80 | HR 72 | Ht 66.0 in | Wt 127.8 lb

## 2019-05-13 DIAGNOSIS — E785 Hyperlipidemia, unspecified: Secondary | ICD-10-CM | POA: Diagnosis not present

## 2019-05-13 DIAGNOSIS — I351 Nonrheumatic aortic (valve) insufficiency: Secondary | ICD-10-CM

## 2019-05-13 DIAGNOSIS — I480 Paroxysmal atrial fibrillation: Secondary | ICD-10-CM | POA: Diagnosis not present

## 2019-05-13 DIAGNOSIS — I779 Disorder of arteries and arterioles, unspecified: Secondary | ICD-10-CM

## 2019-05-13 DIAGNOSIS — I10 Essential (primary) hypertension: Secondary | ICD-10-CM

## 2019-05-13 DIAGNOSIS — I83893 Varicose veins of bilateral lower extremities with other complications: Secondary | ICD-10-CM

## 2019-05-13 MED ORDER — DILTIAZEM HCL ER COATED BEADS 120 MG PO CP24: 120.0000 mg | ORAL_CAPSULE | Freq: Every day | ORAL | 3 refills | Status: DC

## 2019-05-13 MED ORDER — LOSARTAN POTASSIUM 100 MG PO TABS: 100.0000 mg | ORAL_TABLET | Freq: Every day | ORAL | 3 refills | Status: DC

## 2019-05-13 NOTE — Progress Notes (Signed)
Cardiology Office Note   Date:  05/13/2019   ID:  Stacy [name], DOB [birth year], MRN [id number]  PCP:  Stacy Dad, MD    No chief complaint on file.  HTN/PAF  Wt Readings from Last 3 Encounters:  05/13/19 127 lb 12.8 oz (58 kg)  05/06/19 127 lb 12.8 oz (58 kg)  05/04/19 127 lb (57.6 kg)       History of Present Illness: Stacy [name] is a 78 y.o. female  who had an episode of atrial fibrillation in 5/12. She spontaneously converted within the first 24 hours. She was on xarelto, and coumadin but decided to stop anticoagulation due to the need for testing. Her symptoms were very distinct.   SHe has had varicose veins. She did not have any pain. Mild swelling bilaterally, much improved after changing to Cartia XT instead of diltiazem.  Leg swelling better with compression stockings.She was diagnosed with a hiatal hernia and has occasional GERD. She took a PPI but this was stopped.  She works in the yard for activity in the past.  In May 2020, she moved to [facility] independent living. She has meals delivered and less contact with people from the outside. She switched to Dr. Lyndel Safe who visits [facility].  Increased losartan to 50 mg in 03/2019 to help with BP. She feels that she is urinating more since increasing the medicine. BP has been better, but still high. Systolics down to the [id number], from the 200s. HR has been in the 62-78 range.   Had worsening insomnia as well. Wants to go back to the [facility]. She does wear a mask regularly.   In early 04/2019, plan was :"Increase Diltiazem to 240 mg daily. If she has better BPs, will call in 240 mg tabs. OK to increase exercise. If she goes back to the gym, she needs to wear a mask and socially distance."  BP initially improved with higher dilt dose, but then went back up to [id number] systolic.   HR slowed down on the higher dose of dilt so this was decreased back to 120 mg daily.  Constipation has  improved.    BP better since resuming exercise.  Still has some spikes to the 180 range.  Denies : Chest pain. Dizziness. Leg edema. Nitroglycerin use. Orthopnea. Palpitations. Paroxysmal nocturnal dyspnea. Shortness of breath. Syncope.     Past Medical History:  Diagnosis Date  . Atrial fibrillation ([place])    echo 12/23/10 EF= >55%, stress myoview 01/30/11 normal pattern of perfusion in all myocardial regions  . Carotid artery stenosis    Per [facility] new patient packet   . Chicken pox   . Colon polyp   . Colon polyp   . Dyslipidemia   . Heart murmur   . Hiatal hernia   . Hypothyroidism   . Laryngopharyngeal reflux   . Osteopenia   . Scoliosis   . Seasonal allergies   . Thyroid disease   . Vitamin D deficiency     Past Surgical History:  Procedure Laterality Date  . BREAST CYST EXCISION Right 1988  . ESOPHAGEAL MANOMETRY N/A 08/29/2015   Procedure: ESOPHAGEAL MANOMETRY (EM);  Surgeon: Manus Gunning, MD;  Location: [facility];  Service: Gastroenterology;  Laterality: N/A;  . EYE SURGERY     Cataract eye surgery-right 06/2014, left 04/2014  . TONSILLECTOMY     Childhood     Current Outpatient Medications  Medication Sig Dispense Refill  . aspirin 81 MG tablet Take 81 mg  by mouth daily.    . Cholecalciferol (VITAMIN D-3) 1000 UNITS CAPS Take 1,000 Units by mouth 2 (two) times daily.     [name] Kitchen diltiazem (CARTIA XT) 120 MG 24 hr capsule Take 1 capsule (120 mg total) by mouth at bedtime. 90 capsule 3  . levothyroxine (SYNTHROID) 100 MCG tablet Take 100 mcg by mouth daily before breakfast.    . losartan (COZAAR) 50 MG tablet Take 1 tablet (50 mg total) by mouth daily. 90 tablet 3  . Multiple Vitamin (MULTIVITAMIN) capsule Take 1 capsule by mouth daily.    . Multiple Vitamins-Minerals (PRESERVISION AREDS PO) Take 1 tablet by mouth daily.    [name] Kitchen terbinafine (LAMISIL) 250 MG tablet Take 250 mg by mouth daily.     No current facility-administered medications for this visit.      Allergies:   Amoxicillin, Flonase [fluticasone propionate], Sulfa antibiotics, Fluticasone propionate, and Sulfasalazine    Social History:  The patient  reports that she has never smoked. She has never used smokeless tobacco. She reports current alcohol use of about 1.0 standard drinks of alcohol per week. She reports that she does not use drugs.   Family History:  The patient's family history includes Anuerysm in her father; CVA in her mother; Hyperlipidemia in her mother; Hypertension in her maternal grandmother, mother, and sister; Scoliosis in her sister; Stroke in her maternal grandmother.    ROS:  Please see the history of present illness.   Otherwise, review of systems are positive for increasing exercise.   All other systems are reviewed and negative.    PHYSICAL EXAM: VS:  BP (!) 160/80   Pulse 72   Ht 5\' 6"  (1.676 m)   Wt 127 lb 12.8 oz (58 kg)   SpO2 96%   BMI 20.63 kg/m  , BMI Body mass index is 20.63 kg/m. GEN: Well nourished, well developed, in no acute distress  HEENT: normal  Neck: no JVD, carotid bruits, or masses Cardiac: RRR; no murmurs, rubs, or gallops,no edema  Respiratory:  clear to auscultation bilaterally, normal work of breathing GI: soft, nontender, nondistended, + BS MS: no deformity or atrophy  Skin: warm and dry, no rash Neuro:  Strength and sensation are intact Psych: euthymic mood, full affect    Recent Labs: 03/17/2019: Hemoglobin 14.7; Platelets 159 04/06/2019: ALT 17; BUN 13; Creat 0.73; Sodium 136 05/04/2019: Potassium 4.1; TSH 3.39   Lipid Panel    Component Value Date/Time   CHOL 271 (H) 03/17/2019 0700   TRIG 71 03/17/2019 0700   HDL 84 03/17/2019 0700   CHOLHDL 3.2 03/17/2019 0700   VLDL 13.2 12/06/2018 0937   LDLCALC 170 (H) 03/17/2019 0700   LDLDIRECT 148.1 08/17/2013 1143     Other studies Reviewed: Additional studies/ records that were reviewed today with results demonstrating:  Labs reviewed.   ASSESSMENT AND PLAN:  1.  PAF: Maintaing NSR.  Does not tolerate higher dose of dilt, continue 120 mg daily. 2. HTN:  BP still high today.  BP better since resuming exercise.  Still has some spikes to the 180 range. She is not sure that the losartan was causing her insomnia.  Increase to losartan to 100 mg.  SHe will double up on her dose. If she has more insomnia, will switch to irbesartan. 3. Varicose veins: Elevate legs.  Wears compression stockings. 4. Mild AI: No CHF sx.  5. Carotid DOppler: Mild disease in 2019.  She will get a Doppler this month.  6. Hyperlipidemia: LDL increased  since she went to [place] is richer there per her account. HDL was high.  She tried a statin for about 5 weeks, but she had perceived side effects.  COntinue to increase exercise and improve diet for now.   Current medicines are reviewed at length with the patient today.  The patient concerns regarding her medicines were addressed.  The following changes have been made:  No change  Labs/ tests ordered today include:  No orders of the defined types were placed in this encounter.   Recommend 150 minutes/week of aerobic exercise Low fat, low carb, high fiber diet recommended  Disposition:   FU in 6 months   Signed, Larae Grooms, MD  05/13/2019 3:22 PM    [facility] [place], [place], [place]  [address] Phone: [phone]; Fax: [phone]

## 2019-05-13 NOTE — Patient Instructions (Signed)
Medication Instructions:  Your physician has recommended you make the following change in your medication:   INCREASE: losartan to 100 mg once a day  Lab work: Please have your facility draw a basic metabolic panel in 1-2 weeks  If you have labs (blood work) drawn today and your tests are completely normal, you will receive your results only by: [name] Kitchen MyChart Message (if you have MyChart) OR . A paper copy in the mail If you have any lab test that is abnormal or we need to change your treatment, we will call you to review the results.  Testing/Procedures: None ordered  Follow-Up: At [facility], you and your health needs are our priority.  As part of our continuing mission to provide you with exceptional heart care, we have created designated Provider Care Teams.  These Care Teams include your primary Cardiologist (physician) and Advanced Practice Providers (APPs -  Physician Assistants and Nurse Practitioners) who all work together to provide you with the care you need, when you need it. . You will need a follow up appointment in 6 months.  Please call our office 2 months in advance to schedule this appointment.  You may see Casandra Doffing, MD or one of the following Advanced Practice Providers on your designated Care Team:   . Lyda Jester, PA-C . Dayna Dunn, PA-C . Ermalinda Barrios, PA-C  Any Other Special Instructions Will Be Listed Below (If Applicable).

## 2019-05-23 ENCOUNTER — Ambulatory Visit (HOSPITAL_COMMUNITY)
Admission: RE | Admit: 2019-05-23 | Discharge: 2019-05-23 | Disposition: A | Discharge status: Home / Self Care | Payer: Medicare HMO | Source: Ambulatory Visit | Attending: Cardiology | Admitting: Cardiology

## 2019-05-23 ENCOUNTER — Other Ambulatory Visit: Payer: Self-pay

## 2019-05-23 DIAGNOSIS — I779 Disorder of arteries and arterioles, unspecified: Secondary | ICD-10-CM

## 2019-05-23 DIAGNOSIS — Z008 Encounter for other general examination: Secondary | ICD-10-CM | POA: Diagnosis not present

## 2019-05-24 ENCOUNTER — Other Ambulatory Visit: Payer: Self-pay

## 2019-05-24 DIAGNOSIS — I779 Disorder of arteries and arterioles, unspecified: Secondary | ICD-10-CM

## 2019-06-10 NOTE — Progress Notes (Signed)
Virtual Visit via Telephone Note   This visit type was conducted due to national recommendations for restrictions regarding the COVID-19 Pandemic (e.g. social distancing) in an effort to limit this patient's exposure and mitigate transmission in our community.  Due to her co-morbid illnesses, this patient is at least at moderate risk for complications without adequate follow up.  This format is felt to be most appropriate for this patient at this time.  The patient did not have access to video technology/had technical difficulties with video requiring transitioning to audio format only (telephone).  All issues noted in this document were discussed and addressed.  No physical exam could be performed with this format.  Please refer to the patient's chart for her  consent to telehealth for [facility].   Date:  06/13/2019   ID:  Stacy [name], DOB Dec 10, 1940, MRN [id number]  Patient Location: Home Provider Location: Office  PCP:  Virgie Dad, MD  Cardiologist:  Larae Grooms, MD  Electrophysiologist:  None   Evaluation Performed:  Follow-Up Visit  Chief Complaint:  HTN  History of Present Illness:    Stacy [name] is a 78 y.o. female with who had an episode of atrial fibrillation in 5/12. She spontaneously converted within the first 24 hours. She was on xarelto, and coumadin but decided to stop anticoagulation due to the need for testing. Her symptoms were very distinct.   SHe has had varicose veins. She did not have any pain. Mild swelling bilaterally, much improved after changing to Cartia XT instead of diltiazem.  Leg swelling better with compression stockings.She was diagnosed with a hiatal hernia and has occasional GERD. She took a PPI but this was stopped.  She works in the yard for activity in the past.  In May 2020, she moved to [facility] independent living. She has meals delivered and less contact with people from the outside. She switched to Dr.  Lyndel Safe who visits [facility].  Increased losartan to 50 mg in 03/2019 to help with BP. She feels that she is urinating more since increasing the medicine. BP has been better, but still high. Systolics down to the [id number], from the 200s. HR has been in the 62-78 range.   Had worsening insomnia as well. Wants to go back to the [facility]. She does wear a mask regularly.  In early 04/2019, plan was :"Increase Diltiazem to 240 mg daily. If she has better BPs, will call in 240 mg tabs. OK to increase exercise. If she goes back to the gym, she needs to wear a mask and socially distance."  BP initially improved with higher dilt dose, but then went back up to [id number] systolic.  HR slowed down on the higher dose of dilt so this was decreased back to 120 mg daily.  Constipation has improved.    BP better since resuming exercise.  Still has some spikes to the 180 range.  Plan at last visit was: " Increase to losartan to 100 mg.  SHe will double up on her dose. If she has more insomnia, will switch to irbesartan.      LDL increased since she went to [place] is richer there per her account. HDL was high.  She tried a statin for about 5 weeks, but she had perceived side effects.  COntinue to increase exercise and improve diet for now."  The patient does not have symptoms concerning for COVID-19 infection (fever, chills, cough, or new shortness of breath).   BP  improved but then went back up to the [id number] systolic.  SHe prefers trying telmisartan to irbesartan.  Past Medical History:  Diagnosis Date  . Atrial fibrillation ([place])    echo 12/23/10 EF= >55%, stress myoview 01/30/11 normal pattern of perfusion in all myocardial regions  . Carotid artery stenosis    Per [facility] new patient packet   . Chicken pox   . Colon polyp   . Colon polyp   . Dyslipidemia   . Heart murmur   . Hiatal hernia   . Hypothyroidism   . Laryngopharyngeal reflux   . Osteopenia   . Scoliosis   .  Seasonal allergies   . Thyroid disease   . Vitamin D deficiency    Past Surgical History:  Procedure Laterality Date  . BREAST CYST EXCISION Right 1988  . ESOPHAGEAL MANOMETRY N/A 08/29/2015   Procedure: ESOPHAGEAL MANOMETRY (EM);  Surgeon: Manus Gunning, MD;  Location: [facility];  Service: Gastroenterology;  Laterality: N/A;  . EYE SURGERY     Cataract eye surgery-right 06/2014, left 04/2014  . TONSILLECTOMY     Childhood     Current Meds  Medication Sig  . aspirin 81 MG tablet Take 81 mg by mouth daily.  . Cholecalciferol (VITAMIN D-3) 1000 UNITS CAPS Take 1,000 Units by mouth 2 (two) times daily.   [name] Kitchen diltiazem (CARTIA XT) 120 MG 24 hr capsule Take 1 capsule (120 mg total) by mouth at bedtime.  [name] Kitchen levothyroxine (SYNTHROID) 100 MCG tablet Take 100 mcg by mouth daily before breakfast.  . losartan (COZAAR) 100 MG tablet Take 1 tablet (100 mg total) by mouth daily.  . Multiple Vitamin (MULTIVITAMIN) capsule Take 1 capsule by mouth daily.  . Multiple Vitamins-Minerals (PRESERVISION AREDS PO) Take 1 tablet by mouth daily.  [name] Kitchen terbinafine (LAMISIL) 250 MG tablet Take 250 mg by mouth daily.     Allergies:   Amoxicillin, Flonase [fluticasone propionate], Sulfa antibiotics, Fluticasone propionate, and Sulfasalazine   Social History   Tobacco Use  . Smoking status: Never Smoker  . Smokeless tobacco: Never Used  Substance Use Topics  . Alcohol use: Yes    Alcohol/week: 1.0 standard drinks    Types: 1 Glasses of wine per week    Comment: 1 glass of wine 3x a week  . Drug use: No     Family Hx: The patient's family history includes Anuerysm in her father; CVA in her mother; Hyperlipidemia in her mother; Hypertension in her maternal grandmother, mother, and sister; Scoliosis in her sister; Stroke in her maternal grandmother. There is no history of Colon cancer, Heart attack, or Breast cancer.  ROS:   Please see the history of present illness.     All other systems reviewed  and are negative.   Prior CV studies:   The following studies were reviewed today:    Labs/Other Tests and Data Reviewed:    EKG:  No ECG reviewed.  Recent Labs: 03/17/2019: Hemoglobin 14.7; Platelets 159 04/06/2019: ALT 17; BUN 13; Creat 0.73; Sodium 136 05/04/2019: Potassium 4.1; TSH 3.39   Recent Lipid Panel Lab Results  Component Value Date/Time   CHOL 271 (H) 03/17/2019 07:00 AM   TRIG 71 03/17/2019 07:00 AM   HDL 84 03/17/2019 07:00 AM   CHOLHDL 3.2 03/17/2019 07:00 AM   LDLCALC 170 (H) 03/17/2019 07:00 AM   LDLDIRECT 148.1 08/17/2013 11:43 AM    Wt Readings from Last 3 Encounters:  06/13/19 125 lb (56.7 kg)  05/13/19 127 lb 12.8 oz (58  kg)  05/06/19 127 lb 12.8 oz (58 kg)     Objective:    Vital Signs:  BP (!) 180/94   Pulse 75   Ht 5\' 6"  (1.676 m)   Wt 125 lb (56.7 kg)   BMI 20.18 kg/m    VITAL SIGNS:  reviewed GEN:  no acute distress RESPIRATORY:  normal respiratory effort, symmetric expansion NEURO:  alert and oriented x 3, no obvious focal deficit PSYCH:  normal affect exam limited by phone format  ASSESSMENT & PLAN:    1. PAF: No palpitations. Aspirin at this time.  2. HTN: BP increased at times after having an initial improvement.  Stop losartan.  Start telmisartan 40 mg daily.  WIll increase to 80 mg dially if BP stays high.  3. Varicose veins: Wearing compression stockings.  4. Mild AI: No CHF.   5. Carotid disease: stable carotid disease in 2020.  6. Hyperlipidemia: LDL 170.  Staying active and healthy diet recommended.   COVID-19 Education: The signs and symptoms of COVID-19 were discussed with the patient and how to seek care for testing (follow up with PCP or arrange E-visit).  The importance of social distancing was discussed today.  Time:   Today, I have spent 20 minutes with the patient with telehealth technology discussing the above problems.     Medication Adjustments/Labs and Tests Ordered: Current medicines are reviewed at  length with the patient today.  Concerns regarding medicines are outlined above.   Tests Ordered: No orders of the defined types were placed in this encounter.   Medication Changes: No orders of the defined types were placed in this encounter.   Follow Up:  Virtual Visit  in 2 week(s)  Signed, Larae Grooms, MD  06/13/2019 2:26 PM    [place]

## 2019-06-13 ENCOUNTER — Encounter: Payer: Self-pay | Admitting: Interventional Cardiology

## 2019-06-13 ENCOUNTER — Other Ambulatory Visit: Payer: Self-pay

## 2019-06-13 ENCOUNTER — Encounter: Payer: Self-pay | Admitting: Internal Medicine

## 2019-06-13 ENCOUNTER — Telehealth (INDEPENDENT_AMBULATORY_CARE_PROVIDER_SITE_OTHER): Payer: Medicare HMO | Admitting: Interventional Cardiology

## 2019-06-13 VITALS — BP 180/94 | HR 75 | Ht 66.0 in | Wt 125.0 lb

## 2019-06-13 DIAGNOSIS — I779 Disorder of arteries and arterioles, unspecified: Secondary | ICD-10-CM

## 2019-06-13 DIAGNOSIS — I10 Essential (primary) hypertension: Secondary | ICD-10-CM | POA: Diagnosis not present

## 2019-06-13 DIAGNOSIS — I83893 Varicose veins of bilateral lower extremities with other complications: Secondary | ICD-10-CM | POA: Diagnosis not present

## 2019-06-13 DIAGNOSIS — I480 Paroxysmal atrial fibrillation: Secondary | ICD-10-CM | POA: Diagnosis not present

## 2019-06-13 DIAGNOSIS — E785 Hyperlipidemia, unspecified: Secondary | ICD-10-CM | POA: Diagnosis not present

## 2019-06-13 DIAGNOSIS — I351 Nonrheumatic aortic (valve) insufficiency: Secondary | ICD-10-CM

## 2019-06-13 MED ORDER — TELMISARTAN 40 MG PO TABS: 40.0000 mg | ORAL_TABLET | Freq: Every day | ORAL | 3 refills | Status: DC

## 2019-06-13 NOTE — Patient Instructions (Addendum)
Medication Instructions:  Your physician has recommended you make the following change in your medication:   1. STOP: losartan  2. START: telmisartan 40 mg once a day  If you need a refill on your cardiac medications before your next appointment, please call your pharmacy.   Lab work: None Ordered  If you have labs (blood work) drawn today and your tests are completely normal, you will receive your results only by: [name] Kitchen MyChart Message (if you have MyChart) OR . A paper copy in the mail If you have any lab test that is abnormal or we need to change your treatment, we will call you to review the results.  Testing/Procedures: None ordered  Follow-Up: . Follow up with Dr. Irish Lack via Phone Visit on 07/05/19 at 2:40 PM  Any Other Special Instructions Will Be Listed Below (If Applicable).

## 2019-06-16 ENCOUNTER — Telehealth: Payer: Self-pay | Admitting: Interventional Cardiology

## 2019-06-16 NOTE — Telephone Encounter (Signed)
Called and spoke to [employer]. They had not ran the telmisartan through her insurance. They state that they ran it through and there is no change for the patient. Called and made the patient aware. She verbalized understanding and thanked me for the call.

## 2019-06-16 NOTE — Telephone Encounter (Signed)
New Message  Pt c/o medication issue: 1. Name of Medication: telmisartan (MICARDIS) 40 MG tablet  2. How are you currently taking this medication (dosage and times per day)?  Take 1 tablet (40 mg total) by mouth daily. 3. Are you having a reaction (difficulty breathing--STAT)? no  4. What is your medication issue? Patient was unable to pick up medication from pharmacy due to it need approval from the insurance. Patient has continued taking the Losartan once a day until she is able to get the new medication from pharmacy. Please call patient back to assist.

## 2019-07-03 NOTE — Progress Notes (Signed)
Virtual Visit via Telephone Note   This visit type was conducted due to national recommendations for restrictions regarding the COVID-19 Pandemic (e.g. social distancing) in an effort to limit this patient's exposure and mitigate transmission in our community.  Due to her co-morbid illnesses, this patient is at least at moderate risk for complications without adequate follow up.  This format is felt to be most appropriate for this patient at this time.  The patient did not have access to video technology/had technical difficulties with video requiring transitioning to audio format only (telephone).  All issues noted in this document were discussed and addressed.  No physical exam could be performed with this format.  Please refer to the patient's chart for her  consent to telehealth for [facility].   Date:  07/05/2019   ID:  Stacy [name], DOB Feb 18, 1941, MRN [id number]  Patient Location: Home Provider Location: Office  PCP:  Stacy Dad, MD  Cardiologist:  Stacy Grooms, MD  Electrophysiologist:  None   Evaluation Performed:  Follow-Up Visit  Chief Complaint:  HTN  History of Present Illness:    Stacy [name] is a 78 y.o. female with who had an episode of atrial fibrillation in 5/12. She spontaneously converted within the first 24 hours. She was on xarelto, and coumadin but decided to stop anticoagulation due to the need for testing. Her symptoms were very distinct.   SHe has had varicose veins. She did not have any pain. Mild swelling bilaterally, much improved after changing to Cartia XT instead of diltiazem.  Leg swelling better with compression stockings.She was diagnosed with a hiatal hernia and has occasional GERD. She took a PPI but this was stopped.  She works in the yard for activity in the past.  In May 2020, she moved to [facility] independent living. She has meals delivered and less contact with people from the outside. She switched to Stacy [name] who visits [facility].  Increased losartan to 50 mg in 03/2019 to help with BP. She feels that she is urinating more since increasing the medicine. BP has been better, but still high. Systolics down to the XX123456, from the 200s. HR has been in the 62-78 range.   Had worsening insomnia as well. Wants to go back to the [facility]. She does wear a mask regularly.  In early 04/2019, plan was :"Increase Diltiazem to 240 mg daily. If she has better BPs, will call in 240 mg tabs. OK to increase exercise. If she goes back to the gym, she needs to wear a mask and socially distance."  BP initially improved with higher dilt dose, but then went back up to XX123456 systolic.  HR slowed down on the higher dose of dilt so this was decreased back to 120 mg daily. Constipation has improved.   BP better since resuming exercise. Still has some spikes to the 180 range.  Plan at last visit was: "Increase to losartan to 100 mg. SHe will double up on her dose. If she has more insomnia, will switch to irbesartan.     LDL increased since she went to [place] is richer there per her account. HDL was high. She tried a statin for about 5 weeks, but she had perceived side effects. COntinue to increase exercise and improve diet for now."  In early 06/2019, we switched to telmisartan per her request.  Plan was to start at 40 mg and increase to 80 mg if BP stayed high.  The  patient does not have symptoms concerning for COVID-19 infection (fever, chills, cough, or new shortness of breath).   No side effects of the telmisartan.  SHe is concerned about increasing to 80 mg daily.  She continues to exercise at the gym.   If gyms close, she can walk at her facility.    Past Medical History:  Diagnosis Date  . Atrial fibrillation ([place])    echo 12/23/10 EF= >55%, stress myoview 01/30/11 normal pattern of perfusion in all myocardial regions  . Carotid artery stenosis    Per [facility] new  patient packet   . Chicken pox   . Colon polyp   . Colon polyp   . Dyslipidemia   . Heart murmur   . Hiatal hernia   . Hypothyroidism   . Laryngopharyngeal reflux   . Osteopenia   . Scoliosis   . Seasonal allergies   . Thyroid disease   . Vitamin D deficiency    Past Surgical History:  Procedure Laterality Date  . BREAST CYST EXCISION Right 1988  . ESOPHAGEAL MANOMETRY N/A 08/29/2015   Procedure: ESOPHAGEAL MANOMETRY (EM);  Surgeon: Stacy Gunning, MD;  Location: [facility];  Service: Gastroenterology;  Laterality: N/A;  . EYE SURGERY     Cataract eye surgery-right 06/2014, left 04/2014  . TONSILLECTOMY     Childhood     Current Meds  Medication Sig  . aspirin 81 MG tablet Take 81 mg by mouth daily.  . Cholecalciferol (VITAMIN D-3) 1000 UNITS CAPS Take 1,000 Units by mouth 2 (two) times daily.   [name] Kitchen diltiazem (CARTIA XT) 120 MG 24 hr capsule Take 1 capsule (120 mg total) by mouth at bedtime.  [name] Kitchen levothyroxine (SYNTHROID) 100 MCG tablet Take 100 mcg by mouth daily before breakfast.  . Multiple Vitamin (MULTIVITAMIN) capsule Take 1 capsule by mouth daily.  . Multiple Vitamins-Minerals (PRESERVISION AREDS PO) Take 1 tablet by mouth daily.  [name] Kitchen telmisartan (MICARDIS) 40 MG tablet Take 1 tablet (40 mg total) by mouth daily.     Allergies:   Amoxicillin, Flonase [fluticasone propionate], Sulfa antibiotics, Fluticasone propionate, and Sulfasalazine   Social History   Tobacco Use  . Smoking status: Never Smoker  . Smokeless tobacco: Never Used  Substance Use Topics  . Alcohol use: Yes    Alcohol/week: 1.0 standard drinks    Types: 1 Glasses of wine per week    Comment: 1 glass of wine 3x a week  . Drug use: No     Family Hx: The patient's family history includes Anuerysm in her father; CVA in her mother; Hyperlipidemia in her mother; Hypertension in her maternal grandmother, mother, and sister; Scoliosis in her sister; Stroke in her maternal grandmother. There is no  history of Colon cancer, Heart attack, or Breast cancer.  ROS:   Please see the history of present illness.    Readings in the 160s. All other systems reviewed and are negative.   Prior CV studies:   The following studies were reviewed today:    Labs/Other Tests and Data Reviewed:      Recent Labs: 03/17/2019: Hemoglobin 14.7; Platelets 159 04/06/2019: ALT 17; BUN 13; Creat 0.73; Sodium 136 05/04/2019: Potassium 4.1; TSH 3.39   Recent Lipid Panel Lab Results  Component Value Date/Time   CHOL 271 (H) 03/17/2019 07:00 AM   TRIG 71 03/17/2019 07:00 AM   HDL 84 03/17/2019 07:00 AM   CHOLHDL 3.2 03/17/2019 07:00 AM   LDLCALC 170 (H) 03/17/2019 07:00 AM   LDLDIRECT 148.1  08/17/2013 11:43 AM    Wt Readings from Last 3 Encounters:  07/05/19 127 lb (57.6 kg)  06/13/19 125 lb (56.7 kg)  05/13/19 127 lb 12.8 oz (58 kg)     Objective:    Vital Signs:  BP (!) 162/84   Pulse 76   Ht 5\' 6"  (1.676 m)   Wt 127 lb (57.6 kg)   BMI 20.50 kg/m    VITAL SIGNS:  reviewed GEN:  no acute distress RESPIRATORY:  no shortness of breath PSYCH:  normal affect exam limited by phone format  ASSESSMENT & PLAN:    1. PAF: No palpitations. 2. HTN: No side effects of the telmisartan.  SHe is concerned about increasing to 80 mg daily.  She continues to exercise at the gym. She will check her readings for  afew more weeks, paying attention to rest for 10 minutes before every reading. 3. Varicose veins: stable. Elevate legs.  4. Mild AI: No CHF sx.  5. Carotid disease: routine DOpplers.  6. Hyperlipidemia: High HDL as well.   COVID-19 Education: The signs and symptoms of COVID-19 were discussed with the patient and how to seek care for testing (follow up with PCP or arrange E-visit).  The importance of social distancing was discussed today.  Time:   Today, I have spent 15 minutes with the patient with telehealth technology discussing the above problems.     Medication Adjustments/Labs and  Tests Ordered: Current medicines are reviewed at length with the patient today.  Concerns regarding medicines are outlined above.   Tests Ordered: No orders of the defined types were placed in this encounter.   Medication Changes: No orders of the defined types were placed in this encounter.   Follow Up:  Either In Person or Virtual in 2 week(s)  Signed, Stacy Grooms, MD  07/05/2019 2:43 PM    [place]

## 2019-07-05 ENCOUNTER — Telehealth (INDEPENDENT_AMBULATORY_CARE_PROVIDER_SITE_OTHER): Payer: Medicare HMO | Admitting: Interventional Cardiology

## 2019-07-05 ENCOUNTER — Encounter: Payer: Self-pay | Admitting: Interventional Cardiology

## 2019-07-05 ENCOUNTER — Other Ambulatory Visit: Payer: Self-pay

## 2019-07-05 VITALS — BP 162/84 | HR 76 | Ht 66.0 in | Wt 127.0 lb

## 2019-07-05 DIAGNOSIS — I779 Disorder of arteries and arterioles, unspecified: Secondary | ICD-10-CM | POA: Diagnosis not present

## 2019-07-05 DIAGNOSIS — I83893 Varicose veins of bilateral lower extremities with other complications: Secondary | ICD-10-CM | POA: Diagnosis not present

## 2019-07-05 DIAGNOSIS — E785 Hyperlipidemia, unspecified: Secondary | ICD-10-CM

## 2019-07-05 DIAGNOSIS — I480 Paroxysmal atrial fibrillation: Secondary | ICD-10-CM

## 2019-07-05 DIAGNOSIS — I10 Essential (primary) hypertension: Secondary | ICD-10-CM | POA: Diagnosis not present

## 2019-07-05 DIAGNOSIS — I351 Nonrheumatic aortic (valve) insufficiency: Secondary | ICD-10-CM

## 2019-07-05 NOTE — Patient Instructions (Signed)
Medication Instructions:  Your physician recommends that you continue on your current medications as directed. Please refer to the Current Medication list given to you today.  If you need a refill on your cardiac medications before your next appointment, please call your pharmacy.   Lab work: None Ordered  If you have labs (blood work) drawn today and your tests are completely normal, you will receive your results only by: [name] Kitchen MyChart Message (if you have MyChart) OR . A paper copy in the mail If you have any lab test that is abnormal or we need to change your treatment, we will call you to review the results.  Testing/Procedures: None ordered  Follow-Up: Your physician has requested that you regularly monitor and record your blood pressure readings at home. Please use the same machine at the same time of day to check your readings and record them. Call to report readings in a couple of weeks.   Any Other Special Instructions Will Be Listed Below (If Applicable).

## 2019-07-08 ENCOUNTER — Encounter: Payer: Self-pay | Admitting: Internal Medicine

## 2019-07-08 ENCOUNTER — Other Ambulatory Visit: Payer: Self-pay

## 2019-07-08 ENCOUNTER — Non-Acute Institutional Stay: Payer: Medicare HMO | Admitting: Internal Medicine

## 2019-07-08 VITALS — BP 178/90 | HR 78 | Temp 97.8°F | Ht 66.0 in | Wt 129.0 lb

## 2019-07-08 DIAGNOSIS — M810 Age-related osteoporosis without current pathological fracture: Secondary | ICD-10-CM

## 2019-07-08 DIAGNOSIS — E039 Hypothyroidism, unspecified: Secondary | ICD-10-CM | POA: Diagnosis not present

## 2019-07-08 DIAGNOSIS — I10 Essential (primary) hypertension: Secondary | ICD-10-CM

## 2019-07-08 DIAGNOSIS — R634 Abnormal weight loss: Secondary | ICD-10-CM | POA: Diagnosis not present

## 2019-07-08 DIAGNOSIS — I480 Paroxysmal atrial fibrillation: Secondary | ICD-10-CM

## 2019-07-08 DIAGNOSIS — R69 Illness, unspecified: Secondary | ICD-10-CM | POA: Diagnosis not present

## 2019-07-08 DIAGNOSIS — F339 Major depressive disorder, recurrent, unspecified: Secondary | ICD-10-CM

## 2019-07-08 DIAGNOSIS — R609 Edema, unspecified: Secondary | ICD-10-CM | POA: Diagnosis not present

## 2019-07-08 MED ORDER — ESCITALOPRAM OXALATE 5 MG PO TABS: 5.0000 mg | ORAL_TABLET | Freq: Every day | ORAL | 2 refills | Status: DC

## 2019-07-08 NOTE — Progress Notes (Signed)
Location: [place] of Service:  Clinic (12)  Provider:   Code Status:  Goals of Care:  Advanced Directives 05/06/2019  Does Patient Have a Medical Advance Directive? Yes  Type of Advance Directive [place]  Does patient want to make changes to medical advance directive? No - Patient declined  Copy of [place] in Chart? Yes - validated most recent copy scanned in chart (See row information)  Would patient like information on creating a medical advance directive? -     Chief Complaint  Patient presents with  . Medical Management of Chronic Issues    2 month follow up on blood pressure  . Health Maintenance    PCV13, dexa scan     HPI: Patient is a 78 y.o. female seen today for an acute visit for Follow up for her BP and Possible Anxiety/ Depression  Patient has h/o Hypothyroidism, Scoliosis, Hyperlipidemia, PAD with H/o Right ICA stenosis Also has h/o PAF one episode in 2012 has stayed in Sinus rhythm , LE edema   Hypertension BP still high  On Micardis now. Her BP at home is running 160. She follows very closely with Dr Irish Lack Cardiology and is planning to increase her Dose in Few weeks Her detail work up for the BP was normal with Hypothyroid Saw Endocinologist for detail work Doing well with TSH and T4 normal Hyperlipidemia Refuses statin for now wants to continue Diet and Exercise ? Anxiety and depression She thinks her BP is not controlled due to her New Move to [facility] and Subclinical Anxiety. Though her appetie is good. Is gaining weight now. Exercising and Sleeping better  Past Medical History:  Diagnosis Date  . Atrial fibrillation ([place])    echo 12/23/10 EF= >55%, stress myoview 01/30/11 normal pattern of perfusion in all myocardial regions  . Carotid artery stenosis    Per [facility] new patient packet   . Chicken pox   . Colon polyp   . Colon polyp   . Dyslipidemia   . Heart murmur   . Hiatal  hernia   . Hypothyroidism   . Laryngopharyngeal reflux   . Osteopenia   . Scoliosis   . Seasonal allergies   . Thyroid disease   . Vitamin D deficiency     Past Surgical History:  Procedure Laterality Date  . BREAST CYST EXCISION Right 1988  . ESOPHAGEAL MANOMETRY N/A 08/29/2015   Procedure: ESOPHAGEAL MANOMETRY (EM);  Surgeon: Manus Gunning, MD;  Location: [facility];  Service: Gastroenterology;  Laterality: N/A;  . EYE SURGERY     Cataract eye surgery-right 06/2014, left 04/2014  . TONSILLECTOMY     Childhood    Allergies  Allergen Reactions  . Amoxicillin Swelling  . Flonase [Fluticasone Propionate] Swelling    Swelling of throat per patient Swelling of throat per patient  . Sulfa Antibiotics Other (See Comments)    Upset stomach  . Fluticasone Propionate Swelling    Swelling of throat per patient  . Sulfasalazine Other (See Comments)    Upset stomach    Outpatient Encounter Medications as of 07/08/2019  Medication Sig  . aspirin 81 MG tablet Take 81 mg by mouth daily.  . Cholecalciferol (VITAMIN D-3) 1000 UNITS CAPS Take 1,000 Units by mouth 2 (two) times daily.   [name] Kitchen diltiazem (CARTIA XT) 120 MG 24 hr capsule Take 1 capsule (120 mg total) by mouth at bedtime.  [name] Kitchen levothyroxine (SYNTHROID) 100 MCG tablet Take  100 mcg by mouth daily before breakfast.  . Multiple Vitamin (MULTIVITAMIN) capsule Take 1 capsule by mouth daily.  . Multiple Vitamins-Minerals (PRESERVISION AREDS PO) Take 1 tablet by mouth daily.  [name] Kitchen telmisartan (MICARDIS) 40 MG tablet Take 1 tablet (40 mg total) by mouth daily.  . [DISCONTINUED] terbinafine (LAMISIL) 250 MG tablet Take 250 mg by mouth daily.   No facility-administered encounter medications on file as of 07/08/2019.     Review of Systems:  Review of Systems  Review of Systems  Constitutional: Negative for activity change, appetite change, chills, diaphoresis, fatigue and fever.  HENT: Negative for mouth sores, postnasal drip,  rhinorrhea, sinus pain and sore throat.   Respiratory: Negative for apnea, cough, chest tightness, shortness of breath and wheezing.   Cardiovascular: Negative for chest pain, palpitations and leg swelling.  Gastrointestinal: Negative for abdominal distention, abdominal pain, constipation, diarrhea, nausea and vomiting.  Genitourinary: Negative for dysuria and frequency.  Musculoskeletal: Negative for arthralgias, joint swelling and myalgias.  Skin: Negative for rash.  Neurological: Negative for dizziness, syncope, weakness, light-headedness and numbness.  Psychiatric/Behavioral: Negative for behavioral problems, confusion and sleep disturbance.     Health Maintenance  Topic Date Due  . PNA vac Low Risk Adult (1 of 2 - PCV13) 10/06/2004  . DEXA SCAN  09/24/2018  . TETANUS/TDAP  06/06/2020  . INFLUENZA VACCINE  Completed    Physical Exam: Vitals:   07/08/19 0958  BP: (!) 178/90  Pulse: 78  Temp: 97.8 F (36.6 C)  SpO2: 95%  Weight: 129 lb (58.5 kg)  Height: 5\' 6"  (1.676 m)   Body mass index is 20.82 kg/m. Physical Exam  Constitutional: Oriented to person, place, and time. Well-developed and well-nourished.  HENT:  Head: Normocephalic.  Mouth/Throat: Oropharynx is clear and moist.  Eyes: Pupils are equal, round, and reactive to light.  Neck: Neck supple.  Cardiovascular: Normal rate and normal heart sounds.  No murmur heard. Pulmonary/Chest: Effort normal and breath sounds normal. No respiratory distress. No wheezes. She has no rales.  Abdominal: Soft. Bowel sounds are normal. No distension. There is no tenderness. There is no rebound.  Musculoskeletal: Mild edema  Lymphadenopathy: none Neurological: Alert and oriented to person, place, and time.  Skin: Skin is warm and dry.  Psychiatric: Normal mood and affect. Behavior is normal. Thought content normal.    Labs reviewed: Basic Metabolic Panel: Recent Labs    12/06/18 0937 03/17/19 0700 04/06/19 0000 05/04/19  1152  NA  --  137 136  --   K  --  3.8 3.8 4.1  CL  --  100 100  --   CO2  --  29 28  --   GLUCOSE  --  97 83  --   BUN  --  14 13  --   CREATININE  --  0.73 0.73  --   CALCIUM  --  9.8 9.4  --   TSH 0.51 1.63  --  3.39   Liver Function Tests: Recent Labs    03/17/19 0700 04/06/19 0000  AST 20 17  ALT 17 17  BILITOT 0.8 0.7  PROT 6.8 6.3   No results for input(s): LIPASE, AMYLASE in the last 8760 hours. No results for input(s): AMMONIA in the last 8760 hours. CBC: Recent Labs    03/17/19 0700  WBC 5.2  NEUTROABS 2,688  HGB 14.7  HCT 43.8  MCV 96.7  PLT 159   Lipid Panel: Recent Labs    12/06/18 0937 03/17/19 0700  CHOL 211* 271*  HDL 74.90 84  LDLCALC 123* 170*  TRIG 66.0 71  CHOLHDL 3 3.2   No results found for: HGBA1C  Procedures since last visit: No results found.  Assessment/Plan Essential hypertension Patient is followed closely By Cardiology She had detail work up by Endocrinology for her BP and it was all negative Her BP have been in the range if 160-170 Did not Tolerate Higher doses of Cardizem due to Bradycardia with AV block  Hypothyroidism, unspecified type Was seen By endocrinologist for her Thyroid No changes were made She will follow with me in future  Weight loss Is now gaining it back  Edema, unspecified type Does not want to try Diuretics  Paroxysmal atrial fibrillation ([place]) Follows with cardiology Has been stable on Cardizem Not on any anticoagulation Scoliosis Follows with a scoliosis specialist Osteoporosis, Has refused Treatment at this time  Depression Wants to try Some treatment to see if it will help with her Anxiety and then BP Will start her on Lexapro Hyperlipidemia Tried Statins and did tolerate it Wants to continue diet and exercise    Labs/tests ordered:  * No order type specified * Next appt:  Visit date not found

## 2019-07-19 ENCOUNTER — Encounter: Payer: Self-pay | Admitting: Internal Medicine

## 2019-07-19 NOTE — Telephone Encounter (Signed)
Message routed to Gupta, Anjali L, MD  °

## 2019-08-11 ENCOUNTER — Telehealth: Payer: Self-pay | Admitting: Interventional Cardiology

## 2019-08-11 DIAGNOSIS — Z6820 Body mass index (BMI) 20.0-20.9, adult: Secondary | ICD-10-CM | POA: Diagnosis not present

## 2019-08-11 DIAGNOSIS — M545 Low back pain: Secondary | ICD-10-CM | POA: Diagnosis not present

## 2019-08-11 DIAGNOSIS — M5416 Radiculopathy, lumbar region: Secondary | ICD-10-CM | POA: Diagnosis not present

## 2019-08-11 DIAGNOSIS — M419 Scoliosis, unspecified: Secondary | ICD-10-CM | POA: Diagnosis not present

## 2019-08-11 NOTE — Telephone Encounter (Signed)
Follow up  ° ° °Pt is returning call  ° ° °Please call back  °

## 2019-08-11 NOTE — Telephone Encounter (Signed)
New Message    Pt is calling to speak with Stacy [name]. She says she started a new medication and wants to discuss it with her    Please call

## 2019-08-11 NOTE — Telephone Encounter (Signed)
Attempted to reach patient but there was no answer and VM did not pick up.

## 2019-08-11 NOTE — Telephone Encounter (Signed)
Left message for patient to call back  

## 2019-08-13 DIAGNOSIS — I10 Essential (primary) hypertension: Secondary | ICD-10-CM

## 2019-08-16 MED ORDER — TELMISARTAN 80 MG PO TABS: 80.0000 mg | ORAL_TABLET | Freq: Every day | ORAL | 3 refills | Status: DC

## 2019-08-18 NOTE — Telephone Encounter (Signed)
Addressed in [place] message on 08/13/19.

## 2019-08-18 NOTE — Telephone Encounter (Signed)
Called and left a message on Tinika Brinkley's VM to call back to arrange for labs.  Attempted to call the 2nd number but there was no answer and VM did not pick up.

## 2019-08-22 DIAGNOSIS — R69 Illness, unspecified: Secondary | ICD-10-CM | POA: Diagnosis not present

## 2019-08-29 ENCOUNTER — Other Ambulatory Visit: Payer: Self-pay

## 2019-08-29 ENCOUNTER — Telehealth: Payer: Self-pay | Admitting: Interventional Cardiology

## 2019-08-29 ENCOUNTER — Encounter: Payer: Self-pay | Admitting: Internal Medicine

## 2019-08-29 DIAGNOSIS — I10 Essential (primary) hypertension: Secondary | ICD-10-CM

## 2019-08-29 NOTE — Telephone Encounter (Signed)
Left message for Stacy [name]. Lab orders have been faxed.

## 2019-08-29 NOTE — Telephone Encounter (Signed)
Stacy [name] from [place] was reaching out to [place] in regards to some lab orders for this patient.  Please fax lab orders to Stacy [name] at [phone]

## 2019-08-30 NOTE — Telephone Encounter (Signed)
Lab orders faxed by other RN. See phone note from yesterday.

## 2019-09-01 DIAGNOSIS — I10 Essential (primary) hypertension: Secondary | ICD-10-CM | POA: Diagnosis not present

## 2019-09-02 ENCOUNTER — Telehealth: Payer: Self-pay | Admitting: Interventional Cardiology

## 2019-09-02 NOTE — Telephone Encounter (Signed)
Will check with PharmD what the likelihood that tendon inflammation is related to telmisartan.  Regardless, I am ok with her going back to losartan if she would prefer.  Equivalent dose would be 100 mg of losartan.  JV

## 2019-09-02 NOTE — Telephone Encounter (Signed)
I spoke to the patient who said that she would prefer Irbesartan over Losartan, if possible.  She said that she was going to be put on Irbesartan, but decided upon Telmisartan.  She apologized for the confusion.

## 2019-09-02 NOTE — Telephone Encounter (Signed)
Pt c/o medication issue:  1. Name of Medication: Telmisartan  2. How are you currently taking this medication (dosage and times per day) 1 time a day  3. Are you having a reaction (difficulty breathing--STAT)? no  4. What is your medication issue?inflamation of the left side tendon and back pain

## 2019-09-02 NOTE — Telephone Encounter (Signed)
I spoke to the patient who called because 2 weeks ago her Telmisartan was increased to 80 mg Daily from 40 mg.  She has had back pain and inflammation of the tendon in her left thigh.  She would like to go back to her Losartan, if possible.

## 2019-09-04 NOTE — Telephone Encounter (Signed)
OK to use irbesartan 300 mg daily as this would be the equivalent dose.  BMet 1-2 weeks later.  JV

## 2019-09-05 MED ORDER — IRBESARTAN 300 MG PO TABS: 300.0000 mg | ORAL_TABLET | Freq: Every day | ORAL | 3 refills | Status: DC

## 2019-09-05 NOTE — Telephone Encounter (Signed)
Spoke to patient regarding change to irbesartan and repeat BMET. See telephone encounter 09/02/19.

## 2019-09-05 NOTE — Telephone Encounter (Signed)
Called and spoke to patient. Made her aware of recommendations to switch telmisartan to irbesartan 300 mg QD and repeat BMET in 1-2 weeks. Will fax orders for BMET to Ludger Nutting, resident nurse at patient's facility.

## 2019-09-27 DIAGNOSIS — Z79899 Other long term (current) drug therapy: Secondary | ICD-10-CM | POA: Diagnosis not present

## 2019-09-27 DIAGNOSIS — I482 Chronic atrial fibrillation, unspecified: Secondary | ICD-10-CM | POA: Diagnosis not present

## 2019-09-27 DIAGNOSIS — I10 Essential (primary) hypertension: Secondary | ICD-10-CM | POA: Diagnosis not present

## 2019-09-27 DIAGNOSIS — I351 Nonrheumatic aortic (valve) insufficiency: Secondary | ICD-10-CM | POA: Diagnosis not present

## 2019-09-28 ENCOUNTER — Encounter: Payer: Self-pay | Admitting: Internal Medicine

## 2019-09-28 NOTE — Telephone Encounter (Signed)
Message routed to Gupta, Anjali L, MD  °

## 2019-10-07 ENCOUNTER — Other Ambulatory Visit: Payer: Self-pay

## 2019-10-07 ENCOUNTER — Encounter: Payer: Self-pay | Admitting: Internal Medicine

## 2019-10-07 ENCOUNTER — Non-Acute Institutional Stay: Payer: Medicare HMO | Admitting: Internal Medicine

## 2019-10-07 VITALS — BP 196/100 | HR 69 | Temp 98.0°F | Ht 66.0 in | Wt 129.6 lb

## 2019-10-07 DIAGNOSIS — I480 Paroxysmal atrial fibrillation: Secondary | ICD-10-CM

## 2019-10-07 DIAGNOSIS — I10 Essential (primary) hypertension: Secondary | ICD-10-CM | POA: Diagnosis not present

## 2019-10-07 DIAGNOSIS — Z7189 Other specified counseling: Secondary | ICD-10-CM

## 2019-10-07 DIAGNOSIS — E039 Hypothyroidism, unspecified: Secondary | ICD-10-CM

## 2019-10-07 MED ORDER — LORAZEPAM 0.5 MG PO TABS: 0.5000 mg | ORAL_TABLET | Freq: Every evening | ORAL | 0 refills | Status: DC | PRN

## 2019-10-07 NOTE — Progress Notes (Signed)
Location: [place] of Service:  Clinic (12)  Provider:   Code Status:  Goals of Care:  Advanced Directives 05/06/2019  Does Patient Have a Medical Advance Directive? Yes  Type of Advance Directive [place]  Does patient want to make changes to medical advance directive? No - Patient declined  Copy of [place] in Chart? Yes - validated most recent copy scanned in chart (See row information)  Would patient like information on creating a medical advance directive? -     Chief Complaint  Patient presents with  . Medical Management of Chronic Issues    3 month follow up. Still having some elevated blood pressure reading.    HPI: Patient is a 79 y.o. female seen today for an acute visit for Hypertension and Anxiety  Patient has h/o Hypothyroidism, Scoliosis, Hyperlipidemia, PAD with H/o Right ICA stenosis Also has h/o PAF one episode in 2012 has stayed in Sinus rhythm , LE edema  Hypertension Not Controlled  Patient is doing exercise and her weight is stable She thinks it is related to her Anxiety. She says she gets better BP at home. Still does not want to start any new Meds Anxiety  Did not like to start Lexapro due to side effects Wants to try Lorazepam PRN She just lost her Brother inlaw and is going through adjustment of moving to [facility] Hypothyroid Had detail work up with Endocrinologist Doing well Hyperlipidemia Refuses Statin Wants to continue Diet and Exercise Wants to discuss MOST Form  Past Medical History:  Diagnosis Date  . Atrial fibrillation ([place])    echo 12/23/10 EF= >55%, stress myoview 01/30/11 normal pattern of perfusion in all myocardial regions  . Carotid artery stenosis    Per [facility] new patient packet   . Chicken pox   . Colon polyp   . Colon polyp   . Dyslipidemia   . Heart murmur   . Hiatal hernia   . Hypothyroidism   . Laryngopharyngeal reflux   . Osteopenia   . Scoliosis    . Seasonal allergies   . Thyroid disease   . Vitamin D deficiency     Past Surgical History:  Procedure Laterality Date  . BREAST CYST EXCISION Right 1988  . ESOPHAGEAL MANOMETRY N/A 08/29/2015   Procedure: ESOPHAGEAL MANOMETRY (EM);  Surgeon: Manus Gunning, MD;  Location: [facility];  Service: Gastroenterology;  Laterality: N/A;  . EYE SURGERY     Cataract eye surgery-right 06/2014, left 04/2014  . TONSILLECTOMY     Childhood    Allergies  Allergen Reactions  . Amoxicillin Swelling  . Flonase [Fluticasone Propionate] Swelling    Swelling of throat per patient Swelling of throat per patient  . Sulfa Antibiotics Other (See Comments)    Upset stomach  . Fluticasone Propionate Swelling    Swelling of throat per patient  . Sulfasalazine Other (See Comments)    Upset stomach    Outpatient Encounter Medications as of 10/07/2019  Medication Sig  . aspirin 81 MG tablet Take 81 mg by mouth daily.  . Cholecalciferol (VITAMIN D-3) 1000 UNITS CAPS Take 1,000 Units by mouth 2 (two) times daily.   [name] Kitchen diltiazem (CARTIA XT) 120 MG 24 hr capsule Take 1 capsule (120 mg total) by mouth at bedtime.  . irbesartan (AVAPRO) 300 MG tablet Take 1 tablet (300 mg total) by mouth daily.  [name] Kitchen levothyroxine (SYNTHROID) 100 MCG tablet Take 100 mcg by mouth daily before  breakfast.  . Multiple Vitamin (MULTIVITAMIN) capsule Take 1 capsule by mouth daily.  . Multiple Vitamins-Minerals (PRESERVISION AREDS PO) Take 1 tablet by mouth daily.  . [DISCONTINUED] escitalopram (LEXAPRO) 5 MG tablet Take 1 tablet (5 mg total) by mouth daily.   No facility-administered encounter medications on file as of 10/07/2019.    Review of Systems:  Review of Systems  Review of Systems  Constitutional: Negative for activity change, appetite change, chills, diaphoresis, fatigue and fever.  HENT: Negative for mouth sores, postnasal drip, rhinorrhea, sinus pain and sore throat.   Respiratory: Negative for apnea, cough,  chest tightness, shortness of breath and wheezing.   Cardiovascular: Negative for chest pain, palpitations and leg swelling.  Gastrointestinal: Negative for abdominal distention, abdominal pain, constipation, diarrhea, nausea and vomiting.  Genitourinary: Negative for dysuria and frequency.  Musculoskeletal: Negative for arthralgias, joint swelling and myalgias.  Skin: Negative for rash.  Neurological: Negative for dizziness, syncope, weakness, light-headedness and numbness.  Psychiatric/Behavioral: Negative for behavioral problems, confusion and sleep disturbance.  Some anxiety due to loss of family member   Health Maintenance  Topic Date Due  . PNA vac Low Risk Adult (1 of 2 - PCV13) 10/06/2004  . DEXA SCAN  09/24/2018  . TETANUS/TDAP  06/06/2020  . INFLUENZA VACCINE  Completed    Physical Exam: Vitals:   10/07/19 0910  BP: (!) 180/100  Pulse: 69  Temp: 98 F (36.7 C)  SpO2: 97%  Weight: 129 lb 9.6 oz (58.8 kg)  Height: 5\' 6"  (1.676 m)   Body mass index is 20.92 kg/m. Physical Exam  Constitutional: Oriented to person, place, and time. Well-developed and well-nourished.  HENT:  Head: Normocephalic.  Mouth/Throat: Oropharynx is clear and moist.  Eyes: Pupils are equal, round, and reactive to light.  Neck: Neck supple.  Cardiovascular: Normal rate and normal heart sounds.  No murmur heard. Pulmonary/Chest: Effort normal and breath sounds normal. No respiratory distress. No wheezes. She has no rales.  Abdominal: Soft. Bowel sounds are normal. No distension. There is no tenderness. There is no rebound.  Musculoskeletal: No edema.  Lymphadenopathy: none Neurological: Alert and oriented to person, place, and time.  Skin: Skin is warm and dry.  Psychiatric: Normal mood and affect. Behavior is normal. Thought content normal.   Labs reviewed: Basic Metabolic Panel: Recent Labs    12/06/18 0937 03/17/19 0700 04/06/19 0000 05/04/19 1152  NA  --  137 136  --   K  --   3.8 3.8 4.1  CL  --  100 100  --   CO2  --  29 28  --   GLUCOSE  --  97 83  --   BUN  --  14 13  --   CREATININE  --  0.73 0.73  --   CALCIUM  --  9.8 9.4  --   TSH 0.51 1.63  --  3.39   Liver Function Tests: Recent Labs    03/17/19 0700 04/06/19 0000  AST 20 17  ALT 17 17  BILITOT 0.8 0.7  PROT 6.8 6.3   No results for input(s): LIPASE, AMYLASE in the last 8760 hours. No results for input(s): AMMONIA in the last 8760 hours. CBC: Recent Labs    03/17/19 0700  WBC 5.2  NEUTROABS 2,688  HGB 14.7  HCT 43.8  MCV 96.7  PLT 159   Lipid Panel: Recent Labs    12/06/18 0937 03/17/19 0700  CHOL 211* 271*  HDL 74.90 84  LDLCALC 123* 170*  TRIG 66.0 71  CHOLHDL 3 3.2   No results found for: HGBA1C  Procedures since last visit: No results found.  Assessment/Plan Essential hypertension I talked to the patient in detail We discussed her risk for Stroke and Heart Disease due to her high Blood Pressure. She has seen Cardiology before and Endocrinology. Patient thinks her BP is due to her stress. She has lost her Brother inlaw recently due to Covid She is going to try Ativan PRN for few weeks and see if it calms her and helps her BP She also will get her BP checked with her Facility Nurse Bring readings in few weeks to see me  Hypothyroidism TSH is Normal in 9/20  Paroxysmal atrial fibrillation ([place]) Follows with cardiology Has been stable on Cardizem Not on any anticoagulation  ACP (advance care planning) Discussed MOST form today and she wants to be DNR and No Feeding tube  Scoliosis Follows with a scoliosis specialist Osteoporosis, Has refused Treatment at this time  Depression Did not try Lexapro  Will try PRN ativan for few weeks Discussed side effects Hyperlipidemia Tried Statins and did not  tolerate it so does not want to try anything Wants to continue diet and exercise     Labs/tests ordered:  * No order type specified * Next appt:  Visit  date not found Total time spent in this patient care encounter was  45_  minutes; greater than 50% of the visit spent counseling patient and staff, reviewing records , Labs and coordinating care for problems addressed at this encounter.

## 2019-10-17 ENCOUNTER — Encounter: Payer: Self-pay | Admitting: Internal Medicine

## 2019-10-19 ENCOUNTER — Encounter: Payer: Self-pay | Admitting: Internal Medicine

## 2019-10-21 ENCOUNTER — Encounter: Payer: Self-pay | Admitting: Internal Medicine

## 2019-10-28 ENCOUNTER — Telehealth: Payer: Self-pay | Admitting: Interventional Cardiology

## 2019-10-28 NOTE — Telephone Encounter (Signed)
Left message for patient to call back  

## 2019-10-28 NOTE — Telephone Encounter (Signed)
Patient scheduled for 10/31/19 at 9:40 for a phone visit.

## 2019-10-28 NOTE — Telephone Encounter (Signed)
Patient scheduled for 3/29

## 2019-10-28 NOTE — Telephone Encounter (Signed)
Stacy [name] is returning Stacy [name]'s call in regards to the most recent patient message.

## 2019-10-30 NOTE — Progress Notes (Signed)
Virtual Visit via Telephone Note   This visit type was conducted due to national recommendations for restrictions regarding the COVID-19 Pandemic (e.g. social distancing) in an effort to limit this patient's exposure and mitigate transmission in our community.  Due to her co-morbid illnesses, this patient is at least at moderate risk for complications without adequate follow up.  This format is felt to be most appropriate for this patient at this time.  The patient did not have access to video technology/had technical difficulties with video requiring transitioning to audio format only (telephone).  All issues noted in this document were discussed and addressed.  No physical exam could be performed with this format.  Please refer to the patient's chart for her  consent to telehealth for [facility].   The patient was identified using 2 identifiers.  Date:  10/31/2019   ID:  Lawernce Keas, DOB Jul 17, 1941, MRN [id number]  Patient Location: Home Provider Location: Office  PCP:  Virgie Dad, MD  Cardiologist:  Larae Grooms, MD  Electrophysiologist:  None   Evaluation Performed:  Follow-Up Visit  Chief Complaint:  HTN  History of Present Illness:    Caylin Remsberg is a 79 y.o. female who had an episode of atrial fibrillation in 5/12. She spontaneously converted within the first 24 hours. She was on xarelto, and coumadin but decided to stop anticoagulation due to the need for testing. Her symptoms were very distinct.   SHe has had varicose veins. She did not have any pain. Mild swelling bilaterally, much improved after changing to Cartia XT instead of diltiazem.  Leg swelling better with compression stockings.She was diagnosed with a hiatal hernia and has occasional GERD. She took a PPI but this was stopped.  In May 2020, she moved to [facility] independent living. She has meals delivered and less contact with people from the outside. She switched to Dr. Lyndel Safe who  visits [facility].  Increased losartan to 50 mg in 03/2019 to help with BP. She feels that she is urinating more since increasing the medicine. BP has been better, but still high. Systolics down to the [id number], from the 200s. HR has been in the 62-78 range.   Had worsening insomnia as well. Wants to go back to the [facility]. She does wear a mask regularly.  In early 04/2019, plan was :"Increase Diltiazem to 240 mg daily. If she has better BPs, will call in 240 mg tabs. OK to increase exercise. If she goes back to the gym, she needs to wear a mask and socially distance."  BP initially improved with higher dilt dose, but then went back up to [id number] systolic.  HR slowed down on the higher dose of dilt so this was decreased back to 120 mg daily. Constipation has improved.   Over time, losartan was switched to telmisartan. THen telmisartan to irbesartan.   In 10/2019, the patient sent a message: "The irbesartan causes a weekly cycle of constipation and occasional diarrhea; no other side effects.  Re my blood pressure, it's hard to give a simple answer other than it hasn't been above the low-mid 160s.  With my new Omron monitor, I've been tracking it myself mornings and late afternoons for the past two weeks.  In the mornings, it ranges from the low 150s to the mid 130s and in the afternoons, from the mid 140s to the low-mid 130s on average, with occasional readings in the  120s.   However, it's only since restrictions here and in [place]  have been eased that I've seen the lower numbers, so I'm not sure how much to credit irbesartan.  That's why I wanted to have the phone visit."  Constipation occurred in the past prior to irbesartan.    She reaffirms the BP readings as noted above.  Readings are lower in afternoon, after she has been in the gym.   Exercising daily with something, at least walking.  Gym at facility may be opening.  Denies : Chest pain. Dizziness. Leg edema.  Nitroglycerin use. Orthopnea. Palpitations. Paroxysmal nocturnal dyspnea. Shortness of breath. Syncope.   The patient does not have symptoms concerning for COVID-19 infection (fever, chills, cough, or new shortness of breath).    Past Medical History:  Diagnosis Date  . Atrial fibrillation ([place])    echo 12/23/10 EF= >55%, stress myoview 01/30/11 normal pattern of perfusion in all myocardial regions  . Carotid artery stenosis    Per [facility] new patient packet   . Chicken pox   . Colon polyp   . Colon polyp   . Dyslipidemia   . Heart murmur   . Hiatal hernia   . Hypothyroidism   . Laryngopharyngeal reflux   . Osteopenia   . Scoliosis   . Seasonal allergies   . Thyroid disease   . Vitamin D deficiency    Past Surgical History:  Procedure Laterality Date  . BREAST CYST EXCISION Right 1988  . ESOPHAGEAL MANOMETRY N/A 08/29/2015   Procedure: ESOPHAGEAL MANOMETRY (EM);  Surgeon: Manus Gunning, MD;  Location: [facility];  Service: Gastroenterology;  Laterality: N/A;  . EYE SURGERY     Cataract eye surgery-right 06/2014, left 04/2014  . TONSILLECTOMY     Childhood     Current Meds  Medication Sig  . aspirin 81 MG tablet Take 81 mg by mouth daily.  . Cholecalciferol (VITAMIN D-3) 1000 UNITS CAPS Take 1,000 Units by mouth 2 (two) times daily.   [name] Kitchen diltiazem (CARTIA XT) 120 MG 24 hr capsule Take 1 capsule (120 mg total) by mouth at bedtime.  . irbesartan (AVAPRO) 300 MG tablet Take 1 tablet (300 mg total) by mouth daily.  [name] Kitchen levothyroxine (SYNTHROID) 100 MCG tablet Take 100 mcg by mouth daily before breakfast.  . LORazepam (ATIVAN) 0.5 MG tablet Take 1 tablet (0.5 mg total) by mouth at bedtime as needed for anxiety.  . Multiple Vitamin (MULTIVITAMIN) capsule Take 1 capsule by mouth daily.  . Multiple Vitamins-Minerals (PRESERVISION AREDS PO) Take 1 tablet by mouth daily.     Allergies:   Amoxicillin, Flonase [fluticasone propionate], Sulfa antibiotics, Fluticasone propionate, and  Sulfasalazine   Social History   Tobacco Use  . Smoking status: Never Smoker  . Smokeless tobacco: Never Used  Substance Use Topics  . Alcohol use: Yes    Alcohol/week: 1.0 standard drinks    Types: 1 Glasses of wine per week    Comment: 1 glass of wine 3x a week  . Drug use: No     Family Hx: The patient's family history includes Anuerysm in her father; CVA in her mother; Hyperlipidemia in her mother; Hypertension in her maternal grandmother, mother, and sister; Scoliosis in her sister; Stroke in her maternal grandmother. There is no history of Colon cancer, Heart attack, or Breast cancer.  ROS:   Please see the history of present illness.    More constipation of late- affects her sleep All other systems reviewed and are negative.   Prior CV studies:   The following studies were reviewed today:  Home readings reviewed  Labs/Other Tests and Data Reviewed:    EKG:  An ECG dated 05/2019 was personally reviewed today and demonstrated:  sinus bradycardia, no ST changes  Recent Labs: 03/17/2019: Hemoglobin 14.7; Platelets 159 04/06/2019: ALT 17; BUN 13; Creat 0.73; Sodium 136 05/04/2019: Potassium 4.1; TSH 3.39   Recent Lipid Panel Lab Results  Component Value Date/Time   CHOL 271 (H) 03/17/2019 07:00 AM   TRIG 71 03/17/2019 07:00 AM   HDL 84 03/17/2019 07:00 AM   CHOLHDL 3.2 03/17/2019 07:00 AM   LDLCALC 170 (H) 03/17/2019 07:00 AM   LDLDIRECT 148.1 08/17/2013 11:43 AM    Wt Readings from Last 3 Encounters:  10/31/19 127 lb (57.6 kg)  10/07/19 129 lb 9.6 oz (58.8 kg)  07/08/19 129 lb (58.5 kg)     Objective:    Vital Signs:  BP (!) 149/89   Pulse 65   Ht 5\' 6"  (1.676 m)   Wt 127 lb (57.6 kg)   BMI 20.50 kg/m    VITAL SIGNS:  reviewed GEN:  no acute distress RESPIRATORY:  no shortness of breath NEURO:  alert and oriented x 3, no obvious focal deficit PSYCH:  normal affect exam limited by phone format  ASSESSMENT & PLAN:    1. PAF: No sx of AFib.    2. HTN: OK to try irbesartan 150 mg daily since BP readings are better controlled. See if constipation improves.  If BP increases, go back to 300 mg irbesartan.  Has an Omron machine at home.  3. Varicose veins: stable.  Elevate legs. 4. Mild AI: No CHF sx.  Exercising well.  5. Carotid disease: followed with [facility]. 6. Hyperlipidemia: High HDL. LDL 170 in 03/2019  COVID-19 Education: The signs and symptoms of COVID-19 were discussed with the patient and how to seek care for testing (follow up with PCP or arrange E-visit).  The importance of social distancing was discussed today.  Time:   Today, I have spent 15 minutes with the patient with telehealth technology discussing the above problems.     Medication Adjustments/Labs and Tests Ordered: Current medicines are reviewed at length with the patient today.  Concerns regarding medicines are outlined above.   Tests Ordered: No orders of the defined types were placed in this encounter.   Medication Changes: No orders of the defined types were placed in this encounter.   Follow Up:  Either In Person or Virtual in 6 month(s)  Signed, Larae Grooms, MD  10/31/2019 10:05 AM    [place]

## 2019-10-31 ENCOUNTER — Other Ambulatory Visit: Payer: Self-pay

## 2019-10-31 ENCOUNTER — Telehealth (INDEPENDENT_AMBULATORY_CARE_PROVIDER_SITE_OTHER): Payer: Medicare HMO | Admitting: Interventional Cardiology

## 2019-10-31 ENCOUNTER — Encounter: Payer: Self-pay | Admitting: Interventional Cardiology

## 2019-10-31 VITALS — BP 149/89 | HR 65 | Ht 66.0 in | Wt 127.0 lb

## 2019-10-31 DIAGNOSIS — I480 Paroxysmal atrial fibrillation: Secondary | ICD-10-CM | POA: Diagnosis not present

## 2019-10-31 DIAGNOSIS — I10 Essential (primary) hypertension: Secondary | ICD-10-CM

## 2019-10-31 DIAGNOSIS — I351 Nonrheumatic aortic (valve) insufficiency: Secondary | ICD-10-CM | POA: Diagnosis not present

## 2019-10-31 MED ORDER — IRBESARTAN 150 MG PO TABS: 150.0000 mg | ORAL_TABLET | Freq: Every day | ORAL | 3 refills | Status: DC

## 2019-10-31 NOTE — Patient Instructions (Signed)
Medication Instructions:  Your physician has recommended you make the following change in your medication:   DECREASE: irbesartan to 150 mg once a day  *If you need a refill on your cardiac medications before your next appointment, please call your pharmacy*   Lab Work: None ordered  If you have labs (blood work) drawn today and your tests are completely normal, you will receive your results only by: [name] Kitchen MyChart Message (if you have MyChart) OR . A paper copy in the mail If you have any lab test that is abnormal or we need to change your treatment, we will call you to review the results.   Testing/Procedures: None ordered   Follow-Up: At [facility], you and your health needs are our priority.  As part of our continuing mission to provide you with exceptional heart care, we have created designated Provider Care Teams.  These Care Teams include your primary Cardiologist (physician) and Advanced Practice Providers (APPs -  Physician Assistants and Nurse Practitioners) who all work together to provide you with the care you need, when you need it.  We recommend signing up for the patient portal called "MyChart".  Sign up information is provided on this After Visit Summary.  MyChart is used to connect with patients for Virtual Visits (Telemedicine).  Patients are able to view lab/test results, encounter notes, upcoming appointments, etc.  Non-urgent messages can be sent to your provider as well.   To learn more about what you can do with MyChart, go to [email or web address].    Your next appointment:   6 month(s)  The format for your next appointment:   In Person  Provider:   You may see Larae Grooms, MD or one of the following Advanced Practice Providers on your designated Care Team:    Melina Copa, PA-C  Ermalinda Barrios, PA-C    Other Instructions

## 2019-11-01 ENCOUNTER — Telehealth: Payer: Self-pay | Admitting: Interventional Cardiology

## 2019-11-01 MED ORDER — IRBESARTAN 300 MG PO TABS: 300.0000 mg | ORAL_TABLET | Freq: Every day | ORAL | 0 refills | Status: DC

## 2019-11-01 MED ORDER — HYDROCHLOROTHIAZIDE 25 MG PO TABS: 25.0000 mg | ORAL_TABLET | Freq: Every day | ORAL | 3 refills | Status: DC

## 2019-11-01 NOTE — Telephone Encounter (Signed)
She never made change to lower dose.  She took irbesartan 300 mg yesterday prior to telemedicine visit and 300 mg this AM since BP was up last night.

## 2019-11-01 NOTE — Telephone Encounter (Signed)
Yesterday afternoon and continue today BP is up in High levels again     Pt c/o BP issue: STAT if pt c/o blurred vision, one-sided weakness or slurred speech  1. What are your last 5 BP readings? Today 181/101   Last night 161/93 hr 75   2. Are you having any other symptoms (ex. Dizziness, headache, blurred vision, passed out)? No symptoms   3. What is your BP issue? Yesterday afternoon she noticed her BP was starting to get high so she had a resident at her nursing home come check her BP and it was 161/93 and  today BP is up in High levels again    Please call

## 2019-11-01 NOTE — Telephone Encounter (Signed)
I spoke with patient and told her Dr Curt Bears recommends she continue Irbesartan 300 mg daily and start HCTZ 25 mg daily. She is concerned about taking diuretic and is not sure she is going to take it. I told her I would send prescription to [facility] so she would have available and I encouraged her to start it due to elevated BP readings. Patient does not want new prescription for 300 mg dose of irbesartan sent to pharmacy at this time. Will update med list Patient reports she does not know why BP has suddenly become so elevated. She feels some of it could be anxiety related.  She will take Lorazepam as prescribed. I attempted to schedule appointment next week with PA but patient requests appointment with Dr Irish Lack.  Will ask [place] to assist with this. Patient also requests I make Dr Christ Kick aware of BP and recommendations.

## 2019-11-01 NOTE — Telephone Encounter (Signed)
Start HCTZ 25 mg and follow up with varanasi or his app at next available.

## 2019-11-01 NOTE — Telephone Encounter (Signed)
I spoke with patient. She reports for last 3-4 weeks her BP has been good. Had telemedicine visit with Dr Irish Lack yesterday and Irbesartan was decreased from 300 mg daily to 150 mg daily. She took 300 mg yesterday prior to her telemedicine visit. Last night her BP was 161/93.  She was feeling anxious and took Lorazepam at that time. Today she took Irbesartan 300 mg around 9:30 since BP was elevated last night. She went to a very stressful meeting from 10-11 this morning. She checked BP at 11:30 after meeting and it was 177/104 and then 181/101.   She states she was not having any issues until last night with BP. She rechecked while on phone with me and current BP is 198/113.  She is feeling fine except for elevated BP.  No headache or other symptoms. Will review with provider.

## 2019-11-01 NOTE — Telephone Encounter (Signed)
Per Varanasi's clinic note, increase irbesartan back to prior dose of 300 mg.

## 2019-11-01 NOTE — Telephone Encounter (Signed)
Called and spoke to patient. Offered virtual appointment with Dr. Irish Lack next week and she declined. She states that she believes that her HTN is related to constipation, stress, and anxiety. She states that she will continue to take her irbesartan 300 mg QD as she never decreased it and change her diet. Patient wants to wait a few days to to see if her BP comes down before starting the HCTZ. Explained the importance of having good BP control and risks for it being elevated like that. She verbalized understanding and states that she will definitely call if it does not normalize.

## 2019-11-11 NOTE — Telephone Encounter (Signed)
Left message for patient to call back  

## 2019-11-14 ENCOUNTER — Telehealth: Payer: Self-pay | Admitting: Interventional Cardiology

## 2019-11-14 NOTE — Telephone Encounter (Signed)
Patient returning call in regards to her mychart message.

## 2019-11-14 NOTE — Telephone Encounter (Signed)
Left message to call back  

## 2019-11-14 NOTE — Telephone Encounter (Signed)
Spoke with patient.  See MyChart message.

## 2019-11-14 NOTE — Telephone Encounter (Signed)
Spoke to patient and made her aware of recommendations below. She states that she does not want to start a diuretic at this time. She states that her BPs have decreased and SBPs are now in the 130s. She states that she feels like her spikes are related to anxiety, stress with recent changes around the facility that she lives at, and constipation. I have instructed the patient to try a stool softener such as colace. Instructed the patient to let us know if she continues to have issues with her BP and we can look at getting her set up in the HTN Clinic. She verbalized understanding and thanked me for the call.

## 2019-11-18 ENCOUNTER — Encounter: Payer: Self-pay | Admitting: Internal Medicine

## 2019-12-02 ENCOUNTER — Encounter: Payer: Self-pay | Admitting: Internal Medicine

## 2019-12-06 ENCOUNTER — Telehealth: Payer: Self-pay | Admitting: Interventional Cardiology

## 2019-12-06 DIAGNOSIS — I10 Essential (primary) hypertension: Secondary | ICD-10-CM

## 2019-12-06 NOTE — Telephone Encounter (Signed)
New Message:    Pt said she had talked to Stacy [name] a few ago, about enrolling in a special blood pressure program. Pt says she is definitely interested in the blood pressure program, she thinks it will be very beneficial.

## 2019-12-07 NOTE — Telephone Encounter (Signed)
Called and spoke to the patient. She states that she continues to have spikes in her BP. She states that her SBP is normally in the the 130s-150s. She states that her SBP gets as high as 170s. She states that she is ready to be seen in the HTN Clinic. Appointment made on 5/18.

## 2019-12-20 ENCOUNTER — Other Ambulatory Visit: Payer: Self-pay

## 2019-12-20 ENCOUNTER — Ambulatory Visit (INDEPENDENT_AMBULATORY_CARE_PROVIDER_SITE_OTHER): Payer: Medicare HMO | Admitting: Pharmacist

## 2019-12-20 VITALS — BP 162/90 | HR 88

## 2019-12-20 DIAGNOSIS — I10 Essential (primary) hypertension: Secondary | ICD-10-CM

## 2019-12-20 MED ORDER — IRBESARTAN 300 MG PO TABS
150.0000 mg | ORAL_TABLET | Freq: Every day | ORAL | 0 refills | Status: DC
Start: 2019-12-20 — End: 2022-07-24

## 2019-12-20 NOTE — Progress Notes (Signed)
Patient ID: Stacy [name]                 DOB: 1941/06/19                      MRN: [id number]     HPI: Stacy [name] is a 79 y.o. female referred by Dr. Irish Lack to HTN clinic. PMH is significant for PAF, HTN, carotid disease and HLD. She has sent several messages to Dr. Ed Blalock about her blood pressure. Believes her blood pressure issues are related to constipation, stress and anxiety. Had not been interested in diuretic in the past due to frequent urination, some night she gets up every few hours to urinate.  Patient presents today for blood pressure follow up. She is upset because her blood pressure has always been good. In a span of 2 months her blood pressure spiked. She thinks its due to stress and anxiety. About a year ago she sold her house and moved into independent living at [facility]. It has been a big adjustment for her. She does not have control over her meals, she's on more of a schedule. She moved in during the pandemic so she could not socialize or meet the people there. She states that her blood pressure have improved since she has been able to socialize in the last few weeks. Not convinced the irbesartan really does anything. Thinks its causing her constipation. She is very active and exercises/walks almost every day. She tries to pick health options in the dinning room, but she is somewhat limited.  She did bring her home BP cuff which was found to be relatively accurate (diastolic was a little higher on the home cuff). It is an [employer] brand. Clinic reading 162/90 Home cuff 168/101. Blood pressure dropped to 150/82 after about another 5 min. Demonstrated proper technique. She checks in AM before meds (has been running [id number] systolic over A999333) and after noon (120-130/80's). Today she got a reading of 121/77.  She denies dizziness, lightheadedness, headache, blurred vision, SOB or swelling. She really believes her blood pressure is elevated due to stress and anxiety. Most likely  has a component of white coat syndrome. She wants to decrease irbesartan to 150mg  to see if it helps constipation.  Current HTN meds: diltiazem 120mg  daily, irbesartan 300mg  daily,  Previously tried: losartan (changed to telmisartan) telmisartan (changed to irbesartan) BP goal: <130/80  Family History: The patient's family history includes Anuerysm in her father; CVA in her mother; Hyperlipidemia in her mother; Hypertension in her maternal grandmother, mother, and sister; Scoliosis in her sister; Stroke in her maternal grandmother. There is no history of Colon cancer, Heart attack, or Breast cancer  Social History: never smoked, 1 glass of wine/week  Diet[facility] at [facility], 1 cup of tea in AM  Exercise: 3 times a week- goes to the gym for 1-1.5hr, alternate days she walks 1-2 miles (about an hour)  Home BP readings: high 120-mid 140's some in the 150's/80-90 121/77 (yesterday- probably the best reading) 130's-140's/80's-90 in the AM (before meds) and 120's in the afternoon.  Wt Readings from Last 3 Encounters:  10/31/19 127 lb (57.6 kg)  10/07/19 129 lb 9.6 oz (58.8 kg)  07/08/19 129 lb (58.5 kg)   BP Readings from Last 3 Encounters:  10/31/19 (!) 149/89  10/07/19 (!) 196/100  07/08/19 (!) 178/90   Pulse Readings from Last 3 Encounters:  10/31/19 65  10/07/19 69  07/08/19 78  Renal function: CrCl cannot be calculated (Patient's most recent lab result is older than the maximum 21 days allowed.).  Past Medical History:  Diagnosis Date  . Atrial fibrillation ([place])    echo 12/23/10 EF= >55%, stress myoview 01/30/11 normal pattern of perfusion in all myocardial regions  . Carotid artery stenosis    Per [facility] new patient packet   . Chicken pox   . Colon polyp   . Colon polyp   . Dyslipidemia   . Heart murmur   . Hiatal hernia   . Hypothyroidism   . Laryngopharyngeal reflux   . Osteopenia   . Scoliosis   . Seasonal allergies   . Thyroid disease   .  Vitamin D deficiency     Current Outpatient Medications on File Prior to Visit  Medication Sig Dispense Refill  . aspirin 81 MG tablet Take 81 mg by mouth daily.    . Cholecalciferol (VITAMIN D-3) 1000 UNITS CAPS Take 1,000 Units by mouth 2 (two) times daily.     [name] Kitchen diltiazem (CARTIA XT) 120 MG 24 hr capsule Take 1 capsule (120 mg total) by mouth at bedtime. 90 capsule 3  . hydrochlorothiazide (HYDRODIURIL) 25 MG tablet Take 1 tablet (25 mg total) by mouth daily. 30 tablet 3  . irbesartan (AVAPRO) 300 MG tablet Take 1 tablet (300 mg total) by mouth daily. 90 tablet 0  . levothyroxine (SYNTHROID) 100 MCG tablet Take 100 mcg by mouth daily before breakfast.    . LORazepam (ATIVAN) 0.5 MG tablet Take 1 tablet (0.5 mg total) by mouth at bedtime as needed for anxiety. 30 tablet 0  . Multiple Vitamin (MULTIVITAMIN) capsule Take 1 capsule by mouth daily.    . Multiple Vitamins-Minerals (PRESERVISION AREDS PO) Take 1 tablet by mouth daily.     No current facility-administered medications on file prior to visit.    Allergies  Allergen Reactions  . Amoxicillin Swelling  . Flonase [Fluticasone Propionate] Swelling    Swelling of throat per patient Swelling of throat per patient  . Sulfa Antibiotics Other (See Comments)    Upset stomach  . Fluticasone Propionate Swelling    Swelling of throat per patient  . Sulfasalazine Other (See Comments)    Upset stomach    There were no vitals taken for this visit.   Assessment/Plan:  1. Hypertension - Blood pressure is above goal of <130/80 in clinic today. Patient really wants to decrease irbesartan to 150mg . I have agreed to let her trial this. Although I still believe constipation is more due to either diltiazem or diet changes. We did discuss adding another medication as blood pressures even at home are not at goal. Patient really does not want to add another medication. She also states that Dr. Hassell Done nurse said that as long as it was in the  130-140's that her blood pressure was fine. I will verify what blood pressure goal Dr. Irish Lack wants. Since patient's issues do seem to be stemming for stress and anxiety, I did recommend she look into RESPeRATE. I did warn that there was limited evidence (small trials) that it works and patients in these trials were also on blood pressure medications. Also warned that it is relatively expensive, but that if she would like to look into it, wouldn't be a bad idea.  We did discuss multiple reasons blood pressure could be high- her secondary workup was negative. Could be stress related, or age related/hormone related. I will call patient in 2 weeks to follow up  on how her BP is doing.   Thank you  Ramond Dial, Pharm.D, BCPS, CPP [place]  [id number], [place], [place] [address]  Phone: [phone]; Fax: [phone]

## 2019-12-20 NOTE — Patient Instructions (Signed)
It was a pleasure to meet you today  You can decrease your irbesartan to 150mg  to see if it helps with constiptation. If your blood pressure spikes again, you will need to resume the 300mg  dose. Continue checking your blood pressure daily I will call you in 2 weeks to see how it is going  Can research resperate [email or web address] or [employer] [place] at [phone] with any questions or concerns

## 2019-12-21 DIAGNOSIS — R69 Illness, unspecified: Secondary | ICD-10-CM | POA: Diagnosis not present

## 2019-12-22 NOTE — Progress Notes (Signed)
Given her age and issues with meds, I would like to see BP < 150/90.  JV

## 2019-12-30 DIAGNOSIS — H52203 Unspecified astigmatism, bilateral: Secondary | ICD-10-CM | POA: Diagnosis not present

## 2019-12-30 DIAGNOSIS — H26492 Other secondary cataract, left eye: Secondary | ICD-10-CM | POA: Diagnosis not present

## 2020-01-03 ENCOUNTER — Other Ambulatory Visit: Payer: Self-pay | Admitting: Family Medicine

## 2020-01-04 ENCOUNTER — Telehealth: Payer: Self-pay | Admitting: Nurse Practitioner

## 2020-01-04 ENCOUNTER — Telehealth: Payer: Self-pay | Admitting: Pharmacist

## 2020-01-04 NOTE — Telephone Encounter (Signed)
pt requesting a refill she is low medication  levothyroxine (SYNTHROID) 100 MCG tablet  [place], [place] [id number]  Phone:  [phone] Fax:  [phone]

## 2020-01-04 NOTE — Telephone Encounter (Signed)
Called patient as a follow up to pharmD appointment on 5/18. At that visit irbesatan was decreased to 150mg  daily per patient request. Patient states that her blood pressures have been 130-140's/ <90. Overall her constipation is better. Maybe 1 day a week she has an issue. Per Dr. Valentino Hue for BP goal to be <150/90. Therefore will continue irbesartan 150mg  daily and diltiazem 120mg  daily. Follow up only as needed.

## 2020-01-05 ENCOUNTER — Other Ambulatory Visit: Payer: Self-pay | Admitting: Family Medicine

## 2020-01-05 NOTE — Telephone Encounter (Signed)
Pt is calling in stating that she does not want [facility] to send in the Rx for levothyroxine all matters has been resolved per the pt.

## 2020-01-09 ENCOUNTER — Other Ambulatory Visit: Payer: Self-pay | Admitting: *Deleted

## 2020-01-09 MED ORDER — LEVOTHYROXINE SODIUM 100 MCG PO TABS: 100.0000 ug | ORAL_TABLET | Freq: Every day | ORAL | 1 refills | Status: DC

## 2020-01-09 NOTE — Telephone Encounter (Signed)
Per telephone message on 01/05/2020 "Patient called in stating that she does not want [facility] to send in Rx for levothyroxine all matters has been resolved per the pt

## 2020-01-09 NOTE — Telephone Encounter (Signed)
Patient requested refill

## 2020-01-09 NOTE — Telephone Encounter (Signed)
Per telephone encounter note from 01/05/2020: "Pt is calling in stating that she does not want [facility] to send in the Rx for levothyroxine all matters has been resolved per the pt."

## 2020-01-10 DIAGNOSIS — Z79899 Other long term (current) drug therapy: Secondary | ICD-10-CM | POA: Diagnosis not present

## 2020-01-10 DIAGNOSIS — I351 Nonrheumatic aortic (valve) insufficiency: Secondary | ICD-10-CM | POA: Diagnosis not present

## 2020-01-10 DIAGNOSIS — I482 Chronic atrial fibrillation, unspecified: Secondary | ICD-10-CM | POA: Diagnosis not present

## 2020-01-10 DIAGNOSIS — E039 Hypothyroidism, unspecified: Secondary | ICD-10-CM | POA: Diagnosis not present

## 2020-01-11 NOTE — Telephone Encounter (Signed)
Opened in error

## 2020-01-19 DIAGNOSIS — H26492 Other secondary cataract, left eye: Secondary | ICD-10-CM | POA: Diagnosis not present

## 2020-02-03 ENCOUNTER — Telehealth: Payer: Self-pay

## 2020-02-03 NOTE — Telephone Encounter (Signed)
Patient returned call, scheduled for 02/09/2020

## 2020-02-03 NOTE — Telephone Encounter (Signed)
Called patient (to see if she would be interested in getting scheduled for an annual wellness visit with [facility]). No answer. LMOM to return call.

## 2020-02-08 ENCOUNTER — Telehealth: Payer: Self-pay | Admitting: Interventional Cardiology

## 2020-02-08 NOTE — Telephone Encounter (Signed)
New Message  Pt c/o BP issue: STAT if pt c/o blurred vision, one-sided weakness or slurred speech  1. What are your last 5 BP readings? 129/88, 137/84, 179/(cant remember the bottom number)  2. Are you having any other symptoms (ex. Dizziness, headache, blurred vision, passed out)? No  3. What is your BP issue? Bp spiking

## 2020-02-08 NOTE — Telephone Encounter (Signed)
Patient returned my call. She states that she wants to speak with [place] and does not feel I am the right person to speak with. She was told Burman Riis would call her back right away.  I will forward message to Brittnay to call pt back.

## 2020-02-08 NOTE — Telephone Encounter (Signed)
Returned call to patient. She states that her SBP has been doing great on the lower dose of irbesartan 150 mg QD, typically running around 120-130s. She states that yesterday she was in the 140s and today she was 179/88. Patient is completely asymptomatic. Made patient aware that we do not typically make a change for 1 elevation. Made patient aware that her BP goal is <150/90. Instructed her to continue to monitor and let us know if she is consistently elevated. Patient is in agreement with this plan.

## 2020-02-09 ENCOUNTER — Non-Acute Institutional Stay: Payer: Medicare HMO | Admitting: Nurse Practitioner

## 2020-02-09 ENCOUNTER — Encounter: Payer: Self-pay | Admitting: Nurse Practitioner

## 2020-02-09 ENCOUNTER — Other Ambulatory Visit: Payer: Self-pay

## 2020-02-09 VITALS — BP 184/100 | HR 88 | Temp 98.2°F | Ht 66.0 in | Wt 129.0 lb

## 2020-02-09 DIAGNOSIS — Z0000 Encounter for general adult medical examination without abnormal findings: Secondary | ICD-10-CM

## 2020-02-09 DIAGNOSIS — E039 Hypothyroidism, unspecified: Secondary | ICD-10-CM | POA: Diagnosis not present

## 2020-02-09 DIAGNOSIS — F339 Major depressive disorder, recurrent, unspecified: Secondary | ICD-10-CM

## 2020-02-09 DIAGNOSIS — R69 Illness, unspecified: Secondary | ICD-10-CM | POA: Diagnosis not present

## 2020-02-09 MED ORDER — LORAZEPAM 0.5 MG PO TABS: 0.5000 mg | ORAL_TABLET | Freq: Every evening | ORAL | 0 refills | Status: DC | PRN

## 2020-02-09 MED ORDER — TETANUS-DIPHTH-ACELL PERTUSSIS 5-2.5-18.5 LF-MCG/0.5 IM SUSP: 0.5000 mL | Freq: Once | INTRAMUSCULAR | 0 refills | Status: AC

## 2020-02-09 NOTE — Patient Instructions (Addendum)
Due for Tdap.   Stacy [name] , Thank you for taking time to come for your Medicare Wellness Visit. I appreciate your ongoing commitment to your health goals. Please review the following plan we discussed and let me know if I can assist you in the future.   Screening recommendations/referrals: Colonoscopy aged out Mammogram aged out Bone Density declined Recommended yearly ophthalmology/optometry visit for glaucoma screening and checkup Recommended yearly dental visit for hygiene and checkup  Vaccinations: Influenza vaccine up to date Pneumococcal vaccine up to date Tdap vaccine due Shingles vaccine up to date, declined Shingrix.     Advanced directives: up to date  Conditions/risks identified: yes  Next appointment: one year   Preventive Care 80 Years and Older, Female Preventive care refers to lifestyle choices and visits with your health care provider that can promote health and wellness. What does preventive care include?  A yearly physical exam. This is also called an annual well check.  Dental exams once or twice a year.  Routine eye exams. Ask your health care provider how often you should have your eyes checked.  Personal lifestyle choices, including:  Daily care of your teeth and gums.  Regular physical activity.  Eating a healthy diet.  Avoiding tobacco and drug use.  Limiting alcohol use.  Practicing safe sex.  Taking low-dose aspirin every day.  Taking vitamin and mineral supplements as recommended by your health care provider. What happens during an annual well check? The services and screenings done by your health care provider during your annual well check will depend on your age, overall health, lifestyle risk factors, and family history of disease. Counseling  Your health care provider may ask you questions about your:  Alcohol use.  Tobacco use.  Drug use.  Emotional well-being.  Home and relationship well-being.  Sexual  activity.  Eating habits.  History of falls.  Memory and ability to understand (cognition).  Work and work Statistician.  Reproductive health. Screening  You may have the following tests or measurements:  Height, weight, and BMI.  Blood pressure.  Lipid and cholesterol levels. These may be checked every 5 years, or more frequently if you are over 39 years old.  Skin check.  Lung cancer screening. You may have this screening every year starting at age 80 if you have a 30-pack-year history of smoking and currently smoke or have quit within the past 15 years.  Fecal occult blood test (FOBT) of the stool. You may have this test every year starting at age 80.  Flexible sigmoidoscopy or colonoscopy. You may have a sigmoidoscopy every 5 years or a colonoscopy every 10 years starting at age 80.  Hepatitis C blood test.  Hepatitis B blood test.  Sexually transmitted disease (STD) testing.  Diabetes screening. This is done by checking your blood sugar (glucose) after you have not eaten for a while (fasting). You may have this done every 1-3 years.  Bone density scan. This is done to screen for osteoporosis. You may have this done starting at age 55.  Mammogram. This may be done every 1-2 years. Talk to your health care provider about how often you should have regular mammograms. Talk with your health care provider about your test results, treatment options, and if necessary, the need for more tests. Vaccines  Your health care provider may recommend certain vaccines, such as:  Influenza vaccine. This is recommended every year.  Tetanus, diphtheria, and acellular pertussis (Tdap, Td) vaccine. You may need a Td booster every  10 years.  Zoster vaccine. You may need this after age 80.  Pneumococcal 13-valent conjugate (PCV13) vaccine. One dose is recommended after age 65.  Pneumococcal polysaccharide (PPSV23) vaccine. One dose is recommended after age 30. Talk to your health care  provider about which screenings and vaccines you need and how often you need them. This information is not intended to replace advice given to you by your health care provider. Make sure you discuss any questions you have with your health care provider. Document Released: 08/17/2015 Document Revised: 04/09/2016 Document Reviewed: 05/22/2015 Elsevier Interactive Patient Education  2017 [place] Prevention in the Home Falls can cause injuries. They can happen to people of all ages. There are many things you can do to make your home safe and to help prevent falls. What can I do on the outside of my home?  Regularly fix the edges of walkways and driveways and fix any cracks.  Remove anything that might make you trip as you walk through a door, such as a raised step or threshold.  Trim any bushes or trees on the path to your home.  Use bright outdoor lighting.  Clear any walking paths of anything that might make someone trip, such as rocks or tools.  Regularly check to see if handrails are loose or broken. Make sure that both sides of any steps have handrails.  Any raised decks and porches should have guardrails on the edges.  Have any leaves, snow, or ice cleared regularly.  Use sand or salt on walking paths during winter.  Clean up any spills in your garage right away. This includes oil or grease spills. What can I do in the bathroom?  Use night lights.  Install grab bars by the toilet and in the tub and shower. Do not use towel bars as grab bars.  Use non-skid mats or decals in the tub or shower.  If you need to sit down in the shower, use a plastic, non-slip stool.  Keep the floor dry. Clean up any water that spills on the floor as soon as it happens.  Remove soap buildup in the tub or shower regularly.  Attach bath mats securely with double-sided non-slip rug tape.  Do not have throw rugs and other things on the floor that can make you trip. What can I do in  the bedroom?  Use night lights.  Make sure that you have a light by your bed that is easy to reach.  Do not use any sheets or blankets that are too big for your bed. They should not hang down onto the floor.  Have a firm chair that has side arms. You can use this for support while you get dressed.  Do not have throw rugs and other things on the floor that can make you trip. What can I do in the kitchen?  Clean up any spills right away.  Avoid walking on wet floors.  Keep items that you use a lot in easy-to-reach places.  If you need to reach something above you, use a strong step stool that has a grab bar.  Keep electrical cords out of the way.  Do not use floor polish or wax that makes floors slippery. If you must use wax, use non-skid floor wax.  Do not have throw rugs and other things on the floor that can make you trip. What can I do with my stairs?  Do not leave any items on the stairs.  Make sure  that there are handrails on both sides of the stairs and use them. Fix handrails that are broken or loose. Make sure that handrails are as long as the stairways.  Check any carpeting to make sure that it is firmly attached to the stairs. Fix any carpet that is loose or worn.  Avoid having throw rugs at the top or bottom of the stairs. If you do have throw rugs, attach them to the floor with carpet tape.  Make sure that you have a light switch at the top of the stairs and the bottom of the stairs. If you do not have them, ask someone to add them for you. What else can I do to help prevent falls?  Wear shoes that:  Do not have high heels.  Have rubber bottoms.  Are comfortable and fit you well.  Are closed at the toe. Do not wear sandals.  If you use a stepladder:  Make sure that it is fully opened. Do not climb a closed stepladder.  Make sure that both sides of the stepladder are locked into place.  Ask someone to hold it for you, if possible.  Clearly mark and  make sure that you can see:  Any grab bars or handrails.  First and last steps.  Where the edge of each step is.  Use tools that help you move around (mobility aids) if they are needed. These include:  Canes.  Walkers.  Scooters.  Crutches.  Turn on the lights when you go into a dark area. Replace any light bulbs as soon as they burn out.  Set up your furniture so you have a clear path. Avoid moving your furniture around.  If any of your floors are uneven, fix them.  If there are any pets around you, be aware of where they are.  Review your medicines with your doctor. Some medicines can make you feel dizzy. This can increase your chance of falling. Ask your doctor what other things that you can do to help prevent falls. This information is not intended to replace advice given to you by your health care provider. Make sure you discuss any questions you have with your health care provider. Document Released: 05/17/2009 Document Revised: 12/27/2015 Document Reviewed: 08/25/2014 Elsevier Interactive Patient Education  2017 [employer].  The patient stated her blood pressure has always been hight when she is the doctor's office.

## 2020-02-09 NOTE — Progress Notes (Signed)
Subjective:   Stacy [name] is a 79 y.o. female who presents for Medicare Annual (Subsequent) preventive examination at [facility].  Cardiac Risk Factors include: advanced age (>60men, >37 women);family history of premature cardiovascular disease;hypertension     Objective:    Today's Vitals   02/09/20 1334  BP: (!) 184/100  Pulse: 88  Temp: 98.2 F (36.8 C)  SpO2: 96%  Weight: 129 lb (58.5 kg)  Height: 5\' 6"  (1.676 m)   Body mass index is 20.82 kg/m.  Advanced Directives 05/06/2019 03/11/2019 02/25/2019 08/22/2015 08/15/2015 08/08/2015 04/07/2015  Does Patient Have a Medical Advance Directive? Yes Yes Yes Yes Yes Yes No  Type of [place] administrator Power of [place];Living will [place];Living will [place];Living will -  Does patient want to make changes to medical advance directive? No - Patient declined No - Patient declined No - Patient declined No - Patient declined - No - Patient declined -  Copy of [place] in Chart? Yes - validated most recent copy scanned in chart (See row information) Yes - validated most recent copy scanned in chart (See row information) Yes - validated most recent copy scanned in chart (See row information) Yes - No - copy requested -  Would patient like information on creating a medical advance directive? - - - - - - No - patient declined information    Current Medications (verified) Outpatient Encounter Medications as of 02/09/2020  Medication Sig  . aspirin 81 MG tablet Take 81 mg by mouth daily.  . Cholecalciferol (VITAMIN D-3) 1000 UNITS CAPS Take 1,000 Units by mouth 2 (two) times daily.   [name] Kitchen diltiazem (CARTIA XT) 120 MG 24 hr capsule Take 1 capsule (120 mg total) by mouth at bedtime.  . irbesartan (AVAPRO) 300 MG tablet Take 0.5 tablets (150 mg total) by mouth daily.  [name] Kitchen  levothyroxine (SYNTHROID) 100 MCG tablet Take 1 tablet (100 mcg total) by mouth daily before breakfast.  . LORazepam (ATIVAN) 0.5 MG tablet Take 1 tablet (0.5 mg total) by mouth at bedtime as needed for anxiety.  . Multiple Vitamin (MULTIVITAMIN) capsule Take 1 capsule by mouth daily.  . Multiple Vitamins-Minerals (PRESERVISION AREDS PO) Take 1 tablet by mouth daily.  . Tdap (BOOSTRIX) 5-2.5-18.5 LF-MCG/0.5 injection Inject 0.5 mLs into the muscle once for 1 dose.  . [DISCONTINUED] LORazepam (ATIVAN) 0.5 MG tablet Take 1 tablet (0.5 mg total) by mouth at bedtime as needed for anxiety.  . [DISCONTINUED] Tdap (BOOSTRIX) 5-2.5-18.5 LF-MCG/0.5 injection Inject 0.5 mLs into the muscle once.   No facility-administered encounter medications on file as of 02/09/2020.    Allergies (verified) Amoxicillin, Flonase [fluticasone propionate], Sulfa antibiotics, Fluticasone propionate, and Sulfasalazine   History: Past Medical History:  Diagnosis Date  . Atrial fibrillation ([place])    echo 12/23/10 EF= >55%, stress myoview 01/30/11 normal pattern of perfusion in all myocardial regions  . Carotid artery stenosis    Per [facility] new patient packet   . Chicken pox   . Colon polyp   . Colon polyp   . Dyslipidemia   . Heart murmur   . Hiatal hernia   . Hypothyroidism   . Laryngopharyngeal reflux   . Osteopenia   . Scoliosis   . Seasonal allergies   . Thyroid disease   . Vitamin D deficiency    Past Surgical History:  Procedure Laterality Date  . BREAST  CYST EXCISION Right 1988  . ESOPHAGEAL MANOMETRY N/A 08/29/2015   Procedure: ESOPHAGEAL MANOMETRY (EM);  Surgeon: Manus Gunning, MD;  Location: [facility];  Service: Gastroenterology;  Laterality: N/A;  . EYE SURGERY     Cataract eye surgery-right 06/2014, left 04/2014  . TONSILLECTOMY     Childhood   Family History  Problem Relation Age of Onset  . CVA Mother   . Hyperlipidemia Mother   . Hypertension Mother   . Anuerysm Father         brain  . Stroke Maternal Grandmother   . Hypertension Maternal Grandmother   . Hypertension Sister   . Scoliosis Sister   . Colon cancer Neg Hx   . Heart attack Neg Hx   . Breast cancer Neg Hx    Social History   Socioeconomic History  . Marital status: Single    Spouse name: Not on file  . Number of children: Not on file  . Years of education: Not on file  . Highest education level: Not on file  Occupational History  . Occupation: retired  Tobacco Use  . Smoking status: Never Smoker  . Smokeless tobacco: Never Used  Substance and Sexual Activity  . Alcohol use: Yes    Alcohol/week: 1.0 standard drink    Types: 1 Glasses of wine per week    Comment: 1 glass of wine 3x a week  . Drug use: No  . Sexual activity: Not on file  Other Topics Concern  . Not on file  Social History Narrative   Work or School: [employer] Situation: lives alone      Spiritual Beliefs: Jewish      Lifestyle: [facility]; diet healthy      Per [facility] New Patient Packet:      Diet: Heart healthy, low-fat      Caffeine: 1 cup of tea daily       Married, if yes what year: Single      Do you live in a house, apartment, assisted living, condo, trailer, ect: Apartment, 1 person, 7 stories       Pets: No      Current/Past profession: PHD, Education officer, museum      Exercise: Yes, Treadmill, weights, and walking          Living Will: Yes   DNR: Yes   POA/HPOA: Yes      Functional Status:   Do you have difficulty bathing or dressing yourself? No   Do you have difficulty preparing food or eating? No   Do you have difficulty managing your medications? No   Do you have difficulty managing your finances? No   Do you have difficulty affording your medications? No         Social Determinants of Health   Financial Resource Strain:   . Difficulty of Paying Living Expenses:   Food Insecurity:   . Worried About Charity fundraiser in the Last Year:   . Academic librarian in the Last Year:   Transportation Needs:   . Film/video editor (Medical):   [name] Kitchen Lack of Transportation (Non-Medical):   Physical Activity:   . Days of Exercise per Week:   . Minutes of Exercise per Session:   Stress:   . Feeling of Stress :   Social Connections:   . Frequency of Communication with Friends and Family:   . Frequency of Social Gatherings  with Friends and Family:   . Attends Religious Services:   . Active Member of Clubs or Organizations:   . Attends Archivist Meetings:   [name] Kitchen Marital Status:     Tobacco Counseling Counseling given: Not Answered   Clinical Intake:     Pain : No/denies pain     Nutritional Status: BMI of 19-24  Normal Nutritional Risks:  (weight loss about #15Ibs 2019, no weight loss in the past year) Diabetes: No  How often do you need to have someone help you when you read instructions, pamphlets, or other written materials from your doctor or pharmacy?: 1 - Never What is the last grade level you completed in school?: PHD  Diabetic? ni  Interpreter Needed?: No  Information entered by :: [place] NP   Activities of Daily Living In your present state of health, do you have any difficulty performing the following activities: 02/09/2020  Hearing? N  Vision? N  Difficulty concentrating or making decisions? N  Walking or climbing stairs? N  Dressing or bathing? N  Doing errands, shopping? N  Preparing Food and eating ? N  Using the Toilet? N  In the past six months, have you accidently leaked urine? N  Do you have problems with loss of bowel control? N  Managing your Medications? N  Managing your Finances? N  Housekeeping or managing your Housekeeping? N  Some recent data might be hidden    Patient Care Team: Virgie Dad, MD as PCP - General (Internal Medicine) Jettie Booze, MD as PCP - Cardiology (Cardiology) Jettie Booze, MD as Consulting Physician (Cardiology)  Indicate any recent  Medical Services you may have received from other than [facility] providers in the past year (date may be approximate).     Assessment:   This is a routine wellness examination for [place].  Hearing/Vision screen No exam data present  Dietary issues and exercise activities discussed: Current Exercise Habits: Home exercise routine, Type of exercise: (S) strength training/weights;stretching;treadmill;walking;Other - see comments, Time (Minutes): 60, Frequency (Times/Week): 7, Weekly Exercise (Minutes/Week): 420, Intensity: Moderate, Exercise limited by: cardiac condition(s);orthopedic condition(s)  Goals    . Increase physical activity     More social activity involvements      Depression Screen PHQ 2/9 Scores 02/09/2020 12/08/2017 08/25/2016 08/22/2015 08/18/2014  PHQ - 2 Score 0 0 0 0 0  PHQ- 9 Score 0 - - - -    Fall Risk Fall Risk  10/07/2019 07/08/2019 05/06/2019 03/11/2019 02/25/2019  Falls in the past year? 0 0 0 0 0  Number falls in past yr: 0 0 0 0 0  Injury with Fall? - - - 0 0    Any stairs in or around the home? Yes  If so, are there any without handrails? Yes  Home free of loose throw rugs in walkways, pet beds, electrical cords, etc? Yes  Adequate lighting in your home to reduce risk of falls? Yes   ASSISTIVE DEVICES UTILIZED TO PREVENT FALLS:  Life alert? No  Use of a cane, walker or w/c? no Grab bars in the bathroom? yes Shower chair or bench in shower? no Elevated toilet seat or a handicapped toilet? no  TIMED UP AND GO: NA  Was the test performed? NA Length of time to ambulate 10 feet: NA  Normal gait  Cognitive Function: MMSE - Mini Mental State Exam 02/09/2020  Orientation to time 5  Orientation to Place 5  Registration 3  Attention/ Calculation 3  Recall 3  Language- name 2 objects 2  Language- repeat 1  Language- follow 3 step command 3  Language- read & follow direction 1  Write a sentence 1  Copy design 1  Total score 28         Immunizations Immunization History  Administered Date(s) Administered  . Influenza, High Dose Seasonal PF 06/05/2015, 06/13/2016, 06/15/2017, 05/31/2018, 05/16/2019  . Influenza,inj,Quad PF,6+ Mos 05/19/2014  . Influenza,inj,quad, With Preservative 05/31/2018  . Moderna SARS-COVID-2 Vaccination 08/08/2019, 09/05/2019  . Tdap 06/06/2010  . Zoster 05/13/2011    Due Tdap   Qualifies for Shingles Vaccine? Declined Shigrix.  Zostavax completed   Screening Tests Health Maintenance  Topic Date Due  . PNA vac Low Risk Adult (1 of 2 - PCV13) Never done  . DEXA SCAN  09/24/2018  . INFLUENZA VACCINE  03/04/2020  . TETANUS/TDAP  06/06/2020  . COVID-19 Vaccine  Completed    Health Maintenance  Health Maintenance Due  Topic Date Due  . PNA vac Low Risk Adult (1 of 2 - PCV13) Never done  . DEXA SCAN  09/24/2018    Colonoscopy aged out, mammogram aged out, DEXA declined.  Lung Cancer Screening: (Low Dose CT Chest recommended if Age 28-80 years, 30 pack-year currently smoking OR have quit w/in 15years.) does not qualify.   Lung Cancer Screening Referral: no  Additional Screening:  Hepatitis C Screening:  Does not  qualify  Vision Screening: Recommended annual ophthalmology exams for early detection of glaucoma and other disorders of the eye. Is the patient up to date with their annual eye exam? up to date 12/2019 Who is the provider or what is the name of the office in which the patient attends annual eye exams? Dr. [facility] If pt is not established with a provider, would they like to be referred to a provider to establish care? no  Dental Screening: Recommended annual dental exams for proper oral hygiene Dentist Dr. Mirna Mires  Community Resource Referral / Chronic Care Management: CRR required this visit?  no  CCM required this visit?  no     Plan:     I have personally reviewed and noted the following in the patient's chart:   . Medical and  social history . Use of alcohol, tobacco or illicit drugs  . Current medications and supplements . Functional ability and status . Nutritional status . Physical activity . Advanced directives . List of other physicians . Hospitalizations, surgeries, and ER visits in previous 12 months . Vitals . Screenings to include cognitive, depression, and falls . Referrals and appointments  In addition, I have reviewed and discussed with patient certain preventive protocols, quality metrics, and best practice recommendations. A written personalized care plan for preventive services as well as general preventive health recommendations were provided to patient.     Shermeka Rutt X Kaysi Ourada, NP   02/09/2020

## 2020-02-14 DIAGNOSIS — W19XXXA Unspecified fall, initial encounter: Secondary | ICD-10-CM | POA: Diagnosis not present

## 2020-02-14 DIAGNOSIS — S40012A Contusion of left shoulder, initial encounter: Secondary | ICD-10-CM | POA: Diagnosis not present

## 2020-02-14 DIAGNOSIS — S6000XA Contusion of unspecified finger without damage to nail, initial encounter: Secondary | ICD-10-CM | POA: Diagnosis not present

## 2020-02-14 DIAGNOSIS — M79642 Pain in left hand: Secondary | ICD-10-CM | POA: Diagnosis not present

## 2020-02-22 ENCOUNTER — Encounter (INDEPENDENT_AMBULATORY_CARE_PROVIDER_SITE_OTHER): Payer: Self-pay | Admitting: Ophthalmology

## 2020-02-22 ENCOUNTER — Ambulatory Visit (INDEPENDENT_AMBULATORY_CARE_PROVIDER_SITE_OTHER): Payer: Medicare HMO | Admitting: Ophthalmology

## 2020-02-22 ENCOUNTER — Other Ambulatory Visit: Payer: Self-pay

## 2020-02-22 DIAGNOSIS — H3322 Serous retinal detachment, left eye: Secondary | ICD-10-CM | POA: Insufficient documentation

## 2020-02-22 DIAGNOSIS — H40003 Preglaucoma, unspecified, bilateral: Secondary | ICD-10-CM | POA: Diagnosis not present

## 2020-02-22 DIAGNOSIS — H353111 Nonexudative age-related macular degeneration, right eye, early dry stage: Secondary | ICD-10-CM | POA: Insufficient documentation

## 2020-02-22 DIAGNOSIS — H3320 Serous retinal detachment, unspecified eye: Secondary | ICD-10-CM | POA: Insufficient documentation

## 2020-02-22 NOTE — Assessment & Plan Note (Signed)

## 2020-02-22 NOTE — Progress Notes (Signed)
02/22/2020     CHIEF COMPLAINT Patient presents for Retina Follow Up   HISTORY OF PRESENT ILLNESS: Stacy [name] is a 79 y.o. female who presents to the clinic today for:   HPI    Retina Follow Up    Patient presents with  Dry AMD.  In both eyes.  This started 10 months ago.  Duration of 10 months.  Since onset it is stable.          Comments    10 month f/u dilated exam and OCT(MAC) today.  Pt denies noticeable changes to [place] OU since last visit. Pt denies ocular pain, flashes of light, or floaters OU.         Last edited by Melburn Popper, COA on 02/22/2020  9:51 AM. (History)      Referring physician: Mast, Man X, NP [address],  [place] [address]  HISTORICAL INFORMATION:   Selected notes from the MEDICAL RECORD NUMBER       CURRENT MEDICATIONS: No current outpatient medications on file. (Ophthalmic Drugs)   No current facility-administered medications for this visit. (Ophthalmic Drugs)   Current Outpatient Medications (Other)  Medication Sig  . aspirin 81 MG tablet Take 81 mg by mouth daily.  . Cholecalciferol (VITAMIN D-3) 1000 UNITS CAPS Take 1,000 Units by mouth 2 (two) times daily.   [name] Kitchen diltiazem (CARTIA XT) 120 MG 24 hr capsule Take 1 capsule (120 mg total) by mouth at bedtime.  . irbesartan (AVAPRO) 300 MG tablet Take 0.5 tablets (150 mg total) by mouth daily.  [name] Kitchen levothyroxine (SYNTHROID) 100 MCG tablet Take 1 tablet (100 mcg total) by mouth daily before breakfast.  . LORazepam (ATIVAN) 0.5 MG tablet Take 1 tablet (0.5 mg total) by mouth at bedtime as needed for anxiety.  . Multiple Vitamin (MULTIVITAMIN) capsule Take 1 capsule by mouth daily.  . Multiple Vitamins-Minerals (PRESERVISION AREDS PO) Take 1 tablet by mouth daily.   No current facility-administered medications for this visit. (Other)      REVIEW OF SYSTEMS:    ALLERGIES Allergies  Allergen Reactions  . Amoxicillin Swelling  . Flonase [Fluticasone Propionate]  Swelling    Swelling of throat per patient Swelling of throat per patient  . Sulfa Antibiotics Other (See Comments)    Upset stomach  . Fluticasone Propionate Swelling    Swelling of throat per patient  . Sulfasalazine Other (See Comments)    Upset stomach    PAST MEDICAL HISTORY Past Medical History:  Diagnosis Date  . Atrial fibrillation ([place])    echo 12/23/10 EF= >55%, stress myoview 01/30/11 normal pattern of perfusion in all myocardial regions  . Carotid artery stenosis    Per [facility] new patient packet   . Chicken pox   . Colon polyp   . Colon polyp   . Dyslipidemia   . Heart murmur   . Hiatal hernia   . Hypothyroidism   . Laryngopharyngeal reflux   . Osteopenia   . Scoliosis   . Seasonal allergies   . Thyroid disease   . Vitamin D deficiency    Past Surgical History:  Procedure Laterality Date  . BREAST CYST EXCISION Right 1988  . ESOPHAGEAL MANOMETRY N/A 08/29/2015   Procedure: ESOPHAGEAL MANOMETRY (EM);  Surgeon: Manus Gunning, MD;  Location: [facility];  Service: Gastroenterology;  Laterality: N/A;  . EYE SURGERY     Cataract eye surgery-right 06/2014, left 04/2014  . TONSILLECTOMY     Childhood  FAMILY HISTORY Family History  Problem Relation Age of Onset  . CVA Mother   . Hyperlipidemia Mother   . Hypertension Mother   . Anuerysm Father        brain  . Stroke Maternal Grandmother   . Hypertension Maternal Grandmother   . Hypertension Sister   . Scoliosis Sister   . Colon cancer Neg Hx   . Heart attack Neg Hx   . Breast cancer Neg Hx     SOCIAL HISTORY Social History   Tobacco Use  . Smoking status: Never Smoker  . Smokeless tobacco: Never Used  Substance Use Topics  . Alcohol use: Yes    Alcohol/week: 1.0 standard drink    Types: 1 Glasses of wine per week    Comment: 1 glass of wine 3x a week  . Drug use: No         OPHTHALMIC EXAM:  Base Eye Exam    Visual Acuity (ETDRS)      Right Left   Dist cc 20/25 -2 20/20 -2    Correction: Glasses       Tonometry (Tonopen, 9:55 AM)      Right Left   Pressure 12 13       Pupils      Pupils Dark Light Shape React APD   Right PERRL 3 2 Round Slow None   Left PERRL 3 2 Round Slow None       Visual Fields (Counting fingers)      Left Right    Full Full       Extraocular Movement      Right Left    Full Full       Neuro/Psych    Oriented x3: Yes   Mood/Affect: Normal       Dilation    Both eyes: 1.0% Mydriacyl, 2.5% Phenylephrine @ 9:55 AM        Slit Lamp and Fundus Exam    External Exam      Right Left   External Normal Normal       Slit Lamp Exam      Right Left   Lids/Lashes Normal Normal   Conjunctiva/Sclera White and quiet White and quiet   Cornea Clear Clear   Anterior Chamber Deep and quiet Deep and quiet   Iris Round and reactive Round and reactive   Lens Posterior chamber intraocular lens Posterior chamber intraocular lens   Anterior Vitreous Normal Normal       Fundus Exam      Right Left   Posterior Vitreous Posterior vitreous detachment Posterior vitreous detachment   Disc Normal Normal   C/D Ratio 0.25 0.25   Macula Hard drusen, no macular thickening, no exudates Hard drusen, no macular thickening, no exudates   Vessels Normal Normal   Periphery Normal Normal          IMAGING AND PROCEDURES  Imaging and Procedures for 02/22/20  OCT, Retina - OU - Both Eyes       Right Eye Quality was good. Scan locations included subfoveal. Central Foveal Thickness: 290. Progression has been stable. Findings include no SRF, no IRF, retinal drusen .   Left Eye Quality was good. Scan locations included subfoveal. Central Foveal Thickness: 281. Progression has been stable. Findings include retinal drusen .                 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 The results of the AREDS 2 study was discussed with the patient. A diet rich in dark leafy green vegetables was advised and specific recommendations were made regarding supplements with AREDS 2 formulation . Control of hypertension and serum cholesterol may slow the disease. Smoking cessation is mandatory to slow the disease and diminish the risk of progressing to wet age related macular degeneration. The patient was instructed in the use of an [place] and was told to return immediately for any changes in the Grid. Stressed to the patient do not rub eyes      ICD-10-CM   1. 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 Glaucoma suspect of both eyes  H40.003   3. Serous retinal detachment of left eye  H33.22     1.  Risk retinal detachment left eye in the distant past, monitored and treated with observation alone.  Stable  2.  Low risk dry ARMD present  3.  Ophthalmic Meds Ordered this visit:  No orders of the defined types were placed in this encounter.      Return in about 1 year (around 02/21/2021) for dilate, COLOR FP, OCT, DILATE OU.  There are no Patient Instructions on file for this visit.   Explained the diagnoses, plan, and follow up with the patient and they expressed understanding.  Patient expressed understanding of the importance of proper follow up care.   Clent Demark Elishua Radford M.D. Diseases & Surgery of the Retina and Vitreous [facility]ina [facility] [place] 02/22/20     Abbreviations: M myopia (nearsighted); A astigmatism; H hyperopia (farsighted); P presbyopia; Mrx spectacle prescription;  CTL contact lenses; OD right eye; OS left eye; OU both eyes  XT exotropia; ET esotropia; PEK punctate epithelial keratitis; PEE punctate epithelial erosions; DES dry eye syndrome; MGD meibomian gland dysfunction; ATs artificial tears; PFAT's preservative free artificial tears; [place] nuclear sclerotic  cataract; [facility] posterior subcapsular cataract; ERM epi-retinal membrane; PVD posterior vitreous detachment; RD retinal detachment; DM diabetes mellitus; DR diabetic retinopathy; NPDR non-proliferative diabetic retinopathy; PDR proliferative diabetic retinopathy; CSME clinically significant macular edema; DME diabetic macular edema; dbh dot blot hemorrhages; CWS cotton wool spot; POAG primary open angle glaucoma; C/D cup-to-disc ratio; HVF humphrey visual field; GVF goldmann visual field; OCT optical coherence tomography; IOP intraocular pressure; BRVO Branch retinal vein occlusion; CRVO central retinal vein occlusion; CRAO central retinal artery occlusion; BRAO branch retinal artery occlusion; RT retinal tear; SB scleral buckle; PPV pars plana vitrectomy; VH Vitreous hemorrhage; PRP panretinal laser photocoagulation; IVK intravitreal kenalog; VMT vitreomacular traction; MH Macular hole;  NVD neovascularization of the disc; NVE neovascularization elsewhere; AREDS age related eye disease study; ARMD age related macular degeneration; POAG primary open angle glaucoma; EBMD epithelial/anterior basement membrane dystrophy; ACIOL anterior chamber intraocular lens; IOL intraocular lens; PCIOL posterior chamber intraocular lens; Phaco/IOL phacoemulsification with intraocular lens placement; [place] photorefractive keratectomy; LASIK laser assisted in situ keratomileusis; HTN hypertension; DM diabetes mellitus; COPD chronic obstructive pulmonary disease

## 2020-03-08 ENCOUNTER — Ambulatory Visit: Payer: Medicare HMO | Admitting: Nurse Practitioner

## 2020-03-08 ENCOUNTER — Other Ambulatory Visit: Payer: Self-pay

## 2020-03-08 ENCOUNTER — Encounter: Payer: Self-pay | Admitting: Nurse Practitioner

## 2020-03-08 VITALS — BP 192/90 | HR 75 | Temp 97.5°F | Ht 66.0 in | Wt 129.6 lb

## 2020-03-08 DIAGNOSIS — E039 Hypothyroidism, unspecified: Secondary | ICD-10-CM | POA: Diagnosis not present

## 2020-03-08 DIAGNOSIS — I10 Essential (primary) hypertension: Secondary | ICD-10-CM | POA: Diagnosis not present

## 2020-03-08 DIAGNOSIS — E785 Hyperlipidemia, unspecified: Secondary | ICD-10-CM | POA: Diagnosis not present

## 2020-03-08 DIAGNOSIS — R69 Illness, unspecified: Secondary | ICD-10-CM | POA: Diagnosis not present

## 2020-03-08 DIAGNOSIS — I480 Paroxysmal atrial fibrillation: Secondary | ICD-10-CM | POA: Diagnosis not present

## 2020-03-08 DIAGNOSIS — M810 Age-related osteoporosis without current pathological fracture: Secondary | ICD-10-CM | POA: Diagnosis not present

## 2020-03-08 DIAGNOSIS — E1169 Type 2 diabetes mellitus with other specified complication: Secondary | ICD-10-CM | POA: Diagnosis not present

## 2020-03-08 DIAGNOSIS — F419 Anxiety disorder, unspecified: Secondary | ICD-10-CM | POA: Insufficient documentation

## 2020-03-08 NOTE — Assessment & Plan Note (Signed)
Due DEXA, continue Vit D, update Vit D level.

## 2020-03-08 NOTE — Progress Notes (Signed)
Location:    clinic [place]   Place of Service:    Provider: Marlana Latus NP  Code Status: DNR Goals of Care:  Advanced Directives 05/06/2019  Does Patient Have a Medical Advance Directive? Yes  Type of Advance Directive [place]  Does patient want to make changes to medical advance directive? No - Patient declined  Copy of [place] in Chart? Yes - validated most recent copy scanned in chart (See row information)  Would patient like information on creating a medical advance directive? -     Chief Complaint  Patient presents with  . Health Maintenance    PCV13, Dexa scan  . Medical Management of Chronic Issues    Patient returns to clinic requesting to get to know [facility], discuss her anxiety and refferal to a different cardiologist.     HPI: Patient is a 79 y.o. female seen today for medical management of chronic diseases.     HTN, takes Diltiazem, Irbesartan, ASA 22m qd  Anxiety,  prn Lorazepam.   Hypothyroidism, takes Levothyroxine  Hyperlipidemia, diet, exercise  OP due DEXA, takes Vit D  Afib, takes Diltiazem, ASA     Past Medical History:  Diagnosis Date  . Atrial fibrillation ([place]    echo 12/23/10 EF= >55%, stress myoview 01/30/11 normal pattern of perfusion in all myocardial regions  . Carotid artery stenosis    Per [facility] new patient packet   . Chicken pox   . Colon polyp   . Colon polyp   . Dyslipidemia   . Heart murmur   . Hiatal hernia   . Hypothyroidism   . Laryngopharyngeal reflux   . Osteopenia   . Scoliosis   . Seasonal allergies   . Thyroid disease   . Vitamin D deficiency     Past Surgical History:  Procedure Laterality Date  . BREAST CYST EXCISION Right 1988  . ESOPHAGEAL MANOMETRY N/A 08/29/2015   Procedure: ESOPHAGEAL MANOMETRY (EM);  Surgeon: SManus Gunning MD;  Location: [facility];  Service: Gastroenterology;  Laterality: N/A;  . EYE SURGERY     Cataract eye surgery-right 06/2014, left  04/2014  . TONSILLECTOMY     Childhood    Allergies  Allergen Reactions  . Amoxicillin Swelling  . Flonase [Fluticasone Propionate] Swelling    Swelling of throat per patient Swelling of throat per patient  . Sulfa Antibiotics Other (See Comments)    Upset stomach  . Fluticasone Propionate Swelling    Swelling of throat per patient  . Sulfasalazine Other (See Comments)    Upset stomach    Allergies as of 03/08/2020      Reactions   Amoxicillin Swelling   Flonase [fluticasone Propionate] Swelling   Swelling of throat per patient Swelling of throat per patient   Sulfa Antibiotics Other (See Comments)   Upset stomach   Fluticasone Propionate Swelling   Swelling of throat per patient   Sulfasalazine Other (See Comments)   Upset stomach      Medication List       Accurate as of March 08, 2020  2:42 PM. If you have any questions, ask your nurse or doctor.        aspirin 81 MG tablet Take 81 mg by mouth daily.   diltiazem 120 MG 24 hr capsule Commonly known as: Cartia XT Take 1 capsule (120 mg total) by mouth at bedtime.   irbesartan 300 MG tablet Commonly known as: Avapro Take 0.5 tablets (150 mg total)  by mouth daily.   levothyroxine 100 MCG tablet Commonly known as: SYNTHROID Take 1 tablet (100 mcg total) by mouth daily before breakfast.   LORazepam 0.5 MG tablet Commonly known as: ATIVAN Take 1 tablet (0.5 mg total) by mouth at bedtime as needed for anxiety.   multivitamin capsule Take 1 capsule by mouth daily.   PRESERVISION AREDS PO Take 1 tablet by mouth daily.   Vitamin D-3 25 MCG (1000 UT) Caps Take 1,000 Units by mouth 2 (two) times daily.       Review of Systems:  Review of Systems  Constitutional: Negative for activity change, appetite change, fatigue and unexpected weight change.  HENT: Negative for congestion, trouble swallowing and voice change.   Eyes: Negative for visual disturbance.  Respiratory: Negative for cough, chest tightness,  shortness of breath and wheezing.   Cardiovascular: Positive for leg swelling. Negative for chest pain and palpitations.  Gastrointestinal: Negative for abdominal distention, abdominal pain, constipation, diarrhea, nausea and vomiting.  Genitourinary: Negative for difficulty urinating, dysuria and urgency.  Musculoskeletal: Negative for back pain and gait problem.  Skin: Negative for color change and pallor.  Neurological: Negative for dizziness, tremors, speech difficulty, weakness, light-headedness and headaches.  Psychiatric/Behavioral: Positive for sleep disturbance. Negative for agitation, behavioral problems, confusion and hallucinations. The patient is nervous/anxious.     Health Maintenance  Topic Date Due  . PNA vac Low Risk Adult (1 of 2 - PCV13) Never done  . DEXA SCAN  09/24/2018  . INFLUENZA VACCINE  03/04/2020  . TETANUS/TDAP  08/04/2020  . COVID-19 Vaccine  Completed    Physical Exam: Vitals:   03/08/20 1304 03/08/20 1412  BP: (!) 210/100 (!) 192/90  Pulse: 75   Temp: (!) 97.5 F (36.4 C)   SpO2: 96%   Weight: 129 lb 9.6 oz (58.8 kg)   Height: 5' 6"  (1.676 m)    Body mass index is 20.92 kg/m. Physical Exam Vitals and nursing note reviewed.  Constitutional:      General: She is not in acute distress.    Appearance: Normal appearance. She is not ill-appearing, toxic-appearing or diaphoretic.  HENT:     Head: Normocephalic and atraumatic.     Nose: Nose normal.     Mouth/Throat:     Mouth: Mucous membranes are moist.  Eyes:     Extraocular Movements: Extraocular movements intact.     Conjunctiva/sclera: Conjunctivae normal.     Pupils: Pupils are equal, round, and reactive to light.     Comments: Right lower subconjunctival hemorrhage.   Cardiovascular:     Rate and Rhythm: Normal rate and regular rhythm.     Heart sounds: Murmur heard.   Pulmonary:     Effort: Pulmonary effort is normal.     Breath sounds: Normal breath sounds. No wheezing, rhonchi or  rales.  Abdominal:     General: Bowel sounds are normal. There is no distension.     Palpations: Abdomen is soft.     Tenderness: There is no abdominal tenderness. There is no right CVA tenderness, left CVA tenderness, guarding or rebound.  Genitourinary:    Comments: Declined to undress herself for examination.  Musculoskeletal:     Cervical back: Normal range of motion and neck supple.     Right lower leg: Edema present.     Left lower leg: Edema present.  Skin:    General: Skin is warm and dry.  Neurological:     General: No focal deficit present.  Mental Status: She is alert and oriented to person, place, and time. Mental status is at baseline.     Motor: No weakness.     Coordination: Coordination normal.     Gait: Gait normal.  Psychiatric:        Mood and Affect: Mood normal.        Thought Content: Thought content normal.        Judgment: Judgment normal.     Comments: Anxious, racing speech     Labs reviewed: Basic Metabolic Panel: Recent Labs    03/17/19 0700 04/06/19 0000 05/04/19 1152  NA 137 136  --   K 3.8 3.8 4.1  CL 100 100  --   CO2 29 28  --   GLUCOSE 97 83  --   BUN 14 13  --   CREATININE 0.73 0.73  --   CALCIUM 9.8 9.4  --   TSH 1.63  --  3.39   Liver Function Tests: Recent Labs    03/17/19 0700 04/06/19 0000  AST 20 17  ALT 17 17  BILITOT 0.8 0.7  PROT 6.8 6.3   No results for input(s): LIPASE, AMYLASE in the last 8760 hours. No results for input(s): AMMONIA in the last 8760 hours. CBC: Recent Labs    03/17/19 0700  WBC 5.2  NEUTROABS 2,688  HGB 14.7  HCT 43.8  MCV 96.7  PLT 159   Lipid Panel: Recent Labs    03/17/19 0700  CHOL 271*  HDL 84  LDLCALC 170*  TRIG 71  CHOLHDL 3.2   No results found for: HGBA1C  Procedures since last visit: OCT, Retina - OU - Both Eyes  Result Date: 02/22/2020 Right Eye Quality was good. Scan locations included subfoveal. Central Foveal Thickness: 290. Progression has been stable.  Findings include no SRF, no IRF, retinal drusen . Left Eye Quality was good. Scan locations included subfoveal. Central Foveal Thickness: 281. Progression has been stable. Findings include retinal drusen .    Assessment/Plan  Atrial fibrillation (HCC) Heart rate is controlled, continue Diltiazem. Declined EKG  Osteoporosis Due DEXA, continue Vit D, update Vit D level.   Dyslipidemia Diet, exercise, LDL 170 a year ago, update lipid panel.   Hypothyroidism Takes Levothyroxine, update TSH, CBC/diff  Hypertension Not well controlled in clinic [place], but the patient stated she has white coat syndrome, denied headache, dizziness, change of vision, chest pain/pressure, palpitation, nausea, vomiting, or abd pain continue Irbesartan, ASA, Diltiazem, desires a new cardiology. Update CMP/eGFR  Anxiety Prn Lorazepam,    Labs/tests ordered:  EKG, CBC/diff, CMP/eGFR, TSH, lipid panel, Vit D  Next appt:  3-4 months with Dr. Lyndel Safe

## 2020-03-08 NOTE — Assessment & Plan Note (Signed)
Diet, exercise, LDL 170 a year ago, update lipid panel.

## 2020-03-08 NOTE — Assessment & Plan Note (Signed)
Prn Lorazepam,

## 2020-03-08 NOTE — Assessment & Plan Note (Addendum)
Not well controlled in clinic [place], but the patient stated she has white coat syndrome, denied headache, dizziness, change of vision, chest pain/pressure, palpitation, nausea, vomiting, or abd pain continue Irbesartan, ASA, Diltiazem, desires a new cardiology. Update CMP/eGFR

## 2020-03-08 NOTE — Assessment & Plan Note (Addendum)
Takes Levothyroxine, update TSH, CBC/diff

## 2020-03-08 NOTE — Assessment & Plan Note (Addendum)
Heart rate is controlled, continue Diltiazem. Declined EKG

## 2020-03-12 ENCOUNTER — Other Ambulatory Visit: Payer: Self-pay | Admitting: Nurse Practitioner

## 2020-03-12 DIAGNOSIS — E1169 Type 2 diabetes mellitus with other specified complication: Secondary | ICD-10-CM | POA: Diagnosis not present

## 2020-03-12 DIAGNOSIS — M810 Age-related osteoporosis without current pathological fracture: Secondary | ICD-10-CM

## 2020-03-12 DIAGNOSIS — E039 Hypothyroidism, unspecified: Secondary | ICD-10-CM | POA: Diagnosis not present

## 2020-03-12 DIAGNOSIS — E785 Hyperlipidemia, unspecified: Secondary | ICD-10-CM

## 2020-03-12 DIAGNOSIS — I10 Essential (primary) hypertension: Secondary | ICD-10-CM

## 2020-03-13 ENCOUNTER — Other Ambulatory Visit: Payer: Self-pay

## 2020-03-14 ENCOUNTER — Other Ambulatory Visit: Payer: Self-pay | Admitting: Nurse Practitioner

## 2020-03-14 DIAGNOSIS — E039 Hypothyroidism, unspecified: Secondary | ICD-10-CM

## 2020-03-14 MED ORDER — LEVOTHYROXINE SODIUM 125 MCG PO TABS: 125.0000 ug | ORAL_TABLET | Freq: Every day | ORAL | 2 refills | Status: DC

## 2020-03-14 NOTE — Telephone Encounter (Signed)
Message Routed to [facility] NP

## 2020-03-17 LAB — CBC WITH DIFFERENTIAL/PLATELET
Absolute Monocytes: 574 cells/uL (ref 200–950)
Basophils Absolute: 30 cells/uL (ref 0–200)
Basophils Relative: 0.8 %
Eosinophils Absolute: 141 cells/uL (ref 15–500)
Eosinophils Relative: 3.8 %
HCT: 41.9 % (ref 35.0–45.0)
Hemoglobin: 14 g/dL (ref 11.7–15.5)
Lymphs Abs: 969 cells/uL (ref 850–3900)
MCH: 33.4 pg — ABNORMAL HIGH (ref 27.0–33.0)
MCHC: 33.4 g/dL (ref 32.0–36.0)
MCV: 100 fL (ref 80.0–100.0)
MPV: 10.3 fL (ref 7.5–12.5)
Monocytes Relative: 15.5 %
Neutro Abs: 1987 cells/uL (ref 1500–7800)
Neutrophils Relative %: 53.7 %
Platelets: 143 10*3/uL (ref 140–400)
RBC: 4.19 10*6/uL (ref 3.80–5.10)
RDW: 12 % (ref 11.0–15.0)
Total Lymphocyte: 26.2 %
WBC: 3.7 10*3/uL — ABNORMAL LOW (ref 3.8–10.8)

## 2020-03-17 LAB — TSH: TSH: 9.36 mIU/L — ABNORMAL HIGH (ref 0.40–4.50)

## 2020-03-17 LAB — COMPLETE METABOLIC PANEL WITH GFR
AG Ratio: 1.8 (calc) (ref 1.0–2.5)
ALT: 17 U/L (ref 6–29)
AST: 20 U/L (ref 10–35)
Albumin: 4.1 g/dL (ref 3.6–5.1)
Alkaline phosphatase (APISO): 81 U/L (ref 37–153)
BUN: 20 mg/dL (ref 7–25)
CO2: 29 mmol/L (ref 20–32)
Calcium: 9.4 mg/dL (ref 8.6–10.4)
Chloride: 100 mmol/L (ref 98–110)
Creat: 0.78 mg/dL (ref 0.60–0.88)
GFR, Est African American: 83 mL/min/{1.73_m2} (ref >=60)
GFR, Est Non African American: 72 mL/min/{1.73_m2} (ref >=60)
Globulin: 2.3 g/dL (calc) (ref 1.9–3.7)
Glucose, Bld: 77 mg/dL (ref 65–99)
Potassium: 3.9 mmol/L (ref 3.5–5.3)
Sodium: 135 mmol/L (ref 135–146)
Total Bilirubin: 0.8 mg/dL (ref 0.2–1.2)
Total Protein: 6.4 g/dL (ref 6.1–8.1)

## 2020-03-17 LAB — LIPID PANEL
Cholesterol: 272 mg/dL — ABNORMAL HIGH (ref <200)
HDL: 83 mg/dL (ref >=50)
LDL Cholesterol (Calc): 173 mg/dL (calc) — ABNORMAL HIGH
Non-HDL Cholesterol (Calc): 189 mg/dL (calc) — ABNORMAL HIGH (ref <130)
Total CHOL/HDL Ratio: 3.3 (calc) (ref <5.0)
Triglycerides: 64 mg/dL (ref <150)

## 2020-03-17 LAB — VITAMIN D 1,25 DIHYDROXY
Vitamin D 1, 25 (OH)2 Total: 33 pg/mL (ref 18–72)
Vitamin D2 1, 25 (OH)2: <8 pg/mL
Vitamin D3 1, 25 (OH)2: 33 pg/mL

## 2020-03-19 ENCOUNTER — Other Ambulatory Visit: Payer: Self-pay

## 2020-03-19 MED ORDER — DILTIAZEM HCL ER COATED BEADS 120 MG PO CP24: 120.0000 mg | ORAL_CAPSULE | Freq: Every day | ORAL | 2 refills | Status: DC

## 2020-03-19 NOTE — Telephone Encounter (Signed)
Pt's medication was sent to pt's pharmacy as requested. Confirmation received.  °

## 2020-03-20 ENCOUNTER — Encounter: Payer: Self-pay | Admitting: Internal Medicine

## 2020-03-20 ENCOUNTER — Ambulatory Visit: Payer: Medicare HMO | Admitting: Internal Medicine

## 2020-03-20 ENCOUNTER — Other Ambulatory Visit: Payer: Self-pay

## 2020-03-20 VITALS — BP 140/96 | HR 80 | Ht 66.0 in | Wt 128.0 lb

## 2020-03-20 DIAGNOSIS — E039 Hypothyroidism, unspecified: Secondary | ICD-10-CM | POA: Diagnosis not present

## 2020-03-20 NOTE — Patient Instructions (Addendum)
Please continue Levothyroxine 100 mg daily.  Take the thyroid hormone every day, with water, at least 30 minutes before breakfast, separated by at least 4 hours from: - acid reflux medications - calcium - iron - multivitamins  Please stop at the lab.  Please return for another visit in 6 months.

## 2020-03-20 NOTE — Progress Notes (Signed)
Patient ID: Stacy [name], female   DOB: 03-18-1941, 79 y.o.   MRN: [id number]   This visit occurred during the SARS-CoV-2 public health emergency.  Safety protocols were in place, including screening questions prior to the visit, additional usage of staff PPE, and extensive cleaning of exam room while observing appropriate contact time as indicated for disinfecting solutions.   HPI  Stacy [name] is a 79 y.o.-year-old female, initially referred by her PCP, Dr. Urbano Heir for f/u for  hypothyroidism and HTN.  Last visit 10 months ago.  Pt. has been dx with hypothyroidism since her early 37s >> prev. on Levothyroxine 100 mcg x 8 tab a week >> changed to LT4 100 mcg daily in 12/2018.  She takes the levothyroxine: - in am - fasting - at least 1 hour from b'fast - no Ca except occasional Tums at night, Fe, PPIs, + MVIs at lunchtime - not on Biotin  I reviewed patient's TFTs: Lab Results  Component Value Date   TSH 9.36 (H) 03/12/2020   TSH 3.39 05/04/2019   TSH 1.63 03/17/2019   TSH 0.51 12/06/2018   TSH 1.22 08/10/2017   TSH 0.37 08/25/2016   TSH 1.14 02/20/2016   TSH 0.64 08/22/2015   TSH 0.88 08/18/2014   TSH 1.18 08/17/2013   FREET4 1.33 05/04/2019   FREET4 1.36 08/17/2013   Antithyroid antibodies: No results found for: THGAB No components found for: TPOAB   Thyroid ultrasound (09/03/2016): Showed a small heterogeneous thyroid with no discrete nodules.  Pt denies: - feeling nodules in neck - hoarseness - dysphagia - choking - SOB with lying down  No FH of thyroid disease or thyroid cancer. No h/o radiation tx to head or neck.  No herbal supplements. No Biotin use. No recent steroids use.   Weight loss:  At last visit, she mentions that she lost 16 pounds in the previous year- 12 lbs until 12/2018 and 4 lbs after this.  However, she gained 2 pounds before last visit.  Her appetite was normal. She did describe that starting 05/2018, she started to adjust her diet  >> cut down fatty foods for CV health. She could not tolerate statins.  At the independent living facility where she lives, she is on a meal plan with a lot of veggies and fruit.  Of note, she has a large diaphragmatic hernia containing stomach, EG junction, transverse colon and pancreas per review of her CT scan report from 04/07/2015.  However, this did not appear to affect the esophageal transit time per review of her barium swallow from 06/15/2015.  She was exercising before the coronavirus pandemic, however, afterwards, which is walking>> now restarted exercise at the [facility]: Cardio, strength, flexibility -5 days a week.  No history of diabetes.  No history of chronic steroid use.  We reviewed possible reasons for weight loss and I could not find any endocrine causes for this.  HTN   Her blood pressure was normal: 120/70 until 12/2018 >> however, since then, she had paroxysms into 180-200/82.  At last visit: 140/70.  At this visit, 140/96.  She was started on diltiazem 120 mg daily for A. fib, which was diagnosed in 2012.  After her diagnosis of hypertension: Her diltiazem dose was doubled to 240 mg daily.  She was also started on losartan 50 mg daily >> now Irbesartan.  At last visit, we ruled out pheochromocytoma, hyperthyroidism, and hyperaldosteronism as potential causes for her hypertension: Component     Latest Ref Rng & Units 05/04/2019  Epinephrine     pg/mL 52  Norepinephrine     pg/mL 995  Dopamine     pg/mL 42 (H)  Total Catecholamines     pg/mL 1,089  Metanephrine, Pl     <=57 pg/mL 31  Normetanephrine, Pl     <=148 pg/mL 158 (H)  Total Metanephrines-Plasma     <=205 pg/mL 189  ALDOSTERONE     ng/dL 7  Renin Activity     0.25 - 5.82 ng/mL/h 0.48  ALDO / PRA Ratio     0.9 - 28.9 Ratio 14.6  Potassium     3.5 - 5.1 mEq/L 4.1  TSH     0.35 - 4.50 uIU/mL 3.39  T4,Free(Direct)     0.60 - 1.60 ng/dL 1.33  At that time, she had a nonspecific elevation in  normetanephrine's and dopamine, otherwise lab was normal.  Pt. also has a history of osteoporosis, for which she refused treatment.  She also has significant scoliosis.  She has no back pain.  She has seen orthopedics and a scoliosis specialist in the past.  She is a retired Network engineer.  ROS: Constitutional: no weight gain/no weight loss, no fatigue, no subjective hyperthermia, no subjective hypothermia, + poor sleep, + nocturia Eyes: no blurry vision, no xerophthalmia ENT: no sore throat, + see HPI Cardiovascular: no CP/no SOB/no palpitations/+ leg swelling Respiratory: no cough/no SOB/no wheezing Gastrointestinal: no N/no V/no D/no C/+ acid reflux Musculoskeletal: no muscle aches/no joint aches Skin: no rashes, no hair loss, + easy bruising Neurological: no tremors/no numbness/no tingling/no dizziness  I reviewed pt's medications, allergies, PMH, social hx, family hx, and changes were documented in the history of present illness. Otherwise, unchanged from my initial visit note.  Past Medical History:  Diagnosis Date  . Atrial fibrillation ([place])    echo 12/23/10 EF= >55%, stress myoview 01/30/11 normal pattern of perfusion in all myocardial regions  . Carotid artery stenosis    Per [facility] new patient packet   . Chicken pox   . Colon polyp   . Colon polyp   . Dyslipidemia   . Heart murmur   . Hiatal hernia   . Hypothyroidism   . Laryngopharyngeal reflux   . Osteopenia   . Scoliosis   . Seasonal allergies   . Thyroid disease   . Vitamin D deficiency    Past Surgical History:  Procedure Laterality Date  . BREAST CYST EXCISION Right 1988  . ESOPHAGEAL MANOMETRY N/A 08/29/2015   Procedure: ESOPHAGEAL MANOMETRY (EM);  Surgeon: Manus Gunning, MD;  Location: [facility];  Service: Gastroenterology;  Laterality: N/A;  . EYE SURGERY     Cataract eye surgery-right 06/2014, left 04/2014  . TONSILLECTOMY     Childhood   Social History   Socioeconomic History  . Marital  status: Single    Spouse name: Not on file  . Number of children: Not on file  . Years of education: Not on file  . Highest education level: Not on file  Occupational History  . Occupation: retired  Tobacco Use  . Smoking status: Never Smoker  . Smokeless tobacco: Never Used  Substance and Sexual Activity  . Alcohol use: Yes    Alcohol/week: 1.0 standard drink    Types: 1 Glasses of wine per week    Comment: 1 glass of wine 3x a week  . Drug use: No  . Sexual activity: Not on file  Other Topics Concern  . Not on file  Social History Narrative  Work or School: Probation officer - poetry      Home Situation: lives alone      Spiritual Beliefs: Jewish      Lifestyle: 3x per week at the [employer] exercise; diet healthy      Per [place] New Patient Packet:      Diet: Heart healthy, low-fat      Caffeine: 1 cup of tea daily       Married, if yes what year: Single      Do you live in a house, apartment, assisted living, condo, trailer, ect: Apartment, 1 person, 7 stories       Pets: No      Current/Past profession: PHD, Education officer, museum      Exercise: Yes, Treadmill, weights, and walking          Living Will: Yes   DNR: Yes   POA/HPOA: Yes      Functional Status:   Do you have difficulty bathing or dressing yourself? No   Do you have difficulty preparing food or eating? No   Do you have difficulty managing your medications? No   Do you have difficulty managing your finances? No   Do you have difficulty affording your medications? No         Social Determinants of Health   Financial Resource Strain:   . Difficulty of Paying Living Expenses:   Food Insecurity:   . Worried About Charity fundraiser in the Last Year:   . Arboriculturist in the Last Year:   Transportation Needs:   . Film/video editor (Medical):   [name] Kitchen Lack of Transportation (Non-Medical):   Physical Activity:   . Days of Exercise per Week:   . Minutes of Exercise per Session:   Stress:   . Feeling of Stress :    Social Connections:   . Frequency of Communication with Friends and Family:   . Frequency of Social Gatherings with Friends and Family:   . Attends Religious Services:   . Active Member of Clubs or Organizations:   . Attends Archivist Meetings:   [name] Kitchen Marital Status:   Intimate Partner Violence:   . Fear of Current or Ex-Partner:   . Emotionally Abused:   [name] Kitchen Physically Abused:   . Sexually Abused:    Current Outpatient Medications on File Prior to Visit  Medication Sig Dispense Refill  . aspirin 81 MG tablet Take 81 mg by mouth daily.    . Cholecalciferol (VITAMIN D-3) 1000 UNITS CAPS Take 1,000 Units by mouth 2 (two) times daily.     [name] Kitchen diltiazem (CARTIA XT) 120 MG 24 hr capsule Take 1 capsule (120 mg total) by mouth at bedtime. 90 capsule 2  . irbesartan (AVAPRO) 300 MG tablet Take 0.5 tablets (150 mg total) by mouth daily. 90 tablet 0  . levothyroxine (SYNTHROID) 125 MCG tablet Take 1 tablet (125 mcg total) by mouth daily before breakfast. 30 tablet 2  . LORazepam (ATIVAN) 0.5 MG tablet Take 1 tablet (0.5 mg total) by mouth at bedtime as needed for anxiety. 30 tablet 0  . Multiple Vitamin (MULTIVITAMIN) capsule Take 1 capsule by mouth daily.    . Multiple Vitamins-Minerals (PRESERVISION AREDS PO) Take 1 tablet by mouth daily.     No current facility-administered medications on file prior to visit.   Allergies  Allergen Reactions  . Amoxicillin Swelling  . Flonase [Fluticasone Propionate] Swelling    Swelling of throat per patient Swelling of throat per patient  .  Sulfa Antibiotics Other (See Comments)    Upset stomach  . Fluticasone Propionate Swelling    Swelling of throat per patient  . Sulfasalazine Other (See Comments)    Upset stomach   Family History  Problem Relation Age of Onset  . CVA Mother   . Hyperlipidemia Mother   . Hypertension Mother   . Anuerysm Father        brain  . Stroke Maternal Grandmother   . Hypertension Maternal Grandmother   .  Hypertension Sister   . Scoliosis Sister   . Colon cancer Neg Hx   . Heart attack Neg Hx   . Breast cancer Neg Hx     PE: BP (!) 140/96   Pulse 80   Ht 5\' 6"  (1.676 m)   Wt 128 lb (58.1 kg)   SpO2 95%   BMI 20.66 kg/m  Wt Readings from Last 3 Encounters:  03/20/20 128 lb (58.1 kg)  03/08/20 129 lb 9.6 oz (58.8 kg)  02/09/20 129 lb (58.5 kg)   Constitutional: Normal weight, in NAD Eyes: PERRLA, EOMI, no exophthalmos ENT: moist mucous membranes, no thyromegaly, no cervical lymphadenopathy Cardiovascular: RRR, No MRG, no LE swelling but wears compression hoses Respiratory: CTA B Gastrointestinal: abdomen soft, NT, ND, BS+ Musculoskeletal: + Significant scoliosis, strength intact in all 4 Skin: moist, warm, no rashes Neurological: no tremor with outstretched hands, DTR normal in all 4  ASSESSMENT: 1. Hypothyroidism  2.  HTN  PLAN:  1. Patient with longstanding hypothyroidism, on levothyroxine therapy, with previously good control.  Her dose of levothyroxine was decreased slightly to 100 mcg daily with subsequent excellent TFTs in 03/2019.  She had normal thyroid tests dating back to 2012.  However, she had a recent TSH which was high 7 days ago  in her PCPs office and she scheduled this appointment. -No new symptoms.  At last visit she did complain of weight loss and we reviewed possible causes for this, but I did not feel that this was related to her hypothyroidism. - latest thyroid labs reviewed with pt. - she was very surprised about the suddenly elevated TSH level: Lab Results  Component Value Date   TSH 9.36 (H) 03/12/2020   - she continues on LT4 100 mcg daily, although she was advised by her PCP to increase the dose to 125 mcg daily after the above results returned.  Instead, she decided to return to see me. - we discussed about taking the thyroid hormone every day, with water, >30 minutes before breakfast, separated by >4 hours from acid reflux medications, calcium,  iron, multivitamins. Pt. is taking it correctly. - will recheck TSH and fT4 today as she is concerned that the new elevated TSH may be an erroneous result - After the results returned, if the TSH is still elevated, will try to increase the levothyroxine dose to 112 mcg daily and recheck her test in 1.5 months.  2.  HTN -At last visit, we ruled out endocrine causes for hypertension: Pheochromocytoma and hyperaldosteronism (please see HPI -we reviewed the above results together) -She has significant scoliosis and I am wondering whether the abnormal spine architecture can compress the paraspinal sympathetic nervous ganglia with subsequent high blood pressure. -At today's visit, blood pressure is 140/96  Component     Latest Ref Rng & Units 03/20/2020  TSH     0.35 - 4.50 uIU/mL 8.87 (H)  T4,Free(Direct)     0.60 - 1.60 ng/dL 1.03   TFTs still abnormal >> will  increase LT4 to 112 mcg daily and recheck the tests in l mo.  Philemon Kingdom, MD PhD [facility]

## 2020-03-21 LAB — T4, FREE: Free T4: 1.03 ng/dL (ref 0.60–1.60)

## 2020-03-21 LAB — TSH: TSH: 8.87 u[IU]/mL — ABNORMAL HIGH (ref 0.35–4.50)

## 2020-03-21 MED ORDER — LEVOTHYROXINE SODIUM 112 MCG PO TABS: 112.0000 ug | ORAL_TABLET | Freq: Every day | ORAL | 5 refills | Status: DC

## 2020-03-26 ENCOUNTER — Encounter (INDEPENDENT_AMBULATORY_CARE_PROVIDER_SITE_OTHER): Payer: Medicare HMO | Admitting: Ophthalmology

## 2020-03-26 ENCOUNTER — Encounter (INDEPENDENT_AMBULATORY_CARE_PROVIDER_SITE_OTHER): Payer: Self-pay | Admitting: Ophthalmology

## 2020-03-26 ENCOUNTER — Other Ambulatory Visit: Payer: Self-pay

## 2020-03-26 ENCOUNTER — Ambulatory Visit (INDEPENDENT_AMBULATORY_CARE_PROVIDER_SITE_OTHER): Payer: Medicare HMO | Admitting: Ophthalmology

## 2020-03-26 DIAGNOSIS — H353111 Nonexudative age-related macular degeneration, right eye, early dry stage: Secondary | ICD-10-CM

## 2020-03-26 DIAGNOSIS — H3322 Serous retinal detachment, left eye: Secondary | ICD-10-CM

## 2020-03-26 DIAGNOSIS — H40003 Preglaucoma, unspecified, bilateral: Secondary | ICD-10-CM | POA: Diagnosis not present

## 2020-03-26 NOTE — Patient Instructions (Signed)
Patient is notify the office promptly if new onset visual acuity difficulties    Explained to her the importance of using block, suspect notation in her diagnostic evaluation so that the clinical physician can follow-up in the next visit to monitor things as intraocular pressure and physiologic cupping since there is an asymmetry of the physiologic cup in the left eye versus the right eye which is one of the cardinal signs of potential low-tension glaucoma.  Splane the fact that over time that this is this finding in the left eye has not changed has been documented on previous EMR and now she can see this on my chart for the first time since we have been on, and epic my chart since April 2021  For the patient that I have been monitoring this over a series of years simply because of the asymmetric physiologic cupping which is a cardinal sign in some cases of potential low-tension glaucoma.  No sign of progression over time and thus it has not been a topic of concern in the past.  She has a clear understanding now that this continues to be monitored but no not a worrisome issue

## 2020-03-26 NOTE — Progress Notes (Signed)
03/26/2020     CHIEF COMPLAINT Patient presents for Retina Follow Up   HISTORY OF PRESENT ILLNESS: Stacy [name] is a 79 y.o. female who presents to the clinic today for:   HPI    Retina Follow Up    Patient presents with  Dry AMD.  In both eyes.  This started 1 month ago.  Severity is mild.  Duration of 1 month.  Since onset it is stable.          Comments    1 Month AMD F/U OU  Pt denies noticeable changes to [place] OU since last visit. Pt denies ocular pain, flashes of light, or floaters OU.         Last edited by Rockie Neighbours, [place] on 03/26/2020  2:30 PM. (History)      Referring physician: Virgie Dad, MD [place],  [place] [address]  HISTORICAL INFORMATION:   Selected notes from the MEDICAL RECORD NUMBER       CURRENT MEDICATIONS: No current outpatient medications on file. (Ophthalmic Drugs)   No current facility-administered medications for this visit. (Ophthalmic Drugs)   Current Outpatient Medications (Other)  Medication Sig  . aspirin 81 MG tablet Take 81 mg by mouth daily.  . Cholecalciferol (VITAMIN D-3) 1000 UNITS CAPS Take 1,000 Units by mouth 2 (two) times daily.   [name] Kitchen diltiazem (CARTIA XT) 120 MG 24 hr capsule Take 1 capsule (120 mg total) by mouth at bedtime.  . irbesartan (AVAPRO) 300 MG tablet Take 0.5 tablets (150 mg total) by mouth daily.  [name] Kitchen levothyroxine (SYNTHROID) 112 MCG tablet Take 1 tablet (112 mcg total) by mouth daily before breakfast.  . LORazepam (ATIVAN) 0.5 MG tablet Take 1 tablet (0.5 mg total) by mouth at bedtime as needed for anxiety.  . Multiple Vitamin (MULTIVITAMIN) capsule Take 1 capsule by mouth daily.  . Multiple Vitamins-Minerals (PRESERVISION AREDS PO) Take 1 tablet by mouth daily.   No current facility-administered medications for this visit. (Other)      REVIEW OF SYSTEMS:    ALLERGIES Allergies  Allergen Reactions  . Amoxicillin Swelling  . Flonase [Fluticasone Propionate] Swelling     Swelling of throat per patient Swelling of throat per patient  . Sulfa Antibiotics Other (See Comments)    Upset stomach  . Fluticasone Propionate Swelling    Swelling of throat per patient  . Sulfasalazine Other (See Comments)    Upset stomach    PAST MEDICAL HISTORY Past Medical History:  Diagnosis Date  . Atrial fibrillation ([place])    echo 12/23/10 EF= >55%, stress myoview 01/30/11 normal pattern of perfusion in all myocardial regions  . Carotid artery stenosis    Per [facility] new patient packet   . Chicken pox   . Colon polyp   . Colon polyp   . Dyslipidemia   . Heart murmur   . Hiatal hernia   . Hypothyroidism   . Laryngopharyngeal reflux   . Osteopenia   . Scoliosis   . Seasonal allergies   . Thyroid disease   . Vitamin D deficiency    Past Surgical History:  Procedure Laterality Date  . BREAST CYST EXCISION Right 1988  . ESOPHAGEAL MANOMETRY N/A 08/29/2015   Procedure: ESOPHAGEAL MANOMETRY (EM);  Surgeon: Manus Gunning, MD;  Location: [facility];  Service: Gastroenterology;  Laterality: N/A;  . EYE SURGERY     Cataract eye surgery-right 06/2014, left 04/2014  . TONSILLECTOMY     Childhood  FAMILY HISTORY Family History  Problem Relation Age of Onset  . CVA Mother   . Hyperlipidemia Mother   . Hypertension Mother   . Anuerysm Father        brain  . Stroke Maternal Grandmother   . Hypertension Maternal Grandmother   . Hypertension Sister   . Scoliosis Sister   . Colon cancer Neg Hx   . Heart attack Neg Hx   . Breast cancer Neg Hx     SOCIAL HISTORY Social History   Tobacco Use  . Smoking status: Never Smoker  . Smokeless tobacco: Never Used  Substance Use Topics  . Alcohol use: Yes    Alcohol/week: 1.0 standard drink    Types: 1 Glasses of wine per week    Comment: 1 glass of wine 3x a week  . Drug use: No         OPHTHALMIC EXAM:  Base Eye Exam    Visual Acuity (ETDRS)      Right Left   Dist cc 20/30 +2 20/25 +2   Dist ph cc  20/25 +2    Correction: Glasses       Tonometry (Tonopen, 2:31 PM)      Right Left   Pressure 17 21       Pupils      Pupils Dark Light Shape React APD   Right PERRL 4 3 Round Brisk None   Left PERRL 4 3 Round Brisk None       Visual Fields (Counting fingers)      Left Right    Full Full       Extraocular Movement      Right Left    Full Full       Neuro/Psych    Oriented x3: Yes   Mood/Affect: Normal       Dilation   Defer per GAR        Slit Lamp and Fundus Exam    External Exam      Right Left   External Normal Normal       Slit Lamp Exam      Right Left   Lids/Lashes Normal Normal   Conjunctiva/Sclera White and quiet White and quiet   Cornea Clear Clear   Anterior Chamber Deep and quiet Deep and quiet   Iris Round and reactive Round and reactive   Lens Posterior chamber intraocular lens Posterior chamber intraocular lens   Anterior Vitreous Normal Normal          IMAGING AND PROCEDURES  Imaging and Procedures for 03/26/20           ASSESSMENT/PLAN:  Serous retinal detachment of left eye Condition has been resolved for some time with spontaneous resolution and the avoidance of antivegF in the past      ICD-10-CM   1. Glaucoma suspect of both eyes  H40.003   2. 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 Serous retinal detachment of left eye  H33.22     1.  2.  3.  Ophthalmic Meds Ordered this visit:  No orders of the defined types were placed in this encounter.      Return for As scheduled, DILATE OU, OCT.  Patient Instructions  Patient is notify the office promptly if new onset visual acuity difficulties    Explained to her the importance of using block, suspect notation in her diagnostic evaluation so that the clinical physician can follow-up in the next visit  to monitor things as intraocular pressure and physiologic cupping since there is an asymmetry of the physiologic cup  in the left eye versus the right eye which is one of the cardinal signs of potential low-tension glaucoma.  Splane the fact that over time that this is this finding in the left eye has not changed has been documented on previous EMR and now she can see this on my chart for the first time since we have been on, and epic my chart since April 2021  For the patient that I have been monitoring this over a series of years simply because of the asymmetric physiologic cupping which is a cardinal sign in some cases of potential low-tension glaucoma.  No sign of progression over time and thus it has not been a topic of concern in the past.  She has a clear understanding now that this continues to be monitored but no not a worrisome issue    Explained the diagnoses, plan, and follow up with the patient and they expressed understanding.  Patient expressed understanding of the importance of proper follow up care.   Clent Demark Yatziri Wainwright M.D. Diseases & Surgery of the Retina and Vitreous [facility]ina [facility] [place] 03/26/20     Abbreviations: M myopia (nearsighted); A astigmatism; H hyperopia (farsighted); P presbyopia; Mrx spectacle prescription;  CTL contact lenses; OD right eye; OS left eye; OU both eyes  XT exotropia; ET esotropia; PEK punctate epithelial keratitis; PEE punctate epithelial erosions; DES dry eye syndrome; MGD meibomian gland dysfunction; ATs artificial tears; PFAT's preservative free artificial tears; [place] nuclear sclerotic cataract; PSC posterior subcapsular cataract; ERM epi-retinal membrane; PVD posterior vitreous detachment; RD retinal detachment; DM diabetes mellitus; DR diabetic retinopathy; NPDR non-proliferative diabetic retinopathy; PDR proliferative diabetic retinopathy; CSME clinically significant macular edema; DME diabetic macular edema; dbh dot blot hemorrhages; CWS cotton wool spot; POAG primary open angle glaucoma; C/D cup-to-disc ratio; HVF humphrey visual field; GVF goldmann  visual field; OCT optical coherence tomography; IOP intraocular pressure; BRVO Branch retinal vein occlusion; CRVO central retinal vein occlusion; CRAO central retinal artery occlusion; BRAO branch retinal artery occlusion; RT retinal tear; SB scleral buckle; PPV pars plana vitrectomy; VH Vitreous hemorrhage; PRP panretinal laser photocoagulation; IVK intravitreal kenalog; VMT vitreomacular traction; MH Macular hole;  NVD neovascularization of the disc; NVE neovascularization elsewhere; AREDS age related eye disease study; ARMD age related macular degeneration; POAG primary open angle glaucoma; EBMD epithelial/anterior basement membrane dystrophy; ACIOL anterior chamber intraocular lens; IOL intraocular lens; PCIOL posterior chamber intraocular lens; Phaco/IOL phacoemulsification with intraocular lens placement; [place] photorefractive keratectomy; LASIK laser assisted in situ keratomileusis; HTN hypertension; DM diabetes mellitus; COPD chronic obstructive pulmonary disease

## 2020-03-26 NOTE — Assessment & Plan Note (Signed)
Condition has been resolved for some time with spontaneous resolution and the avoidance of antivegF in the past

## 2020-03-27 ENCOUNTER — Encounter: Payer: Self-pay | Admitting: Internal Medicine

## 2020-03-27 NOTE — Telephone Encounter (Signed)
Mast, Man X, NP  You 35 minutes ago (10:28 AM)   Faythe Ghee, can you check on her referral?    Message text        Message Forwarded to [facility]

## 2020-04-05 DIAGNOSIS — I10 Essential (primary) hypertension: Secondary | ICD-10-CM | POA: Diagnosis not present

## 2020-04-23 DIAGNOSIS — R69 Illness, unspecified: Secondary | ICD-10-CM | POA: Diagnosis not present

## 2020-04-24 ENCOUNTER — Other Ambulatory Visit (INDEPENDENT_AMBULATORY_CARE_PROVIDER_SITE_OTHER): Payer: Medicare HMO

## 2020-04-24 ENCOUNTER — Other Ambulatory Visit: Payer: Self-pay

## 2020-04-24 DIAGNOSIS — E039 Hypothyroidism, unspecified: Secondary | ICD-10-CM

## 2020-04-24 LAB — TSH: TSH: 2.93 u[IU]/mL (ref 0.35–4.50)

## 2020-04-24 LAB — T4, FREE: Free T4: 1.24 ng/dL (ref 0.60–1.60)

## 2020-04-25 DIAGNOSIS — I10 Essential (primary) hypertension: Secondary | ICD-10-CM | POA: Diagnosis not present

## 2020-04-25 NOTE — Telephone Encounter (Signed)
Message routed to PCP Gupta, Anjali L, MD . Please Advise.  

## 2020-05-02 DIAGNOSIS — I6529 Occlusion and stenosis of unspecified carotid artery: Secondary | ICD-10-CM | POA: Diagnosis not present

## 2020-05-02 DIAGNOSIS — I10 Essential (primary) hypertension: Secondary | ICD-10-CM | POA: Diagnosis not present

## 2020-05-16 DIAGNOSIS — R69 Illness, unspecified: Secondary | ICD-10-CM | POA: Diagnosis not present

## 2020-05-23 ENCOUNTER — Encounter (HOSPITAL_COMMUNITY): Payer: Medicare HMO

## 2020-06-14 ENCOUNTER — Other Ambulatory Visit: Payer: Self-pay

## 2020-06-14 ENCOUNTER — Telehealth: Payer: Self-pay | Admitting: Internal Medicine

## 2020-06-14 ENCOUNTER — Non-Acute Institutional Stay: Payer: Medicare HMO | Admitting: Nurse Practitioner

## 2020-06-14 DIAGNOSIS — E039 Hypothyroidism, unspecified: Secondary | ICD-10-CM | POA: Diagnosis not present

## 2020-06-14 DIAGNOSIS — F5101 Primary insomnia: Secondary | ICD-10-CM

## 2020-06-14 DIAGNOSIS — I480 Paroxysmal atrial fibrillation: Secondary | ICD-10-CM

## 2020-06-14 DIAGNOSIS — I10 Essential (primary) hypertension: Secondary | ICD-10-CM

## 2020-06-14 DIAGNOSIS — F339 Major depressive disorder, recurrent, unspecified: Secondary | ICD-10-CM

## 2020-06-14 DIAGNOSIS — R69 Illness, unspecified: Secondary | ICD-10-CM | POA: Diagnosis not present

## 2020-06-14 DIAGNOSIS — M810 Age-related osteoporosis without current pathological fracture: Secondary | ICD-10-CM | POA: Diagnosis not present

## 2020-06-14 DIAGNOSIS — E785 Hyperlipidemia, unspecified: Secondary | ICD-10-CM

## 2020-06-14 MED ORDER — LEVOTHYROXINE SODIUM 112 MCG PO TABS: 112.0000 ug | ORAL_TABLET | Freq: Every day | ORAL | 5 refills | Status: DC

## 2020-06-14 MED ORDER — LORAZEPAM 0.5 MG PO TABS: 0.5000 mg | ORAL_TABLET | Freq: Every evening | ORAL | 1 refills | Status: DC | PRN

## 2020-06-14 NOTE — Telephone Encounter (Signed)
Rx sent to pharmacy provided for 90 supply.

## 2020-06-14 NOTE — Assessment & Plan Note (Addendum)
HTN, the reported her blood pressure has been normal except when seeing doctors, takes Diltiazem, Irbesartan, ASA 81mg  qd, Bun/creat 20/0.78 8/0/21

## 2020-06-14 NOTE — Assessment & Plan Note (Addendum)
Hypothyroidism, takes Levothyroxine 112 mcg qd, TSH 2.93 04/24/20, f/u TSH 3 months.

## 2020-06-14 NOTE — Telephone Encounter (Signed)
-   Patient requesting refill for levothyroxine be sent to:  [place], [place] - [address]  [name] [address]  Phone:  [phone] Fax:  [phone]   - Patient requests a 90 day quantity rather than a 45 day.

## 2020-06-14 NOTE — Progress Notes (Signed)
Location:   clinic [place]   Place of Service:  Clinic (12)clinic [facility] Provider: Marlana Latus NP  Code Status: DNR Goals of Care: [place] 05/06/2019  Does Patient Have a Medical Advance Directive? Yes  Type of Advance Directive [place]  Does patient want to make changes to medical advance directive? No - Patient declined  Copy of [place] in Chart? Yes - validated most recent copy scanned in chart (See row information)  Would patient like information on creating a medical advance directive? -     Chief Complaint  Patient presents with   Medical Management of Chronic Issues    Patient returns to clinic for follow up. She has no concerns.     HPI: Patient is a 79 y.o. female seen today for medical management of chronic diseases.    HTN, takes Diltiazem, Irbesartan, ASA 81mg  qd, Bun/creat 20/0.78 8/0/21             Anxiety,  prn Lorazepam.              Hypothyroidism, takes Levothyroxine 112 mcg qd, TSH 2.93 04/24/20             Hyperlipidemia, diet, exercise, LDL 173 03/12/20             OP due DEXA, takes Vit D             Afib, takes Diltiazem, ASA  Past Medical History:  Diagnosis Date   Atrial fibrillation ([place])    echo 12/23/10 EF= >55%, stress myoview 01/30/11 normal pattern of perfusion in all myocardial regions   Carotid artery stenosis    Per [facility] new patient packet    Chicken pox    Colon polyp    Colon polyp    Dyslipidemia    Heart murmur    Hiatal hernia    Hypothyroidism    Laryngopharyngeal reflux    Osteopenia    Scoliosis    Seasonal allergies    Thyroid disease    Vitamin D deficiency     Past Surgical History:  Procedure Laterality Date   BREAST CYST EXCISION Right 1988   ESOPHAGEAL MANOMETRY N/A 08/29/2015   Procedure: ESOPHAGEAL MANOMETRY (EM);  Surgeon: Manus Gunning, MD;  Location: [facility];  Service: Gastroenterology;  Laterality: N/A;   EYE SURGERY     Cataract  eye surgery-right 06/2014, left 04/2014   TONSILLECTOMY     Childhood    Allergies  Allergen Reactions   Amoxicillin Swelling   Flonase [Fluticasone Propionate] Swelling    Swelling of throat per patient Swelling of throat per patient   Sulfa Antibiotics Other (See Comments)    Upset stomach   Fluticasone Propionate Swelling    Swelling of throat per patient   Sulfasalazine Other (See Comments)    Upset stomach    Allergies as of 06/14/2020      Reactions   Amoxicillin Swelling   Flonase [fluticasone Propionate] Swelling   Swelling of throat per patient Swelling of throat per patient   Sulfa Antibiotics Other (See Comments)   Upset stomach   Fluticasone Propionate Swelling   Swelling of throat per patient   Sulfasalazine Other (See Comments)   Upset stomach      Medication List       Accurate as of June 14, 2020 11:59 PM. If you have any questions, ask your nurse or doctor.        aspirin 81 MG tablet Take  81 mg by mouth daily.   diltiazem 120 MG 24 hr capsule Commonly known as: Cartia XT Take 1 capsule (120 mg total) by mouth at bedtime.   irbesartan 300 MG tablet Commonly known as: Avapro Take 0.5 tablets (150 mg total) by mouth daily.   levothyroxine 112 MCG tablet Commonly known as: SYNTHROID Take 1 tablet (112 mcg total) by mouth daily before breakfast.   LORazepam 0.5 MG tablet Commonly known as: ATIVAN Take 1 tablet (0.5 mg total) by mouth at bedtime as needed for anxiety.   multivitamin capsule Take 1 capsule by mouth daily.   PRESERVISION AREDS PO Take 1 tablet by mouth daily.   Vitamin D-3 25 MCG (1000 UT) Caps Take 1,000 Units by mouth 2 (two) times daily.       Review of Systems:  Review of Systems  Constitutional: Negative for fatigue, fever and unexpected weight change.  HENT: Negative for congestion, trouble swallowing and voice change.   Eyes: Negative for visual disturbance.  Respiratory: Negative for cough and  shortness of breath.   Cardiovascular: Positive for leg swelling. Negative for chest pain and palpitations.  Gastrointestinal: Positive for constipation. Negative for abdominal distention, abdominal pain and vomiting.       Occasional, on diet.   Genitourinary: Negative for difficulty urinating, dysuria and urgency.  Musculoskeletal: Negative for back pain and gait problem.  Skin: Negative for color change.  Neurological: Negative for speech difficulty, weakness, light-headedness and headaches.  Psychiatric/Behavioral: Positive for sleep disturbance. Negative for behavioral problems. The patient is nervous/anxious.     Health Maintenance  Topic Date Due   PNA vac Low Risk Adult (1 of 2 - PCV13) 06/14/2021 (Originally 10/06/2004)   TETANUS/TDAP  08/04/2020   INFLUENZA VACCINE  Completed   COVID-19 Vaccine  Completed   DEXA SCAN  Discontinued    Physical Exam: Vitals:   06/14/20 1510  BP: (!) 158/100  Pulse: 78  Temp: 98.2 F (36.8 C)  SpO2: 94%  Weight: 131 lb 3.2 oz (59.5 kg)  Height: 5\' 6"  (1.676 m)   Body mass index is 21.18 kg/m. Physical Exam Vitals and nursing note reviewed.  Constitutional:      Appearance: Normal appearance.  HENT:     Head: Normocephalic and atraumatic.     Mouth/Throat:     Mouth: Mucous membranes are moist.  Eyes:     Extraocular Movements: Extraocular movements intact.     Conjunctiva/sclera: Conjunctivae normal.     Pupils: Pupils are equal, round, and reactive to light.     Comments: Right lower subconjunctival hemorrhage.   Cardiovascular:     Rate and Rhythm: Normal rate and regular rhythm.     Heart sounds: Murmur heard.   Pulmonary:     Effort: Pulmonary effort is normal.     Breath sounds: Normal breath sounds. No rales.  Abdominal:     General: Bowel sounds are normal.     Palpations: Abdomen is soft.     Tenderness: There is no abdominal tenderness.  Genitourinary:    Comments: Declined to undress herself for  examination.  Musculoskeletal:     Cervical back: Normal range of motion and neck supple.     Right lower leg: Edema present.     Left lower leg: Edema present.     Comments: Trace BLE, TED  Skin:    General: Skin is warm and dry.  Neurological:     General: No focal deficit present.     Mental Status: She  is alert and oriented to person, place, and time. Mental status is at baseline.     Gait: Gait normal.  Psychiatric:        Mood and Affect: Mood normal.        Behavior: Behavior normal.        Thought Content: Thought content normal.        Judgment: Judgment normal.     Comments: Anxious, racing speech     Labs reviewed: Basic Metabolic Panel: Recent Labs    03/12/20 1424 03/20/20 1603 04/24/20 1421  NA 135  --   --   K 3.9  --   --   CL 100  --   --   CO2 29  --   --   GLUCOSE 77  --   --   BUN 20  --   --   CREATININE 0.78  --   --   CALCIUM 9.4  --   --   TSH 9.36* 8.87* 2.93   Liver Function Tests: Recent Labs    03/12/20 1424  AST 20  ALT 17  BILITOT 0.8  PROT 6.4   No results for input(s): LIPASE, AMYLASE in the last 8760 hours. No results for input(s): AMMONIA in the last 8760 hours. CBC: Recent Labs    03/12/20 1424  WBC 3.7*  NEUTROABS 1,987  HGB 14.0  HCT 41.9  MCV 100.0  PLT 143   Lipid Panel: Recent Labs    03/12/20 1424  CHOL 272*  HDL 83  LDLCALC 173*  TRIG 64  CHOLHDL 3.3   No results found for: HGBA1C  Procedures since last visit: No results found.  Assessment/Plan  Atrial fibrillation (HCC) Afib, takes Diltiazem, ASA. One episode 12/2010, started Diltiazem, took Coumadin for a while due to intolerance, then ASA 81mg , f/u Cardiology, had echocardiogram   Osteoporosis OP due DEXA-no further testing, takes Vit D. DEXA 08/2014, pt declined medication.    Dyslipidemia Hyperlipidemia, diet, exercise, LDL 173 03/12/20   Hypothyroidism Hypothyroidism, takes Levothyroxine 112 mcg qd, TSH 2.93 04/24/20, f/u TSH 3 months.     Primary insomnia  Anxiety,  prn Lorazepam.    Hypertension HTN, the reported her blood pressure has been normal except when seeing doctors, takes Diltiazem, Irbesartan, ASA 81mg  qd, Bun/creat 20/0.78 8/0/21    Labs/tests ordered:  TSH 3 months   Next appt:  F/u in clinic 6 months

## 2020-06-14 NOTE — Assessment & Plan Note (Signed)
Hyperlipidemia, diet, exercise, LDL 173 03/12/20

## 2020-06-14 NOTE — Assessment & Plan Note (Addendum)
Afib, takes Diltiazem, ASA. One episode 12/2010, started Diltiazem, took Coumadin for a while due to intolerance, then ASA 81mg , f/u Cardiology, had echocardiogram

## 2020-06-14 NOTE — Assessment & Plan Note (Addendum)
OP due DEXA-no further testing, takes Vit D. DEXA 08/2014, pt declined medication.

## 2020-06-14 NOTE — Assessment & Plan Note (Signed)
Anxiety, prn Lorazepam.   

## 2020-06-15 ENCOUNTER — Encounter: Payer: Self-pay | Admitting: Nurse Practitioner

## 2020-09-04 ENCOUNTER — Encounter: Payer: Medicare HMO | Admitting: Nurse Practitioner

## 2020-09-12 DIAGNOSIS — E039 Hypothyroidism, unspecified: Secondary | ICD-10-CM | POA: Diagnosis not present

## 2020-09-13 ENCOUNTER — Other Ambulatory Visit: Payer: Self-pay

## 2020-09-13 DIAGNOSIS — E039 Hypothyroidism, unspecified: Secondary | ICD-10-CM

## 2020-09-14 ENCOUNTER — Other Ambulatory Visit: Payer: Self-pay | Admitting: Nurse Practitioner

## 2020-09-14 ENCOUNTER — Encounter: Payer: Self-pay | Admitting: Internal Medicine

## 2020-09-14 DIAGNOSIS — E039 Hypothyroidism, unspecified: Secondary | ICD-10-CM

## 2020-09-14 LAB — T3, FREE: T3, Free: 2.7 pg/mL (ref 2.3–4.2)

## 2020-09-14 LAB — TSH: TSH: 5.31 mIU/L — ABNORMAL HIGH (ref 0.40–4.50)

## 2020-09-18 ENCOUNTER — Encounter: Payer: Self-pay | Admitting: Internal Medicine

## 2020-09-19 ENCOUNTER — Other Ambulatory Visit: Payer: Self-pay | Admitting: Internal Medicine

## 2020-09-19 DIAGNOSIS — E039 Hypothyroidism, unspecified: Secondary | ICD-10-CM

## 2020-09-30 ENCOUNTER — Emergency Department (HOSPITAL_COMMUNITY)
Admission: EM | Admit: 2020-09-30 | Discharge: 2020-09-30 | Disposition: A | Discharge status: Home / Self Care | Payer: Medicare HMO | Attending: Emergency Medicine | Admitting: Emergency Medicine

## 2020-09-30 ENCOUNTER — Encounter (HOSPITAL_COMMUNITY): Payer: Self-pay | Admitting: Emergency Medicine

## 2020-09-30 DIAGNOSIS — R197 Diarrhea, unspecified: Secondary | ICD-10-CM | POA: Diagnosis not present

## 2020-09-30 DIAGNOSIS — Z79899 Other long term (current) drug therapy: Secondary | ICD-10-CM | POA: Insufficient documentation

## 2020-09-30 DIAGNOSIS — Z7982 Long term (current) use of aspirin: Secondary | ICD-10-CM | POA: Diagnosis not present

## 2020-09-30 DIAGNOSIS — K5900 Constipation, unspecified: Secondary | ICD-10-CM

## 2020-09-30 DIAGNOSIS — R339 Retention of urine, unspecified: Secondary | ICD-10-CM | POA: Diagnosis not present

## 2020-09-30 DIAGNOSIS — I10 Essential (primary) hypertension: Secondary | ICD-10-CM | POA: Insufficient documentation

## 2020-09-30 DIAGNOSIS — E039 Hypothyroidism, unspecified: Secondary | ICD-10-CM | POA: Diagnosis not present

## 2020-09-30 DIAGNOSIS — E860 Dehydration: Secondary | ICD-10-CM | POA: Diagnosis not present

## 2020-09-30 LAB — CBC WITH DIFFERENTIAL/PLATELET
Abs Immature Granulocytes: 0.02 10*3/uL (ref 0.00–0.07)
Basophils Absolute: 0 10*3/uL (ref 0.0–0.1)
Basophils Relative: 0 %
Eosinophils Absolute: 0 10*3/uL (ref 0.0–0.5)
Eosinophils Relative: 0 %
HCT: 45.1 % (ref 36.0–46.0)
Hemoglobin: 15.3 g/dL — ABNORMAL HIGH (ref 12.0–15.0)
Immature Granulocytes: 0 %
Lymphocytes Relative: 14 %
Lymphs Abs: 0.9 10*3/uL (ref 0.7–4.0)
MCH: 33.1 pg (ref 26.0–34.0)
MCHC: 33.9 g/dL (ref 30.0–36.0)
MCV: 97.6 fL (ref 80.0–100.0)
Monocytes Absolute: 0.6 10*3/uL (ref 0.1–1.0)
Monocytes Relative: 9 %
Neutro Abs: 4.8 10*3/uL (ref 1.7–7.7)
Neutrophils Relative %: 77 %
Platelets: 153 10*3/uL (ref 150–400)
RBC: 4.62 MIL/uL (ref 3.87–5.11)
RDW: 12.1 % (ref 11.5–15.5)
WBC: 6.3 10*3/uL (ref 4.0–10.5)
nRBC: 0 % (ref 0.0–0.2)

## 2020-09-30 LAB — URINALYSIS, ROUTINE W REFLEX MICROSCOPIC
Bilirubin Urine: NEGATIVE
Glucose, UA: NEGATIVE mg/dL
Hgb urine dipstick: NEGATIVE
Ketones, ur: NEGATIVE mg/dL
Leukocytes,Ua: NEGATIVE
Nitrite: NEGATIVE
Protein, ur: NEGATIVE mg/dL
Specific Gravity, Urine: 1.006 (ref 1.005–1.030)
pH: 7 (ref 5.0–8.0)

## 2020-09-30 LAB — BASIC METABOLIC PANEL
Anion gap: 12 (ref 5–15)
BUN: 15 mg/dL (ref 8–23)
CO2: 26 mmol/L (ref 22–32)
Calcium: 9.7 mg/dL (ref 8.9–10.3)
Chloride: 94 mmol/L — ABNORMAL LOW (ref 98–111)
Creatinine, Ser: 0.71 mg/dL (ref 0.44–1.00)
GFR, Estimated: >60 mL/min (ref >60)
Glucose, Bld: 145 mg/dL — ABNORMAL HIGH (ref 70–99)
Potassium: 4.5 mmol/L (ref 3.5–5.1)
Sodium: 132 mmol/L — ABNORMAL LOW (ref 135–145)

## 2020-09-30 NOTE — ED Triage Notes (Signed)
Patient here from home reporting urinary retention. Last urinated at 6am this morning. Denies flank pain, n/v.

## 2020-09-30 NOTE — ED Notes (Signed)
577 ml with bladder scan

## 2020-09-30 NOTE — Discharge Instructions (Addendum)
You may have been mildly dehydrated causing decreased ability to urinate.  Also, constipation can make it difficult to urinate at times.  Make sure you are drinking plenty of fluids and eat a high-fiber diet to improve constipation.  You may need to take Colace, 100 mg, twice a day to help for stooling purposes.  Also make sure you continue to take your blood pressure medicine as directed.  Stay on a low-salt diet to improve your blood pressure.  See your doctor for checkup in 1 week.

## 2020-09-30 NOTE — ED Provider Notes (Signed)
[place] DEPT Provider Note   CSN: [id number] Arrival date & time: 09/30/20  1516     History Chief Complaint  Patient presents with  . Urinary Retention    Stacy [name] is a 80 y.o. female.  HPI Patient is concerned that she has not yet urinated today. She states she drinks 6 cups of tea and still has not urinated. She had an episode of diarrhea several days ago but none since. She denies vomiting, dizziness, fever, chills, cough, shortness of breath or chest pain. She has not previously had problems like this. There are no other known modifying factor factors    Past Medical History:  Diagnosis Date  . Atrial fibrillation ([place])    echo 12/23/10 EF= >55%, stress myoview 01/30/11 normal pattern of perfusion in all myocardial regions  . Carotid artery stenosis    Per [facility] new patient packet   . Chicken pox   . Colon polyp   . Colon polyp   . Dyslipidemia   . Heart murmur   . Hiatal hernia   . Hypothyroidism   . Laryngopharyngeal reflux   . Osteopenia   . Scoliosis   . Seasonal allergies   . Thyroid disease   . Vitamin D deficiency     Patient Active Problem List   Diagnosis Date Noted  . Anxiety 03/08/2020  . 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 Glaucoma suspect of both eyes 02/22/2020  . Serous retinal detachment, unspecified eye 02/22/2020  . Serous retinal detachment of left eye 02/22/2020  . Weight loss 02/26/2019  . Hypertension 02/26/2019  . Carotid artery stenosis, asymptomatic, right 05/31/2018  . Abnormal carotid pulse 05/18/2018  . Primary insomnia 06/15/2017  . Osteoporosis 08/24/2016  . Dysphagia   . Hiatal hernia 06/08/2015  . Aortic insufficiency 05/15/2014  . Edema 05/06/2013  . Hypothyroidism   . Colon polyp   . Vitamin D deficiency   . Dyslipidemia   . Atrial fibrillation [facility])     Past Surgical History:  Procedure Laterality Date  . BREAST CYST EXCISION  Right 1988  . ESOPHAGEAL MANOMETRY N/A 08/29/2015   Procedure: ESOPHAGEAL MANOMETRY (EM);  Surgeon: Manus Gunning, MD;  Location: [facility];  Service: Gastroenterology;  Laterality: N/A;  . EYE SURGERY     Cataract eye surgery-right 06/2014, left 04/2014  . TONSILLECTOMY     Childhood     OB History   No obstetric history on file.     Family History  Problem Relation Age of Onset  . CVA Mother   . Hyperlipidemia Mother   . Hypertension Mother   . Anuerysm Father        brain  . Stroke Maternal Grandmother   . Hypertension Maternal Grandmother   . Hypertension Sister   . Scoliosis Sister   . Colon cancer Neg Hx   . Heart attack Neg Hx   . Breast cancer Neg Hx     Social History   Tobacco Use  . Smoking status: Never Smoker  . Smokeless tobacco: Never Used  Substance Use Topics  . Alcohol use: Yes    Alcohol/week: 1.0 standard drink    Types: 1 Glasses of wine per week    Comment: 1 glass of wine 3x a week  . Drug use: No    Home Medications Prior to Admission medications   Medication Sig Start Date End Date Taking? Authorizing Provider  aspirin 81 MG tablet Take 81 mg by  mouth daily.   Yes [provider]  Cholecalciferol (VITAMIN D-3) 1000 UNITS CAPS Take 1,000 Units by mouth 2 (two) times daily.    Yes [provider]  diltiazem (CARTIA XT) 120 MG 24 hr capsule Take 1 capsule (120 mg total) by mouth at bedtime. 03/19/20  Yes Jettie Booze, MD  irbesartan (AVAPRO) 150 MG tablet Take 150 mg by mouth daily.   Yes [provider]  levothyroxine (SYNTHROID) 112 MCG tablet Take 1 tablet (112 mcg total) by mouth daily before breakfast. 06/14/20  Yes Philemon Kingdom, MD  LORazepam (ATIVAN) 0.5 MG tablet Take 1 tablet (0.5 mg total) by mouth at bedtime as needed for anxiety. 06/14/20  Yes Mast, Man X, NP  Multiple Vitamin (MULTIVITAMIN) capsule Take 1 capsule by mouth daily.   Yes [provider]  Multiple  Vitamins-Minerals (PRESERVISION AREDS PO) Take 1 tablet by mouth daily.   Yes [provider]  irbesartan (AVAPRO) 300 MG tablet Take 0.5 tablets (150 mg total) by mouth daily. Patient not taking: No sig reported 12/20/19   Jettie Booze, MD    Allergies    Amoxicillin, Flonase [fluticasone propionate], Sulfa antibiotics, and Sulfasalazine  Review of Systems   Review of Systems  All other systems reviewed and are negative.   Physical Exam Updated Vital Signs BP (!) 185/90 (BP Location: Left Arm)   Pulse 68   Temp 97.9 F (36.6 C) (Oral)   Resp 16   SpO2 95%   Physical Exam Vitals and nursing note reviewed.  Constitutional:      General: She is not in acute distress.    Appearance: She is well-developed and well-nourished. She is not ill-appearing or diaphoretic.  HENT:     Head: Normocephalic and atraumatic.     Right Ear: External ear normal.     Left Ear: External ear normal.  Eyes:     Extraocular Movements: EOM normal.     Conjunctiva/sclera: Conjunctivae normal.     Pupils: Pupils are equal, round, and reactive to light.  Neck:     Trachea: Phonation normal.  Cardiovascular:     Rate and Rhythm: Normal rate and regular rhythm.     Heart sounds: Normal heart sounds.  Pulmonary:     Effort: Pulmonary effort is normal.     Breath sounds: Normal breath sounds.  Chest:     Chest wall: No bony tenderness.  Abdominal:     General: There is no distension.     Palpations: Abdomen is soft.     Tenderness: There is no abdominal tenderness.  Musculoskeletal:        General: Normal range of motion.     Cervical back: Normal range of motion and neck supple.  Skin:    General: Skin is warm, dry and intact.  Neurological:     Mental Status: She is alert and oriented to person, place, and time.     Cranial Nerves: No cranial nerve deficit.     Sensory: No sensory deficit.     Motor: No abnormal muscle tone.     Coordination: Coordination normal.   Psychiatric:        Mood and Affect: Mood and affect and mood normal.        Behavior: Behavior normal.        Thought Content: Thought content normal.        Judgment: Judgment normal.     ED Results / Procedures / Treatments   Labs (all labs  ordered are listed, but only abnormal results are displayed) Labs Reviewed  URINALYSIS, ROUTINE W REFLEX MICROSCOPIC - Abnormal; Notable for the following components:      Result Value   Color, Urine STRAW (*)    All other components within normal limits  BASIC METABOLIC PANEL - Abnormal; Notable for the following components:   Sodium 132 (*)    Chloride 94 (*)    Glucose, Bld 145 (*)    All other components within normal limits  CBC WITH DIFFERENTIAL/PLATELET - Abnormal; Notable for the following components:   Hemoglobin 15.3 (*)    All other components within normal limits  URINE CULTURE    EKG None  Radiology No results found.  Procedures Procedures   Medications Ordered in ED Medications - No data to display  ED Course  I have reviewed the triage vital signs and the nursing notes.  Pertinent labs & imaging results that were available during my care of the patient were reviewed by me and considered in my medical decision making (see chart for details).    MDM Rules/Calculators/A&P                           Patient Vitals for the past 24 hrs:  BP Temp Temp src Pulse Resp SpO2  09/30/20 1913 (!) 185/90 -- -- 68 16 95 %  09/30/20 1800 (!) 202/92 -- -- 75 16 97 %  09/30/20 1745 -- -- -- -- 16 --  09/30/20 1745 (!) 214/166 -- -- 98 -- 99 %  09/30/20 1730 (!) 194/91 -- -- 60 -- 90 %  09/30/20 1649 (!) 217/104 -- -- 77 16 100 %  09/30/20 1525 (!) 172/99 97.9 F (36.6 C) Oral 88 18 93 %    7:17 PM Reevaluation with update and discussion. After initial assessment and treatment, an updated evaluation reveals patient is now spontaneously voided 600 cc of yellow urine.  She states that she feels better.  She now reports  that she is constipated.  I offered her an enema and she declined.  Findings discussed and questions answered. Daleen Bo   Medical Decision Making:  This patient is presenting for evaluation of decreased urination for 8 hours, which does require a range of treatment options, and is a complaint that involves a moderate risk of morbidity and mortality. The differential diagnoses include bladder outlet obstruction, decreased renal perfusion, nonspecific malaise. I decided to review old records, and in summary elderly female with brief period of decreased urination.  I did not require additional historical information from anyone.  Clinical Laboratory Tests Ordered, included CBC, Metabolic panel and Urinalysis. Review indicates normal except mild decrease sodium, decreased chloride, elevated glucose.  Creatinine and BUN are normal.     Critical Interventions-clinical evaluation, screening for urinary retention, laboratory testing, oral fluid trial, observation and reassess  After These Interventions, the Patient was reevaluated and was found to be able to spontaneously void.  Mild incidental hypertension, currently being treated.  Patient reports constipation may be contributing to sensation of difficulty voiding, and discomfort.  Doubt hypertensive urgency, metabolic disorder or impending vascular collapse.  Patient instructed on symptomatic treatment and follow-up plans.  CRITICAL CARE-no Performed by: Daleen Bo  Nursing Notes Reviewed/ Care Coordinated Applicable Imaging Reviewed Interpretation of Laboratory Data incorporated into ED treatment  The patient appears reasonably screened and/or stabilized for discharge and I doubt any other medical condition or other [facility] requiring further screening, evaluation, or treatment  in the ED at this time prior to discharge.  Plan: Home Medications-continue usual, try stool softener for constipation; Home Treatments-high-fiber diet, rest, fluids;  return here if the recommended treatment, does not improve the symptoms; Recommended follow up-PCP follow-up 1 week and as needed     Final Clinical Impression(s) / ED Diagnoses Final diagnoses:  Dehydration  Constipation, unspecified constipation type  Hypertension, unspecified type    Rx / DC Orders ED Discharge Orders    None       Daleen Bo, MD 09/30/20 [phone]

## 2020-09-30 NOTE — ED Notes (Signed)
Pt able to drink with no complications.

## 2020-09-30 NOTE — ED Notes (Signed)
Bladder scan 66ml

## 2020-10-02 LAB — URINE CULTURE: Culture: <10000 — AB

## 2020-10-15 DIAGNOSIS — K5900 Constipation, unspecified: Secondary | ICD-10-CM | POA: Diagnosis not present

## 2020-10-15 DIAGNOSIS — I10 Essential (primary) hypertension: Secondary | ICD-10-CM | POA: Diagnosis not present

## 2020-11-07 DIAGNOSIS — K5900 Constipation, unspecified: Secondary | ICD-10-CM | POA: Diagnosis not present

## 2020-12-06 ENCOUNTER — Encounter: Payer: Self-pay | Admitting: Nurse Practitioner

## 2020-12-07 ENCOUNTER — Encounter: Payer: Self-pay | Admitting: Internal Medicine

## 2020-12-13 ENCOUNTER — Non-Acute Institutional Stay: Payer: Medicare HMO | Admitting: Nurse Practitioner

## 2020-12-13 ENCOUNTER — Encounter: Payer: Self-pay | Admitting: Nurse Practitioner

## 2020-12-13 ENCOUNTER — Other Ambulatory Visit: Payer: Self-pay

## 2020-12-13 DIAGNOSIS — E785 Hyperlipidemia, unspecified: Secondary | ICD-10-CM

## 2020-12-13 DIAGNOSIS — I10 Essential (primary) hypertension: Secondary | ICD-10-CM | POA: Diagnosis not present

## 2020-12-13 DIAGNOSIS — I480 Paroxysmal atrial fibrillation: Secondary | ICD-10-CM

## 2020-12-13 DIAGNOSIS — M810 Age-related osteoporosis without current pathological fracture: Secondary | ICD-10-CM

## 2020-12-13 DIAGNOSIS — F419 Anxiety disorder, unspecified: Secondary | ICD-10-CM | POA: Diagnosis not present

## 2020-12-13 DIAGNOSIS — E039 Hypothyroidism, unspecified: Secondary | ICD-10-CM

## 2020-12-13 DIAGNOSIS — R69 Illness, unspecified: Secondary | ICD-10-CM | POA: Diagnosis not present

## 2020-12-13 MED ORDER — LORAZEPAM 0.5 MG PO TABS: 0.5000 mg | ORAL_TABLET | Freq: Two times a day (BID) | ORAL | 2 refills | Status: DC | PRN

## 2020-12-13 NOTE — Assessment & Plan Note (Signed)
OP 10/01/20 DEXA, t score -2.7,  takes Vit D, declined in the past.

## 2020-12-13 NOTE — Assessment & Plan Note (Signed)
takes Levothyroxine 112 mcg qd, TSH 5.31 f/u endocrinology.

## 2020-12-13 NOTE — Assessment & Plan Note (Addendum)
Escalated in the past 2-3 weeks, declined anxiolytics, declined labs, denied headache, change of vision, chest pain, palpitation, cough, SOB, abd pain, dysuria, will have prn Lorazepam 0.5mg  bid available to her. B vs R of Lorazepam explained to the patient.

## 2020-12-13 NOTE — Assessment & Plan Note (Signed)
diet, exercise, LDL 173 03/12/20 

## 2020-12-13 NOTE — Progress Notes (Signed)
Location:   clinic [facility]   Place of Service:  Clinic (12) Provider: Marlana Latus NP  Code Status: DNR Goals of Care: [place] 09/30/2020  Does Patient Have a Medical Advance Directive? No  Type of Advance Directive -  Does patient want to make changes to medical advance directive? -  Copy of [place] in Chart? -  Would patient like information on creating a medical advance directive? -     Chief Complaint  Patient presents with  . Medical Management of Chronic Issues    Patient presents for medication review.Patient here for refill of lorazepam. Patient states she would like a referral to a therapist.     HPI: Patient is a 80 y.o. female seen today for medical management of chronic diseases.       HTN, takes Diltiazem, Irbesartan, ASA 81mg  qd, Bun/creat 15/0.71 09/30/20 Anxiety, prn Lorazepam.  Hypothyroidism, takes Levothyroxine 112 mcg qd, TSH 5.31 f/u endocrinology.  Hyperlipidemia, diet, exercise, LDL 173 03/12/20 OP 10/01/20 DEXA, t score -2.7,  takes Vit D Afib, takes Diltiazem, ASA Past Medical History:  Diagnosis Date  . Atrial fibrillation ([place])    echo 12/23/10 EF= >55%, stress myoview 01/30/11 normal pattern of perfusion in all myocardial regions  . Carotid artery stenosis    Per [facility] new patient packet   . Chicken pox   . Colon polyp   . Colon polyp   . Dyslipidemia   . Heart murmur   . Hiatal hernia   . Hypothyroidism   . Laryngopharyngeal reflux   . Osteopenia   . Scoliosis   . Seasonal allergies   . Thyroid disease   . Vitamin D deficiency     Past Surgical History:  Procedure Laterality Date  . BREAST CYST EXCISION Right 1988  . ESOPHAGEAL MANOMETRY N/A 08/29/2015   Procedure: ESOPHAGEAL MANOMETRY (EM);  Surgeon: Manus Gunning, MD;  Location: [facility];  Service: Gastroenterology;  Laterality: N/A;  . EYE SURGERY     Cataract eye surgery-right  06/2014, left 04/2014  . TONSILLECTOMY     Childhood    Allergies  Allergen Reactions  . Amoxicillin Swelling  . Flonase [Fluticasone Propionate] Swelling    Swelling of throat per patient   . Sulfa Antibiotics Other (See Comments)    Upset stomach  . Sulfasalazine Other (See Comments)    Upset stomach    Allergies as of 12/13/2020      Reactions   Amoxicillin Swelling   Flonase [fluticasone Propionate] Swelling   Swelling of throat per patient   Sulfa Antibiotics Other (See Comments)   Upset stomach   Sulfasalazine Other (See Comments)   Upset stomach      Medication List       Accurate as of Dec 13, 2020 11:59 PM. If you have any questions, ask your nurse or doctor.        aspirin 81 MG tablet Take 81 mg by mouth daily.   diltiazem 120 MG 24 hr capsule Commonly known as: Cartia XT Take 1 capsule (120 mg total) by mouth at bedtime.   irbesartan 150 MG tablet Commonly known as: AVAPRO Take 150 mg by mouth daily.   irbesartan 300 MG tablet Commonly known as: Avapro Take 0.5 tablets (150 mg total) by mouth daily.   levothyroxine 112 MCG tablet Commonly known as: SYNTHROID Take 1 tablet (112 mcg total) by mouth daily before breakfast.   LORazepam 0.5 MG tablet Commonly known as: ATIVAN Take 1  tablet (0.5 mg total) by mouth 2 (two) times daily as needed for anxiety. What changed: when to take this Changed by: Jaron Czarnecki X Ellanor Feuerstein, NP   multivitamin capsule Take 1 capsule by mouth daily.   PRESERVISION AREDS PO Take 1 tablet by mouth daily.   Vitamin D-3 25 MCG (1000 UT) Caps Take 1,000 Units by mouth 2 (two) times daily.       Review of Systems:  Review of Systems  Constitutional: Negative for fatigue, fever and unexpected weight change.  HENT: Negative for congestion, trouble swallowing and voice change.   Eyes: Negative for visual disturbance.  Respiratory: Negative for cough and shortness of breath.   Cardiovascular: Positive for leg swelling. Negative  for chest pain and palpitations.  Gastrointestinal: Positive for constipation. Negative for abdominal pain and vomiting.       Occasional, on diet.   Genitourinary: Negative for difficulty urinating, dysuria and urgency.  Musculoskeletal: Negative for back pain and gait problem.  Skin: Negative for color change.  Neurological: Negative for speech difficulty, weakness, light-headedness and headaches.  Psychiatric/Behavioral: Positive for sleep disturbance. Negative for behavioral problems. The patient is nervous/anxious.     Health Maintenance  Topic Date Due  . TETANUS/TDAP  08/04/2020  . PNA vac Low Risk Adult (1 of 2 - PCV13) 06/14/2021 (Originally 10/06/2004)  . INFLUENZA VACCINE  03/04/2021  . COVID-19 Vaccine  Completed  . HPV VACCINES  Aged Out  . DEXA SCAN  Discontinued    Physical Exam: Vitals:   12/13/20 1542  BP: (!) 176/98  Pulse: 80  Resp: 18  Temp: 98 F (36.7 C)  TempSrc: Temporal  SpO2: 96%  Weight: 130 lb 6.4 oz (59.1 kg)  Height: 5\' 6"  (1.676 m)   Body mass index is 21.05 kg/m. Physical Exam Vitals and nursing note reviewed.  Constitutional:      Appearance: Normal appearance.  HENT:     Head: Normocephalic and atraumatic.     Mouth/Throat:     Mouth: Mucous membranes are moist.  Eyes:     Extraocular Movements: Extraocular movements intact.     Conjunctiva/sclera: Conjunctivae normal.     Pupils: Pupils are equal, round, and reactive to light.  Cardiovascular:     Rate and Rhythm: Normal rate and regular rhythm.     Heart sounds: Murmur heard.    Pulmonary:     Effort: Pulmonary effort is normal.     Breath sounds: Normal breath sounds. No rales.  Abdominal:     General: Bowel sounds are normal.     Palpations: Abdomen is soft.     Tenderness: There is no abdominal tenderness.  Genitourinary:    Comments: Declined to undress herself for examination.  Musculoskeletal:     Cervical back: Normal range of motion and neck supple.     Right  lower leg: Edema present.     Left lower leg: Edema present.     Comments: Trace BLE, TED  Skin:    General: Skin is warm and dry.  Neurological:     General: No focal deficit present.     Mental Status: She is alert and oriented to person, place, and time. Mental status is at baseline.     Gait: Gait normal.  Psychiatric:        Mood and Affect: Mood normal.        Behavior: Behavior normal.        Thought Content: Thought content normal.  Judgment: Judgment normal.     Comments: Anxious, racing speech     Labs reviewed: Basic Metabolic Panel: Recent Labs    03/12/20 1424 03/20/20 1603 04/24/20 1421 09/12/20 1027 09/30/20 1710  NA 135  --   --   --  132*  K 3.9  --   --   --  4.5  CL 100  --   --   --  94*  CO2 29  --   --   --  26  GLUCOSE 77  --   --   --  145*  BUN 20  --   --   --  15  CREATININE 0.78  --   --   --  0.71  CALCIUM 9.4  --   --   --  9.7  TSH 9.36* 8.87* 2.93 5.31*  --    Liver Function Tests: Recent Labs    03/12/20 1424  AST 20  ALT 17  BILITOT 0.8  PROT 6.4   No results for input(s): LIPASE, AMYLASE in the last 8760 hours. No results for input(s): AMMONIA in the last 8760 hours. CBC: Recent Labs    03/12/20 1424 09/30/20 1710  WBC 3.7* 6.3  NEUTROABS 1,987 4.8  HGB 14.0 15.3*  HCT 41.9 45.1  MCV 100.0 97.6  PLT 143 153   Lipid Panel: Recent Labs    03/12/20 1424  CHOL 272*  HDL 83  LDLCALC 173*  TRIG 64  CHOLHDL 3.3   No results found for: HGBA1C  Procedures since last visit: No results found.  Assessment/Plan  Hypertension  takes Diltiazem, Irbesartan, ASA 81mg  qd, Bun/creat 15/0.71 09/30/20   Anxiety Escalated in the past 2-3 weeks, declined anxiolytics, declined labs, denied headache, change of vision, chest pain, palpitation, cough, SOB, abd pain, dysuria, will have prn Lorazepam 0.5mg  bid available to her. B vs R of Lorazepam explained to the patient.    Hypothyroidism takes Levothyroxine 112 mcg  qd, TSH 5.31 f/u endocrinology.    Dyslipidemia diet, exercise, LDL 173 03/12/20   Osteoporosis OP 10/01/20 DEXA, t score -2.7,  takes Vit D, declined in the past.    Atrial fibrillation ([facility]) Heart rate is in control, takes Diltiazem, ASA. Didn't tolerate Coumadin.    Labs/tests ordered:  * No order type specified * Next appt:  02/12/2021

## 2020-12-13 NOTE — Assessment & Plan Note (Signed)
Heart rate is in control, takes Diltiazem, ASA. Didn't tolerate Coumadin.

## 2020-12-13 NOTE — Assessment & Plan Note (Signed)
takes Diltiazem, Irbesartan, ASA 81mg  qd, Bun/creat 15/0.71 09/30/20

## 2020-12-14 ENCOUNTER — Encounter: Payer: Self-pay | Admitting: Nurse Practitioner

## 2021-01-08 DIAGNOSIS — R002 Palpitations: Secondary | ICD-10-CM | POA: Diagnosis not present

## 2021-01-08 DIAGNOSIS — I10 Essential (primary) hypertension: Secondary | ICD-10-CM | POA: Diagnosis not present

## 2021-01-15 ENCOUNTER — Encounter: Payer: Self-pay | Admitting: Internal Medicine

## 2021-01-30 DIAGNOSIS — Z961 Presence of intraocular lens: Secondary | ICD-10-CM | POA: Diagnosis not present

## 2021-01-30 DIAGNOSIS — H52203 Unspecified astigmatism, bilateral: Secondary | ICD-10-CM | POA: Diagnosis not present

## 2021-01-30 DIAGNOSIS — H18593 Other hereditary corneal dystrophies, bilateral: Secondary | ICD-10-CM | POA: Diagnosis not present

## 2021-02-05 DIAGNOSIS — Z0100 Encounter for examination of eyes and vision without abnormal findings: Secondary | ICD-10-CM | POA: Diagnosis not present

## 2021-02-12 ENCOUNTER — Encounter: Payer: Medicare HMO | Admitting: Nurse Practitioner

## 2021-02-14 ENCOUNTER — Encounter: Payer: Medicare HMO | Admitting: Nurse Practitioner

## 2021-02-14 ENCOUNTER — Other Ambulatory Visit: Payer: Self-pay

## 2021-02-14 ENCOUNTER — Telehealth: Payer: Self-pay | Admitting: Nurse Practitioner

## 2021-02-14 NOTE — Assessment & Plan Note (Signed)
takes Levothyroxine 112 mcg qd, TSH 3.978 01/15/21,  f/u endocrinology.

## 2021-02-14 NOTE — Assessment & Plan Note (Signed)
One episode 12/2010, started Diltiazem, took Coumadin for a while due to intolerance, then ASA 81mg 

## 2021-02-14 NOTE — Assessment & Plan Note (Signed)
diet, exercise, LDL 173 03/12/20

## 2021-02-14 NOTE — Assessment & Plan Note (Signed)
Blood pressure is controlled, except white coat syndrome, takes Diltiazem, Irbesartan, ASA 81mg  qd, Bun/creat 15/0.71 09/30/20

## 2021-02-14 NOTE — Progress Notes (Signed)
This encounter was created in error - please disregard.

## 2021-02-14 NOTE — Assessment & Plan Note (Signed)
10/01/20 DEXA, t score -2.7,  takes Vit D, declined tx 2016

## 2021-02-14 NOTE — Assessment & Plan Note (Signed)
Managed with prn Lorazepam.

## 2021-02-14 NOTE — Telephone Encounter (Signed)
Stacy [name] called around 3:12 and asked why she had not been called for her AWV that was scheduled at 3pm. I told her they may be running behind a little bit in the clinic & she stated that if that was the case then somebody should call her and ask her if that is okay. So then I asked her if she wanted to reschedule  her appt and she said no she does not she had enough of waiting. So she does not want to do another AWV

## 2021-02-21 ENCOUNTER — Encounter (INDEPENDENT_AMBULATORY_CARE_PROVIDER_SITE_OTHER): Payer: Self-pay | Admitting: Ophthalmology

## 2021-03-05 DIAGNOSIS — Z6821 Body mass index (BMI) 21.0-21.9, adult: Secondary | ICD-10-CM | POA: Diagnosis not present

## 2021-03-05 DIAGNOSIS — Z0000 Encounter for general adult medical examination without abnormal findings: Secondary | ICD-10-CM | POA: Diagnosis not present

## 2021-03-05 DIAGNOSIS — E782 Mixed hyperlipidemia: Secondary | ICD-10-CM | POA: Diagnosis not present

## 2021-03-05 DIAGNOSIS — E039 Hypothyroidism, unspecified: Secondary | ICD-10-CM | POA: Diagnosis not present

## 2021-03-05 DIAGNOSIS — I4891 Unspecified atrial fibrillation: Secondary | ICD-10-CM | POA: Diagnosis not present

## 2021-03-05 DIAGNOSIS — Z86010 Personal history of colonic polyps: Secondary | ICD-10-CM | POA: Diagnosis not present

## 2021-03-05 DIAGNOSIS — I10 Essential (primary) hypertension: Secondary | ICD-10-CM | POA: Diagnosis not present

## 2021-03-08 ENCOUNTER — Telehealth: Payer: Self-pay | Admitting: Internal Medicine

## 2021-03-08 DIAGNOSIS — E039 Hypothyroidism, unspecified: Secondary | ICD-10-CM

## 2021-03-08 MED ORDER — LEVOTHYROXINE SODIUM 112 MCG PO TABS: 112.0000 ug | ORAL_TABLET | Freq: Every day | ORAL | 0 refills | Status: DC

## 2021-03-08 NOTE — Telephone Encounter (Signed)
Rx sent for 90 days

## 2021-03-08 NOTE — Telephone Encounter (Signed)
MEDICATION: levothyroxine (SYNTHROID) 112 MCG tablet   (Patient requests 90 day supply with refills)   PHARMACY:   [facility], [place] [address] Phone:  [phone]  Fax:  [phone]      HAS THE PATIENT CONTACTED [facility]?  Yes  IS THIS A 90 DAY SUPPLY : Yes  IS PATIENT OUT OF MEDICATION: No  IF NOT; HOW MUCH IS LEFT: Approx. 1 week  LAST APPOINTMENT DATE: '@8'$ /17/21  NEXT APPOINTMENT DATE:Patient states labs were drawn at Cardiologist who faxed results to Dr. Cruzita Lederer and that Dr. Cruzita Lederer does not need Patient to schedule f/u at this time  DO [place] MESSAGE?:Yes  OTHER COMMENTS:    **Let patient know to contact pharmacy at the end of the day to make sure medication is ready. **  ** Please notify patient to allow 48-72 hours to process**  **Encourage patient to contact the pharmacy for refills or they can request refills through [facility]**

## 2021-03-19 DIAGNOSIS — U071 COVID-19: Secondary | ICD-10-CM | POA: Diagnosis not present

## 2021-03-21 ENCOUNTER — Encounter (INDEPENDENT_AMBULATORY_CARE_PROVIDER_SITE_OTHER): Payer: Medicare HMO | Admitting: Ophthalmology

## 2021-03-26 DIAGNOSIS — Z0000 Encounter for general adult medical examination without abnormal findings: Secondary | ICD-10-CM | POA: Diagnosis not present

## 2021-04-09 ENCOUNTER — Other Ambulatory Visit: Payer: Self-pay

## 2021-04-09 ENCOUNTER — Encounter (INDEPENDENT_AMBULATORY_CARE_PROVIDER_SITE_OTHER): Payer: Self-pay | Admitting: Ophthalmology

## 2021-04-09 ENCOUNTER — Ambulatory Visit (INDEPENDENT_AMBULATORY_CARE_PROVIDER_SITE_OTHER): Payer: Medicare HMO | Admitting: Ophthalmology

## 2021-04-09 DIAGNOSIS — H353111 Nonexudative age-related macular degeneration, right eye, early dry stage: Secondary | ICD-10-CM | POA: Diagnosis not present

## 2021-04-09 DIAGNOSIS — H3322 Serous retinal detachment, left eye: Secondary | ICD-10-CM

## 2021-04-09 NOTE — Assessment & Plan Note (Signed)
No signs of recurrence.  No therapy.

## 2021-04-09 NOTE — Assessment & Plan Note (Signed)
Stable no signs of CNVM

## 2021-04-09 NOTE — Assessment & Plan Note (Signed)
History of disease activity in the past resolved currently

## 2021-04-09 NOTE — Progress Notes (Signed)
04/09/2021     CHIEF COMPLAINT Patient presents for  Chief Complaint  Patient presents with   Retina Follow Up      HISTORY OF PRESENT ILLNESS: Stacy [name] is a 80 y.o. female who presents to the clinic today for:   HPI     Retina Follow Up   Patient presents with  Dry AMD.  In right eye.  This started 1 year ago.  Severity is mild.  Duration of 1 year.  Since onset it is stable.        Comments   1 year fu OU and OCT/FP Pt states VA OU stable since last visit. Pt denies FOL, floaters, or ocular pain OU.  Pt states, "I do get occasional flashes of light if I go from light room to a dark room but it is very rare that I see them."       Last edited by Kendra Opitz, COA on 04/09/2021 10:46 AM.      Referring physician: Luberta Mutter, MD [place],  [place] [address]  HISTORICAL INFORMATION:   Selected notes from the [facility]: No current outpatient medications on file. (Ophthalmic Drugs)   No current facility-administered medications for this visit. (Ophthalmic Drugs)   Current Outpatient Medications (Other)  Medication Sig   aspirin 81 MG tablet Take 81 mg by mouth daily.   Cholecalciferol (VITAMIN D-3) 1000 UNITS CAPS Take 1,000 Units by mouth 2 (two) times daily.    diltiazem (CARTIA XT) 120 MG 24 hr capsule Take 1 capsule (120 mg total) by mouth at bedtime.   irbesartan (AVAPRO) 150 MG tablet Take 150 mg by mouth daily.   irbesartan (AVAPRO) 300 MG tablet Take 0.5 tablets (150 mg total) by mouth daily.   levothyroxine (SYNTHROID) 112 MCG tablet Take 1 tablet (112 mcg total) by mouth daily before breakfast.   LORazepam (ATIVAN) 0.5 MG tablet Take 1 tablet (0.5 mg total) by mouth 2 (two) times daily as needed for anxiety.   Multiple Vitamin (MULTIVITAMIN) capsule Take 1 capsule by mouth daily.   Multiple Vitamins-Minerals (PRESERVISION AREDS PO) Take 1 tablet by mouth daily.   No current  facility-administered medications for this visit. (Other)      REVIEW OF SYSTEMS:    ALLERGIES Allergies  Allergen Reactions   Amoxicillin Swelling   Flonase [Fluticasone Propionate] Swelling    Swelling of throat per patient    Sulfa Antibiotics Other (See Comments)    Upset stomach   Sulfasalazine Other (See Comments)    Upset stomach    PAST MEDICAL HISTORY Past Medical History:  Diagnosis Date   Atrial fibrillation ([place])    echo 12/23/10 EF= >55%, stress myoview 01/30/11 normal pattern of perfusion in all myocardial regions   Carotid artery stenosis    Per [facility] new patient packet    Chicken pox    Colon polyp    Colon polyp    Dyslipidemia    Heart murmur    Hiatal hernia    Hypothyroidism    Laryngopharyngeal reflux    Osteopenia    Scoliosis    Seasonal allergies    Thyroid disease    Vitamin D deficiency    Past Surgical History:  Procedure Laterality Date   BREAST CYST EXCISION Right 1988   ESOPHAGEAL MANOMETRY N/A 08/29/2015   Procedure: ESOPHAGEAL MANOMETRY (EM);  Surgeon: Manus Gunning, MD;  Location: [facility];  Service: Gastroenterology;  Laterality: N/A;   EYE SURGERY     Cataract eye surgery-right 06/2014, left 04/2014   TONSILLECTOMY     Childhood    FAMILY HISTORY Family History  Problem Relation Age of Onset   CVA Mother    Hyperlipidemia Mother    Hypertension Mother    Anuerysm Father        brain   Stroke Maternal Grandmother    Hypertension Maternal Grandmother    Hypertension Sister    Scoliosis Sister    Colon cancer Neg Hx    Heart attack Neg Hx    Breast cancer Neg Hx     SOCIAL HISTORY Social History   Tobacco Use   Smoking status: Never   Smokeless tobacco: Never  Substance Use Topics   Alcohol use: Yes    Alcohol/week: 1.0 standard drink    Types: 1 Glasses of wine per week    Comment: 1 glass of wine 3x a week   Drug use: No         OPHTHALMIC EXAM:  Base Eye Exam     Visual Acuity (ETDRS)        Right Left   Dist cc 20/25 -2 20/30 +2   Dist ph cc  NI    Correction: Glasses         Tonometry (Tonopen, 10:50 AM)       Right Left   Pressure 16 18         Pupils       Pupils Dark Light Shape React APD   Right PERRL 3 3 Round Minimal None   Left PERRL 3 3 Round Minimal None         Visual Fields (Counting fingers)       Left Right    Full Full         Extraocular Movement       Right Left    Full Full         Neuro/Psych     Oriented x3: Yes   Mood/Affect: Normal         Dilation     Both eyes: 1.0% Mydriacyl, 2.5% Phenylephrine @ 10:50 AM           Slit Lamp and Fundus Exam     External Exam       Right Left   External Normal Normal         Slit Lamp Exam       Right Left   Lids/Lashes Normal Normal   Conjunctiva/Sclera White and quiet White and quiet   Cornea Clear Clear   Anterior Chamber Deep and quiet Deep and quiet   Iris Round and reactive Round and reactive   Lens Posterior chamber intraocular lens Posterior chamber intraocular lens   Anterior Vitreous Normal Normal         Fundus Exam       Right Left   Posterior Vitreous Posterior vitreous detachment Posterior vitreous detachment   Disc Normal Normal   C/D Ratio 0.25 0.25   Macula Hard drusen, no macular thickening, no exudates Hard drusen, no macular thickening, no exudates   Vessels Normal Normal   Periphery Normal Normal            IMAGING AND PROCEDURES  Imaging and Procedures for 04/09/21  OCT, Retina - OU - Both Eyes       Right Eye Quality was good. Scan locations included subfoveal. Central Foveal Thickness: 281. Progression has been stable. Findings  include no SRF, no IRF, retinal drusen .   Left Eye Quality was good. Scan locations included subfoveal. Central Foveal Thickness: 280. Progression has been stable. Findings include retinal drusen .      Color Fundus Photography Optos - OU - Both Eyes       Right  Eye Progression has been stable. Disc findings include normal observations. Macula : retinal pigment epithelium abnormalities. Vessels : normal observations. Periphery : normal observations.   Left Eye Progression has been stable. Disc findings include normal observations. Macula : retinal pigment epithelium abnormalities. Vessels : normal observations. Periphery : normal observations.   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  No therapy.     ICD-10-CM   1. 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 Serous retinal detachment of left eye  H33.22       1.  Intermediate ARMD OU stable  2.  History of serous retinal detachment left eye treated elsewhere, another practice, with antivegF.  Treated here with simple observation and complete resolution.  No signs of recurrence.  3.  Ophthalmic Meds Ordered this visit:  No orders of the defined types were placed in this encounter.      Return in about 1 year (around 04/09/2022) for DILATE OU, COLOR FP, OCT.  There are no Patient Instructions on file for this visit.   Explained the diagnoses, plan, and follow up with the patient and they expressed understanding.  Patient expressed understanding of the importance of proper follow up care.   Clent Demark Zana Biancardi M.D. Diseases & Surgery of the Retina and Vitreous [facility]ina [facility] Diabetic [place] 04/09/21     Abbreviations: M myopia (nearsighted); A astigmatism; H hyperopia (farsighted); P presbyopia; Mrx spectacle prescription;  CTL contact lenses; OD right eye; OS left eye;  OU both eyes  XT exotropia; ET esotropia; PEK punctate epithelial keratitis; PEE punctate epithelial erosions; DES dry eye syndrome; MGD meibomian gland dysfunction; ATs artificial tears; PFAT's preservative free artificial tears; Major nuclear sclerotic cataract; PSC posterior subcapsular cataract; ERM epi-retinal membrane; PVD posterior vitreous detachment; RD retinal detachment; DM diabetes mellitus; DR diabetic retinopathy; NPDR non-proliferative diabetic retinopathy; PDR proliferative diabetic retinopathy; CSME clinically significant macular edema; DME diabetic macular edema; dbh dot blot hemorrhages; CWS cotton wool spot; POAG primary open angle glaucoma; C/D cup-to-disc ratio; HVF humphrey visual field; GVF goldmann visual field; OCT optical coherence tomography; IOP intraocular pressure; BRVO Branch retinal vein occlusion; CRVO central retinal vein occlusion; CRAO central retinal artery occlusion; BRAO branch retinal artery occlusion; RT retinal tear; SB scleral buckle; PPV pars plana vitrectomy; VH Vitreous hemorrhage; PRP panretinal laser photocoagulation; IVK intravitreal kenalog; VMT vitreomacular traction; MH Macular hole;  NVD neovascularization of the disc; NVE neovascularization elsewhere; AREDS age related eye disease study; ARMD age related macular degeneration; POAG primary open angle glaucoma; EBMD epithelial/anterior basement membrane dystrophy; ACIOL anterior chamber intraocular lens; IOL intraocular lens; PCIOL posterior chamber intraocular lens; Phaco/IOL phacoemulsification with intraocular lens placement; PRK photorefractive keratectomy; LASIK laser assisted in situ keratomileusis; HTN hypertension; DM diabetes  mellitus; COPD chronic obstructive pulmonary disease

## 2021-04-11 DIAGNOSIS — R002 Palpitations: Secondary | ICD-10-CM | POA: Diagnosis not present

## 2021-04-11 DIAGNOSIS — I10 Essential (primary) hypertension: Secondary | ICD-10-CM | POA: Diagnosis not present

## 2021-04-30 DIAGNOSIS — Z8639 Personal history of other endocrine, nutritional and metabolic disease: Secondary | ICD-10-CM | POA: Diagnosis not present

## 2021-04-30 DIAGNOSIS — Z6821 Body mass index (BMI) 21.0-21.9, adult: Secondary | ICD-10-CM | POA: Diagnosis not present

## 2021-04-30 DIAGNOSIS — E782 Mixed hyperlipidemia: Secondary | ICD-10-CM | POA: Diagnosis not present

## 2021-04-30 LAB — LAB REPORT - SCANNED: EGFR: 88

## 2021-05-21 DIAGNOSIS — E039 Hypothyroidism, unspecified: Secondary | ICD-10-CM | POA: Diagnosis not present

## 2021-05-21 DIAGNOSIS — R69 Illness, unspecified: Secondary | ICD-10-CM | POA: Diagnosis not present

## 2021-09-17 DIAGNOSIS — L814 Other melanin hyperpigmentation: Secondary | ICD-10-CM | POA: Diagnosis not present

## 2021-09-17 DIAGNOSIS — L821 Other seborrheic keratosis: Secondary | ICD-10-CM | POA: Diagnosis not present

## 2021-09-17 DIAGNOSIS — D1801 Hemangioma of skin and subcutaneous tissue: Secondary | ICD-10-CM | POA: Diagnosis not present

## 2021-09-17 DIAGNOSIS — L570 Actinic keratosis: Secondary | ICD-10-CM | POA: Diagnosis not present

## 2021-10-15 DIAGNOSIS — L814 Other melanin hyperpigmentation: Secondary | ICD-10-CM | POA: Diagnosis not present

## 2021-10-15 DIAGNOSIS — L821 Other seborrheic keratosis: Secondary | ICD-10-CM | POA: Diagnosis not present

## 2021-10-15 DIAGNOSIS — L570 Actinic keratosis: Secondary | ICD-10-CM | POA: Diagnosis not present

## 2021-10-15 DIAGNOSIS — L988 Other specified disorders of the skin and subcutaneous tissue: Secondary | ICD-10-CM | POA: Diagnosis not present

## 2021-11-19 DIAGNOSIS — E039 Hypothyroidism, unspecified: Secondary | ICD-10-CM | POA: Diagnosis not present

## 2021-11-19 DIAGNOSIS — Z79899 Other long term (current) drug therapy: Secondary | ICD-10-CM | POA: Diagnosis not present

## 2021-11-19 DIAGNOSIS — R69 Illness, unspecified: Secondary | ICD-10-CM | POA: Diagnosis not present

## 2021-11-19 DIAGNOSIS — I4891 Unspecified atrial fibrillation: Secondary | ICD-10-CM | POA: Diagnosis not present

## 2021-11-19 DIAGNOSIS — E782 Mixed hyperlipidemia: Secondary | ICD-10-CM | POA: Diagnosis not present

## 2021-11-19 DIAGNOSIS — I10 Essential (primary) hypertension: Secondary | ICD-10-CM | POA: Diagnosis not present

## 2021-11-19 DIAGNOSIS — F411 Generalized anxiety disorder: Secondary | ICD-10-CM | POA: Diagnosis not present

## 2021-11-19 DIAGNOSIS — R300 Dysuria: Secondary | ICD-10-CM | POA: Diagnosis not present

## 2021-11-19 LAB — LAB REPORT - SCANNED: EGFR: 86

## 2021-12-28 DIAGNOSIS — I6529 Occlusion and stenosis of unspecified carotid artery: Secondary | ICD-10-CM | POA: Diagnosis not present

## 2021-12-28 DIAGNOSIS — I10 Essential (primary) hypertension: Secondary | ICD-10-CM | POA: Diagnosis not present

## 2021-12-28 DIAGNOSIS — E782 Mixed hyperlipidemia: Secondary | ICD-10-CM | POA: Diagnosis not present

## 2022-01-06 DIAGNOSIS — Z79899 Other long term (current) drug therapy: Secondary | ICD-10-CM | POA: Diagnosis not present

## 2022-01-06 DIAGNOSIS — I4811 Longstanding persistent atrial fibrillation: Secondary | ICD-10-CM | POA: Diagnosis not present

## 2022-01-06 DIAGNOSIS — I10 Essential (primary) hypertension: Secondary | ICD-10-CM | POA: Diagnosis not present

## 2022-02-05 DIAGNOSIS — Z961 Presence of intraocular lens: Secondary | ICD-10-CM | POA: Diagnosis not present

## 2022-02-05 DIAGNOSIS — H52203 Unspecified astigmatism, bilateral: Secondary | ICD-10-CM | POA: Diagnosis not present

## 2022-02-25 ENCOUNTER — Telehealth: Payer: Self-pay

## 2022-02-25 NOTE — Telephone Encounter (Signed)
NOTES SCANNED TO REFERRAL 

## 2022-03-13 ENCOUNTER — Telehealth: Payer: Self-pay | Admitting: Interventional Cardiology

## 2022-03-13 NOTE — Telephone Encounter (Signed)
Patient would like to switch from Dr. Irish Lack to Dr. Debara Pickett.  Are you both in agreement with this? Please advise.

## 2022-04-14 ENCOUNTER — Encounter (INDEPENDENT_AMBULATORY_CARE_PROVIDER_SITE_OTHER): Payer: Self-pay | Admitting: Ophthalmology

## 2022-04-14 ENCOUNTER — Ambulatory Visit (INDEPENDENT_AMBULATORY_CARE_PROVIDER_SITE_OTHER): Payer: Medicare HMO | Admitting: Ophthalmology

## 2022-04-14 DIAGNOSIS — H3322 Serous retinal detachment, left eye: Secondary | ICD-10-CM

## 2022-04-14 DIAGNOSIS — H40003 Preglaucoma, unspecified, bilateral: Secondary | ICD-10-CM

## 2022-04-14 DIAGNOSIS — H353121 Nonexudative age-related macular degeneration, left eye, early dry stage: Secondary | ICD-10-CM

## 2022-04-14 DIAGNOSIS — H353111 Nonexudative age-related macular degeneration, right eye, early dry stage: Secondary | ICD-10-CM

## 2022-04-14 NOTE — Assessment & Plan Note (Signed)
Monitor with Dr. Luberta Mutter stable at present

## 2022-04-14 NOTE — Assessment & Plan Note (Signed)
No signs of recurrence.  Stable.

## 2022-04-14 NOTE — Progress Notes (Signed)
04/14/2022     CHIEF COMPLAINT Patient presents for  Chief Complaint  Patient presents with   Macular Degeneration      HISTORY OF PRESENT ILLNESS: Stacy [name] is a 81 y.o. female who presents to the clinic today for:   HPI   1 YR FU OU OCT FP. Pt stated vision has remained stable since last visit. Pt denies new floaters and FOL. Pt has allergy to sulfa antibiotics.  Last edited by Silvestre Moment on 04/14/2022 10:58 AM.      Referring physician: Mast, Man X, NP [id number] [place],  [place] [address]  HISTORICAL INFORMATION:   Selected notes from the MEDICAL RECORD NUMBER       CURRENT MEDICATIONS: No current outpatient medications on file. (Ophthalmic Drugs)   No current facility-administered medications for this visit. (Ophthalmic Drugs)   Current Outpatient Medications (Other)  Medication Sig   aspirin 81 MG tablet Take 81 mg by mouth daily.   Cholecalciferol (VITAMIN D-3) 1000 UNITS CAPS Take 1,000 Units by mouth 2 (two) times daily.    diltiazem (CARTIA XT) 120 MG 24 hr capsule Take 1 capsule (120 mg total) by mouth at bedtime.   irbesartan (AVAPRO) 150 MG tablet Take 150 mg by mouth daily.   irbesartan (AVAPRO) 300 MG tablet Take 0.5 tablets (150 mg total) by mouth daily.   levothyroxine (SYNTHROID) 112 MCG tablet Take 1 tablet (112 mcg total) by mouth daily before breakfast.   LORazepam (ATIVAN) 0.5 MG tablet Take 1 tablet (0.5 mg total) by mouth 2 (two) times daily as needed for anxiety.   Multiple Vitamin (MULTIVITAMIN) capsule Take 1 capsule by mouth daily.   Multiple Vitamins-Minerals (PRESERVISION AREDS PO) Take 1 tablet by mouth daily.   No current facility-administered medications for this visit. (Other)      REVIEW OF SYSTEMS: ROS   Negative for: Constitutional, Gastrointestinal, Neurological, Skin, Genitourinary, Musculoskeletal, HENT, Endocrine, Cardiovascular, Eyes, Respiratory, Psychiatric, Allergic/Imm, Heme/Lymph Last edited by Silvestre Moment  on 04/14/2022 10:58 AM.       ALLERGIES Allergies  Allergen Reactions   Amoxicillin Swelling   Flonase [Fluticasone Propionate] Swelling    Swelling of throat per patient    Sulfa Antibiotics Other (See Comments)    Upset stomach   Sulfasalazine Other (See Comments)    Upset stomach    PAST MEDICAL HISTORY Past Medical History:  Diagnosis Date   Atrial fibrillation ([place])    echo 12/23/10 EF= >55%, stress myoview 01/30/11 normal pattern of perfusion in all myocardial regions   Carotid artery stenosis    Per [facility] new patient packet    Chicken pox    Colon polyp    Colon polyp    Dyslipidemia    Heart murmur    Hiatal hernia    Hypothyroidism    Laryngopharyngeal reflux    Osteopenia    Scoliosis    Seasonal allergies    Thyroid disease    Vitamin D deficiency    Past Surgical History:  Procedure Laterality Date   BREAST CYST EXCISION Right 1988   ESOPHAGEAL MANOMETRY N/A 08/29/2015   Procedure: ESOPHAGEAL MANOMETRY (EM);  Surgeon: Manus Gunning, MD;  Location: [facility];  Service: Gastroenterology;  Laterality: N/A;   EYE SURGERY     Cataract eye surgery-right 06/2014, left 04/2014   TONSILLECTOMY     Childhood    FAMILY HISTORY Family History  Problem Relation Age of Onset   CVA Mother    Hyperlipidemia Mother  Hypertension Mother    Anuerysm Father        brain   Stroke Maternal Grandmother    Hypertension Maternal Grandmother    Hypertension Sister    Scoliosis Sister    Colon cancer Neg Hx    Heart attack Neg Hx    Breast cancer Neg Hx     SOCIAL HISTORY Social History   Tobacco Use   Smoking status: Never   Smokeless tobacco: Never  Substance Use Topics   Alcohol use: Yes    Alcohol/week: 1.0 standard drink of alcohol    Types: 1 Glasses of wine per week    Comment: 1 glass of wine 3x a week   Drug use: No         OPHTHALMIC EXAM:  Base Eye Exam     Visual Acuity (ETDRS)       Right Left   Dist cc 20/25 -2 +2 20/25  -2    Correction: Glasses         Tonometry (Tonopen, 11:03 AM)       Right Left   Pressure 16 17         Pupils       Pupils Dark Light Shape React APD   Right PERRL 3 3 Round Minimal None   Left PERRL 3 3 Round Minimal None         Visual Fields       Left Right    Full Full         Extraocular Movement       Right Left    Full, Ortho Full, Ortho         Neuro/Psych     Oriented x3: Yes   Mood/Affect: Normal         Dilation     Both eyes: 1.0% Mydriacyl, 2.5% Phenylephrine @ 11:03 AM           Slit Lamp and Fundus Exam     External Exam       Right Left   External Normal Normal         Slit Lamp Exam       Right Left   Lids/Lashes Normal Normal   Conjunctiva/Sclera White and quiet White and quiet   Cornea Clear Clear   Anterior Chamber Deep and quiet Deep and quiet   Iris Round and reactive Round and reactive   Lens Posterior chamber intraocular lens Posterior chamber intraocular lens   Anterior Vitreous Normal Normal         Fundus Exam       Right Left   Posterior Vitreous Posterior vitreous detachment Posterior vitreous detachment   Disc Normal Normal   C/D Ratio 0.25 0.25   Macula Hard drusen, no macular thickening, no exudates Hard drusen, no macular thickening, no exudates   Vessels Normal Normal   Periphery Normal Normal            IMAGING AND PROCEDURES  Imaging and Procedures for 04/14/22  OCT, Retina - OU - Both Eyes       Right Eye Quality was good. Scan locations included subfoveal. Central Foveal Thickness: 285. Progression has been stable. Findings include no IRF, no SRF, retinal drusen .   Left Eye Quality was good. Scan locations included subfoveal. Central Foveal Thickness: 279. Progression has been stable. Findings include retinal drusen .   Notes History of CS CR and multiple recurrences OS.  No active disease no active CNVM  Color Fundus Photography Optos - OU - Both Eyes        Right Eye Progression has been stable. Disc findings include normal observations. Macula : retinal pigment epithelium abnormalities. Vessels : normal observations. Periphery : normal observations.   Left Eye Progression has been stable. Disc findings include normal observations. Macula : retinal pigment epithelium abnormalities. Vessels : normal observations. Periphery : normal observations.   Notes Mid peripheral CHRPE nasal OD             ASSESSMENT/PLAN:  Serous retinal detachment of left eye No signs of recurrence.  Stable.  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 Luberta Mutter stable at present     ICD-10-CM   1. 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 Serous retinal detachment of left eye  H33.22     3. 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 Glaucoma suspect of both eyes  H40.003       1.  OU doing very well with only mild ARMD changes.  No sign of CNVM 2.  History of CS CR, at an advanced age, in the past, treated with observation in the past and spontaneous resolution without antivegF.    3.  Continue to monitor and observe  Ophthalmic Meds Ordered this visit:  No orders of the defined types were placed in this encounter.      Return in about 1 year (around 04/15/2023) for DILATE OU, COLOR FP.  There are no Patient Instructions on file for this visit.   Explained the diagnoses, plan, and follow up with the patient and they expressed understanding.  Patient expressed understanding of the importance of proper follow up care.   Clent Demark Symphony Demuro M.D. Diseases & Surgery of the Retina and Vitreous [facility]ina  [facility] [place] 04/14/22     Abbreviations: M myopia (nearsighted); A astigmatism; H hyperopia (farsighted); P presbyopia; Mrx spectacle prescription;  CTL contact lenses; OD right eye; OS left eye; OU both eyes  XT exotropia; ET esotropia; PEK punctate epithelial keratitis; PEE punctate epithelial erosions; DES dry eye syndrome; MGD meibomian gland dysfunction; ATs artificial tears; PFAT's preservative free artificial tears; [place] nuclear sclerotic cataract; PSC posterior subcapsular cataract; ERM epi-retinal membrane; PVD posterior vitreous detachment; RD retinal detachment; DM diabetes mellitus; DR diabetic retinopathy; NPDR non-proliferative diabetic retinopathy; PDR proliferative diabetic retinopathy; CSME clinically significant macular edema; DME diabetic macular edema; dbh dot blot hemorrhages; CWS cotton wool spot; POAG primary open angle glaucoma; C/D cup-to-disc ratio; HVF humphrey visual field; GVF goldmann visual field; OCT optical coherence tomography; IOP intraocular pressure; BRVO Branch retinal vein occlusion; CRVO central retinal vein occlusion; CRAO central retinal artery occlusion; BRAO branch retinal artery occlusion; RT retinal tear; SB scleral buckle; PPV pars plana vitrectomy; VH Vitreous hemorrhage; PRP panretinal laser photocoagulation; IVK intravitreal kenalog; VMT vitreomacular traction; MH Macular hole;  NVD neovascularization of the disc; NVE neovascularization elsewhere; AREDS age related eye disease study; ARMD age related macular degeneration; POAG primary open angle glaucoma; EBMD epithelial/anterior basement membrane dystrophy; ACIOL anterior chamber intraocular lens; IOL intraocular lens; PCIOL posterior chamber intraocular lens; Phaco/IOL phacoemulsification with intraocular lens placement; PRK photorefractive  keratectomy; LASIK laser assisted in situ keratomileusis; HTN hypertension; DM diabetes mellitus; COPD chronic obstructive pulmonary disease

## 2022-04-14 NOTE — Assessment & Plan Note (Signed)
Early OD and OS

## 2022-04-14 NOTE — Assessment & Plan Note (Signed)
Stable OS 

## 2022-05-22 DIAGNOSIS — E782 Mixed hyperlipidemia: Secondary | ICD-10-CM | POA: Diagnosis not present

## 2022-05-22 DIAGNOSIS — E871 Hypo-osmolality and hyponatremia: Secondary | ICD-10-CM | POA: Diagnosis not present

## 2022-05-22 DIAGNOSIS — E039 Hypothyroidism, unspecified: Secondary | ICD-10-CM | POA: Diagnosis not present

## 2022-05-22 DIAGNOSIS — I10 Essential (primary) hypertension: Secondary | ICD-10-CM | POA: Diagnosis not present

## 2022-05-22 LAB — LAB REPORT - SCANNED: EGFR: 87

## 2022-05-26 DIAGNOSIS — I4891 Unspecified atrial fibrillation: Secondary | ICD-10-CM | POA: Diagnosis not present

## 2022-05-26 DIAGNOSIS — Z6820 Body mass index (BMI) 20.0-20.9, adult: Secondary | ICD-10-CM | POA: Diagnosis not present

## 2022-05-26 DIAGNOSIS — E039 Hypothyroidism, unspecified: Secondary | ICD-10-CM | POA: Diagnosis not present

## 2022-05-26 DIAGNOSIS — R69 Illness, unspecified: Secondary | ICD-10-CM | POA: Diagnosis not present

## 2022-05-26 DIAGNOSIS — Z0000 Encounter for general adult medical examination without abnormal findings: Secondary | ICD-10-CM | POA: Diagnosis not present

## 2022-05-26 DIAGNOSIS — E782 Mixed hyperlipidemia: Secondary | ICD-10-CM | POA: Diagnosis not present

## 2022-05-26 DIAGNOSIS — M419 Scoliosis, unspecified: Secondary | ICD-10-CM | POA: Diagnosis not present

## 2022-05-26 DIAGNOSIS — I10 Essential (primary) hypertension: Secondary | ICD-10-CM | POA: Diagnosis not present

## 2022-05-28 DIAGNOSIS — M419 Scoliosis, unspecified: Secondary | ICD-10-CM | POA: Diagnosis not present

## 2022-07-07 ENCOUNTER — Emergency Department (HOSPITAL_BASED_OUTPATIENT_CLINIC_OR_DEPARTMENT_OTHER): Payer: Medicare HMO | Admitting: Radiology

## 2022-07-07 ENCOUNTER — Emergency Department (HOSPITAL_BASED_OUTPATIENT_CLINIC_OR_DEPARTMENT_OTHER)
Admission: EM | Admit: 2022-07-07 | Discharge: 2022-07-07 | Disposition: A | Discharge status: Home / Self Care | Payer: Medicare HMO | Attending: Emergency Medicine | Admitting: Emergency Medicine

## 2022-07-07 ENCOUNTER — Encounter (HOSPITAL_BASED_OUTPATIENT_CLINIC_OR_DEPARTMENT_OTHER): Payer: Self-pay

## 2022-07-07 ENCOUNTER — Other Ambulatory Visit: Payer: Self-pay

## 2022-07-07 DIAGNOSIS — Z7982 Long term (current) use of aspirin: Secondary | ICD-10-CM | POA: Insufficient documentation

## 2022-07-07 DIAGNOSIS — E039 Hypothyroidism, unspecified: Secondary | ICD-10-CM | POA: Diagnosis not present

## 2022-07-07 DIAGNOSIS — W101XXA Fall (on)(from) sidewalk curb, initial encounter: Secondary | ICD-10-CM | POA: Insufficient documentation

## 2022-07-07 DIAGNOSIS — Z79899 Other long term (current) drug therapy: Secondary | ICD-10-CM | POA: Insufficient documentation

## 2022-07-07 DIAGNOSIS — Y92480 Sidewalk as the place of occurrence of the external cause: Secondary | ICD-10-CM | POA: Insufficient documentation

## 2022-07-07 DIAGNOSIS — S82001A Unspecified fracture of right patella, initial encounter for closed fracture: Secondary | ICD-10-CM | POA: Diagnosis not present

## 2022-07-07 DIAGNOSIS — M25561 Pain in right knee: Secondary | ICD-10-CM | POA: Diagnosis not present

## 2022-07-07 DIAGNOSIS — S82041A Displaced comminuted fracture of right patella, initial encounter for closed fracture: Secondary | ICD-10-CM | POA: Diagnosis not present

## 2022-07-07 DIAGNOSIS — S82044A Nondisplaced comminuted fracture of right patella, initial encounter for closed fracture: Secondary | ICD-10-CM | POA: Insufficient documentation

## 2022-07-07 NOTE — ED Notes (Signed)
Pt states she tripped over a concrete block and landed on her rt. Knee. +swelling and bruising noted Ice pack applied. Pt is not in pain unless she moves knee.

## 2022-07-07 NOTE — H&P (View-Only) (Signed)
PREOPERATIVE H&P  Chief Complaint: Right knee pain  HPI: Stacy [name] is a 81 y.o. female who presents for preoperative history and physical with a diagnosis of right knee pain. Patient presented to ED today for evaluation of fall.  She was on her way to the [facility] for her morning workout, she stepped down off of a curb and tripped over a concrete barrier in a parking spot landing on her right knee. She had immediate pain at this time and associated swelling to her right knee. X-rays were taken showing a comminuted displaced right patella fracture. This is significantly impairing activities of daily living.  She has elected for surgical management.   Past Medical History:  Diagnosis Date   Atrial fibrillation ([place])    echo 12/23/10 EF= >55%, stress myoview 01/30/11 normal pattern of perfusion in all myocardial regions   Carotid artery stenosis    Per [facility] new patient packet    Chicken pox    Colon polyp    Colon polyp    Dyslipidemia    Heart murmur    Hiatal hernia    Hypothyroidism    Laryngopharyngeal reflux    Osteopenia    Scoliosis    Seasonal allergies    Thyroid disease    Vitamin D deficiency    Past Surgical History:  Procedure Laterality Date   BREAST CYST EXCISION Right 1988   ESOPHAGEAL MANOMETRY N/A 08/29/2015   Procedure: ESOPHAGEAL MANOMETRY (EM);  Surgeon: Manus Gunning, MD;  Location: [facility];  Service: Gastroenterology;  Laterality: N/A;   EYE SURGERY     Cataract eye surgery-right 06/2014, left 04/2014   TONSILLECTOMY     Childhood   Social History   Socioeconomic History   Marital status: Single    Spouse name: Not on file   Number of children: Not on file   Years of education: Not on file   Highest education level: Not on file  Occupational History   Occupation: retired  Tobacco Use   Smoking status: Never   Smokeless tobacco: Never  Substance and Sexual Activity   Alcohol use: Not Currently    Alcohol/week: 1.0 standard drink of  alcohol    Types: 1 Glasses of wine per week    Comment: 1 glass of wine 3x a week   Drug use: No   Sexual activity: Not on file  Other Topics Concern   Not on file  Social History Narrative   Work or School: [employer] Situation: lives alone      Spiritual Beliefs: Jewish      Lifestyle: [facility] [employer] [facility]; diet healthy      Per [facility] New Patient Packet:      Diet: Heart healthy, low-fat      Caffeine: 1 cup of tea daily       Married, if yes what year: Single      Do you live in a house, apartment, assisted living, condo, trailer, ect: Apartment, 1 person, 7 stories       Pets: No      Current/Past profession: PHD, Education officer, museum      Exercise: Yes, Treadmill, weights, and walking          Living Will: Yes   DNR: Yes   POA/HPOA: Yes      Functional Status:   Do you have difficulty bathing or dressing yourself? No   Do you have difficulty preparing food or eating?  No   Do you have difficulty managing your medications? No   Do you have difficulty managing your finances? No   Do you have difficulty affording your medications? No         Social Determinants of Radio broadcast assistant Strain: Not on file  Food Insecurity: Not on file  Transportation Needs: Not on file  Physical Activity: Not on file  Stress: Not on file  Social Connections: Not on file   Family History  Problem Relation Age of Onset   CVA Mother    Hyperlipidemia Mother    Hypertension Mother    Anuerysm Father        brain   Stroke Maternal Grandmother    Hypertension Maternal Grandmother    Hypertension Sister    Scoliosis Sister    Colon cancer Neg Hx    Heart attack Neg Hx    Breast cancer Neg Hx    Allergies  Allergen Reactions   Amoxicillin Swelling   Flonase [Fluticasone Propionate] Swelling    Swelling of throat per patient    Sulfa Antibiotics Other (See Comments)    Upset stomach   Sulfasalazine Other (See Comments)    Upset stomach    Prior to Admission medications   Medication Sig Start Date End Date Taking? Authorizing Provider  aspirin 81 MG tablet Take 81 mg by mouth daily.    [provider]  Cholecalciferol (VITAMIN D-3) 1000 UNITS CAPS Take 1,000 Units by mouth 2 (two) times daily.     [provider]  diltiazem (CARTIA XT) 120 MG 24 hr capsule Take 1 capsule (120 mg total) by mouth at bedtime. 03/19/20   Jettie Booze, MD  irbesartan (AVAPRO) 150 MG tablet Take 150 mg by mouth daily.    [provider]  irbesartan (AVAPRO) 300 MG tablet Take 0.5 tablets (150 mg total) by mouth daily. 12/20/19   Jettie Booze, MD  levothyroxine (SYNTHROID) 112 MCG tablet Take 1 tablet (112 mcg total) by mouth daily before breakfast. 03/08/21   Philemon Kingdom, MD  LORazepam (ATIVAN) 0.5 MG tablet Take 1 tablet (0.5 mg total) by mouth 2 (two) times daily as needed for anxiety. 12/13/20   Mast, Man X, NP  Multiple Vitamin (MULTIVITAMIN) capsule Take 1 capsule by mouth daily.    [provider]  Multiple Vitamins-Minerals (PRESERVISION AREDS PO) Take 1 tablet by mouth daily.    [provider]     Positive ROS: All other systems have been reviewed and were otherwise negative with the exception of those mentioned in the HPI and as above.  Physical Exam: General: Alert, no acute distress Cardiovascular: No pedal edema Respiratory: No cyanosis, no use of accessory musculature GI: No organomegaly, abdomen is soft and non-tender Skin: No lesions in the area of chief complaint Neurologic: Sensation intact distally Psychiatric: Patient is competent for consent with normal mood and affect Lymphatic: No axillary or cervical lymphadenopathy  MUSCULOSKELETAL: patient in knee immobilizer. Significant edema to right knee and ecchymosis. Pain with any movement at knee. EHL and FHL Intact. Distal sensation intact.   X-rays shows a right knee comminuted and diplaced patella  fracture  Assessment: Comminuted and displaced right patella fracture   Plan: Plan for Procedure(s): OPEN REDUCTION INTERNAL FIXATION (ORIF) PATELLA  The risks benefits and alternatives were discussed with the patient including but not limited to the risks of nonoperative treatment, versus surgical intervention including infection, bleeding, nerve injury,  blood clots, cardiopulmonary complications, morbidity, mortality,  among others, and they were willing to proceed.     Ventura Bruns, PA-C   07/07/2022 3:05 PM

## 2022-07-07 NOTE — H&P (Signed)
PREOPERATIVE H&P  Chief Complaint: Right knee pain  HPI: Stacy [name] is a 81 y.o. female who presents for preoperative history and physical with a diagnosis of right knee pain. Patient presented to ED today for evaluation of fall.  She was on her way to the [facility] for her morning workout, she stepped down off of a curb and tripped over a concrete barrier in a parking spot landing on her right knee. She had immediate pain at this time and associated swelling to her right knee. X-rays were taken showing a comminuted displaced right patella fracture. This is significantly impairing activities of daily living.  She has elected for surgical management.   Past Medical History:  Diagnosis Date   Atrial fibrillation ([place])    echo 12/23/10 EF= >55%, stress myoview 01/30/11 normal pattern of perfusion in all myocardial regions   Carotid artery stenosis    Per [facility] new patient packet    Chicken pox    Colon polyp    Colon polyp    Dyslipidemia    Heart murmur    Hiatal hernia    Hypothyroidism    Laryngopharyngeal reflux    Osteopenia    Scoliosis    Seasonal allergies    Thyroid disease    Vitamin D deficiency    Past Surgical History:  Procedure Laterality Date   BREAST CYST EXCISION Right 1988   ESOPHAGEAL MANOMETRY N/A 08/29/2015   Procedure: ESOPHAGEAL MANOMETRY (EM);  Surgeon: Manus Gunning, MD;  Location: [facility];  Service: Gastroenterology;  Laterality: N/A;   EYE SURGERY     Cataract eye surgery-right 06/2014, left 04/2014   TONSILLECTOMY     Childhood   Social History   Socioeconomic History   Marital status: Single    Spouse name: Not on file   Number of children: Not on file   Years of education: Not on file   Highest education level: Not on file  Occupational History   Occupation: retired  Tobacco Use   Smoking status: Never   Smokeless tobacco: Never  Substance and Sexual Activity   Alcohol use: Not Currently    Alcohol/week: 1.0 standard drink of  alcohol    Types: 1 Glasses of wine per week    Comment: 1 glass of wine 3x a week   Drug use: No   Sexual activity: Not on file  Other Topics Concern   Not on file  Social History Narrative   Work or School: [employer] Situation: lives alone      Spiritual Beliefs: Jewish      Lifestyle: 3x per [facility] [employer] exercise; diet healthy      Per [facility] New Patient Packet:      Diet: Heart healthy, low-fat      Caffeine: 1 cup of tea daily       Married, if yes what year: Single      Do you live in a house, apartment, assisted living, condo, trailer, ect: Apartment, 1 person, 7 stories       Pets: No      Current/Past profession: PHD, Education officer, museum      Exercise: Yes, Treadmill, weights, and walking          Living Will: Yes   DNR: Yes   POA/HPOA: Yes      Functional Status:   Do you have difficulty bathing or dressing yourself? No   Do you have difficulty preparing food or eating?  No   Do you have difficulty managing your medications? No   Do you have difficulty managing your finances? No   Do you have difficulty affording your medications? No         Social Determinants of Radio broadcast assistant Strain: Not on file  Food Insecurity: Not on file  Transportation Needs: Not on file  Physical Activity: Not on file  Stress: Not on file  Social Connections: Not on file   Family History  Problem Relation Age of Onset   CVA Mother    Hyperlipidemia Mother    Hypertension Mother    Anuerysm Father        brain   Stroke Maternal Grandmother    Hypertension Maternal Grandmother    Hypertension Sister    Scoliosis Sister    Colon cancer Neg Hx    Heart attack Neg Hx    Breast cancer Neg Hx    Allergies  Allergen Reactions   Amoxicillin Swelling   Flonase [Fluticasone Propionate] Swelling    Swelling of throat per patient    Sulfa Antibiotics Other (See Comments)    Upset stomach   Sulfasalazine Other (See Comments)    Upset stomach    Prior to Admission medications   Medication Sig Start Date End Date Taking? Authorizing Provider  aspirin 81 MG tablet Take 81 mg by mouth daily.    [provider]  Cholecalciferol (VITAMIN D-3) 1000 UNITS CAPS Take 1,000 Units by mouth 2 (two) times daily.     [provider]  diltiazem (CARTIA XT) 120 MG 24 hr capsule Take 1 capsule (120 mg total) by mouth at bedtime. 03/19/20   Jettie Booze, MD  irbesartan (AVAPRO) 150 MG tablet Take 150 mg by mouth daily.    [provider]  irbesartan (AVAPRO) 300 MG tablet Take 0.5 tablets (150 mg total) by mouth daily. 12/20/19   Jettie Booze, MD  levothyroxine (SYNTHROID) 112 MCG tablet Take 1 tablet (112 mcg total) by mouth daily before breakfast. 03/08/21   Philemon Kingdom, MD  LORazepam (ATIVAN) 0.5 MG tablet Take 1 tablet (0.5 mg total) by mouth 2 (two) times daily as needed for anxiety. 12/13/20   Mast, Man X, NP  Multiple Vitamin (MULTIVITAMIN) capsule Take 1 capsule by mouth daily.    [provider]  Multiple Vitamins-Minerals (PRESERVISION AREDS PO) Take 1 tablet by mouth daily.    [provider]     Positive ROS: All other systems have been reviewed and were otherwise negative with the exception of those mentioned in the HPI and as above.  Physical Exam: General: Alert, no acute distress Cardiovascular: No pedal edema Respiratory: No cyanosis, no use of accessory musculature GI: No organomegaly, abdomen is soft and non-tender Skin: No lesions in the area of chief complaint Neurologic: Sensation intact distally Psychiatric: Patient is competent for consent with normal mood and affect Lymphatic: No axillary or cervical lymphadenopathy  MUSCULOSKELETAL: patient in knee immobilizer. Significant edema to right knee and ecchymosis. Pain with any movement at knee. EHL and FHL Intact. Distal sensation intact.   X-rays shows a right knee comminuted and diplaced patella  fracture  Assessment: Comminuted and displaced right patella fracture   Plan: Plan for Procedure(s): OPEN REDUCTION INTERNAL FIXATION (ORIF) PATELLA  The risks benefits and alternatives were discussed with the patient including but not limited to the risks of nonoperative treatment, versus surgical intervention including infection, bleeding, nerve injury,  blood clots, cardiopulmonary complications, morbidity, mortality,  among others, and they were willing to proceed.     Ventura Bruns, PA-C   07/07/2022 3:05 PM

## 2022-07-07 NOTE — ED Notes (Signed)
Pt verbalized understanding of all dc instructions Friend is here to drive her to orthopedic surgeon's office for appt today. Knee immobilizer in place and pt is comfortable

## 2022-07-07 NOTE — ED Triage Notes (Signed)
Patient here POV from Home.  Endorses Fall. Occurred approximately 1.5 Hours ago. Tripped over Cement Block. No head Injury. No LOC. No Anticoagulants. Main complaint is to Right Knee. Swelling noted to same.   NAD Noted during Triage. A&Ox4. GCS 15.

## 2022-07-07 NOTE — ED Provider Notes (Signed)
[facility] EMERGENCY DEPT Provider Note   CSN: [id number] Arrival date & time: 07/07/22  1131     History  Chief Complaint  Patient presents with   Lytle Michaels    Stacy [name] is a 81 y.o. female with medical history of heart murmur, hypothyroidism, osteopenia, vitamin D deficiency.  Patient presents to ED for evaluation of fall.  Patient reports that prior to arrival she was on her way to the [facility] for her morning workout.  The patient states that she stepped down off of a curb and tripped over a concrete barrier in a parking spot landing on her right knee.  Patient states she had immediate pain at this time and associated swelling to her right knee.  Patient denies hitting her head, losing consciousness.  The patient denies taking blood thinners.   Fall       Home Medications Prior to Admission medications   Medication Sig Start Date End Date Taking? Authorizing Provider  aspirin 81 MG tablet Take 81 mg by mouth daily.    [provider]  Cholecalciferol (VITAMIN D-3) 1000 UNITS CAPS Take 1,000 Units by mouth 2 (two) times daily.     [provider]  diltiazem (CARTIA XT) 120 MG 24 hr capsule Take 1 capsule (120 mg total) by mouth at bedtime. 03/19/20   Jettie Booze, MD  irbesartan (AVAPRO) 150 MG tablet Take 150 mg by mouth daily.    [provider]  irbesartan (AVAPRO) 300 MG tablet Take 0.5 tablets (150 mg total) by mouth daily. 12/20/19   Jettie Booze, MD  levothyroxine (SYNTHROID) 112 MCG tablet Take 1 tablet (112 mcg total) by mouth daily before breakfast. 03/08/21   Philemon Kingdom, MD  LORazepam (ATIVAN) 0.5 MG tablet Take 1 tablet (0.5 mg total) by mouth 2 (two) times daily as needed for anxiety. 12/13/20   Mast, Man X, NP  Multiple Vitamin (MULTIVITAMIN) capsule Take 1 capsule by mouth daily.    [provider]  Multiple Vitamins-Minerals (PRESERVISION AREDS PO) Take 1 tablet by mouth daily.    [provider]      Allergies    Amoxicillin, Flonase [fluticasone propionate], Sulfa antibiotics, and Sulfasalazine    Review of Systems   Review of Systems  Musculoskeletal:  Positive for arthralgias.  Neurological:  Negative for syncope.  All other systems reviewed and are negative.   Physical Exam Updated Vital Signs BP (!) 166/91 (BP Location: Right Arm)   Pulse 86   Temp 98.2 F (36.8 C)   Resp 18   Ht '5\' 6"'$  (1.676 m)   Wt 59.1 kg   SpO2 98%   BMI 21.03 kg/m  Physical Exam Vitals and nursing note reviewed.  Constitutional:      General: She is not in acute distress.    Appearance: She is well-developed.  HENT:     Head: Normocephalic and atraumatic.  Eyes:     Conjunctiva/sclera: Conjunctivae normal.  Cardiovascular:     Rate and Rhythm: Normal rate and regular rhythm.     Heart sounds: No murmur heard. Pulmonary:     Effort: Pulmonary effort is normal. No respiratory distress.     Breath sounds: Normal breath sounds.  Abdominal:     Palpations: Abdomen is soft.     Tenderness: There is no abdominal tenderness.  Musculoskeletal:        General: No swelling.     Cervical back: Neck supple.     Right knee: Swelling, deformity and  erythema present. Decreased range of motion.     Left knee: Normal.     Comments: Patient right knee with significant swelling, ecchymosis and erythema.  Patient will flex and extend knee actively.  Patient does have slight discomfort with extension of knee.  Skin:    General: Skin is warm and dry.     Capillary Refill: Capillary refill takes less than 2 seconds.  Neurological:     General: No focal deficit present.     Mental Status: She is alert and oriented to person, place, and time.     GCS: GCS eye subscore is 4. GCS verbal subscore is 5. GCS motor subscore is 6.     Cranial Nerves: Cranial nerves 2-12 are intact. No cranial nerve deficit.     Sensory: Sensation is intact. No sensory deficit.     Motor: Motor function is  intact.     Coordination: Coordination is intact.  Psychiatric:        Mood and Affect: Mood normal.     ED Results / Procedures / Treatments   Labs (all labs ordered are listed, but only abnormal results are displayed) Labs Reviewed - No data to display  EKG None  Radiology DG Knee Complete 4 Views Right  Result Date: 07/07/2022 CLINICAL DATA:  Fall. EXAM: RIGHT KNEE - COMPLETE 4+ VIEW COMPARISON:  None available. FINDINGS: Comminuted fracture of the patella. Mild soft tissue swelling overlying the patella. 5 mm distraction of superior and inferior pole of the patella. No additional signs of fracture about the knee. Small joint effusion. IMPRESSION: Comminuted fracture of the patella with 5 mm distraction of the superior and inferior pole of the patella. Electronically Signed   By: Zetta Bills M.D.   On: 07/07/2022 12:00    Procedures Procedures   Medications Ordered in ED Medications - No data to display  ED Course/ Medical Decision Making/ A&P                           Medical Decision Making Amount and/or Complexity of Data Reviewed Radiology: ordered.   81 year old female presents to ED for evaluation.  Please see HPI for further details.  On my examination the patient is afebrile, nontachycardic.  Patient sounds clear bilaterally, she is not hypoxic.  Patient abdomen soft and compressible throughout.  Patient neurological examination shows no focal neurodeficits.  The patient is nontoxic in appearance.  The patient right knee is swollen with slight deformity.  Patient able to flex and extend knee however does have slight discomfort with extension.  Plain film imaging of the patient right knee shows a comminuted fracture of the patella with a 5 mm distraction of the superior and inferior pole the patella.  These findings were discussed with Dr. Mardelle Matte, on-call orthopedic doctor who has advised me to have the patient placed in a knee immobilizer.  Dr. Mardelle Matte stated that  he will see the patient in the clinic later this afternoon.  Patient placed in knee immobilizer, discharged in stable condition.  Patient provided with location and name of Dr. Luanna Cole office.  Patient provided with return precautions and she voiced understanding.  Patient stable.  Final Clinical Impression(s) / ED Diagnoses Final diagnoses:  Closed nondisplaced comminuted fracture of right patella, initial encounter    Rx / DC Orders ED Discharge Orders     None         Azucena Cecil, PA-C 07/07/22 1452  Jeanell Sparrow, DO 07/08/22 [phone]

## 2022-07-07 NOTE — Discharge Instructions (Addendum)
Please report to Dr. Luanna Cole office. Please remain in knee immobilizer until seen by Dr. Mardelle Matte

## 2022-07-08 ENCOUNTER — Other Ambulatory Visit: Payer: Self-pay

## 2022-07-08 ENCOUNTER — Ambulatory Visit (HOSPITAL_COMMUNITY)
Admission: RE | Admit: 2022-07-08 | Discharge: 2022-07-08 | Disposition: A | Discharge status: Home / Self Care | Payer: Medicare HMO | Attending: Orthopedic Surgery | Admitting: Orthopedic Surgery

## 2022-07-08 ENCOUNTER — Encounter (HOSPITAL_BASED_OUTPATIENT_CLINIC_OR_DEPARTMENT_OTHER): Payer: Self-pay | Admitting: Orthopedic Surgery

## 2022-07-08 ENCOUNTER — Encounter (HOSPITAL_BASED_OUTPATIENT_CLINIC_OR_DEPARTMENT_OTHER)
Admission: RE | Disposition: A | Discharge status: Home / Self Care | Payer: Self-pay | Source: Home / Self Care | Attending: Orthopedic Surgery

## 2022-07-08 ENCOUNTER — Ambulatory Visit (HOSPITAL_BASED_OUTPATIENT_CLINIC_OR_DEPARTMENT_OTHER): Payer: Medicare HMO | Admitting: Anesthesiology

## 2022-07-08 ENCOUNTER — Ambulatory Visit (HOSPITAL_BASED_OUTPATIENT_CLINIC_OR_DEPARTMENT_OTHER): Payer: Medicare HMO

## 2022-07-08 DIAGNOSIS — E039 Hypothyroidism, unspecified: Secondary | ICD-10-CM | POA: Insufficient documentation

## 2022-07-08 DIAGNOSIS — I10 Essential (primary) hypertension: Secondary | ICD-10-CM | POA: Diagnosis not present

## 2022-07-08 DIAGNOSIS — S83411A Sprain of medial collateral ligament of right knee, initial encounter: Secondary | ICD-10-CM | POA: Diagnosis not present

## 2022-07-08 DIAGNOSIS — R69 Illness, unspecified: Secondary | ICD-10-CM | POA: Diagnosis not present

## 2022-07-08 DIAGNOSIS — S82091A Other fracture of right patella, initial encounter for closed fracture: Secondary | ICD-10-CM | POA: Diagnosis not present

## 2022-07-08 DIAGNOSIS — F419 Anxiety disorder, unspecified: Secondary | ICD-10-CM | POA: Insufficient documentation

## 2022-07-08 DIAGNOSIS — Y92481 Parking lot as the place of occurrence of the external cause: Secondary | ICD-10-CM | POA: Insufficient documentation

## 2022-07-08 DIAGNOSIS — W01198A Fall on same level from slipping, tripping and stumbling with subsequent striking against other object, initial encounter: Secondary | ICD-10-CM | POA: Insufficient documentation

## 2022-07-08 DIAGNOSIS — S82001A Unspecified fracture of right patella, initial encounter for closed fracture: Secondary | ICD-10-CM

## 2022-07-08 DIAGNOSIS — I4891 Unspecified atrial fibrillation: Secondary | ICD-10-CM | POA: Insufficient documentation

## 2022-07-08 DIAGNOSIS — S82041A Displaced comminuted fracture of right patella, initial encounter for closed fracture: Secondary | ICD-10-CM | POA: Insufficient documentation

## 2022-07-08 DIAGNOSIS — G8918 Other acute postprocedural pain: Secondary | ICD-10-CM | POA: Diagnosis not present

## 2022-07-08 HISTORY — PX: ORIF PATELLA: SHX5033

## 2022-07-08 HISTORY — DX: Essential (primary) hypertension: I10

## 2022-07-08 SURGERY — OPEN REDUCTION INTERNAL FIXATION (ORIF) PATELLA
Anesthesia: General | Site: Knee | Laterality: Right

## 2022-07-08 MED ORDER — ONDANSETRON HCL 4 MG/2ML IJ SOLN
INTRAMUSCULAR | Status: AC
  Filled 2022-07-08: qty 2

## 2022-07-08 MED ORDER — LIDOCAINE-EPINEPHRINE 2 %-1:100000 IJ SOLN
INTRAMUSCULAR | Status: DC | PRN
  Administered 2022-07-08: 5 mL via PERINEURAL

## 2022-07-08 MED ORDER — ACETAMINOPHEN 500 MG PO TABS
1000.0000 mg | ORAL_TABLET | Freq: Once | ORAL | Status: AC
  Administered 2022-07-08: 1000 mg via ORAL

## 2022-07-08 MED ORDER — PHENYLEPHRINE 80 MCG/ML (10ML) SYRINGE FOR IV PUSH (FOR BLOOD PRESSURE SUPPORT)
PREFILLED_SYRINGE | INTRAVENOUS | Status: AC
  Filled 2022-07-08: qty 10

## 2022-07-08 MED ORDER — MIDAZOLAM HCL 2 MG/2ML IJ SOLN
INTRAMUSCULAR | Status: AC
  Filled 2022-07-08: qty 2

## 2022-07-08 MED ORDER — OXYCODONE HCL 5 MG/5ML PO SOLN: 5.0000 mg | Freq: Once | ORAL | Status: DC | PRN

## 2022-07-08 MED ORDER — LIDOCAINE 2% (20 MG/ML) 5 ML SYRINGE
INTRAMUSCULAR | Status: AC
  Filled 2022-07-08: qty 5

## 2022-07-08 MED ORDER — LACTATED RINGERS IV SOLN: INTRAVENOUS | Status: DC

## 2022-07-08 MED ORDER — CHLORHEXIDINE GLUCONATE 4 % EX LIQD: 60.0000 mL | Freq: Once | CUTANEOUS | Status: DC

## 2022-07-08 MED ORDER — ACETAMINOPHEN 160 MG/5ML PO SOLN: 1000.0000 mg | Freq: Once | ORAL | Status: DC | PRN

## 2022-07-08 MED ORDER — ACETAMINOPHEN 10 MG/ML IV SOLN: 1000.0000 mg | Freq: Once | INTRAVENOUS | Status: DC | PRN

## 2022-07-08 MED ORDER — BUPIVACAINE-EPINEPHRINE (PF) 0.5% -1:200000 IJ SOLN
INTRAMUSCULAR | Status: DC | PRN
  Administered 2022-07-08: 20 mL via PERINEURAL

## 2022-07-08 MED ORDER — BACLOFEN 10 MG PO TABS: 10.0000 mg | ORAL_TABLET | Freq: Three times a day (TID) | ORAL | 0 refills | Status: DC

## 2022-07-08 MED ORDER — CEFAZOLIN SODIUM-DEXTROSE 2-4 GM/100ML-% IV SOLN
2.0000 g | INTRAVENOUS | Status: AC
  Administered 2022-07-08: 2 g via INTRAVENOUS

## 2022-07-08 MED ORDER — ONDANSETRON HCL 4 MG PO TABS: 4.0000 mg | ORAL_TABLET | Freq: Three times a day (TID) | ORAL | 0 refills | Status: DC | PRN

## 2022-07-08 MED ORDER — CLONIDINE HCL (ANALGESIA) 100 MCG/ML EP SOLN
EPIDURAL | Status: DC | PRN
  Administered 2022-07-08: 100 ug

## 2022-07-08 MED ORDER — PROPOFOL 500 MG/50ML IV EMUL
INTRAVENOUS | Status: AC
  Filled 2022-07-08: qty 50

## 2022-07-08 MED ORDER — FENTANYL CITRATE (PF) 100 MCG/2ML IJ SOLN: 25.0000 ug | INTRAMUSCULAR | Status: DC | PRN

## 2022-07-08 MED ORDER — ACETAMINOPHEN 500 MG PO TABS: 1000.0000 mg | ORAL_TABLET | Freq: Once | ORAL | Status: DC | PRN

## 2022-07-08 MED ORDER — PHENYLEPHRINE HCL (PRESSORS) 10 MG/ML IV SOLN
INTRAVENOUS | Status: DC | PRN
  Administered 2022-07-08 (×4): 80 ug via INTRAVENOUS

## 2022-07-08 MED ORDER — ACETAMINOPHEN 500 MG PO TABS
ORAL_TABLET | ORAL | Status: AC
  Filled 2022-07-08: qty 2

## 2022-07-08 MED ORDER — CEFAZOLIN SODIUM-DEXTROSE 2-4 GM/100ML-% IV SOLN
INTRAVENOUS | Status: AC
  Filled 2022-07-08: qty 100

## 2022-07-08 MED ORDER — PROPOFOL 10 MG/ML IV BOLUS
INTRAVENOUS | Status: DC | PRN
  Administered 2022-07-08: 50 mg via INTRAVENOUS
  Administered 2022-07-08: 100 mg via INTRAVENOUS
  Administered 2022-07-08: 20 mg via INTRAVENOUS
  Administered 2022-07-08: 50 mg via INTRAVENOUS

## 2022-07-08 MED ORDER — OXYCODONE HCL 5 MG PO TABS: 2.5000 mg | ORAL_TABLET | Freq: Four times a day (QID) | ORAL | 0 refills | Status: DC | PRN

## 2022-07-08 MED ORDER — PROPOFOL 500 MG/50ML IV EMUL
INTRAVENOUS | Status: DC | PRN
  Administered 2022-07-08: 120 ug/kg/min via INTRAVENOUS
  Administered 2022-07-08: 200 ug/kg/min via INTRAVENOUS

## 2022-07-08 MED ORDER — ONDANSETRON HCL 4 MG/2ML IJ SOLN
INTRAMUSCULAR | Status: DC | PRN
  Administered 2022-07-08: 4 mg via INTRAVENOUS

## 2022-07-08 MED ORDER — SENNA-DOCUSATE SODIUM 8.6-50 MG PO TABS: 2.0000 | ORAL_TABLET | Freq: Every day | ORAL | 1 refills | Status: DC

## 2022-07-08 MED ORDER — POVIDONE-IODINE 10 % EX SWAB
2.0000 | Freq: Once | CUTANEOUS | Status: AC
  Administered 2022-07-08: 2 via TOPICAL

## 2022-07-08 MED ORDER — FENTANYL CITRATE (PF) 100 MCG/2ML IJ SOLN
50.0000 ug | Freq: Once | INTRAMUSCULAR | Status: AC
  Administered 2022-07-08: 50 ug via INTRAVENOUS

## 2022-07-08 MED ORDER — 0.9 % SODIUM CHLORIDE (POUR BTL) OPTIME
TOPICAL | Status: DC | PRN
  Administered 2022-07-08: 1000 mL

## 2022-07-08 MED ORDER — LIDOCAINE HCL (CARDIAC) PF 100 MG/5ML IV SOSY
PREFILLED_SYRINGE | INTRAVENOUS | Status: DC | PRN
  Administered 2022-07-08: 100 mg via INTRAVENOUS

## 2022-07-08 MED ORDER — FENTANYL CITRATE (PF) 100 MCG/2ML IJ SOLN
INTRAMUSCULAR | Status: AC
  Filled 2022-07-08: qty 2

## 2022-07-08 MED ORDER — OXYCODONE HCL 5 MG PO TABS: 5.0000 mg | ORAL_TABLET | Freq: Once | ORAL | Status: DC | PRN

## 2022-07-08 SURGICAL SUPPLY — 77 items
BANDAGE ESMARK 6X9 LF (GAUZE/BANDAGES/DRESSINGS) ×1 IMPLANT
BIT DRILL 2.6 CANN (BIT) IMPLANT
BLADE SURG 15 STRL LF DISP TIS (BLADE) ×2 IMPLANT
BLADE SURG 15 STRL SS (BLADE) ×2
BNDG CMPR 9X6 STRL LF SNTH (GAUZE/BANDAGES/DRESSINGS) ×1
BNDG ELASTIC 4X5.8 VLCR STR LF (GAUZE/BANDAGES/DRESSINGS) ×1 IMPLANT
BNDG ELASTIC 6X5.8 VLCR STR LF (GAUZE/BANDAGES/DRESSINGS) ×1 IMPLANT
BNDG ESMARK 6X9 LF (GAUZE/BANDAGES/DRESSINGS) ×1
BNDG GAUZE DERMACEA FLUFF 4 (GAUZE/BANDAGES/DRESSINGS) IMPLANT
BNDG GZE DERMACEA 4 6PLY (GAUZE/BANDAGES/DRESSINGS)
CANISTER SUCT 1200ML W/VALVE (MISCELLANEOUS) ×1 IMPLANT
CLSR STERI-STRIP ANTIMIC 1/2X4 (GAUZE/BANDAGES/DRESSINGS) IMPLANT
CUFF TOURN SGL QUICK 24 (TOURNIQUET CUFF) ×1
CUFF TOURN SGL QUICK 34 (TOURNIQUET CUFF)
CUFF TRNQT CYL 24X4X16.5-23 (TOURNIQUET CUFF) IMPLANT
CUFF TRNQT CYL 34X4.125X (TOURNIQUET CUFF) IMPLANT
DRAPE C-ARM 42X72 X-RAY (DRAPES) ×1 IMPLANT
DRAPE C-ARMOR (DRAPES) ×1 IMPLANT
DRAPE EXTREMITY T 121X128X90 (DISPOSABLE) ×1 IMPLANT
DRAPE INCISE IOBAN 66X45 STRL (DRAPES) IMPLANT
DRAPE OEC MINIVIEW 54X84 (DRAPES) IMPLANT
DRAPE U-SHAPE 47X51 STRL (DRAPES) ×1 IMPLANT
DURAPREP 26ML APPLICATOR (WOUND CARE) ×1 IMPLANT
ELECT REM PT RETURN 9FT ADLT (ELECTROSURGICAL) ×1
ELECTRODE REM PT RTRN 9FT ADLT (ELECTROSURGICAL) ×1 IMPLANT
GAUZE SPONGE 4X4 12PLY STRL (GAUZE/BANDAGES/DRESSINGS) ×1 IMPLANT
GLOVE BIO SURGEON STRL SZ7 (GLOVE) ×1 IMPLANT
GLOVE BIOGEL PI IND STRL 7.0 (GLOVE) ×1 IMPLANT
GLOVE BIOGEL PI IND STRL 8 (GLOVE) ×2 IMPLANT
GLOVE ORTHO TXT STRL SZ7.5 (GLOVE) ×1 IMPLANT
GOWN STRL REUS W/ TWL LRG LVL3 (GOWN DISPOSABLE) ×1 IMPLANT
GOWN STRL REUS W/ TWL XL LVL3 (GOWN DISPOSABLE) ×2 IMPLANT
GOWN STRL REUS W/TWL LRG LVL3 (GOWN DISPOSABLE) ×1
GOWN STRL REUS W/TWL XL LVL3 (GOWN DISPOSABLE) ×2
GUIDEWIRE 1.35MM  DUAL TROCAR (WIRE) ×2
GUIDEWIRE 1.35MM DUAL TROCAR (WIRE) IMPLANT
IMMOBILIZER KNEE 22 UNIV (SOFTGOODS) IMPLANT
IMMOBILIZER KNEE 24 THIGH 36 (MISCELLANEOUS) IMPLANT
IMMOBILIZER KNEE 24 UNIV (MISCELLANEOUS) IMPLANT
NDL KEITH (NEEDLE) IMPLANT
NEEDLE KEITH (NEEDLE) ×1 IMPLANT
NS IRRIG 1000ML POUR BTL (IV SOLUTION) ×1 IMPLANT
PACK ARTHROSCOPY DSU (CUSTOM PROCEDURE TRAY) ×1 IMPLANT
PACK BASIN DAY SURGERY FS (CUSTOM PROCEDURE TRAY) ×1 IMPLANT
PAD CAST 4YDX4 CTTN HI CHSV (CAST SUPPLIES) ×1 IMPLANT
PADDING CAST ABS COTTON 4X4 ST (CAST SUPPLIES) ×1 IMPLANT
PADDING CAST COTTON 4X4 STRL (CAST SUPPLIES) ×1
PENCIL SMOKE EVACUATOR (MISCELLANEOUS) ×1 IMPLANT
SCREW CANN LP BT 4X44 (Screw) IMPLANT
SCREW LO-PRO BLUNT TIP 4X40MM (Screw) IMPLANT
SLEEVE SCD COMPRESS KNEE MED (STOCKING) ×1 IMPLANT
SPIKE FLUID TRANSFER (MISCELLANEOUS) IMPLANT
SPONGE T-LAP 18X18 ~~LOC~~+RFID (SPONGE) ×1 IMPLANT
STAPLER VISISTAT 35W (STAPLE) IMPLANT
SUCTION FRAZIER HANDLE 10FR (MISCELLANEOUS) ×1
SUCTION TUBE FRAZIER 10FR DISP (MISCELLANEOUS) ×1 IMPLANT
SUT ETHILON 3 0 PS 1 (SUTURE) IMPLANT
SUT ETHILON 4 0 PS 2 18 (SUTURE) IMPLANT
SUT FIBERWIRE #2 38 T-5 BLUE (SUTURE) ×2
SUT FIBERWIRE #5 38 CONV NDL (SUTURE)
SUT MNCRL AB 4-0 PS2 18 (SUTURE) IMPLANT
SUT VIC AB 0 CT1 27 (SUTURE) ×1
SUT VIC AB 0 CT1 27XBRD ANBCTR (SUTURE) ×1 IMPLANT
SUT VIC AB 2-0 SH 18 (SUTURE) IMPLANT
SUT VIC AB 2-0 SH 27 (SUTURE)
SUT VIC AB 2-0 SH 27XBRD (SUTURE) IMPLANT
SUT VIC AB 3-0 SH 27 (SUTURE)
SUT VIC AB 3-0 SH 27X BRD (SUTURE) IMPLANT
SUT VICRYL 3-0 CR8 SH (SUTURE) IMPLANT
SUT VICRYL 4-0 PS2 18IN ABS (SUTURE) IMPLANT
SUTURE FIBERWR #2 38 T-5 BLUE (SUTURE) ×2 IMPLANT
SUTURE FIBERWR #5 38 CONV NDL (SUTURE) IMPLANT
SUTURE TAPE 1.3 40 TPR END (SUTURE) IMPLANT
SUTURETAPE 1.3 40 TPR END (SUTURE) ×1
SYR BULB EAR ULCER 3OZ GRN STR (SYRINGE) ×1 IMPLANT
UNDERPAD 30X36 HEAVY ABSORB (UNDERPADS AND DIAPERS) ×1 IMPLANT
YANKAUER SUCT BULB TIP NO VENT (SUCTIONS) IMPLANT

## 2022-07-08 NOTE — Anesthesia Procedure Notes (Signed)
Procedure Name: LMA Insertion Date/Time: 07/08/2022 11:30 AM  Performed by: Maryella Shivers, CRNAPre-anesthesia Checklist: Patient identified, Emergency Drugs available, Suction available and Patient being monitored Patient Re-evaluated:Patient Re-evaluated prior to induction Oxygen Delivery Method: Circle system utilized Preoxygenation: Pre-oxygenation with 100% oxygen Induction Type: IV induction Ventilation: Mask ventilation without difficulty LMA: LMA inserted LMA Size: 4.0 Number of attempts: 1 Airway Equipment and Method: Bite block Placement Confirmation: positive ETCO2 Tube secured with: Tape Dental Injury: Teeth and Oropharynx as per pre-operative assessment

## 2022-07-08 NOTE — Transfer of Care (Signed)
Immediate Anesthesia Transfer of Care Note  Patient: Stacy [name]  Procedure(s) Performed: OPEN REDUCTION INTERNAL FIXATION (ORIF) PATELLA (Right: Knee)  Patient Location: PACU  Anesthesia Type:General and Regional  Level of Consciousness: drowsy  Airway & Oxygen Therapy: Patient Spontanous Breathing and Patient connected to face mask oxygen  Post-op Assessment: Report given to RN and Post -op Vital signs reviewed and stable  Post vital signs: Reviewed and stable  Last Vitals:  Vitals Value Taken Time  BP 109/65 07/08/22 1322  Temp    Pulse 79 07/08/22 1324  Resp 13 07/08/22 1324  SpO2 100 % 07/08/22 1324  Vitals shown include unvalidated device data.  Last Pain:  Vitals:   07/08/22 1041  TempSrc: Oral  PainSc: 0-No pain      Patients Stated Pain Goal: 1 (71/21/97 5883)  Complications: No notable events documented.

## 2022-07-08 NOTE — Interval H&P Note (Signed)
History and Physical Interval Note:  07/08/2022 10:27 AM  Stacy [name]  has presented today for surgery, with the diagnosis of right patella fracture.  The various methods of treatment have been discussed with the patient and family. After consideration of risks, benefits and other options for treatment, the patient has consented to  Procedure(s): OPEN REDUCTION INTERNAL FIXATION (ORIF) PATELLA (Right) as a surgical intervention.  The patient's history has been reviewed, patient examined, no change in status, stable for surgery.  I have reviewed the patient's chart and labs.  Questions were answered to the patient's satisfaction.     [name]

## 2022-07-08 NOTE — Anesthesia Procedure Notes (Signed)
Anesthesia Regional Block: Femoral nerve block   Pre-Anesthetic Checklist: , timeout performed,  Correct Patient, Correct Site, Correct Laterality,  Correct Procedure, Correct Position, site marked,  Risks and benefits discussed,  Surgical consent,  Pre-op evaluation,  At surgeon's request and post-op pain management  Laterality: Right and Lower  Prep: chloraprep       Needles:  Injection technique: Single-shot      Needle Length: 9cm  Needle Gauge: 22     Additional Needles: Arrow StimuQuik ECHO Echogenic Stimulating PNB Needle  Procedures:,,,, ultrasound used (permanent image in chart),,    Narrative:  Start time: 07/08/2022 10:55 AM End time: 07/08/2022 11:02 AM Injection made incrementally with aspirations every 5 mL.  Performed by: Personally  Anesthesiologist: Oleta Mouse, MD

## 2022-07-08 NOTE — Discharge Instructions (Addendum)
Diet: As you were doing prior to hospitalization   Shower:  May shower but keep the wounds dry, use an occlusive plastic wrap, NO SOAKING IN TUB.  If the bandage gets wet, change with a clean dry gauze.  If you have a splint on, leave the splint in place and keep the splint dry with a plastic bag.  Dressing:  You may change your dressing 3-5 days after surgery, unless you have a splint.  If you have a splint, then just leave the splint in place and we will change your bandages during your first follow-up appointment.    If you had hand or foot surgery, we will plan to remove your stitches in about 2 weeks in the office.  For all other surgeries, there are sticky tapes (steri-strips) on your wounds and all the stitches are absorbable.  Leave the steri-strips in place when changing your dressings, they will peel off with time, usually 2-3 weeks.  Activity:  Increase activity slowly as tolerated, but follow the weight bearing instructions below.  The rules on driving is that you can not be taking narcotics while you drive, and you must feel in control of the vehicle.    Weight Bearing:  ok to weight bear as tolerated in your splint  To prevent constipation: you may use a stool softener such as -  Colace (over the counter) 100 mg by mouth twice a day  Drink plenty of fluids (prune juice may be helpful) and high fiber foods Miralax (over the counter) for constipation as needed.    Itching:  If you experience itching with your medications, try taking only a single pain pill, or even half a pain pill at a time.  You may take up to 10 pain pills per day, and you can also use benadryl over the counter for itching or also to help with sleep.   Precautions:  If you experience chest pain or shortness of breath - call 911 immediately for transfer to the hospital emergency department!!  If you develop a fever greater that 101 F, purulent drainage from wound, increased redness or drainage from wound, or calf  pain -- Call the office at [phone]                                                Follow- Up Appointment:  Please call for an appointment to be seen in 2 weeks [place] - [phone]    May have Tylenol today after 4:45 PM   Post Anesthesia Home Care Instructions  Activity: Get plenty of rest for the remainder of the day. A responsible individual must stay with you for 24 hours following the procedure.  For the next 24 hours, DO NOT: -Drive a car -Paediatric nurse -Drink alcoholic beverages -Take any medication unless instructed by your physician -Make any legal decisions or sign important papers.  Meals: Start with liquid foods such as gelatin or soup. Progress to regular foods as tolerated. Avoid greasy, spicy, heavy foods. If nausea and/or vomiting occur, drink only clear liquids until the nausea and/or vomiting subsides. Call your physician if vomiting continues.  Special Instructions/Symptoms: Your throat may feel dry or sore from the anesthesia or the breathing tube placed in your throat during surgery. If this causes discomfort, gargle with warm salt water. The discomfort should disappear within 24 hours.  If you  had a scopolamine patch placed behind your ear for the management of post- operative nausea and/or vomiting:  1. The medication in the patch is effective for 72 hours, after which it should be removed.  Wrap patch in a tissue and discard in the trash. Wash hands thoroughly with soap and water. 2. You may remove the patch earlier than 72 hours if you experience unpleasant side effects which may include dry mouth, dizziness or visual disturbances. 3. Avoid touching the patch. Wash your hands with soap and water after contact with the patch.     Regional Anesthesia Blocks  1. Numbness or the inability to move the "blocked" extremity may last from 3-48 hours after placement. The length of time depends on the medication injected and your individual response to  the medication. If the numbness is not going away after 48 hours, call your surgeon.  2. The extremity that is blocked will need to be protected until the numbness is gone and the  Strength has returned. Because you cannot feel it, you will need to take extra care to avoid injury. Because it may be weak, you may have difficulty moving it or using it. You may not know what position it is in without looking at it while the block is in effect.  3. For blocks in the legs and feet, returning to weight bearing and walking needs to be done carefully. You will need to wait until the numbness is entirely gone and the strength has returned. You should be able to move your leg and foot normally before you try and bear weight or walk. You will need someone to be with you when you first try to ensure you do not fall and possibly risk injury.  4. Bruising and tenderness at the needle site are common side effects and will resolve in a few days.  5. Persistent numbness or new problems with movement should be communicated to the surgeon or the [place] [phone] [place] [phone]).

## 2022-07-08 NOTE — Progress Notes (Signed)
Assisted Dr. Ermalene Postin with right, femoral block. Side rails up, monitors on throughout procedure. See vital signs in flow sheet. Tolerated Procedure well.

## 2022-07-08 NOTE — Op Note (Signed)
07/08/2022  12:49 PM  PATIENT:  Stacy [name]    PRE-OPERATIVE DIAGNOSIS: Comminuted right patella fracture  POST-OPERATIVE DIAGNOSIS:  Same  PROCEDURE:  OPEN REDUCTION INTERNAL FIXATION (ORIF) PATELLA with medial and lateral retinacular repair and 2 views of the right knee  SURGEON:  [name], MD  PHYSICIAN ASSISTANT: Merlene Pulling, PA-C, present and scrubbed throughout the case, critical for completion in a timely fashion, and for retraction, instrumentation, and closure.  ANESTHESIA:   General  ESTIMATED BLOOD LOSS: 75 mL  PREOPERATIVE INDICATIONS:  [name] is a  81 y.o. female with a diagnosis of right patella fracture who elected for surgical management in order to restore the function of the extensor mechanism.    The risks benefits and alternatives were discussed with the patient preoperatively including but not limited to the risks of infection, bleeding, nerve injury, cardiopulmonary complications, the need for revision surgery, hardware prominence, hardware failure, the need for hardware removal, nonunion, malunion, posttraumatic arthritis, stiffness, loss of strength and function, among others, and the patient was willing to proceed.  OPERATIVE IMPLANTS: Arthrex none the nose 4.0 mm cannulated screws x2 with a total of 2 #2 FiberWire going through the cannulated screws in a figure-of-eight cerclage fashion.  I used a suture tape circumferentially around the patella and across both retinaculum for retinacular repair, as well as to augment the fracture fixation because there was a large superomedial piece separate from the primary fracture lines.  OPERATIVE FINDINGS: Displaced patella fracture with slight disruption of the medial and lateral retinaculum with comminution of the patella fracture, with a sizable superomedial piece as well as the 2 primary fracture pieces, and multiple smaller comminuted pieces.  OPERATIVE PROCEDURE: The patient was brought to the  operating room and placed in the supine position. General anesthesia was administered.  She also received a femoral nerve block.  IV antibiotics were given. The lower extremity was prepped and draped in usual sterile fashion. The leg was elevated and exsanguinated and the tourniquet was inflated. Time out was performed.   Anterior incision was made over the patella and the fracture fragments identified and cleaned of hematoma. The retinaculum was torn on either side.  I reduced the fracture anatomically and held provisionally with a clamp and placed 2 guidewires for the cannulated screws.  There were too many fracture lines to completely secure the fracture with only 2 screws, particular because of the superomedial piece, which I basically ignored to allow for suture fixation and fracture fixation around that piece in order to maintain its position.  The lengths were measured, after being confirmed on C-arm, and then I placed the screws, and I did measure them to be bicortical in order to optimize the fixation, particular given her bone quality.  C-arm used to confirm reduction and position of the screws, and once I was satisfied with this I then used a Keith needle through the screws bringing a total of 2 #2 FiberWire in a figure-of-eight type fashion. This provided excellent secondary fixation.   I then placed an additional circumferential suture tape weaving around both retinacular tears, as well as the quadriceps and patellar tendon in order to augment the figure-of-eight cerclage.  This provided reinforcement per tickly for the superomedial piece of bone.  I used 0 Vicryl on the medial and lateral retinaculum for repair there as well.  The wounds were irrigated copiously and the retinaculum repaired with Vicryl followed by Vicryl for the subcutaneous tissue with Steri-Strips and sterile gauze for  the skin. The wounds were also injected.  Long-leg splint was applied.  The patient was awakened and  returned to the PACU in stable and satisfactory condition. There were no complications.

## 2022-07-08 NOTE — Anesthesia Preprocedure Evaluation (Signed)
Anesthesia Evaluation  Patient identified by MRN, date of birth, ID band Patient awake    Reviewed: Allergy & Precautions, H&P , NPO status , Patient's Chart, lab work & pertinent test results  History of Anesthesia Complications Negative for: history of anesthetic complications  Airway Mallampati: III  TM Distance: >3 FB Neck ROM: Full    Dental  (+) Partial Lower, Dental Advisory Given,    Pulmonary neg pulmonary ROS   breath sounds clear to auscultation       Cardiovascular hypertension, Pt. on medications (-) angina (-) Past MI and (-) CHF + dysrhythmias Atrial Fibrillation  Rhythm:Regular  Left ventricle: The cavity size was normal. There was mild    focal basal hypertrophy of the septum. Systolic function    was vigorous. The estimated ejection fraction was in the    range of 65% to 70%. Wall motion was normal; there were no    regional wall motion abnormalities. Doppler parameters are    consistent with abnormal left ventricular relaxation    (grade 1 diastolic dysfunction). Doppler parameters are    consistent with high ventricular filling pressure.  - Aortic valve: Mild regurgitation. Mean gradient: 6m Hg    (S). Peak gradient: 153mHg (S).  - Mitral valve: Calcified annulus.  - Pulmonary arteries: Systolic pressure was mildly    increased. PA peak pressure: 3320mg (S).     Neuro/Psych  PSYCHIATRIC DISORDERS Anxiety     negative neurological ROS     GI/Hepatic Neg liver ROS, hiatal hernia,neg GERD  ,,  Endo/Other  Hypothyroidism    Renal/GU negative Renal ROS     Musculoskeletal Right patella fx   Abdominal   Peds  Hematology negative hematology ROS (+) Lab Results      Component                Value               Date                      WBC                      6.3                 09/30/2020                HGB                      15.3 (H)            09/30/2020                HCT                       45.1                09/30/2020                MCV                      97.6                09/30/2020                PLT                      153  09/30/2020              Anesthesia Other Findings   Reproductive/Obstetrics                             Anesthesia Physical Anesthesia Plan  ASA: 2  Anesthesia Plan: General   Post-op Pain Management: Tylenol PO (pre-op)*   Induction: Intravenous  PONV Risk Score and Plan: 3 and Ondansetron, Dexamethasone, Propofol infusion and TIVA  Airway Management Planned: LMA  Additional Equipment: None  Intra-op Plan:   Post-operative Plan: Extubation in OR  Informed Consent: I have reviewed the patients History and Physical, chart, labs and discussed the procedure including the risks, benefits and alternatives for the proposed anesthesia with the patient or authorized representative who has indicated his/her understanding and acceptance.     Dental advisory given  Plan Discussed with: CRNA  Anesthesia Plan Comments:        Anesthesia Quick Evaluation

## 2022-07-09 NOTE — Progress Notes (Signed)
Left message stating courtesy call and if any questions or concerns please call the doctors office.  

## 2022-07-10 NOTE — Anesthesia Postprocedure Evaluation (Signed)
Anesthesia Post Note  Patient: Stacy [name]  Procedure(s) Performed: OPEN REDUCTION INTERNAL FIXATION (ORIF) PATELLA (Right: Knee)     Patient location during evaluation: PACU Anesthesia Type: General and Regional Level of consciousness: awake and alert Pain management: pain level controlled Vital Signs Assessment: post-procedure vital signs reviewed and stable Respiratory status: spontaneous breathing, nonlabored ventilation and respiratory function stable Cardiovascular status: blood pressure returned to baseline and stable Postop Assessment: no apparent nausea or vomiting Anesthetic complications: no  No notable events documented.  Last Vitals:  Vitals:   07/08/22 1400 07/08/22 1427  BP: 108/64 119/73  Pulse: 80 70  Resp: 14 16  Temp:  36.4 C  SpO2: 95% 96%    Last Pain:  Vitals:   07/08/22 1427  TempSrc:   PainSc: 0-No pain                 Cleatus Goodin

## 2022-07-11 ENCOUNTER — Encounter (HOSPITAL_BASED_OUTPATIENT_CLINIC_OR_DEPARTMENT_OTHER): Payer: Self-pay | Admitting: Orthopedic Surgery

## 2022-07-15 DIAGNOSIS — R2689 Other abnormalities of gait and mobility: Secondary | ICD-10-CM | POA: Diagnosis not present

## 2022-07-15 DIAGNOSIS — M6281 Muscle weakness (generalized): Secondary | ICD-10-CM | POA: Diagnosis not present

## 2022-07-15 DIAGNOSIS — R262 Difficulty in walking, not elsewhere classified: Secondary | ICD-10-CM | POA: Diagnosis not present

## 2022-07-15 DIAGNOSIS — R29898 Other symptoms and signs involving the musculoskeletal system: Secondary | ICD-10-CM | POA: Diagnosis not present

## 2022-07-16 DIAGNOSIS — M6281 Muscle weakness (generalized): Secondary | ICD-10-CM | POA: Diagnosis not present

## 2022-07-16 DIAGNOSIS — R29898 Other symptoms and signs involving the musculoskeletal system: Secondary | ICD-10-CM | POA: Diagnosis not present

## 2022-07-16 DIAGNOSIS — R2689 Other abnormalities of gait and mobility: Secondary | ICD-10-CM | POA: Diagnosis not present

## 2022-07-16 DIAGNOSIS — R262 Difficulty in walking, not elsewhere classified: Secondary | ICD-10-CM | POA: Diagnosis not present

## 2022-07-17 DIAGNOSIS — M6281 Muscle weakness (generalized): Secondary | ICD-10-CM | POA: Diagnosis not present

## 2022-07-17 DIAGNOSIS — R2689 Other abnormalities of gait and mobility: Secondary | ICD-10-CM | POA: Diagnosis not present

## 2022-07-17 DIAGNOSIS — R29898 Other symptoms and signs involving the musculoskeletal system: Secondary | ICD-10-CM | POA: Diagnosis not present

## 2022-07-17 DIAGNOSIS — R262 Difficulty in walking, not elsewhere classified: Secondary | ICD-10-CM | POA: Diagnosis not present

## 2022-07-21 DIAGNOSIS — R29898 Other symptoms and signs involving the musculoskeletal system: Secondary | ICD-10-CM | POA: Diagnosis not present

## 2022-07-21 DIAGNOSIS — M6281 Muscle weakness (generalized): Secondary | ICD-10-CM | POA: Diagnosis not present

## 2022-07-21 DIAGNOSIS — R2689 Other abnormalities of gait and mobility: Secondary | ICD-10-CM | POA: Diagnosis not present

## 2022-07-21 DIAGNOSIS — S82091A Other fracture of right patella, initial encounter for closed fracture: Secondary | ICD-10-CM | POA: Diagnosis not present

## 2022-07-21 DIAGNOSIS — R262 Difficulty in walking, not elsewhere classified: Secondary | ICD-10-CM | POA: Diagnosis not present

## 2022-07-22 DIAGNOSIS — M6281 Muscle weakness (generalized): Secondary | ICD-10-CM | POA: Diagnosis not present

## 2022-07-22 DIAGNOSIS — R2689 Other abnormalities of gait and mobility: Secondary | ICD-10-CM | POA: Diagnosis not present

## 2022-07-22 DIAGNOSIS — R29898 Other symptoms and signs involving the musculoskeletal system: Secondary | ICD-10-CM | POA: Diagnosis not present

## 2022-07-22 DIAGNOSIS — R262 Difficulty in walking, not elsewhere classified: Secondary | ICD-10-CM | POA: Diagnosis not present

## 2022-07-23 DIAGNOSIS — S82091D Other fracture of right patella, subsequent encounter for closed fracture with routine healing: Secondary | ICD-10-CM | POA: Diagnosis not present

## 2022-07-24 ENCOUNTER — Ambulatory Visit: Payer: Medicare HMO | Attending: Internal Medicine | Admitting: Internal Medicine

## 2022-07-24 ENCOUNTER — Encounter: Payer: Self-pay | Admitting: Internal Medicine

## 2022-07-24 VITALS — BP 137/84 | HR 94 | Ht 65.5 in | Wt 131.4 lb

## 2022-07-24 DIAGNOSIS — I10 Essential (primary) hypertension: Secondary | ICD-10-CM

## 2022-07-24 DIAGNOSIS — R2689 Other abnormalities of gait and mobility: Secondary | ICD-10-CM | POA: Diagnosis not present

## 2022-07-24 DIAGNOSIS — I480 Paroxysmal atrial fibrillation: Secondary | ICD-10-CM

## 2022-07-24 DIAGNOSIS — R262 Difficulty in walking, not elsewhere classified: Secondary | ICD-10-CM | POA: Diagnosis not present

## 2022-07-24 DIAGNOSIS — M6281 Muscle weakness (generalized): Secondary | ICD-10-CM | POA: Diagnosis not present

## 2022-07-24 DIAGNOSIS — I6523 Occlusion and stenosis of bilateral carotid arteries: Secondary | ICD-10-CM

## 2022-07-24 DIAGNOSIS — R29898 Other symptoms and signs involving the musculoskeletal system: Secondary | ICD-10-CM | POA: Diagnosis not present

## 2022-07-24 MED ORDER — IRBESARTAN 150 MG PO TABS: 150.0000 mg | ORAL_TABLET | Freq: Every day | ORAL | 3 refills | Status: DC

## 2022-07-24 NOTE — Patient Instructions (Signed)
Medication Instructions:  NO CHANGES  *If you need a refill on your cardiac medications before your next appointment, please call your pharmacy*    Follow-Up: At [facility], you and your health needs are our priority.  As part of our continuing mission to provide you with exceptional heart care, we have created designated Provider Care Teams.  These Care Teams include your primary Cardiologist (physician) and Advanced Practice Providers (APPs -  Physician Assistants and Nurse Practitioners) who all work together to provide you with the care you need, when you need it.  We recommend signing up for the patient portal called "MyChart".  Sign up information is provided on this After Visit Summary.  MyChart is used to connect with patients for Virtual Visits (Telemedicine).  Patients are able to view lab/test results, encounter notes, upcoming appointments, etc.  Non-urgent messages can be sent to your provider as well.   To learn more about what you can do with MyChart, go to [email or web address].    Your next appointment:   6 month(s)  The format for your next appointment:   In Person  Provider:   Lyman Bishop MD

## 2022-07-25 DIAGNOSIS — M6281 Muscle weakness (generalized): Secondary | ICD-10-CM | POA: Diagnosis not present

## 2022-07-25 DIAGNOSIS — R2689 Other abnormalities of gait and mobility: Secondary | ICD-10-CM | POA: Diagnosis not present

## 2022-07-25 DIAGNOSIS — R262 Difficulty in walking, not elsewhere classified: Secondary | ICD-10-CM | POA: Diagnosis not present

## 2022-07-25 DIAGNOSIS — R29898 Other symptoms and signs involving the musculoskeletal system: Secondary | ICD-10-CM | POA: Diagnosis not present

## 2022-07-25 NOTE — Progress Notes (Signed)
OFFICE NOTE  Chief Complaint:  Establish cardiologist  Primary Care Physician: Stacy Congress, NP  HPI:  Stacy [name] is a 81 y.o. female with a past medial history significant for a single episode of paroxysmal atrial fibrillation without recurrence (not anticoagulated), send mild carotid artery stenosis, diagnosis of hypertension and hypothyroidism.  She has been maintained on diltiazem and irbesartan with good blood pressure control.  She has been also noted to have mild aortic insufficiency in the past by echo.  Initial blood pressure today was a little elevated however improved to 130/70 on recheck.  Lipids in October showed total cholesterol 238, HDL 94, triglycerides 64 and LDL 133.  EKG today showed normal sinus rhythm at 94 with left anterior fascicular block.  She resides at friend's home.  Unfortunately she had a recent fall and fractured her patella.  She had to have surgery and will be rehabbing for a while.  PMHx:  Past Medical History:  Diagnosis Date   Atrial fibrillation ([place])    echo 12/23/10 EF= >55%, stress myoview 01/30/11 normal pattern of perfusion in all myocardial regions   Carotid artery stenosis    Per [facility] new patient packet    Chicken pox    Colon polyp    Colon polyp    Dyslipidemia    Heart murmur    Hiatal hernia    Hypertension    Hypothyroidism    Laryngopharyngeal reflux    Osteopenia    Scoliosis    Seasonal allergies    Thyroid disease    Vitamin D deficiency     Past Surgical History:  Procedure Laterality Date   BREAST CYST EXCISION Right 1988   ESOPHAGEAL MANOMETRY N/A 08/29/2015   Procedure: ESOPHAGEAL MANOMETRY (EM);  Surgeon: Manus Gunning, MD;  Location: [facility];  Service: Gastroenterology;  Laterality: N/A;   EYE SURGERY     Cataract eye surgery-right 06/2014, left 04/2014   ORIF PATELLA Right 07/08/2022   Procedure: OPEN REDUCTION INTERNAL FIXATION (ORIF) PATELLA;  Surgeon: Marchia Bond, MD;  Location: [facility];  Service: Orthopedics;  Laterality: Right;   TONSILLECTOMY     Childhood    FAMHx:  Family History  Problem Relation Age of Onset   CVA Mother    Hyperlipidemia Mother    Hypertension Mother    Anuerysm Father        brain   Stroke Maternal Grandmother    Hypertension Maternal Grandmother    Hypertension Sister    Scoliosis Sister    Colon cancer Neg Hx    Heart attack Neg Hx    Breast cancer Neg Hx     SOCHx:   reports that she has never smoked. She has never used smokeless tobacco. She reports that she does not currently use alcohol after a past usage of about 1.0 standard drink of alcohol per week. She reports that she does not use drugs.  ALLERGIES:  Allergies  Allergen Reactions   Amoxicillin Swelling   Flonase [Fluticasone Propionate] Swelling    Swelling of throat per patient    Sulfa Antibiotics Other (See Comments)    Upset stomach   Sulfasalazine Other (See Comments)    Upset stomach    ROS: Pertinent items noted in HPI and remainder of comprehensive ROS otherwise negative.  HOME MEDS: Current Outpatient Medications on File Prior to Visit  Medication Sig Dispense Refill   aspirin 81 MG tablet Take 81 mg by mouth daily.     Cholecalciferol (VITAMIN  D-3) 1000 UNITS CAPS Take 1,000 Units by mouth 2 (two) times daily.      diltiazem (CARTIA XT) 120 MG 24 hr capsule Take 1 capsule (120 mg total) by mouth at bedtime. 90 capsule 2   levothyroxine (SYNTHROID) 112 MCG tablet Take 1 tablet (112 mcg total) by mouth daily before breakfast. 90 tablet 0   LORazepam (ATIVAN) 0.5 MG tablet Take 1 tablet (0.5 mg total) by mouth 2 (two) times daily as needed for anxiety. 30 tablet 2   Multiple Vitamin (MULTIVITAMIN) capsule Take 1 capsule by mouth daily.     Multiple Vitamins-Minerals (PRESERVISION AREDS PO) Take 1 tablet by mouth daily.     sennosides-docusate sodium (SENOKOT-S) 8.6-50 MG tablet Take 2 tablets by mouth daily. While taking pain  medication. 30 tablet 1   ondansetron (ZOFRAN) 4 MG tablet Take 1 tablet (4 mg total) by mouth every 8 (eight) hours as needed for nausea or vomiting. (Patient not taking: Reported on 07/24/2022) 10 tablet 0   oxyCODONE (ROXICODONE) 5 MG immediate release tablet Take 0.5-1 tablets (2.5-5 mg total) by mouth every 6 (six) hours as needed for severe pain. (Patient not taking: Reported on 07/24/2022) 20 tablet 0   No current facility-administered medications on file prior to visit.    LABS/IMAGING: No results found for this or any previous visit (from the past 48 hour(s)). No results found.  LIPID PANEL:    Component Value Date/Time   CHOL 272 (H) 03/12/2020 1424   TRIG 64 03/12/2020 1424   HDL 83 03/12/2020 1424   CHOLHDL 3.3 03/12/2020 1424   VLDL 13.2 12/06/2018 0937   LDLCALC 173 (H) 03/12/2020 1424   LDLDIRECT 148.1 08/17/2013 1143     WEIGHTS: Wt Readings from Last 3 Encounters:  07/24/22 131 lb 6.4 oz (59.6 kg)  07/08/22 122 lb (55.3 kg)  07/07/22 130 lb 4.7 oz (59.1 kg)    VITALS: BP 137/84   Pulse 94   Ht 5' 5.5" (1.664 m)   Wt 131 lb 6.4 oz (59.6 kg)   SpO2 97%   BMI 21.53 kg/m   EXAM: General appearance: alert and no distress Neck: no carotid bruit, no JVD, and thyroid not enlarged, symmetric, no tenderness/mass/nodules Lungs: clear to auscultation bilaterally Heart: regular rate and rhythm, S1, S2 normal, no murmur, click, rub or gallop Abdomen: soft, non-tender; bowel sounds normal; no masses,  no organomegaly Extremities: Right lower extremity is casted Pulses: 2+ and symmetric Skin: Skin color, texture, turgor normal. No rashes or lesions Neurologic: Grossly normal Psych: Pleasant  EKG: Normal sinus rhythm at 94, left anterior fascicular block- personally reviewed  ASSESSMENT: Remote PAF, no recurrence-on aspirin Hypertension Mild aortic insufficiency Hypothyroidism Bilateral carotid artery stenosis  PLAN: 1.   [name] has not no recurrent  atrial fibrillation.  Unfortunately she had a recent fall and is in rehabilitation for a patellar fracture.  Blood pressure appears to be well-controlled.  I would advise she continue both diltiazem and her irbesartan.  She did have some mild aortic insufficiency on echo in 2014 but did not have a significant murmur on exam.  She had some mild bilateral carotid artery stenosis.  Follow-up was recommended in 1 year however she tells me that she was advised that this was not necessary.  I think we should consider repeat imaging at some point to make sure this is not progressing.  Will plan to follow-up in 6 months or sooner as necessary.  Pixie Casino, MD, [facility], FACP  [place] Director of the Advanced Lipid Disorders &  Cardiovascular Risk Reduction Clinic Diplomate of the American Board of Clinical Lipidology Attending Cardiologist  Direct Dial: [phone]  Fax: [phone]  Website:  [email or web address] [email or web address] Lashannon Bresnan 07/25/2022, 1:15 PM

## 2022-07-30 DIAGNOSIS — R29898 Other symptoms and signs involving the musculoskeletal system: Secondary | ICD-10-CM | POA: Diagnosis not present

## 2022-07-30 DIAGNOSIS — M6281 Muscle weakness (generalized): Secondary | ICD-10-CM | POA: Diagnosis not present

## 2022-07-30 DIAGNOSIS — R262 Difficulty in walking, not elsewhere classified: Secondary | ICD-10-CM | POA: Diagnosis not present

## 2022-07-30 DIAGNOSIS — R2689 Other abnormalities of gait and mobility: Secondary | ICD-10-CM | POA: Diagnosis not present

## 2022-07-31 DIAGNOSIS — R262 Difficulty in walking, not elsewhere classified: Secondary | ICD-10-CM | POA: Diagnosis not present

## 2022-07-31 DIAGNOSIS — M6281 Muscle weakness (generalized): Secondary | ICD-10-CM | POA: Diagnosis not present

## 2022-07-31 DIAGNOSIS — R2689 Other abnormalities of gait and mobility: Secondary | ICD-10-CM | POA: Diagnosis not present

## 2022-07-31 DIAGNOSIS — R29898 Other symptoms and signs involving the musculoskeletal system: Secondary | ICD-10-CM | POA: Diagnosis not present

## 2022-08-01 DIAGNOSIS — M6281 Muscle weakness (generalized): Secondary | ICD-10-CM | POA: Diagnosis not present

## 2022-08-01 DIAGNOSIS — R2689 Other abnormalities of gait and mobility: Secondary | ICD-10-CM | POA: Diagnosis not present

## 2022-08-01 DIAGNOSIS — R262 Difficulty in walking, not elsewhere classified: Secondary | ICD-10-CM | POA: Diagnosis not present

## 2022-08-01 DIAGNOSIS — R29898 Other symptoms and signs involving the musculoskeletal system: Secondary | ICD-10-CM | POA: Diagnosis not present

## 2022-08-05 DIAGNOSIS — M6281 Muscle weakness (generalized): Secondary | ICD-10-CM | POA: Diagnosis not present

## 2022-08-05 DIAGNOSIS — R262 Difficulty in walking, not elsewhere classified: Secondary | ICD-10-CM | POA: Diagnosis not present

## 2022-08-05 DIAGNOSIS — R29898 Other symptoms and signs involving the musculoskeletal system: Secondary | ICD-10-CM | POA: Diagnosis not present

## 2022-08-05 DIAGNOSIS — R2689 Other abnormalities of gait and mobility: Secondary | ICD-10-CM | POA: Diagnosis not present

## 2022-08-05 DIAGNOSIS — R2681 Unsteadiness on feet: Secondary | ICD-10-CM | POA: Diagnosis not present

## 2022-08-06 DIAGNOSIS — R2681 Unsteadiness on feet: Secondary | ICD-10-CM | POA: Diagnosis not present

## 2022-08-06 DIAGNOSIS — R262 Difficulty in walking, not elsewhere classified: Secondary | ICD-10-CM | POA: Diagnosis not present

## 2022-08-06 DIAGNOSIS — R29898 Other symptoms and signs involving the musculoskeletal system: Secondary | ICD-10-CM | POA: Diagnosis not present

## 2022-08-06 DIAGNOSIS — M6281 Muscle weakness (generalized): Secondary | ICD-10-CM | POA: Diagnosis not present

## 2022-08-06 DIAGNOSIS — R2689 Other abnormalities of gait and mobility: Secondary | ICD-10-CM | POA: Diagnosis not present

## 2022-08-07 DIAGNOSIS — R2689 Other abnormalities of gait and mobility: Secondary | ICD-10-CM | POA: Diagnosis not present

## 2022-08-07 DIAGNOSIS — R2681 Unsteadiness on feet: Secondary | ICD-10-CM | POA: Diagnosis not present

## 2022-08-07 DIAGNOSIS — R262 Difficulty in walking, not elsewhere classified: Secondary | ICD-10-CM | POA: Diagnosis not present

## 2022-08-07 DIAGNOSIS — R29898 Other symptoms and signs involving the musculoskeletal system: Secondary | ICD-10-CM | POA: Diagnosis not present

## 2022-08-07 DIAGNOSIS — M6281 Muscle weakness (generalized): Secondary | ICD-10-CM | POA: Diagnosis not present

## 2022-08-08 DIAGNOSIS — R2681 Unsteadiness on feet: Secondary | ICD-10-CM | POA: Diagnosis not present

## 2022-08-08 DIAGNOSIS — M6281 Muscle weakness (generalized): Secondary | ICD-10-CM | POA: Diagnosis not present

## 2022-08-08 DIAGNOSIS — R29898 Other symptoms and signs involving the musculoskeletal system: Secondary | ICD-10-CM | POA: Diagnosis not present

## 2022-08-08 DIAGNOSIS — R262 Difficulty in walking, not elsewhere classified: Secondary | ICD-10-CM | POA: Diagnosis not present

## 2022-08-08 DIAGNOSIS — R2689 Other abnormalities of gait and mobility: Secondary | ICD-10-CM | POA: Diagnosis not present

## 2022-08-11 DIAGNOSIS — R2689 Other abnormalities of gait and mobility: Secondary | ICD-10-CM | POA: Diagnosis not present

## 2022-08-11 DIAGNOSIS — R2681 Unsteadiness on feet: Secondary | ICD-10-CM | POA: Diagnosis not present

## 2022-08-11 DIAGNOSIS — M6281 Muscle weakness (generalized): Secondary | ICD-10-CM | POA: Diagnosis not present

## 2022-08-11 DIAGNOSIS — R262 Difficulty in walking, not elsewhere classified: Secondary | ICD-10-CM | POA: Diagnosis not present

## 2022-08-11 DIAGNOSIS — R29898 Other symptoms and signs involving the musculoskeletal system: Secondary | ICD-10-CM | POA: Diagnosis not present

## 2022-08-14 DIAGNOSIS — R2689 Other abnormalities of gait and mobility: Secondary | ICD-10-CM | POA: Diagnosis not present

## 2022-08-14 DIAGNOSIS — M6281 Muscle weakness (generalized): Secondary | ICD-10-CM | POA: Diagnosis not present

## 2022-08-14 DIAGNOSIS — R29898 Other symptoms and signs involving the musculoskeletal system: Secondary | ICD-10-CM | POA: Diagnosis not present

## 2022-08-14 DIAGNOSIS — R2681 Unsteadiness on feet: Secondary | ICD-10-CM | POA: Diagnosis not present

## 2022-08-14 DIAGNOSIS — R262 Difficulty in walking, not elsewhere classified: Secondary | ICD-10-CM | POA: Diagnosis not present

## 2022-08-18 DIAGNOSIS — R29898 Other symptoms and signs involving the musculoskeletal system: Secondary | ICD-10-CM | POA: Diagnosis not present

## 2022-08-18 DIAGNOSIS — S82091D Other fracture of right patella, subsequent encounter for closed fracture with routine healing: Secondary | ICD-10-CM | POA: Diagnosis not present

## 2022-08-18 DIAGNOSIS — R2681 Unsteadiness on feet: Secondary | ICD-10-CM | POA: Diagnosis not present

## 2022-08-18 DIAGNOSIS — R262 Difficulty in walking, not elsewhere classified: Secondary | ICD-10-CM | POA: Diagnosis not present

## 2022-08-18 DIAGNOSIS — M6281 Muscle weakness (generalized): Secondary | ICD-10-CM | POA: Diagnosis not present

## 2022-08-18 DIAGNOSIS — R2689 Other abnormalities of gait and mobility: Secondary | ICD-10-CM | POA: Diagnosis not present

## 2022-08-20 DIAGNOSIS — M6281 Muscle weakness (generalized): Secondary | ICD-10-CM | POA: Diagnosis not present

## 2022-08-20 DIAGNOSIS — R29898 Other symptoms and signs involving the musculoskeletal system: Secondary | ICD-10-CM | POA: Diagnosis not present

## 2022-08-20 DIAGNOSIS — R2689 Other abnormalities of gait and mobility: Secondary | ICD-10-CM | POA: Diagnosis not present

## 2022-08-20 DIAGNOSIS — R262 Difficulty in walking, not elsewhere classified: Secondary | ICD-10-CM | POA: Diagnosis not present

## 2022-08-20 DIAGNOSIS — R2681 Unsteadiness on feet: Secondary | ICD-10-CM | POA: Diagnosis not present

## 2022-08-21 DIAGNOSIS — R2689 Other abnormalities of gait and mobility: Secondary | ICD-10-CM | POA: Diagnosis not present

## 2022-08-21 DIAGNOSIS — M6281 Muscle weakness (generalized): Secondary | ICD-10-CM | POA: Diagnosis not present

## 2022-08-21 DIAGNOSIS — R2681 Unsteadiness on feet: Secondary | ICD-10-CM | POA: Diagnosis not present

## 2022-08-21 DIAGNOSIS — R262 Difficulty in walking, not elsewhere classified: Secondary | ICD-10-CM | POA: Diagnosis not present

## 2022-08-21 DIAGNOSIS — R29898 Other symptoms and signs involving the musculoskeletal system: Secondary | ICD-10-CM | POA: Diagnosis not present

## 2022-08-25 DIAGNOSIS — R29898 Other symptoms and signs involving the musculoskeletal system: Secondary | ICD-10-CM | POA: Diagnosis not present

## 2022-08-25 DIAGNOSIS — M6281 Muscle weakness (generalized): Secondary | ICD-10-CM | POA: Diagnosis not present

## 2022-08-25 DIAGNOSIS — R2681 Unsteadiness on feet: Secondary | ICD-10-CM | POA: Diagnosis not present

## 2022-08-25 DIAGNOSIS — R262 Difficulty in walking, not elsewhere classified: Secondary | ICD-10-CM | POA: Diagnosis not present

## 2022-08-25 DIAGNOSIS — R2689 Other abnormalities of gait and mobility: Secondary | ICD-10-CM | POA: Diagnosis not present

## 2022-08-27 DIAGNOSIS — R2689 Other abnormalities of gait and mobility: Secondary | ICD-10-CM | POA: Diagnosis not present

## 2022-08-27 DIAGNOSIS — M6281 Muscle weakness (generalized): Secondary | ICD-10-CM | POA: Diagnosis not present

## 2022-08-27 DIAGNOSIS — R262 Difficulty in walking, not elsewhere classified: Secondary | ICD-10-CM | POA: Diagnosis not present

## 2022-08-27 DIAGNOSIS — R2681 Unsteadiness on feet: Secondary | ICD-10-CM | POA: Diagnosis not present

## 2022-08-27 DIAGNOSIS — R29898 Other symptoms and signs involving the musculoskeletal system: Secondary | ICD-10-CM | POA: Diagnosis not present

## 2022-08-28 DIAGNOSIS — M6281 Muscle weakness (generalized): Secondary | ICD-10-CM | POA: Diagnosis not present

## 2022-08-28 DIAGNOSIS — R2689 Other abnormalities of gait and mobility: Secondary | ICD-10-CM | POA: Diagnosis not present

## 2022-08-28 DIAGNOSIS — R2681 Unsteadiness on feet: Secondary | ICD-10-CM | POA: Diagnosis not present

## 2022-08-28 DIAGNOSIS — R262 Difficulty in walking, not elsewhere classified: Secondary | ICD-10-CM | POA: Diagnosis not present

## 2022-08-28 DIAGNOSIS — R29898 Other symptoms and signs involving the musculoskeletal system: Secondary | ICD-10-CM | POA: Diagnosis not present

## 2022-09-01 ENCOUNTER — Telehealth: Payer: Self-pay | Admitting: Internal Medicine

## 2022-09-01 DIAGNOSIS — R262 Difficulty in walking, not elsewhere classified: Secondary | ICD-10-CM | POA: Diagnosis not present

## 2022-09-01 DIAGNOSIS — M6281 Muscle weakness (generalized): Secondary | ICD-10-CM | POA: Diagnosis not present

## 2022-09-01 DIAGNOSIS — R29898 Other symptoms and signs involving the musculoskeletal system: Secondary | ICD-10-CM | POA: Diagnosis not present

## 2022-09-01 DIAGNOSIS — R2681 Unsteadiness on feet: Secondary | ICD-10-CM | POA: Diagnosis not present

## 2022-09-01 DIAGNOSIS — R2689 Other abnormalities of gait and mobility: Secondary | ICD-10-CM | POA: Diagnosis not present

## 2022-09-01 NOTE — Telephone Encounter (Signed)
*  STAT* If patient is at the pharmacy, call can be transferred to refill team.   1. Which medications need to be refilled? (please list name of each medication and dose if known)   diltiazem (CARTIA XT) 120 MG 24 hr capsule    2. Which pharmacy/location (including street and city if local pharmacy) is medication to be sent to? [id number]- [place], [place] - [address]   3. Do they need a 30 day or 90 day supply? 90 day

## 2022-09-03 DIAGNOSIS — R29898 Other symptoms and signs involving the musculoskeletal system: Secondary | ICD-10-CM | POA: Diagnosis not present

## 2022-09-03 DIAGNOSIS — R2689 Other abnormalities of gait and mobility: Secondary | ICD-10-CM | POA: Diagnosis not present

## 2022-09-03 DIAGNOSIS — R2681 Unsteadiness on feet: Secondary | ICD-10-CM | POA: Diagnosis not present

## 2022-09-03 DIAGNOSIS — R262 Difficulty in walking, not elsewhere classified: Secondary | ICD-10-CM | POA: Diagnosis not present

## 2022-09-03 DIAGNOSIS — M6281 Muscle weakness (generalized): Secondary | ICD-10-CM | POA: Diagnosis not present

## 2022-09-03 MED ORDER — DILTIAZEM HCL ER COATED BEADS 120 MG PO CP24: 120.0000 mg | ORAL_CAPSULE | Freq: Every day | ORAL | 1 refills | Status: DC

## 2022-09-04 DIAGNOSIS — R2681 Unsteadiness on feet: Secondary | ICD-10-CM | POA: Diagnosis not present

## 2022-09-04 DIAGNOSIS — M6281 Muscle weakness (generalized): Secondary | ICD-10-CM | POA: Diagnosis not present

## 2022-09-04 DIAGNOSIS — R29898 Other symptoms and signs involving the musculoskeletal system: Secondary | ICD-10-CM | POA: Diagnosis not present

## 2022-09-08 DIAGNOSIS — R2681 Unsteadiness on feet: Secondary | ICD-10-CM | POA: Diagnosis not present

## 2022-09-08 DIAGNOSIS — M6281 Muscle weakness (generalized): Secondary | ICD-10-CM | POA: Diagnosis not present

## 2022-09-08 DIAGNOSIS — R29898 Other symptoms and signs involving the musculoskeletal system: Secondary | ICD-10-CM | POA: Diagnosis not present

## 2022-09-10 DIAGNOSIS — R2681 Unsteadiness on feet: Secondary | ICD-10-CM | POA: Diagnosis not present

## 2022-09-10 DIAGNOSIS — R29898 Other symptoms and signs involving the musculoskeletal system: Secondary | ICD-10-CM | POA: Diagnosis not present

## 2022-09-10 DIAGNOSIS — M6281 Muscle weakness (generalized): Secondary | ICD-10-CM | POA: Diagnosis not present

## 2022-09-11 DIAGNOSIS — R2681 Unsteadiness on feet: Secondary | ICD-10-CM | POA: Diagnosis not present

## 2022-09-11 DIAGNOSIS — M6281 Muscle weakness (generalized): Secondary | ICD-10-CM | POA: Diagnosis not present

## 2022-09-11 DIAGNOSIS — R29898 Other symptoms and signs involving the musculoskeletal system: Secondary | ICD-10-CM | POA: Diagnosis not present

## 2022-09-15 DIAGNOSIS — S82091D Other fracture of right patella, subsequent encounter for closed fracture with routine healing: Secondary | ICD-10-CM | POA: Diagnosis not present

## 2022-09-15 DIAGNOSIS — R29898 Other symptoms and signs involving the musculoskeletal system: Secondary | ICD-10-CM | POA: Diagnosis not present

## 2022-09-15 DIAGNOSIS — M6281 Muscle weakness (generalized): Secondary | ICD-10-CM | POA: Diagnosis not present

## 2022-09-15 DIAGNOSIS — R2681 Unsteadiness on feet: Secondary | ICD-10-CM | POA: Diagnosis not present

## 2022-09-17 DIAGNOSIS — M6281 Muscle weakness (generalized): Secondary | ICD-10-CM | POA: Diagnosis not present

## 2022-09-17 DIAGNOSIS — R2681 Unsteadiness on feet: Secondary | ICD-10-CM | POA: Diagnosis not present

## 2022-09-17 DIAGNOSIS — R29898 Other symptoms and signs involving the musculoskeletal system: Secondary | ICD-10-CM | POA: Diagnosis not present

## 2022-09-18 DIAGNOSIS — R2681 Unsteadiness on feet: Secondary | ICD-10-CM | POA: Diagnosis not present

## 2022-09-18 DIAGNOSIS — M6281 Muscle weakness (generalized): Secondary | ICD-10-CM | POA: Diagnosis not present

## 2022-09-18 DIAGNOSIS — R29898 Other symptoms and signs involving the musculoskeletal system: Secondary | ICD-10-CM | POA: Diagnosis not present

## 2022-09-23 DIAGNOSIS — M6281 Muscle weakness (generalized): Secondary | ICD-10-CM | POA: Diagnosis not present

## 2022-09-23 DIAGNOSIS — R2681 Unsteadiness on feet: Secondary | ICD-10-CM | POA: Diagnosis not present

## 2022-09-23 DIAGNOSIS — R29898 Other symptoms and signs involving the musculoskeletal system: Secondary | ICD-10-CM | POA: Diagnosis not present

## 2022-09-24 DIAGNOSIS — R29898 Other symptoms and signs involving the musculoskeletal system: Secondary | ICD-10-CM | POA: Diagnosis not present

## 2022-09-24 DIAGNOSIS — M6281 Muscle weakness (generalized): Secondary | ICD-10-CM | POA: Diagnosis not present

## 2022-09-24 DIAGNOSIS — R2681 Unsteadiness on feet: Secondary | ICD-10-CM | POA: Diagnosis not present

## 2022-09-29 DIAGNOSIS — R2681 Unsteadiness on feet: Secondary | ICD-10-CM | POA: Diagnosis not present

## 2022-09-29 DIAGNOSIS — R29898 Other symptoms and signs involving the musculoskeletal system: Secondary | ICD-10-CM | POA: Diagnosis not present

## 2022-09-29 DIAGNOSIS — M6281 Muscle weakness (generalized): Secondary | ICD-10-CM | POA: Diagnosis not present

## 2022-10-01 DIAGNOSIS — R29898 Other symptoms and signs involving the musculoskeletal system: Secondary | ICD-10-CM | POA: Diagnosis not present

## 2022-10-01 DIAGNOSIS — R2681 Unsteadiness on feet: Secondary | ICD-10-CM | POA: Diagnosis not present

## 2022-10-01 DIAGNOSIS — M6281 Muscle weakness (generalized): Secondary | ICD-10-CM | POA: Diagnosis not present

## 2022-10-02 DIAGNOSIS — R29898 Other symptoms and signs involving the musculoskeletal system: Secondary | ICD-10-CM | POA: Diagnosis not present

## 2022-10-02 DIAGNOSIS — M6281 Muscle weakness (generalized): Secondary | ICD-10-CM | POA: Diagnosis not present

## 2022-10-02 DIAGNOSIS — R2681 Unsteadiness on feet: Secondary | ICD-10-CM | POA: Diagnosis not present

## 2022-10-06 DIAGNOSIS — R29898 Other symptoms and signs involving the musculoskeletal system: Secondary | ICD-10-CM | POA: Diagnosis not present

## 2022-10-06 DIAGNOSIS — M6281 Muscle weakness (generalized): Secondary | ICD-10-CM | POA: Diagnosis not present

## 2022-10-06 DIAGNOSIS — R2681 Unsteadiness on feet: Secondary | ICD-10-CM | POA: Diagnosis not present

## 2022-10-08 DIAGNOSIS — M6281 Muscle weakness (generalized): Secondary | ICD-10-CM | POA: Diagnosis not present

## 2022-10-08 DIAGNOSIS — R2681 Unsteadiness on feet: Secondary | ICD-10-CM | POA: Diagnosis not present

## 2022-10-08 DIAGNOSIS — R29898 Other symptoms and signs involving the musculoskeletal system: Secondary | ICD-10-CM | POA: Diagnosis not present

## 2022-10-09 DIAGNOSIS — R29898 Other symptoms and signs involving the musculoskeletal system: Secondary | ICD-10-CM | POA: Diagnosis not present

## 2022-10-09 DIAGNOSIS — R2681 Unsteadiness on feet: Secondary | ICD-10-CM | POA: Diagnosis not present

## 2022-10-09 DIAGNOSIS — M6281 Muscle weakness (generalized): Secondary | ICD-10-CM | POA: Diagnosis not present

## 2022-10-13 DIAGNOSIS — M6281 Muscle weakness (generalized): Secondary | ICD-10-CM | POA: Diagnosis not present

## 2022-10-13 DIAGNOSIS — S82091D Other fracture of right patella, subsequent encounter for closed fracture with routine healing: Secondary | ICD-10-CM | POA: Diagnosis not present

## 2022-10-13 DIAGNOSIS — R29898 Other symptoms and signs involving the musculoskeletal system: Secondary | ICD-10-CM | POA: Diagnosis not present

## 2022-10-13 DIAGNOSIS — R2681 Unsteadiness on feet: Secondary | ICD-10-CM | POA: Diagnosis not present

## 2022-10-15 DIAGNOSIS — M6281 Muscle weakness (generalized): Secondary | ICD-10-CM | POA: Diagnosis not present

## 2022-10-15 DIAGNOSIS — R2681 Unsteadiness on feet: Secondary | ICD-10-CM | POA: Diagnosis not present

## 2022-10-15 DIAGNOSIS — R29898 Other symptoms and signs involving the musculoskeletal system: Secondary | ICD-10-CM | POA: Diagnosis not present

## 2022-10-16 DIAGNOSIS — R2681 Unsteadiness on feet: Secondary | ICD-10-CM | POA: Diagnosis not present

## 2022-10-16 DIAGNOSIS — M6281 Muscle weakness (generalized): Secondary | ICD-10-CM | POA: Diagnosis not present

## 2022-10-16 DIAGNOSIS — R29898 Other symptoms and signs involving the musculoskeletal system: Secondary | ICD-10-CM | POA: Diagnosis not present

## 2022-10-20 DIAGNOSIS — M6281 Muscle weakness (generalized): Secondary | ICD-10-CM | POA: Diagnosis not present

## 2022-10-20 DIAGNOSIS — R29898 Other symptoms and signs involving the musculoskeletal system: Secondary | ICD-10-CM | POA: Diagnosis not present

## 2022-10-20 DIAGNOSIS — R2681 Unsteadiness on feet: Secondary | ICD-10-CM | POA: Diagnosis not present

## 2022-10-22 DIAGNOSIS — R29898 Other symptoms and signs involving the musculoskeletal system: Secondary | ICD-10-CM | POA: Diagnosis not present

## 2022-10-22 DIAGNOSIS — R2681 Unsteadiness on feet: Secondary | ICD-10-CM | POA: Diagnosis not present

## 2022-10-22 DIAGNOSIS — M6281 Muscle weakness (generalized): Secondary | ICD-10-CM | POA: Diagnosis not present

## 2022-10-27 DIAGNOSIS — M6281 Muscle weakness (generalized): Secondary | ICD-10-CM | POA: Diagnosis not present

## 2022-10-27 DIAGNOSIS — R2681 Unsteadiness on feet: Secondary | ICD-10-CM | POA: Diagnosis not present

## 2022-10-27 DIAGNOSIS — R29898 Other symptoms and signs involving the musculoskeletal system: Secondary | ICD-10-CM | POA: Diagnosis not present

## 2022-10-29 DIAGNOSIS — R2681 Unsteadiness on feet: Secondary | ICD-10-CM | POA: Diagnosis not present

## 2022-10-29 DIAGNOSIS — M6281 Muscle weakness (generalized): Secondary | ICD-10-CM | POA: Diagnosis not present

## 2022-10-29 DIAGNOSIS — R29898 Other symptoms and signs involving the musculoskeletal system: Secondary | ICD-10-CM | POA: Diagnosis not present

## 2022-12-16 DIAGNOSIS — L570 Actinic keratosis: Secondary | ICD-10-CM | POA: Diagnosis not present

## 2022-12-16 DIAGNOSIS — L821 Other seborrheic keratosis: Secondary | ICD-10-CM | POA: Diagnosis not present

## 2022-12-16 DIAGNOSIS — L820 Inflamed seborrheic keratosis: Secondary | ICD-10-CM | POA: Diagnosis not present

## 2022-12-16 DIAGNOSIS — L814 Other melanin hyperpigmentation: Secondary | ICD-10-CM | POA: Diagnosis not present

## 2023-01-12 DIAGNOSIS — S82091D Other fracture of right patella, subsequent encounter for closed fracture with routine healing: Secondary | ICD-10-CM | POA: Diagnosis not present

## 2023-01-13 DIAGNOSIS — M2042 Other hammer toe(s) (acquired), left foot: Secondary | ICD-10-CM | POA: Diagnosis not present

## 2023-01-13 DIAGNOSIS — M79672 Pain in left foot: Secondary | ICD-10-CM | POA: Diagnosis not present

## 2023-01-13 DIAGNOSIS — M2041 Other hammer toe(s) (acquired), right foot: Secondary | ICD-10-CM | POA: Diagnosis not present

## 2023-01-13 DIAGNOSIS — B351 Tinea unguium: Secondary | ICD-10-CM | POA: Diagnosis not present

## 2023-01-20 ENCOUNTER — Encounter (HOSPITAL_BASED_OUTPATIENT_CLINIC_OR_DEPARTMENT_OTHER): Payer: Self-pay | Admitting: Internal Medicine

## 2023-01-20 ENCOUNTER — Ambulatory Visit (HOSPITAL_BASED_OUTPATIENT_CLINIC_OR_DEPARTMENT_OTHER): Payer: Medicare HMO | Admitting: Internal Medicine

## 2023-01-20 VITALS — BP 189/97 | HR 77 | Ht 65.5 in | Wt 122.8 lb

## 2023-01-20 DIAGNOSIS — I480 Paroxysmal atrial fibrillation: Secondary | ICD-10-CM | POA: Diagnosis not present

## 2023-01-20 DIAGNOSIS — I6523 Occlusion and stenosis of bilateral carotid arteries: Secondary | ICD-10-CM

## 2023-01-20 DIAGNOSIS — I10 Essential (primary) hypertension: Secondary | ICD-10-CM | POA: Diagnosis not present

## 2023-01-20 MED ORDER — DILTIAZEM HCL ER COATED BEADS 120 MG PO CP24: 120.0000 mg | ORAL_CAPSULE | Freq: Every day | ORAL | 3 refills | Status: DC

## 2023-01-20 MED ORDER — IRBESARTAN 150 MG PO TABS: 150.0000 mg | ORAL_TABLET | Freq: Every day | ORAL | 3 refills | Status: DC

## 2023-01-20 NOTE — Patient Instructions (Signed)
Medication Instructions:  NO CHANGES  *If you need a refill on your cardiac medications before your next appointment, please call your pharmacy*    Follow-Up: At [facility], you and your health needs are our priority.  As part of our continuing mission to provide you with exceptional heart care, we have created designated Provider Care Teams.  These Care Teams include your primary Cardiologist (physician) and Advanced Practice Providers (APPs -  Physician Assistants and Nurse Practitioners) who all work together to provide you with the care you need, when you need it.  We recommend signing up for the patient portal called "MyChart".  Sign up information is provided on this After Visit Summary.  MyChart is used to connect with patients for Virtual Visits (Telemedicine).  Patients are able to view lab/test results, encounter notes, upcoming appointments, etc.  Non-urgent messages can be sent to your provider as well.   To learn more about what you can do with MyChart, go to [email or web address].    Your next appointment:    12 months with Dr. Rennis Golden   Other Instructions  Please monitor your BPs at home. Please check 1-2 times daily. Please send readings via MyChart in about 2 weeks   HOW TO TAKE YOUR BLOOD PRESSURE: Rest 5 minutes before taking your blood pressure. Don't smoke or drink caffeinated beverages for at least 30 minutes before. Take your blood pressure before (not after) you eat. Sit comfortably with your back supported and both feet on the floor (don't cross your legs). Elevate your arm to heart level on a table or a desk. Use the proper sized cuff. It should fit smoothly and snugly around your bare upper arm. There should be enough room to slip a fingertip under the cuff. The bottom edge of the cuff should be 1 inch above the crease of the elbow. Ideally, take 3 measurements at one sitting and record the average.

## 2023-01-20 NOTE — Progress Notes (Signed)
OFFICE NOTE  Chief Complaint:  Establish cardiologist  Primary Care Physician: Moshe Cipro, NP  HPI:  Stacy [name] is a 82 y.o. female with a past medial history significant for a single episode of paroxysmal atrial fibrillation without recurrence (not anticoagulated), send mild carotid artery stenosis, diagnosis of hypertension and hypothyroidism.  She has been maintained on diltiazem and irbesartan with good blood pressure control.  She has been also noted to have mild aortic insufficiency in the past by echo.  Initial blood pressure today was a little elevated however improved to 130/70 on recheck.  Lipids in October showed total cholesterol 238, HDL 94, triglycerides 64 and LDL 133.  EKG today showed normal sinus rhythm at 94 with left anterior fascicular block.  She resides at friend's home.  Unfortunately she had a recent fall and fractured her patella.  She had to have surgery and will be rehabbing for a while.  PMHx:  Past Medical History:  Diagnosis Date   Atrial fibrillation ([facility])    echo 12/23/10 EF= >55%, stress myoview 01/30/11 normal pattern of perfusion in all myocardial regions   Carotid artery stenosis    Per [facility] new patient packet    Chicken pox    Colon polyp    Colon polyp    Dyslipidemia    Heart murmur    Hiatal hernia    Hypertension    Hypothyroidism    Laryngopharyngeal reflux    Osteopenia    Scoliosis    Seasonal allergies    Thyroid disease    Vitamin D deficiency     Past Surgical History:  Procedure Laterality Date   BREAST CYST EXCISION Right 1988   ESOPHAGEAL MANOMETRY N/A 08/29/2015   Procedure: ESOPHAGEAL MANOMETRY (EM);  Surgeon: Ruffin Frederick, MD;  [facility];  Service: Gastroenterology;  Laterality: N/A;   EYE SURGERY     Cataract eye surgery-right 06/2014, left 04/2014   ORIF PATELLA Right 07/08/2022   Procedure: OPEN REDUCTION INTERNAL FIXATION (ORIF) PATELLA;  Surgeon: Teryl Lucy, MD;  Location: [facility];  Service: Orthopedics;  Laterality: Right;   TONSILLECTOMY     Childhood    FAMHx:  Family History  Problem Relation Age of Onset   CVA Mother    Hyperlipidemia Mother    Hypertension Mother    Anuerysm Father        brain   Stroke Maternal Grandmother    Hypertension Maternal Grandmother    Hypertension Sister    Scoliosis Sister    Colon cancer Neg Hx    Heart attack Neg Hx    Breast cancer Neg Hx     SOCHx:   reports that she has never smoked. She has never used smokeless tobacco. She reports that she does not currently use alcohol after a past usage of about 1.0 standard drink of alcohol per week. She reports that she does not use drugs.  ALLERGIES:  Allergies  Allergen Reactions   Amoxicillin Swelling   Flonase [Fluticasone Propionate] Swelling    Swelling of throat per patient    Sulfa Antibiotics Other (See Comments)    Upset stomach   Sulfasalazine Other (See Comments)    Upset stomach    ROS: Pertinent items noted in HPI and remainder of comprehensive ROS otherwise negative.  HOME MEDS: Current Outpatient Medications on File Prior to Visit  Medication Sig Dispense Refill   aspirin 81 MG tablet Take 81 mg by mouth daily.     Cholecalciferol (VITAMIN  D-3) 1000 UNITS CAPS Take 1,000 Units by mouth 2 (two) times daily.      diltiazem (CARTIA XT) 120 MG 24 hr capsule Take 1 capsule (120 mg total) by mouth at bedtime. 90 capsule 1   irbesartan (AVAPRO) 150 MG tablet Take 1 tablet (150 mg total) by mouth daily. 90 tablet 3   levothyroxine (SYNTHROID) 112 MCG tablet Take 1 tablet (112 mcg total) by mouth daily before breakfast. 90 tablet 0   LORazepam (ATIVAN) 0.5 MG tablet Take 1 tablet (0.5 mg total) by mouth 2 (two) times daily as needed for anxiety. 30 tablet 2   Multiple Vitamin (MULTIVITAMIN) capsule Take 1 capsule by mouth daily.     Multiple Vitamins-Minerals (PRESERVISION AREDS PO) Take 1 tablet by mouth daily.     terbinafine  (LAMISIL) 250 MG tablet Take 250 mg by mouth daily.     No current facility-administered medications on file prior to visit.    LABS/IMAGING: No results found for this or any previous visit (from the past 48 hour(s)). No results found.  LIPID PANEL:    Component Value Date/Time   CHOL 272 (H) 03/12/2020 1424   TRIG 64 03/12/2020 1424   HDL 83 03/12/2020 1424   CHOLHDL 3.3 03/12/2020 1424   VLDL 13.2 12/06/2018 0937   LDLCALC 173 (H) 03/12/2020 1424   LDLDIRECT 148.1 08/17/2013 1143     WEIGHTS: Wt Readings from Last 3 Encounters:  01/20/23 122 lb 12.8 oz (55.7 kg)  07/24/22 131 lb 6.4 oz (59.6 kg)  07/08/22 122 lb (55.3 kg)    VITALS: BP (!) 189/97 (BP Location: Left Arm, Patient Position: Sitting, Cuff Size: Normal)   Pulse 77   Ht 5' 5.5" (1.664 m)   Wt 122 lb 12.8 oz (55.7 kg)   BMI 20.12 kg/m   EXAM: General appearance: alert and no distress Neck: no carotid bruit, no JVD, and thyroid not enlarged, symmetric, no tenderness/mass/nodules Lungs: clear to auscultation bilaterally Heart: regular rate and rhythm, S1, S2 normal, no murmur, click, rub or gallop Abdomen: soft, non-tender; bowel sounds normal; no masses,  no organomegaly Extremities: Right lower extremity is casted Pulses: 2+ and symmetric Skin: Skin color, texture, turgor normal. No rashes or lesions Neurologic: Grossly normal Psych: Pleasant  EKG: Normal sinus rhythm at 94, left anterior fascicular block- personally reviewed  ASSESSMENT: Remote PAF, no recurrence-on aspirin Hypertension Mild aortic insufficiency Hypothyroidism Bilateral carotid artery stenosis  PLAN: 1.   Stacy [name] has not no recurrent atrial fibrillation.  Unfortunately she had a recent fall and is in rehabilitation for a patellar fracture.  Blood pressure appears to be well-controlled.  I would advise she continue both diltiazem and her irbesartan.  She did have some mild aortic insufficiency on echo in 2014 but did not  have a significant murmur on exam.  She had some mild bilateral carotid artery stenosis.  Follow-up was recommended in 1 year however she tells me that she was advised that this was not necessary.  I think we should consider repeat imaging at some point to make sure this is not progressing.  Will plan to follow-up in 6 months or sooner as necessary.  Chrystie Nose, MD, [facility], FACP  [place]  [facility]  Medical Director of the Advanced Lipid Disorders &  Cardiovascular Risk Reduction Clinic Diplomate of the American Board of Clinical Lipidology Attending Cardiologist  Direct Dial: [phone]  Fax: [phone]  Website:  [email or web address] Nicely Delorus Langwell 01/20/2023, 2:54 PM

## 2023-01-28 DIAGNOSIS — Z9181 History of falling: Secondary | ICD-10-CM | POA: Diagnosis not present

## 2023-01-28 DIAGNOSIS — Z6820 Body mass index (BMI) 20.0-20.9, adult: Secondary | ICD-10-CM | POA: Diagnosis not present

## 2023-01-28 DIAGNOSIS — R3915 Urgency of urination: Secondary | ICD-10-CM | POA: Diagnosis not present

## 2023-01-28 DIAGNOSIS — I10 Essential (primary) hypertension: Secondary | ICD-10-CM | POA: Diagnosis not present

## 2023-02-10 DIAGNOSIS — R197 Diarrhea, unspecified: Secondary | ICD-10-CM | POA: Diagnosis not present

## 2023-02-21 ENCOUNTER — Telehealth: Payer: Self-pay | Admitting: Internal Medicine

## 2023-02-21 ENCOUNTER — Encounter (HOSPITAL_COMMUNITY): Payer: Self-pay | Admitting: Emergency Medicine

## 2023-02-21 ENCOUNTER — Emergency Department (HOSPITAL_COMMUNITY): Payer: Medicare HMO

## 2023-02-21 ENCOUNTER — Other Ambulatory Visit: Payer: Self-pay

## 2023-02-21 ENCOUNTER — Emergency Department (HOSPITAL_COMMUNITY)
Admission: EM | Admit: 2023-02-21 | Discharge: 2023-02-21 | Disposition: A | Discharge status: Home / Self Care | Payer: Medicare HMO | Attending: Emergency Medicine | Admitting: Emergency Medicine

## 2023-02-21 DIAGNOSIS — Z7982 Long term (current) use of aspirin: Secondary | ICD-10-CM | POA: Diagnosis not present

## 2023-02-21 DIAGNOSIS — R002 Palpitations: Secondary | ICD-10-CM | POA: Diagnosis not present

## 2023-02-21 DIAGNOSIS — E860 Dehydration: Secondary | ICD-10-CM | POA: Diagnosis not present

## 2023-02-21 DIAGNOSIS — E871 Hypo-osmolality and hyponatremia: Secondary | ICD-10-CM | POA: Insufficient documentation

## 2023-02-21 DIAGNOSIS — K449 Diaphragmatic hernia without obstruction or gangrene: Secondary | ICD-10-CM | POA: Diagnosis not present

## 2023-02-21 DIAGNOSIS — I499 Cardiac arrhythmia, unspecified: Secondary | ICD-10-CM | POA: Diagnosis not present

## 2023-02-21 DIAGNOSIS — Z79899 Other long term (current) drug therapy: Secondary | ICD-10-CM | POA: Diagnosis not present

## 2023-02-21 DIAGNOSIS — E039 Hypothyroidism, unspecified: Secondary | ICD-10-CM | POA: Insufficient documentation

## 2023-02-21 DIAGNOSIS — R42 Dizziness and giddiness: Secondary | ICD-10-CM | POA: Diagnosis not present

## 2023-02-21 DIAGNOSIS — Z743 Need for continuous supervision: Secondary | ICD-10-CM | POA: Diagnosis not present

## 2023-02-21 DIAGNOSIS — I10 Essential (primary) hypertension: Secondary | ICD-10-CM | POA: Insufficient documentation

## 2023-02-21 DIAGNOSIS — I4891 Unspecified atrial fibrillation: Secondary | ICD-10-CM | POA: Insufficient documentation

## 2023-02-21 DIAGNOSIS — R000 Tachycardia, unspecified: Secondary | ICD-10-CM | POA: Diagnosis not present

## 2023-02-21 LAB — CBC
HCT: 41.8 % (ref 36.0–46.0)
Hemoglobin: 14.5 g/dL (ref 12.0–15.0)
MCH: 33.1 pg (ref 26.0–34.0)
MCHC: 34.7 g/dL (ref 30.0–36.0)
MCV: 95.4 fL (ref 80.0–100.0)
Platelets: 170 10*3/uL (ref 150–400)
RBC: 4.38 MIL/uL (ref 3.87–5.11)
RDW: 12.8 % (ref 11.5–15.5)
WBC: 7.2 10*3/uL (ref 4.0–10.5)
nRBC: 0 % (ref 0.0–0.2)

## 2023-02-21 LAB — BASIC METABOLIC PANEL
Anion gap: 10 (ref 5–15)
BUN: 13 mg/dL (ref 8–23)
CO2: 26 mmol/L (ref 22–32)
Calcium: 9.3 mg/dL (ref 8.9–10.3)
Chloride: 93 mmol/L — ABNORMAL LOW (ref 98–111)
Creatinine, Ser: 0.74 mg/dL (ref 0.44–1.00)
GFR, Estimated: >60 mL/min (ref >60)
Glucose, Bld: 104 mg/dL — ABNORMAL HIGH (ref 70–99)
Potassium: 3.5 mmol/L (ref 3.5–5.1)
Sodium: 129 mmol/L — ABNORMAL LOW (ref 135–145)

## 2023-02-21 LAB — PROTIME-INR
INR: 1.2 (ref 0.8–1.2)
Prothrombin Time: 15 seconds (ref 11.4–15.2)

## 2023-02-21 MED ORDER — LACTATED RINGERS IV BOLUS: 1000.0000 mL | Freq: Once | INTRAVENOUS | Status: DC

## 2023-02-21 NOTE — Discharge Instructions (Signed)
You were seen for your palpitations in the emergency department.   At home, please continue taking your diltiazem and aspirin.    Follow-up with your primary doctor in 2-3 days regarding your visit.  Cardiology will be calling you regarding an appointment within the next 72 hours.  You may contact them if you do not hear from them in that time using the information in this packet.  Please talk to them to see if you need to be started on Eliquis.  Return immediately to the emergency department if you experience any of the following: Chest pain, shortness of breath, fainting, or any other concerning symptoms.    Thank you for visiting our Emergency Department. It was a pleasure taking care of you today.

## 2023-02-21 NOTE — ED Triage Notes (Addendum)
BIB EMS from independent living[facility] [address].  Called EMS felt like her heart was racing. Hx of afib.  Nurse on scene stated her BP was 90/50 and she was dizzy.    Afib RVR at 180 was noted by EMS on their arrival.  N/V/D yesterday but not today.  Heart rate was only noticed this morning.  Reports this has not happened in years. No recent med changes.  No CP or SHOB.  Dizziness has resolved.  Two 10 mg doses of cardizem given by EMS.  18G LFA.  BP 122/80.  500 NS bolus given prior to cardizem to stabilize BP per EMS>

## 2023-02-21 NOTE — Telephone Encounter (Signed)
I received a call from the emergency department stating that the patient presented with atrial fibrillation with RVR.  After a few rounds of AV nodal blockers via EMS she had reverted to sinus rhythm.  The emergency department provider stated that she was in stable condition and he felt that she was suitable for discharge to home.  I was called about the decision to start anticoagulation.  She does not appear on her chart to have any clear reasons to not be on a DOAC, so my advice was to start her on apixaban and have her follow-up closely with Dr. Rennis Golden to have a more nuanced discussion about long-term anticoagulation.  Her hemoglobin is 14 and per the emergency department provider she had not had any recent falls.  Gerre Scull MD Cardiology

## 2023-02-21 NOTE — Progress Notes (Signed)
Patient interaction documentation:  EDP consulted pharmacy for DOAC selection and dosing for this patient.  Upon discussing with patient, she expressed concerns about starting anticoagulation at this time. I personally discussed that anticoagulation was due to increased stroke risk and that anticoagulation has been shown to reduce this risk. However, after discussing with EDP as well, patient has decided to call her Cardiologist on Monday to discuss if she should or should not be on anticoagulation.  Based on patient's age (82 yo) and weight (<60kg), she requires dose reduction of eliquis 2.5 mg BID.   Co-pay and free 30 day supply cards for eliquis provided to patient should the conversation Monday result in her taking the medication.  Delmar Landau, PharmD, BCPS 02/21/2023 6:38 PM ED Clinical Pharmacist -  [phone]

## 2023-02-21 NOTE — ED Provider Notes (Signed)
[facility] AT [facility] Provider Note   CSN: [id number] Arrival date & time: 02/21/23  1623     History  Chief Complaint  Patient presents with   Atrial Fibrillation    Stacy [name] is a 82 y.o. female.  82 year old female with history of paroxysmal atrial fibrillation on diltiazem and aspirin, carotid artery stenosis, hypertension, and hypothyroidism who presents emergency department with palpitations.  Says that this morning she was performing her usual routine when she started having an acute onset of palpitations.  Felt very dizzy during this.  Called 911 and when EMS arrived was found to be in A-fib with RVR.  Given 2 doses of her diltiazem and some IV fluids.  Says that she has been taking her diltiazem at home.  Did have nonbloody nonmelanotic diarrhea last night that stopped.  No chest pain, fevers, shortness of breath, or cough.  Not on anticoagulation.  No history of GI bleeds or ICH.       Home Medications Prior to Admission medications   Medication Sig Start Date End Date Taking? Authorizing Provider  aspirin 81 MG tablet Take 81 mg by mouth daily.    [provider]  Cholecalciferol (VITAMIN D-3) 1000 UNITS CAPS Take 1,000 Units by mouth 2 (two) times daily.     [provider]  diltiazem (CARTIA XT) 120 MG 24 hr capsule Take 1 capsule (120 mg total) by mouth at bedtime. 01/20/23   Hilty, Lisette Abu, MD  irbesartan (AVAPRO) 150 MG tablet Take 1 tablet (150 mg total) by mouth daily. 01/20/23   Hilty, Lisette Abu, MD  levothyroxine (SYNTHROID) 112 MCG tablet Take 1 tablet (112 mcg total) by mouth daily before breakfast. 03/08/21   Carlus Pavlov, MD  LORazepam (ATIVAN) 0.5 MG tablet Take 1 tablet (0.5 mg total) by mouth 2 (two) times daily as needed for anxiety. 12/13/20   Mast, Man X, NP  Multiple Vitamin (MULTIVITAMIN) capsule Take 1 capsule by mouth daily.    [provider]  Multiple Vitamins-Minerals  (PRESERVISION AREDS PO) Take 1 tablet by mouth daily.    [provider]  terbinafine (LAMISIL) 250 MG tablet Take 250 mg by mouth daily. 01/13/23   [provider]      Allergies    Amoxicillin, Flonase [fluticasone propionate], Sulfa antibiotics, and Sulfasalazine    Review of Systems   Review of Systems  Physical Exam Updated Vital Signs BP (!) 135/97 (BP Location: Left Arm)   Pulse (!) 56   Temp 97.8 F (36.6 C) (Oral)   Resp 18   SpO2 98%  Physical Exam Vitals and nursing note reviewed.  Constitutional:      General: She is not in acute distress.    Appearance: She is well-developed.  HENT:     Head: Normocephalic and atraumatic.     Right Ear: External ear normal.     Left Ear: External ear normal.     Nose: Nose normal.  Eyes:     Extraocular Movements: Extraocular movements intact.     Conjunctiva/sclera: Conjunctivae normal.     Pupils: Pupils are equal, round, and reactive to light.  Cardiovascular:     Rate and Rhythm: Normal rate and regular rhythm.     Heart sounds: No murmur heard. Pulmonary:     Effort: Pulmonary effort is normal. No respiratory distress.     Breath sounds: Normal breath sounds.  Abdominal:     General: Abdomen is flat. There is no distension.  Palpations: Abdomen is soft. There is no mass.     Tenderness: There is no abdominal tenderness. There is no guarding.  Musculoskeletal:     Cervical back: Normal range of motion and neck supple.     Right lower leg: No edema.     Left lower leg: No edema.  Skin:    General: Skin is warm and dry.  Neurological:     Mental Status: She is alert and oriented to person, place, and time. Mental status is at baseline.  Psychiatric:        Mood and Affect: Mood normal.     ED Results / Procedures / Treatments   Labs (all labs ordered are listed, but only abnormal results are displayed) Labs Reviewed  BASIC METABOLIC PANEL - Abnormal; Notable for the following components:       Result Value   Sodium 129 (*)    Chloride 93 (*)    Glucose, Bld 104 (*)    All other components within normal limits  CBC  PROTIME-INR    EKG EKG Interpretation Date/Time:  Saturday February 21 2023 16:37:35 EDT Ventricular Rate:  150 PR Interval:    QRS Duration:  94 QT Interval:  306 QTC Calculation: 483 R Axis:   -57  Text Interpretation: Atrial fibrillation with rapid ventricular response Left anterior fascicular block Minimal voltage criteria for LVH, may be normal variant ( Cornell product ) Abnormal ECG When compared with ECG of 08-Jul-2022 10:33, PREVIOUS ECG IS PRESENT Confirmed by Vonita Moss [phone]) on 02/21/2023 5:10:45 PM  Radiology DG Chest Port 1 View  Result Date: 02/21/2023 CLINICAL DATA:  Atrial fibrillation EXAM: PORTABLE CHEST 1 VIEW COMPARISON:  Chest x-ray 04/07/2015.  CT of the chest 04/07/2015. FINDINGS: Again seen is a large hiatal hernia. The cardiac silhouette appears within normal limits. There is no definite pleural effusion, focal lung infiltrate or pneumothorax. Note is made that large hiatal hernia obscures evaluation of the left lower lung. There is scoliosis of the thoracic spine, unchanged. No acute fractures are seen. IMPRESSION: 1. Large hiatal hernia. 2. No definite evidence of acute cardiopulmonary process. 3. Scoliosis of the thoracic spine. Electronically Signed   By: Darliss Cheney M.D.   On: 02/21/2023 18:11    Procedures Procedures    Medications Ordered in ED Medications  lactated ringers bolus 1,000 mL (1,000 mLs Intravenous Bolus 02/21/23 1730)    ED Course/ Medical Decision Making/ A&P Clinical Course as of 02/21/23 1818  Sat Feb 21, 2023  1745 Dr Hyacinth Meeker cardiology consulted.  Recommends starting the patient on DOAC at this time. [RP]  1815 Per pharmacy 2.5 mg BID of eliquis [RP]    Clinical Course User Index [RP] Rondel Baton, MD          CHA2DS2-VASc Score: 5                    Medical Decision Making Amount  and/or Complexity of Data Reviewed Labs: ordered.   Stacy [name] is a 82 y.o. female with comorbidities that complicate the patient evaluation including paroxysmal atrial fibrillation on diltiazem and aspirin, carotid artery stenosis, hypertension, and hypothyroidism who presented to the emergency department with palpitations found to be in atrial fibrillation with RVR  Initial Ddx:  A-fib with RVR, SVT, sinus tachycardia, dehydration, electrolyte abnormality  MDM/Course:  Patient arrived to the emergency department with palpitations and was found to be in atrial fibrillation with RVR.  By the time of my evaluation the patient  was back in a sinus rhythm.  This is after receiving diltiazem and fluids from EMS.  Does appear that she had been having some diarrhea last night which spontaneously resolved.  Given additional fluids in the emergency department.  This may have been the precipitating factor.  Upon re-evaluation patient remained stable.  She does have a CHA2DS2-VASc score of 5 and I discussed this with cardiology who felt that she should be started on Eliquis.  Pharmacy states that based on her labs today and weight should be started at 2.5 mg of Eliquis twice daily.  When discussing this with the patient she had some reservations.  I did explain to her the increased risk of stroke that she has due to her CHA2DS2-VASc score.  She stated that she understood but still did not want to start the Eliquis at this time.  Will have her continue her aspirin and follow-up with Dr. Rennis Golden as an outpatient.  Labs did show that she had hyponatremia which I suspect may be partially chronic or due to her diarrhea since she is not far from her baseline sodium.  Will have her follow-up with her primary doctor in several days regarding this as well.  This patient presents to the ED for concern of complaints listed in HPI, this involves an extensive number of treatment options, and is a complaint that carries with  it a high risk of complications and morbidity. Disposition including potential need for admission considered.   Dispo: DC Home. Return precautions discussed including, but not limited to, those listed in the AVS. Allowed pt time to ask questions which were answered fully prior to dc.  Additional history obtained from EMS Records reviewed Outpatient Clinic Notes The following labs were independently interpreted: Chemistry and show  hyponatremia I independently reviewed the following imaging with scope of interpretation limited to determining acute life threatening conditions related to emergency care: Chest x-ray and agree with the radiologist interpretation with the following exceptions: none I personally reviewed and interpreted cardiac monitoring: normal sinus rhythm  I personally reviewed and interpreted the pt's EKG: see above for interpretation  I have reviewed the patients home medications and made adjustments as needed Consults: Cardiology Social Determinants of health:  Elderly       Final Clinical Impression(s) / ED Diagnoses Final diagnoses:  Atrial fibrillation with RVR (HCC)  Palpitations  Dehydration  Hyponatremia    Rx / DC Orders ED Discharge Orders          Ordered    Amb referral to AFIB Clinic        02/21/23 1746    Ambulatory referral to Cardiology        02/21/23 1817              Rondel Baton, MD 02/21/23 1821

## 2023-02-23 ENCOUNTER — Telehealth: Payer: Self-pay | Admitting: Internal Medicine

## 2023-02-23 NOTE — Telephone Encounter (Signed)
Pt c/o medication issue:  1. Name of Medication: Eliquis  2. How are you currently taking this medication (dosage and times per day)?   3. Are you having a reaction (difficulty breathing--STAT)? No  4. What is your medication issue? Patient is calling because while she was at the hospital on 07/20, the cardiologist on call recommended for her to start taking Eliquis. Patient is requesting to know if Dr. Rennis Golden agrees with her starting this medication. Please advise.

## 2023-02-23 NOTE — Telephone Encounter (Signed)
Patient is scheduled for f/u with [facility]. She would like to know if you are in agreement with her taking eliquis.

## 2023-02-25 DIAGNOSIS — E039 Hypothyroidism, unspecified: Secondary | ICD-10-CM | POA: Diagnosis not present

## 2023-02-25 DIAGNOSIS — E782 Mixed hyperlipidemia: Secondary | ICD-10-CM | POA: Diagnosis not present

## 2023-02-25 DIAGNOSIS — Z6820 Body mass index (BMI) 20.0-20.9, adult: Secondary | ICD-10-CM | POA: Diagnosis not present

## 2023-02-25 DIAGNOSIS — E860 Dehydration: Secondary | ICD-10-CM | POA: Diagnosis not present

## 2023-02-25 DIAGNOSIS — R3915 Urgency of urination: Secondary | ICD-10-CM | POA: Diagnosis not present

## 2023-02-25 DIAGNOSIS — K5909 Other constipation: Secondary | ICD-10-CM | POA: Diagnosis not present

## 2023-02-25 DIAGNOSIS — F411 Generalized anxiety disorder: Secondary | ICD-10-CM | POA: Diagnosis not present

## 2023-02-25 DIAGNOSIS — I10 Essential (primary) hypertension: Secondary | ICD-10-CM | POA: Diagnosis not present

## 2023-02-25 DIAGNOSIS — I4891 Unspecified atrial fibrillation: Secondary | ICD-10-CM | POA: Diagnosis not present

## 2023-02-25 DIAGNOSIS — E871 Hypo-osmolality and hyponatremia: Secondary | ICD-10-CM | POA: Diagnosis not present

## 2023-03-02 DIAGNOSIS — E871 Hypo-osmolality and hyponatremia: Secondary | ICD-10-CM | POA: Diagnosis not present

## 2023-03-03 DIAGNOSIS — H353111 Nonexudative age-related macular degeneration, right eye, early dry stage: Secondary | ICD-10-CM | POA: Diagnosis not present

## 2023-03-03 DIAGNOSIS — H353121 Nonexudative age-related macular degeneration, left eye, early dry stage: Secondary | ICD-10-CM | POA: Diagnosis not present

## 2023-03-03 DIAGNOSIS — H3322 Serous retinal detachment, left eye: Secondary | ICD-10-CM | POA: Diagnosis not present

## 2023-03-05 NOTE — Progress Notes (Signed)
Cardiology Clinic Note   Patient Name: Stacy [name] Date of Encounter: 03/06/2023  Primary Care Provider:  Moshe Cipro, NP Primary Cardiologist:  Stacy Nose, MD  Patient Profile    82 year old female with history of atrial fibrillation, CHA2DS2-VASc score of 5, carotid artery stenosis, dyslipidemia, hypertension, hypothyroidism, osteopenia, and vitamin D deficiency.  She was seen last by Dr. Rennis [name] on 01/20/2023 and was stable from a cardiac standpoint.  She was to be seen annually.  Unfortunately she was seen in the ED on 02/21/2023 in the setting of "acute palpitations" with associated dizziness.  EMS documented A-fib with RVR, she was given 2 doses of diltiazem and IV fluids.  Prior to the episode she was having diarrhea, which was nonmelanotic.  She was found to be hyponatremic with a sodium of 129.  During ED visit she was started on Eliquis 2.5 mg twice daily however on discharge the patient refused anticoagulation despite education concerning stroke risk.  She was started on aspirin and was to follow-up with cardiology.   Past Medical History    Past Medical History:  Diagnosis Date   Atrial fibrillation (HCC)    echo 12/23/10 EF= >55%, stress myoview 01/30/11 normal pattern of perfusion in all myocardial regions   Carotid artery stenosis    Per [facility] new patient packet    Chicken pox    Colon polyp    Colon polyp    Dyslipidemia    Heart murmur    Hiatal hernia    Hypertension    Hypothyroidism    Laryngopharyngeal reflux    Osteopenia    Scoliosis    Seasonal allergies    Thyroid disease    Vitamin D deficiency    Past Surgical History:  Procedure Laterality Date   BREAST CYST EXCISION Right 1988   ESOPHAGEAL MANOMETRY N/A 08/29/2015   Procedure: ESOPHAGEAL MANOMETRY (EM);  Surgeon: Stacy Frederick, MD;  Location: [facility];  Service: Gastroenterology;  Laterality: N/A;   EYE SURGERY     Cataract eye surgery-right 06/2014, left 04/2014   ORIF  PATELLA Right 07/08/2022   Procedure: OPEN REDUCTION INTERNAL FIXATION (ORIF) PATELLA;  Surgeon: Stacy Lucy, MD;  Location: [facility];  Service: Orthopedics;  Laterality: Right;   TONSILLECTOMY     Childhood    Allergies  Allergies  Allergen Reactions   Amoxicillin Swelling   Flonase [Fluticasone Propionate] Swelling    Swelling of throat per patient    Sulfa Antibiotics Other (See Comments)    Upset stomach   Sulfasalazine Other (See Comments)    Upset stomach    History of Present Illness    [name] returns to the office today after ED visit where she was found to be in A-fib RVR which was symptomatic with dizziness and lightheadedness.  Advised in ED to begin Eliquis 2.5 mg twice daily but she refused despite education concerning stroke risk with a CHA2DS2-VASc score of 5.  She was willing to continue daily aspirin.  Since being seen in the ED, she has followed up with her primary care provider who has convinced her to take Eliquis.  She is now on Eliquis 2.5 mg twice daily.  She was also diagnosed with a UTI and has been on antibiotics for this, this is causing her to have some diarrhea and stomach upset.  She had been placed on probiotics.  She also feels as if she is urinating a lot more than normal.  She is concerned about dehydration.  She denies  any rapid heart rhythm, fast irregular beats, palpitations, dizziness, shortness of breath or chest pain.  Melodic diarrhea, hemoptysis, or hematuria.  Home Medications    Current Outpatient Medications  Medication Sig Dispense Refill   aspirin 81 MG tablet Take 81 mg by mouth daily.     Cholecalciferol (VITAMIN D-3) 1000 UNITS CAPS Take 1,000 Units by mouth 2 (two) times daily.      diltiazem (CARTIA XT) 120 MG 24 hr capsule Take 1 capsule (120 mg total) by mouth at bedtime. 90 capsule 3   ELIQUIS 2.5 MG TABS tablet Take 2.5 mg by mouth 2 (two) times daily.     irbesartan (AVAPRO) 150 MG tablet Take 1  tablet (150 mg total) by mouth daily. 90 tablet 3   levothyroxine (SYNTHROID) 112 MCG tablet Take 1 tablet (112 mcg total) by mouth daily before breakfast. 90 tablet 0   LORazepam (ATIVAN) 0.5 MG tablet Take 1 tablet (0.5 mg total) by mouth 2 (two) times daily as needed for anxiety. 30 tablet 2   Multiple Vitamin (MULTIVITAMIN) capsule Take 1 capsule by mouth daily.     Multiple Vitamins-Minerals (PRESERVISION AREDS PO) Take 1 tablet by mouth daily.     nitrofurantoin, macrocrystal-monohydrate, (MACROBID) 100 MG capsule Take 100 mg by mouth every 12 (twelve) hours.     terbinafine (LAMISIL) 250 MG tablet Take 250 mg by mouth daily.     No current facility-administered medications for this visit.     Family History    Family History  Problem Relation Age of Onset   CVA Mother    Hyperlipidemia Mother    Hypertension Mother    Anuerysm Father        brain   Stroke Maternal Grandmother    Hypertension Maternal Grandmother    Hypertension Sister    Scoliosis Sister    Colon cancer Neg Hx    Heart attack Neg Hx    Breast cancer Neg Hx    She indicated that her mother is deceased. She indicated that her father is deceased. She indicated that her sister is alive. She indicated that her maternal grandmother is deceased. She indicated that the status of her neg hx is unknown.  Social History    Social History   Socioeconomic History   Marital status: Single    Spouse name: Not on file   Number of children: Not on file   Years of education: Not on file   Highest education level: Not on file  Occupational History   Occupation: retired  Tobacco Use   Smoking status: Never   Smokeless tobacco: Never  Substance and Sexual Activity   Alcohol use: Not Currently    Alcohol/week: 1.0 standard drink of alcohol    Types: 1 Glasses of wine per week    Comment: 1 glass of wine 3x a week   Drug use: No   Sexual activity: Not on file  Other Topics Concern   Not on file  Social History  Narrative   Work or School: Clinical research associate - poetry      Home Situation: lives alone      Spiritual Beliefs: Jewish      Lifestyle: [facility] [employer] [facility]; diet healthy      Per [facility] New Patient Packet:      Diet: Heart healthy, low-fat      Caffeine: 1 cup of tea daily       Married, if yes what year: Single  Do you live in a house, apartment, assisted living, condo, trailer, ect: Apartment, 1 person, 7 stories       Pets: No      Current/Past profession: PHD, Child psychotherapist      Exercise: Yes, Treadmill, weights, and walking          Living Will: Yes   DNR: Yes   POA/HPOA: Yes      Functional Status:   Do you have difficulty bathing or dressing yourself? No   Do you have difficulty preparing food or eating? No   Do you have difficulty managing your medications? No   Do you have difficulty managing your finances? No   Do you have difficulty affording your medications? No         Social Determinants of Corporate investment banker Strain: Not on file  Food Insecurity: Not on file  Transportation Needs: Not on file  Physical Activity: Not on file  Stress: Not on file  Social Connections: Not on file  Intimate Partner Violence: Not on file     Review of Systems    General:  No chills, fever, night sweats or weight changes.  Cardiovascular:  No chest pain, dyspnea on exertion, edema, orthopnea, palpitations, paroxysmal nocturnal dyspnea. Dermatological: No rash, lesions/masses Respiratory: No cough, dyspnea Urologic: No hematuria, dysuria Abdominal:   No nausea, vomiting, positive for diarrhea, bright red blood per rectum, melena, or hematemesis Neurologic:  No visual changes, wkns, changes in mental status. All other systems reviewed and are otherwise negative except as noted above.       Physical Exam    VS:  BP 132/88 (BP Location: Left Arm, Patient Position: Sitting, Cuff Size: Normal)   Pulse 79   Ht 5\' 5"  (1.651 m)   Wt 120 lb 12.8 oz (54.8 kg)    SpO2 93%   BMI 20.10 kg/m  , BMI Body mass index is 20.1 kg/m.     GEN: Well nourished, well developed, in no acute distress. HEENT: normal. Neck: Supple, no JVD, carotid bruits, or masses. Cardiac: RRR, no murmurs, rubs, or gallops. No clubbing, cyanosis, edema.  Radials/DP/PT 2+ and equal bilaterally.  Respiratory:  Respirations regular and unlabored, clear to auscultation bilaterally. GI: Soft, nontender, nondistended, BS + x 4. MS: no deformity or atrophy. Skin: warm and dry, no rash. Neuro:  Strength and sensation are intact. Psych: Normal affect.      Lab Results  Component Value Date   WBC 7.2 02/21/2023   HGB 14.5 02/21/2023   HCT 41.8 02/21/2023   MCV 95.4 02/21/2023   PLT 170 02/21/2023   Lab Results  Component Value Date   CREATININE 0.74 02/21/2023   BUN 13 02/21/2023   NA 129 (L) 02/21/2023   K 3.5 02/21/2023   CL 93 (L) 02/21/2023   CO2 26 02/21/2023   Lab Results  Component Value Date   ALT 17 03/12/2020   AST 20 03/12/2020   ALKPHOS 69 12/19/2010   BILITOT 0.8 03/12/2020   Lab Results  Component Value Date   CHOL 272 (H) 03/12/2020   HDL 83 03/12/2020   LDLCALC 173 (H) 03/12/2020   LDLDIRECT 148.1 08/17/2013   TRIG 64 03/12/2020   CHOLHDL 3.3 03/12/2020    No results found for: "HGBA1C"   Review of Prior Studies  Echocardiogram 05/11/2013 Left ventricle: The cavity size was normal. There was mild    focal basal hypertrophy of the septum. Systolic function    was vigorous. The estimated  ejection fraction was in the    range of 65% to 70%. Wall motion was normal; there were no    regional wall motion abnormalities. Doppler parameters are    consistent with abnormal left ventricular relaxation    (grade 1 diastolic dysfunction). Doppler parameters are    consistent with high ventricular filling pressure.  - Aortic valve: Mild regurgitation. Mean gradient: 9mm Hg    (S). Peak gradient: 16mm Hg (S).  - Mitral valve: Calcified annulus.  -  Pulmonary arteries: Systolic pressure was mildly    increased. PA peak pressure: 33mm Hg (S).  Transthoracic echocardiography.  M-mode,     Carotid Artery Ultrasound 05/23/2019  Summary:  Right Carotid: Velocities in the right ICA are consistent with a 1-39%  stenosis.                Unable to duplicate velocities from prior exam. Patient's  mid ICA                 is tortuous which would account for a higher velocity  sampled                previously.   Left Carotid: Velocities in the left ICA are consistent with a 1-39%  stenosis.   Vertebrals: Bilateral vertebral arteries demonstrate antegrade flow.  Subclavians: Normal flow hemodynamics were seen in bilateral subclavian               arteries.    Assessment & Plan   1.  Paroxysmal atrial fibrillation: Noted in the ED when she was admitted for dehydration, and UTI.  She was recommended for Eliquis at that time but refused until following up with PCP.  She has now been convinced to take Eliquis which she is started over the last week at 2.5 mg twice daily.  She denies any bleeding issues on this medication.  Heart rate has been regular, she has not felt any racing or palpitations.  Will continue on diltiazem 120 mg daily.  2.  Hypertension: She remains on irbesartan 150 mg daily.  Her primary care provider requested that we change this medication due to side effects of constipation.  The patient states that she had not been really having constipation as many years that she has been on this medication.  She has just been noticing some constipation over the last few years since moving into a [facility] where she is not able to control her diet choices very much due to a limited menu.  She has been taking probiotics which have been helpful.  Would not recommend adding amlodipine and taking her off of irbesartan as she is also on a dihydropyridine calcium channel blocker.  We would have to take her off of that and place her on  metoprolol which I do not think she would tolerate well.  For now would like to leave her on current medication regimen.  3.  Frequent diarrhea: Has recently started antibiotics for UTI.  She also states that she is increased her diuresis.  She is not eating very much because she feels as if she is eliminating too often.  She states that her PCP is concerned about her sodium, with evidence of hyponatremia.  I have advised her to avoid dehydration and to either try some Pedialyte or possibly Gatorade if she is having frequent diarrhea or urination to the point where she is feeling dehydrated or lightheaded.  She will follow-up with PCP for ongoing management and labs  concerning hyponatremia.         Signed, Bettey Mare. Liborio Nixon, ANP, AACC   03/06/2023 3:54 PM      Office [phone] Fax [phone]  Notice: This dictation was prepared with Dragon dictation along with smaller phrase technology. Any transcriptional errors that result from this process are unintentional and may not be corrected upon review.  I

## 2023-03-06 ENCOUNTER — Ambulatory Visit: Payer: Medicare HMO | Attending: Adult Health | Admitting: Adult Health

## 2023-03-06 ENCOUNTER — Encounter: Payer: Self-pay | Admitting: Adult Health

## 2023-03-06 VITALS — BP 132/88 | HR 79 | Ht 65.0 in | Wt 120.8 lb

## 2023-03-06 DIAGNOSIS — I480 Paroxysmal atrial fibrillation: Secondary | ICD-10-CM | POA: Diagnosis not present

## 2023-03-06 DIAGNOSIS — I10 Essential (primary) hypertension: Secondary | ICD-10-CM | POA: Diagnosis not present

## 2023-03-06 DIAGNOSIS — I6523 Occlusion and stenosis of bilateral carotid arteries: Secondary | ICD-10-CM | POA: Diagnosis not present

## 2023-03-06 NOTE — Patient Instructions (Addendum)
Medication Instructions:  No Changes *If you need a refill on your cardiac medications before your next appointment, please call your pharmacy*   Lab Work: No Labs If you have labs (blood work) drawn today and your tests are completely normal, you will receive your results only by: MyChart Message (if you have MyChart) OR A paper copy in the mail If you have any lab test that is abnormal or we need to change your treatment, we will call you to review the results.   Testing/Procedures: No Testing   Follow-Up: At [facility], you and your health needs are our priority.  As part of our continuing mission to provide you with exceptional heart care, we have created designated Provider Care Teams.  These Care Teams include your primary Cardiologist (physician) and Advanced Practice Providers (APPs -  Physician Assistants and Nurse Practitioners) who all work together to provide you with the care you need, when you need it.  We recommend signing up for the patient portal called "MyChart".  Sign up information is provided on this After Visit Summary.  MyChart is used to connect with patients for Virtual Visits (Telemedicine).  Patients are able to view lab/test results, encounter notes, upcoming appointments, etc.  Non-urgent messages can be sent to your provider as well.   To learn more about what you can do with MyChart, go to [email or web address].    Your next appointment:   1 month(s)  Provider:   Chrystie Nose, MD or Joni Reining, DNP,ANP  Recommended drinking Pedialyte.

## 2023-03-09 ENCOUNTER — Telehealth: Payer: Self-pay | Admitting: Adult Health

## 2023-03-09 DIAGNOSIS — E871 Hypo-osmolality and hyponatremia: Secondary | ICD-10-CM | POA: Diagnosis not present

## 2023-03-09 NOTE — Telephone Encounter (Signed)
Dr. Corliss Blacker called in asking to speak with DOD. It states it is about an urgent medication change

## 2023-03-10 NOTE — Telephone Encounter (Signed)
Dr.Croitoru spoke to Dr.McNeill 8/5.

## 2023-03-16 DIAGNOSIS — E039 Hypothyroidism, unspecified: Secondary | ICD-10-CM | POA: Diagnosis not present

## 2023-03-16 DIAGNOSIS — E871 Hypo-osmolality and hyponatremia: Secondary | ICD-10-CM | POA: Diagnosis not present

## 2023-03-23 DIAGNOSIS — E871 Hypo-osmolality and hyponatremia: Secondary | ICD-10-CM | POA: Diagnosis not present

## 2023-03-25 ENCOUNTER — Other Ambulatory Visit: Payer: Self-pay

## 2023-03-25 MED ORDER — DILTIAZEM HCL ER COATED BEADS 180 MG PO CP24: 180.0000 mg | ORAL_CAPSULE | Freq: Every day | ORAL | 3 refills | Status: DC

## 2023-03-25 NOTE — Progress Notes (Signed)
Per Dr. Smitty Pluck to refil1 the Diltiazem 180mg . Refill sent to [facility]

## 2023-03-27 ENCOUNTER — Ambulatory Visit: Payer: Medicare HMO | Admitting: Internal Medicine

## 2023-03-27 ENCOUNTER — Encounter: Payer: Self-pay | Admitting: Internal Medicine

## 2023-03-27 VITALS — BP 150/70 | HR 86 | Ht 65.0 in | Wt 120.6 lb

## 2023-03-27 DIAGNOSIS — E039 Hypothyroidism, unspecified: Secondary | ICD-10-CM

## 2023-03-27 DIAGNOSIS — E871 Hypo-osmolality and hyponatremia: Secondary | ICD-10-CM | POA: Diagnosis not present

## 2023-03-27 NOTE — Patient Instructions (Addendum)
Please stop at the lab.  Continue levothyroxine 112 mcg daily + 1 extra tablet a week.  Take the thyroid hormone every day, with water, at least 30 minutes before breakfast, separated by at least 4 hours from: - acid reflux medications - calcium - iron - multivitamins  Please return in 6 months.

## 2023-03-27 NOTE — Progress Notes (Unsigned)
Patient ID: Stacy [name], female   DOB: 1941-05-01, 82 y.o.   MRN: [id number]  HPI  Stacy [name] is a 82 y.o.-year-old female, previously seen for hypothyroidism but lost for follow-up after our visit from 03/20/2020, now returning as a new referral from PCP for hyponatremia.  Pt had mildly decreased sodium levels with the last being lower, approximately 3 weeks ago. She was asked to do fluid restriction, but this was not effective.  She developed constipation at the end of 02/2023, then explosive diarrhea, then dehydration with dizziness. She also developed A.fib, which corrected with hydration. However, she was started on Eliquis. She was asked to do 32 ounces fluid restriction (4 glasses) and was taken off irbesartan (she had started this in 2020-2021), due to possible risk of hyponatremia. Her cardizem dose was increased.  She was referred to endocrinology for further investigation.  I reviewed pt's recent sodium levels: 03/20/2023: Sodium 126 reportedly 03/02/2023: Sodium 125 per review of PCPs notes.   Lab Results  Component Value Date   NA 129 (L) 02/21/2023   NA 132 (L) 09/30/2020   NA 135 03/12/2020   NA 136 04/06/2019   NA 137 03/17/2019   NA 139 12/08/2017   NA 140 08/25/2016   NA 138 08/22/2015   NA 140 08/18/2014   NA 139 08/17/2013   NA 142 12/19/2010   NA 142 12/18/2010  05/22/2022: Sodium 134 (136-145)  Per review of the chart, her hyponatremia is associated with hypochloremia but not alkalosis (see below) but not alkalosis.   I do not have a recent urine specific gravity available for review, but it was 1.006 in 2022.  Pt does not add much salt to her food and she drinks >1L fluids a day.  No swelling in legs or arms. No heart,  liver dysfunction.  Lab Results  Component Value Date   ALT 17 03/12/2020   AST 20 03/12/2020   ALKPHOS 69 12/19/2010   BILITOT 0.8 03/12/2020   No HTG: Lab Results  Component Value Date   CHOL 272 (H) 03/12/2020   HDL 83  03/12/2020   LDLCALC 173 (H) 03/12/2020   LDLDIRECT 148.1 08/17/2013   TRIG 64 03/12/2020   CHOLHDL 3.3 03/12/2020    Pt does not smoke.   Pt also has no h/o OP.  However, she had a closed nondisplaced comminuted fracture of the right patella on 07/07/2022 and had to have ORIF.  Of note, in the past she declined treatment for osteoporosis.  No Br CA history.  No dizziness, no orthostasis, no HAs, no blurry vision.  She also has a history of hypothyroidism:  Pt is on levothyroxine 112 mcg daily, + 1 extra tablet a week (increased recently, 1 week ago) - in am - fasting - at least 30 min from b'fast - no calcium - no iron - + multivitamins at lunchtime - no PPIs - not on Biotin  Lab Results  Component Value Date   TSH 5.31 (H) 09/12/2020   TSH 2.93 04/24/2020   TSH 8.87 (H) 03/20/2020   TSH 9.36 (H) 03/12/2020   TSH 3.39 05/04/2019   TSH 1.63 03/17/2019   TSH 0.51 12/06/2018   TSH 1.22 08/10/2017   TSH 0.37 08/25/2016   TSH 1.14 02/20/2016   TSH 0.64 08/22/2015   TSH 0.88 08/18/2014   TSH 1.18 08/17/2013   TSH 0.970 12/18/2010    Pt. also has a history of A-fib with RVR, on Eliquis.  [facility]  ROS: Constitutional: no weight gain/loss, no fatigue, no subjective hyperthermia/hypothermia Eyes: no blurry vision, no xerophthalmia ENT: no sore throat, no nodules palpated in throat, no dysphagia/odynophagia, no hoarseness Cardiovascular: no CP/SOB/palpitations/leg swelling Respiratory: no cough/SOB Gastrointestinal: no N/V/D/C Musculoskeletal: no muscle/joint aches Skin: no rashes Neurological: no tremors/numbness/tingling/dizziness Psychiatric: no depression/anxiety  Past Medical History:  Diagnosis Date   Atrial fibrillation (HCC)    echo 12/23/10 EF= >55%, stress myoview 01/30/11 normal pattern of perfusion in all myocardial regions   Carotid artery stenosis    Per [facility] new patient packet    Chicken pox    Colon polyp    Colon polyp     Dyslipidemia    Heart murmur    Hiatal hernia    Hypertension    Hypothyroidism    Laryngopharyngeal reflux    Osteopenia    Scoliosis    Seasonal allergies    Thyroid disease    Vitamin D deficiency    Past Surgical History:  Procedure Laterality Date   BREAST CYST EXCISION Right 1988   ESOPHAGEAL MANOMETRY N/A 08/29/2015   Procedure: ESOPHAGEAL MANOMETRY (EM);  Surgeon: Ruffin Frederick, MD;  Location: [facility];  Service: Gastroenterology;  Laterality: N/A;   EYE SURGERY     Cataract eye surgery-right 06/2014, left 04/2014   ORIF PATELLA Right 07/08/2022   Procedure: OPEN REDUCTION INTERNAL FIXATION (ORIF) PATELLA;  Surgeon: Teryl Lucy, MD;  Location: [facility];  Service: Orthopedics;  Laterality: Right;   TONSILLECTOMY     Childhood   Social History   Socioeconomic History   Marital status: Single    Spouse name: Not on file   Number of children: Not on file   Years of education: Not on file   Highest education level: Not on file  Occupational History   Occupation: retired  Tobacco Use   Smoking status: Never   Smokeless tobacco: Never  Substance and Sexual Activity   Alcohol use: Not Currently    Alcohol/week: 1.0 standard drink of alcohol    Types: 1 Glasses of wine per week    Comment: 1 glass of wine 3x a week   Drug use: No   Sexual activity: Not on file  Other Topics Concern   Not on file  Social History Narrative   Work or School: Clinical research associate - poetry      Home Situation: lives alone      Spiritual Beliefs: Jewish      Lifestyle: [facility] [employer] [facility]; diet healthy      Per [facility] New Patient Packet:      Diet: Heart healthy, low-fat      Caffeine: 1 cup of tea daily       Married, if yes what year: Single      Do you live in a house, apartment, assisted living, condo, trailer, ect: Apartment, 1 person, 7 stories       Pets: No      Current/Past profession: PHD, Child psychotherapist      Exercise: Yes, Treadmill,  weights, and walking          Living Will: Yes   DNR: Yes   POA/HPOA: Yes      Functional Status:   Do you have difficulty bathing or dressing yourself? No   Do you have difficulty preparing food or eating? No   Do you have difficulty managing your medications? No   Do you have difficulty managing your finances? No   Do you have  difficulty affording your medications? No         Social Determinants of Corporate investment banker Strain: Not on file  Food Insecurity: Not on file  Transportation Needs: Not on file  Physical Activity: Not on file  Stress: Not on file  Social Connections: Not on file  Intimate Partner Violence: Not on file   Current Outpatient Medications on File Prior to Visit  Medication Sig Dispense Refill   aspirin 81 MG tablet Take 81 mg by mouth daily.     Cholecalciferol (VITAMIN D-3) 1000 UNITS CAPS Take 1,000 Units by mouth 2 (two) times daily.      diltiazem (CARTIA XT) 180 MG 24 hr capsule Take 1 capsule (180 mg total) by mouth at bedtime. 90 capsule 3   ELIQUIS 2.5 MG TABS tablet Take 2.5 mg by mouth 2 (two) times daily.     irbesartan (AVAPRO) 150 MG tablet Take 1 tablet (150 mg total) by mouth daily. 90 tablet 3   levothyroxine (SYNTHROID) 112 MCG tablet Take 1 tablet (112 mcg total) by mouth daily before breakfast. 90 tablet 0   LORazepam (ATIVAN) 0.5 MG tablet Take 1 tablet (0.5 mg total) by mouth 2 (two) times daily as needed for anxiety. 30 tablet 2   Multiple Vitamin (MULTIVITAMIN) capsule Take 1 capsule by mouth daily.     Multiple Vitamins-Minerals (PRESERVISION AREDS PO) Take 1 tablet by mouth daily.     nitrofurantoin, macrocrystal-monohydrate, (MACROBID) 100 MG capsule Take 100 mg by mouth every 12 (twelve) hours.     terbinafine (LAMISIL) 250 MG tablet Take 250 mg by mouth daily.     No current facility-administered medications on file prior to visit.   Allergies  Allergen Reactions   Amoxicillin Swelling   Flonase [Fluticasone  Propionate] Swelling    Swelling of throat per patient    Sulfa Antibiotics Other (See Comments)    Upset stomach   Sulfasalazine Other (See Comments)    Upset stomach   Family History  Problem Relation Age of Onset   CVA Mother    Hyperlipidemia Mother    Hypertension Mother    Anuerysm Father        brain   Stroke Maternal Grandmother    Hypertension Maternal Grandmother    Hypertension Sister    Scoliosis Sister    Colon cancer Neg Hx    Heart attack Neg Hx    Breast cancer Neg Hx     PE: There were no vitals taken for this visit. Wt Readings from Last 3 Encounters:  03/06/23 120 lb 12.8 oz (54.8 kg)  01/20/23 122 lb 12.8 oz (55.7 kg)  07/24/22 131 lb 6.4 oz (59.6 kg)   Constitutional: normal weight, in NAD Eyes:  EOMI, no exophthalmos ENT: no neck masses, no cervical lymphadenopathy Cardiovascular: RRR, No MRG Respiratory: CTA B Musculoskeletal: no deformities Skin:no rashes Neurological: no tremor with outstretched hands  ASSESSMENT: 1. Hyponatremia Patient with a history of approximately 2 years of hyponatremia, while on irbesartan.  Low sodium was 125 obtained at the end of last month - No indication of pseudohyponatremia (no increased triglycerides or proteins), dilutional hyponatremia (hypervolemia, hyperglycemia).  At today's visit we will check a plasma osmolality. -We will also check her urinary sodium in the urine specific gravity.  We discussed that if this is lower than 30 (also in the setting of hypochloremia), it is possible that her hyponatremia may be due to fluid loss, although, she does not not take a diuretic,  and she does not have diarrhea, vomiting).  Also, she does not appear to have acute water overload.  If her urinary sodium is higher than 30, her hyponatremia can be related to hypothyroidism, renal disease (her GFR is only mildly decreased), medications (irbesartan?),  Adrenal insufficiency, or reset osmostat.  SIADH is a diagnosis of exclusion  (patients usually have a decreased plasma osmolality, and increased urine sodium with normal salt and water intake and an inappropriately increased urine osmolality or urine specific gravity in the setting of clinical euvolemia.   -Since she has hypochloremia and denies diarrhea or vomiting, and drinks water but not excessively, the concern for now is of irbesartan induced hyponatremia, but she is now eliminated this -At today's visit I would like to repeat the sodium level along with a urinary specific gravity, and the plasma and urine osmolality.  Will also check her thyroid function tests, and also need to test her for adrenal insufficiency. I have a very low suspicion for the latter. -I advised her to stay off irbesartan at least for now  2.  Hypothyroidism Patient with long-standing hypothyroidism, on levothyroxine therapy. - latest thyroid labs reviewed with pt. >> TSH was elevated Lab Results  Component Value Date   TSH 5.31 (H) 09/12/2020  - she continues on LT4 *** mcg daily - pt feels good on this dose. - we discussed about taking the thyroid hormone every day, with water, >30 minutes before breakfast, separated by >4 hours from acid reflux medications, calcium, iron, multivitamins. Pt. is taking it correctly. - will check thyroid tests today: TSH and fT4 - If labs are abnormal, she will need to return for repeat TFTs in 1.5 months - Otherwise, I will see her back in 6 months  Carlus Pavlov, MD PhD [facility]

## 2023-03-29 LAB — OSMOLALITY: Osmolality: 268 mosm/kg — ABNORMAL LOW (ref 278–305)

## 2023-03-30 ENCOUNTER — Other Ambulatory Visit: Payer: Medicare HMO

## 2023-03-30 DIAGNOSIS — E871 Hypo-osmolality and hyponatremia: Secondary | ICD-10-CM | POA: Diagnosis not present

## 2023-03-30 LAB — URINALYSIS, ROUTINE W REFLEX MICROSCOPIC
Bilirubin Urine: NEGATIVE
Hgb urine dipstick: NEGATIVE
Ketones, ur: NEGATIVE
Leukocytes,Ua: NEGATIVE
Nitrite: POSITIVE — AB
RBC / HPF: NONE SEEN (ref 0–?)
Specific Gravity, Urine: 1.015 (ref 1.000–1.030)
Total Protein, Urine: NEGATIVE
Urine Glucose: NEGATIVE
Urobilinogen, UA: 0.2 (ref 0.0–1.0)
pH: 6.5 (ref 5.0–8.0)

## 2023-03-30 LAB — ADD ON BMP
BUN: 19 mg/dL (ref 7–25)
CO2: 22 mmol/L (ref 20–32)
Calcium: 9.3 mg/dL (ref 8.6–10.4)
Chloride: 93 mmol/L — ABNORMAL LOW (ref 98–110)
Creat: 0.79 mg/dL (ref 0.60–0.95)
Glucose, Bld: 99 mg/dL (ref 65–99)
Potassium: 4.2 mmol/L (ref 3.5–5.3)
Sodium: 130 mmol/L — ABNORMAL LOW (ref 135–146)
eGFR: 74 mL/min/{1.73_m2} (ref >=60)

## 2023-03-30 LAB — TSH: TSH: 5.33 m[IU]/L — ABNORMAL HIGH (ref 0.40–4.50)

## 2023-03-30 LAB — T4, FREE: Free T4: 1.7 ng/dL (ref 0.8–1.8)

## 2023-03-30 LAB — TEST AUTHORIZATION

## 2023-03-30 MED ORDER — LEVOTHYROXINE SODIUM 125 MCG PO TABS
125.0000 ug | ORAL_TABLET | Freq: Every day | ORAL | 3 refills | Status: DC
Start: 2023-03-30 — End: 2023-09-16

## 2023-03-31 LAB — SODIUM, URINE, RANDOM: Sodium, Ur: 112 mmol/L (ref 28–272)

## 2023-04-01 ENCOUNTER — Encounter: Payer: Self-pay | Admitting: Internal Medicine

## 2023-04-01 NOTE — Addendum Note (Signed)
Addended by: Pollie Meyer on: 04/01/2023 11:01 AM   Modules accepted: Orders

## 2023-04-03 ENCOUNTER — Encounter: Payer: Self-pay | Admitting: Internal Medicine

## 2023-04-04 ENCOUNTER — Other Ambulatory Visit: Payer: Self-pay

## 2023-04-04 ENCOUNTER — Encounter (HOSPITAL_COMMUNITY): Payer: Self-pay

## 2023-04-04 ENCOUNTER — Emergency Department (HOSPITAL_COMMUNITY)
Admission: EM | Admit: 2023-04-04 | Discharge: 2023-04-04 | Disposition: A | Discharge status: Home / Self Care | Payer: Medicare HMO | Attending: Emergency Medicine | Admitting: Emergency Medicine

## 2023-04-04 ENCOUNTER — Emergency Department (HOSPITAL_COMMUNITY): Payer: Medicare HMO

## 2023-04-04 DIAGNOSIS — K449 Diaphragmatic hernia without obstruction or gangrene: Secondary | ICD-10-CM | POA: Diagnosis not present

## 2023-04-04 DIAGNOSIS — Z7901 Long term (current) use of anticoagulants: Secondary | ICD-10-CM | POA: Insufficient documentation

## 2023-04-04 DIAGNOSIS — I159 Secondary hypertension, unspecified: Secondary | ICD-10-CM | POA: Diagnosis not present

## 2023-04-04 DIAGNOSIS — N390 Urinary tract infection, site not specified: Secondary | ICD-10-CM | POA: Diagnosis not present

## 2023-04-04 DIAGNOSIS — I10 Essential (primary) hypertension: Secondary | ICD-10-CM | POA: Diagnosis not present

## 2023-04-04 DIAGNOSIS — Z743 Need for continuous supervision: Secondary | ICD-10-CM | POA: Diagnosis not present

## 2023-04-04 LAB — CBC
HCT: 41.6 % (ref 36.0–46.0)
Hemoglobin: 14 g/dL (ref 12.0–15.0)
MCH: 33 pg (ref 26.0–34.0)
MCHC: 33.7 g/dL (ref 30.0–36.0)
MCV: 98.1 fL (ref 80.0–100.0)
Platelets: 146 10*3/uL — ABNORMAL LOW (ref 150–400)
RBC: 4.24 MIL/uL (ref 3.87–5.11)
RDW: 12.8 % (ref 11.5–15.5)
WBC: 4.7 10*3/uL (ref 4.0–10.5)
nRBC: 0 % (ref 0.0–0.2)

## 2023-04-04 LAB — BASIC METABOLIC PANEL
Anion gap: 15 (ref 5–15)
BUN: 14 mg/dL (ref 8–23)
CO2: 23 mmol/L (ref 22–32)
Calcium: 9.3 mg/dL (ref 8.9–10.3)
Chloride: 92 mmol/L — ABNORMAL LOW (ref 98–111)
Creatinine, Ser: 0.67 mg/dL (ref 0.44–1.00)
GFR, Estimated: >60 mL/min (ref >60)
Glucose, Bld: 101 mg/dL — ABNORMAL HIGH (ref 70–99)
Potassium: 3.9 mmol/L (ref 3.5–5.1)
Sodium: 130 mmol/L — ABNORMAL LOW (ref 135–145)

## 2023-04-04 LAB — TROPONIN I (HIGH SENSITIVITY)
Troponin I (High Sensitivity): 13 ng/L (ref <18)
Troponin I (High Sensitivity): 14 ng/L (ref <18)

## 2023-04-04 NOTE — ED Triage Notes (Signed)
Pt arrived via [facility] from [facility] for c/o HTN. Pt told EMS her doctors have been changing her meds around for 3 wks. Pt denies chest pain, HA, or vision changes. Per EMS, pt started to feel dizzy when EMS was coming down ramp to hosp, but it has resolved. Pt c/o fatigue

## 2023-04-04 NOTE — ED Provider Triage Note (Signed)
Emergency Medicine Provider Triage Evaluation Note  Stacy [name] , a 82 y.o. female  was evaluated in triage.  Pt complains of HTN. Patient states recently had medication changes to her anti-HTN medications and feels that she is currently poorly controlled. Denies chest pain, shortness of breath, leg swelling, or other symptoms. Did report an episode of cloudy urine this morning but denies dysuria or hematuria. Also endorsed one episode of darker stool. On Eliquis but no history of GI bleed.  Review of Systems  Positive: As above Negative: As above  Physical Exam  BP (!) 194/90 (BP Location: Left Arm)   Pulse 80   Temp 98 F (36.7 C)   Resp 19   Ht 5\' 5"  (1.651 m)   Wt 54.7 kg   SpO2 97%   BMI 20.07 kg/m  Gen:   Awake, no distress   Resp:  Normal effort  MSK:   Moves extremities without difficulty  Other:    Medical Decision Making  Medically screening exam initiated at 3:32 PM.  Appropriate orders placed.  Stacy [name] was informed that the remainder of the evaluation will be completed by another provider, this initial triage assessment does not replace that evaluation, and the importance of remaining in the ED until their evaluation is complete.     Stacy Knudsen, PA-C 04/04/23 1533

## 2023-04-04 NOTE — ED Provider Notes (Signed)
Stacy [name] EMERGENCY DEPARTMENT AT [facility] Provider Note   CSN: [id number] Arrival date & time: 04/04/23  1506     History  Chief Complaint  Patient presents with   Hypertension    Stacy [name] is a 82 y.o. female.  HPI   82 year old female presents emergency department with concern for HTN.  Patient is an independent living facility.  She states that she is being followed as an outpatient for possible UTI.  When she woke up this morning she felt slightly more fatigued.  When she called the on-call service they came over to evaluate her and they found that her blood pressure was elevated.  She had no associated symptoms including headache, chest pain, shortness of breath, swelling of her lower extremities.  She states that but she became very anxious and the blood pressure Uptrending which is what prompted them to send her here for evaluation.  On my evaluation she feels improved, still anxious but more back to baseline.  She states she is having intermittent dysuria but is following up results with her primary doctor tomorrow in regards to possible UTI.  She otherwise states that her Cardizem was recently adjusted and increased due to an episode of palpitations.  Home Medications Prior to Admission medications   Medication Sig Start Date End Date Taking? Authorizing Provider  Cholecalciferol (VITAMIN D-3) 1000 UNITS CAPS Take 1,000 Units by mouth 2 (two) times daily.     [provider]  diltiazem (CARTIA XT) 180 MG 24 hr capsule Take 1 capsule (180 mg total) by mouth at bedtime. 03/25/23   Hilty, Lisette Abu, MD  ELIQUIS 2.5 MG TABS tablet Take 2.5 mg by mouth 2 (two) times daily. 02/26/23   [provider]  levothyroxine (SYNTHROID) 125 MCG tablet Take 1 tablet (125 mcg total) by mouth daily before breakfast. 03/30/23   Carlus Pavlov, MD  LORazepam (ATIVAN) 0.5 MG tablet Take 1 tablet (0.5 mg total) by mouth 2 (two) times daily as needed for anxiety.  12/13/20   Mast, Man X, NP  Multiple Vitamin (MULTIVITAMIN) capsule Take 1 capsule by mouth daily.    [provider]  Multiple Vitamins-Minerals (PRESERVISION AREDS PO) Take 1 tablet by mouth daily.    [provider]      Allergies    Amoxicillin, Flonase [fluticasone propionate], Sulfa antibiotics, and Sulfasalazine    Review of Systems   Review of Systems  Constitutional:  Positive for fatigue. Negative for fever.  Respiratory:  Negative for chest tightness and shortness of breath.   Cardiovascular:  Negative for chest pain and leg swelling.  Gastrointestinal:  Negative for abdominal pain, diarrhea and vomiting.  Genitourinary:  Positive for dysuria.  Skin:  Negative for rash.  Neurological:  Negative for headaches.    Physical Exam Updated Vital Signs BP (!) 167/96 (BP Location: Left Arm)   Pulse 78   Temp 97.9 F (36.6 C) (Oral)   Resp 15   Ht 5\' 5"  (1.651 m)   Wt 54.7 kg   SpO2 100%   BMI 20.07 kg/m  Physical Exam Vitals and nursing note reviewed.  Constitutional:      General: She is not in acute distress.    Appearance: Normal appearance. She is not ill-appearing.  HENT:     Head: Normocephalic.     Mouth/Throat:     Mouth: Mucous membranes are moist.  Cardiovascular:     Rate and Rhythm: Normal rate.  Pulmonary:     Effort: Pulmonary  effort is normal. No respiratory distress.     Breath sounds: No wheezing.  Abdominal:     Palpations: Abdomen is soft.     Tenderness: There is no abdominal tenderness.  Musculoskeletal:        General: No swelling.  Skin:    General: Skin is warm.  Neurological:     Mental Status: She is alert and oriented to person, place, and time. Mental status is at baseline.  Psychiatric:        Mood and Affect: Mood normal.     ED Results / Procedures / Treatments   Labs (all labs ordered are listed, but only abnormal results are displayed) Labs Reviewed  BASIC METABOLIC PANEL - Abnormal; Notable for the  following components:      Result Value   Sodium 130 (*)    Chloride 92 (*)    Glucose, Bld 101 (*)    All other components within normal limits  CBC - Abnormal; Notable for the following components:   Platelets 146 (*)    All other components within normal limits  URINALYSIS, ROUTINE W REFLEX MICROSCOPIC  TROPONIN I (HIGH SENSITIVITY)  TROPONIN I (HIGH SENSITIVITY)    EKG EKG Interpretation Date/Time:  Saturday April 04 2023 15:23:24 EDT Ventricular Rate:  72 PR Interval:  198 QRS Duration:  92 QT Interval:  374 QTC Calculation: 409 R Axis:   -59  Text Interpretation: Normal sinus rhythm Left anterior fascicular block Possible Anterior infarct , age undetermined Abnormal ECG When compared with ECG of 06-Mar-2023 14:44, PREVIOUS ECG IS PRESENT Similar to previous Confirmed by Coralee Pesa [phone]) on 04/04/2023 5:42:16 PM  Radiology DG Chest 2 View  Result Date: 04/04/2023 CLINICAL DATA:  Hypertension.  Persistent urinary tract infections. EXAM: CHEST - 2 VIEW COMPARISON:  Radiographs 02/21/2023 and 04/07/2015.  CT 04/07/2015. FINDINGS: The heart size and mediastinal contours are grossly stable. There is a large hiatal hernia. There is a severe convex left thoracolumbar scoliosis which appears unchanged. No evidence of acute fracture. The lungs appear grossly clear, although evaluation is limited by the hiatal hernia and scoliosis. No evidence of pleural effusion or pneumothorax. IMPRESSION: No evidence of acute cardiopulmonary process. Large hiatal hernia and severe thoracolumbar scoliosis, similar to prior studies. Electronically Signed   By: Carey Bullocks M.D.   On: 04/04/2023 16:48    Procedures Procedures    Medications Ordered in ED Medications - No data to display  ED Course/ Medical Decision Making/ A&P                                 Medical Decision Making  82 year old female presents emergency department for evaluation of HTN.  Recently she has been taken off  [employer] and was started on Cardizem, recently this dose was increased as well.  She has no concerning symptoms in regards to this episode of HTN.  On my evaluation the blood pressure is naturally downtrending.  Work appears reassuring, no acute findings, she feels back to baseline, has no acute complaints.  In regards to the HTN with them recently adjusting her Cardizem I will not address this with new medication.  She understands that she has to follow-up as an outpatient for reevaluation of BP and possible medication adjustment.  In regards to intermittent dysuria she is following up with her primary doctor in regards to outpatient UA results and will be calling the on-call doctor tomorrow.  She declines to have repeat UA or have me send any antibiotics at this time.  Patient at this time appears safe and stable for discharge and close outpatient follow up. Discharge plan and strict return to ED precautions discussed, patient verbalizes understanding and agreement.        Final Clinical Impression(s) / ED Diagnoses Final diagnoses:  Secondary hypertension    Rx / DC Orders ED Discharge Orders     None         Rozelle Logan, DO 04/04/23 1912

## 2023-04-04 NOTE — Discharge Instructions (Signed)
You have been seen and discharged from the emergency department.  Your blood pressure was elevated but started to downtrend while you are here in the department.  We are not going to adjust any of your medications at this visit.  Please follow-up with your primary doctor in regards to that.  Otherwise your blood work and heart workup were normal.  Follow-up with your primary provider for further evaluation and further care. Take home medications as prescribed. If you have any worsening symptoms or further concerns for your health please return to an emergency department for further evaluation.

## 2023-04-06 DIAGNOSIS — R350 Frequency of micturition: Secondary | ICD-10-CM | POA: Diagnosis not present

## 2023-04-07 ENCOUNTER — Ambulatory Visit: Payer: Medicare HMO | Admitting: Adult Health

## 2023-04-09 ENCOUNTER — Ambulatory Visit: Payer: Medicare HMO | Admitting: Student

## 2023-04-09 ENCOUNTER — Encounter: Payer: Self-pay | Admitting: Internal Medicine

## 2023-04-09 DIAGNOSIS — D6869 Other thrombophilia: Secondary | ICD-10-CM | POA: Diagnosis not present

## 2023-04-09 DIAGNOSIS — I10 Essential (primary) hypertension: Secondary | ICD-10-CM | POA: Diagnosis not present

## 2023-04-09 DIAGNOSIS — E871 Hypo-osmolality and hyponatremia: Secondary | ICD-10-CM | POA: Diagnosis not present

## 2023-04-09 DIAGNOSIS — N952 Postmenopausal atrophic vaginitis: Secondary | ICD-10-CM | POA: Diagnosis not present

## 2023-04-09 DIAGNOSIS — I4891 Unspecified atrial fibrillation: Secondary | ICD-10-CM | POA: Diagnosis not present

## 2023-04-09 DIAGNOSIS — Z681 Body mass index (BMI) 19 or less, adult: Secondary | ICD-10-CM | POA: Diagnosis not present

## 2023-04-09 DIAGNOSIS — F411 Generalized anxiety disorder: Secondary | ICD-10-CM | POA: Diagnosis not present

## 2023-04-09 DIAGNOSIS — N390 Urinary tract infection, site not specified: Secondary | ICD-10-CM | POA: Diagnosis not present

## 2023-04-13 ENCOUNTER — Other Ambulatory Visit (INDEPENDENT_AMBULATORY_CARE_PROVIDER_SITE_OTHER): Payer: Medicare HMO

## 2023-04-13 DIAGNOSIS — E871 Hypo-osmolality and hyponatremia: Secondary | ICD-10-CM | POA: Diagnosis not present

## 2023-04-13 DIAGNOSIS — E039 Hypothyroidism, unspecified: Secondary | ICD-10-CM | POA: Diagnosis not present

## 2023-04-13 LAB — BASIC METABOLIC PANEL
BUN: 22 mg/dL (ref 6–23)
CO2: 31 meq/L (ref 19–32)
Calcium: 9.5 mg/dL (ref 8.4–10.5)
Chloride: 93 meq/L — ABNORMAL LOW (ref 96–112)
Creatinine, Ser: 0.76 mg/dL (ref 0.40–1.20)
GFR: 72.41 mL/min (ref >60.00)
Glucose, Bld: 91 mg/dL (ref 70–99)
Potassium: 3.8 meq/L (ref 3.5–5.1)
Sodium: 132 meq/L — ABNORMAL LOW (ref 135–145)

## 2023-04-13 LAB — T4, FREE: Free T4: 1.46 ng/dL (ref 0.60–1.60)

## 2023-04-13 LAB — TSH: TSH: 5.37 u[IU]/mL (ref 0.35–5.50)

## 2023-04-14 DIAGNOSIS — L820 Inflamed seborrheic keratosis: Secondary | ICD-10-CM | POA: Diagnosis not present

## 2023-04-14 DIAGNOSIS — L570 Actinic keratosis: Secondary | ICD-10-CM | POA: Diagnosis not present

## 2023-04-16 ENCOUNTER — Encounter (INDEPENDENT_AMBULATORY_CARE_PROVIDER_SITE_OTHER): Payer: Medicare HMO | Admitting: Ophthalmology

## 2023-04-16 ENCOUNTER — Encounter (INDEPENDENT_AMBULATORY_CARE_PROVIDER_SITE_OTHER): Payer: Self-pay

## 2023-04-16 DIAGNOSIS — D6869 Other thrombophilia: Secondary | ICD-10-CM | POA: Diagnosis not present

## 2023-04-16 DIAGNOSIS — M810 Age-related osteoporosis without current pathological fracture: Secondary | ICD-10-CM | POA: Diagnosis not present

## 2023-04-16 DIAGNOSIS — Z1331 Encounter for screening for depression: Secondary | ICD-10-CM | POA: Diagnosis not present

## 2023-04-16 DIAGNOSIS — I10 Essential (primary) hypertension: Secondary | ICD-10-CM | POA: Diagnosis not present

## 2023-04-16 DIAGNOSIS — I480 Paroxysmal atrial fibrillation: Secondary | ICD-10-CM | POA: Diagnosis not present

## 2023-04-16 DIAGNOSIS — Z681 Body mass index (BMI) 19 or less, adult: Secondary | ICD-10-CM | POA: Diagnosis not present

## 2023-04-16 DIAGNOSIS — Z0000 Encounter for general adult medical examination without abnormal findings: Secondary | ICD-10-CM | POA: Diagnosis not present

## 2023-04-16 DIAGNOSIS — Z23 Encounter for immunization: Secondary | ICD-10-CM | POA: Diagnosis not present

## 2023-04-16 DIAGNOSIS — G479 Sleep disorder, unspecified: Secondary | ICD-10-CM | POA: Diagnosis not present

## 2023-04-16 DIAGNOSIS — N952 Postmenopausal atrophic vaginitis: Secondary | ICD-10-CM | POA: Diagnosis not present

## 2023-04-16 DIAGNOSIS — F411 Generalized anxiety disorder: Secondary | ICD-10-CM | POA: Diagnosis not present

## 2023-04-16 DIAGNOSIS — N390 Urinary tract infection, site not specified: Secondary | ICD-10-CM | POA: Diagnosis not present

## 2023-04-17 ENCOUNTER — Encounter (HOSPITAL_BASED_OUTPATIENT_CLINIC_OR_DEPARTMENT_OTHER): Payer: Self-pay | Admitting: Internal Medicine

## 2023-04-17 DIAGNOSIS — Z961 Presence of intraocular lens: Secondary | ICD-10-CM | POA: Diagnosis not present

## 2023-04-17 DIAGNOSIS — H52203 Unspecified astigmatism, bilateral: Secondary | ICD-10-CM | POA: Diagnosis not present

## 2023-04-19 ENCOUNTER — Encounter: Payer: Self-pay | Admitting: Internal Medicine

## 2023-04-20 ENCOUNTER — Other Ambulatory Visit: Payer: Self-pay | Admitting: Internal Medicine

## 2023-05-03 DIAGNOSIS — R300 Dysuria: Secondary | ICD-10-CM | POA: Diagnosis not present

## 2023-05-03 DIAGNOSIS — Z681 Body mass index (BMI) 19 or less, adult: Secondary | ICD-10-CM | POA: Diagnosis not present

## 2023-05-03 DIAGNOSIS — I10 Essential (primary) hypertension: Secondary | ICD-10-CM | POA: Diagnosis not present

## 2023-05-06 DIAGNOSIS — E871 Hypo-osmolality and hyponatremia: Secondary | ICD-10-CM | POA: Diagnosis not present

## 2023-05-06 DIAGNOSIS — E039 Hypothyroidism, unspecified: Secondary | ICD-10-CM | POA: Diagnosis not present

## 2023-05-06 DIAGNOSIS — I10 Essential (primary) hypertension: Secondary | ICD-10-CM | POA: Diagnosis not present

## 2023-05-06 DIAGNOSIS — I444 Left anterior fascicular block: Secondary | ICD-10-CM | POA: Diagnosis not present

## 2023-05-06 DIAGNOSIS — F5101 Primary insomnia: Secondary | ICD-10-CM | POA: Diagnosis not present

## 2023-05-06 DIAGNOSIS — R030 Elevated blood-pressure reading, without diagnosis of hypertension: Secondary | ICD-10-CM | POA: Diagnosis not present

## 2023-05-06 DIAGNOSIS — E7800 Pure hypercholesterolemia, unspecified: Secondary | ICD-10-CM | POA: Diagnosis not present

## 2023-05-06 DIAGNOSIS — N390 Urinary tract infection, site not specified: Secondary | ICD-10-CM | POA: Diagnosis not present

## 2023-05-13 NOTE — Progress Notes (Signed)
Cardiology Clinic Note   Patient Name: Stacy [name] Date of Encounter: 05/15/2023  Primary Care Provider:  Jamey Reas, PA-C Primary Cardiologist:  Chrystie Nose, MD  Patient Profile    82 year old female with history of paroxysmal atrial fibrillation, CHA2DS2-VASc score of 5 on Eliquis 2.5 mg twice daily,, carotid artery stenosis, dyslipidemia, hypertension, hypothyroidism, osteopenia, and vitamin D deficiency.  Last seen in the office on 03/07/2023 for and continued on medication regimen of diltiazem 120 mg daily.  Irbesartan 150 mg daily.  At the time she was having frequent diarrhea and having lightheadedness and she was given recommendations for hydration with Pedialyte.  Past Medical History    Past Medical History:  Diagnosis Date   Atrial fibrillation (HCC)    echo 12/23/10 EF= >55%, stress myoview 01/30/11 normal pattern of perfusion in all myocardial regions   Carotid artery stenosis    Per [facility] new patient packet    Chicken pox    Colon polyp    Colon polyp    Dyslipidemia    Heart murmur    Hiatal hernia    Hypertension    Hypothyroidism    Laryngopharyngeal reflux    Osteopenia    Scoliosis    Seasonal allergies    Thyroid disease    Vitamin D deficiency    Past Surgical History:  Procedure Laterality Date   BREAST CYST EXCISION Right 1988   ESOPHAGEAL MANOMETRY N/A 08/29/2015   Procedure: ESOPHAGEAL MANOMETRY (EM);  Surgeon: Ruffin Frederick, MD;  Location: [facility];  Service: Gastroenterology;  Laterality: N/A;   EYE SURGERY     Cataract eye surgery-right 06/2014, left 04/2014   ORIF PATELLA Right 07/08/2022   Procedure: OPEN REDUCTION INTERNAL FIXATION (ORIF) PATELLA;  Surgeon: Teryl Lucy, MD;  Location: [facility];  Service: Orthopedics;  Laterality: Right;   TONSILLECTOMY     Childhood    Allergies  Allergies  Allergen Reactions   Amoxicillin Swelling   Flonase [Fluticasone Propionate] Swelling    Swelling of  throat per patient    Sulfa Antibiotics Other (See Comments)    Upset stomach   Sulfasalazine Other (See Comments)    Upset stomach    History of Present Illness    [name] comes today feeling much better than when I saw her last.  She denies any further issues with dehydration or diarrhea.  She denies any chest pain, frequent palpitations, dyspnea on exertion, or fatigue.  Her main complaint today is frequent UTIs and she has made an appointment to see urologist November 2024 for further evaluation.  She has changed primary care providers from Dr. Uvaldo Rising to a provider at [facility].  Of note since last appointment irbesartan has been discontinued.  Eliquis 2.5 mg twice daily has been added due to history of PAF, by her former primary care provider after discussion with cardiology by phone.  She is back to the fitness center 3 times a week walking on the treadmill for 30 minutes and then doing 30 minutes of machine weight resistance training to build back muscles due to some deconditioning associated with prior injury to her knee and broken patella.  Home Medications    Current Outpatient Medications  Medication Sig Dispense Refill   Cholecalciferol (VITAMIN D-3) 1000 UNITS CAPS Take 1,000 Units by mouth 2 (two) times daily.      diltiazem (CARTIA XT) 180 MG 24 hr capsule Take 1 capsule (180 mg total) by mouth at bedtime. 90 capsule 3  ELIQUIS 2.5 MG TABS tablet Take 2.5 mg by mouth 2 (two) times daily.     levothyroxine (SYNTHROID) 125 MCG tablet Take 1 tablet (125 mcg total) by mouth daily before breakfast. 45 tablet 3   LORazepam (ATIVAN) 0.5 MG tablet Take 1 tablet (0.5 mg total) by mouth 2 (two) times daily as needed for anxiety. 30 tablet 2   Multiple Vitamin (MULTIVITAMIN) capsule Take 1 capsule by mouth daily.     Multiple Vitamins-Minerals (PRESERVISION AREDS PO) Take 1 tablet by mouth daily.     No current facility-administered medications for this visit.     Family  History    Family History  Problem Relation Age of Onset   CVA Mother    Hyperlipidemia Mother    Hypertension Mother    Anuerysm Father        brain   Stroke Maternal Grandmother    Hypertension Maternal Grandmother    Hypertension Sister    Scoliosis Sister    Colon cancer Neg Hx    Heart attack Neg Hx    Breast cancer Neg Hx    She indicated that her mother is deceased. She indicated that her father is deceased. She indicated that her sister is alive. She indicated that her maternal grandmother is deceased. She indicated that the status of her neg hx is unknown.  Social History    Social History   Socioeconomic History   Marital status: Single    Spouse name: Not on file   Number of children: Not on file   Years of education: Not on file   Highest education level: Not on file  Occupational History   Occupation: retired  Tobacco Use   Smoking status: Never   Smokeless tobacco: Never  Substance and Sexual Activity   Alcohol use: Not Currently    Alcohol/week: 1.0 standard drink of alcohol    Types: 1 Glasses of wine per week    Comment: 1 glass of wine 3x a week   Drug use: No   Sexual activity: Not on file  Other Topics Concern   Not on file  Social History Narrative   Work or School: Clinical research associate - poetry      Home Situation: lives alone      Spiritual Beliefs: Jewish      Lifestyle[facility] [employer] [facility]; diet healthy      Per [facility] New Patient Packet:      Diet: Heart healthy, low-fat      Caffeine: 1 cup of tea daily       Married, if yes what year: Single      Do you live in a house, apartment, assisted living, condo, trailer, ect: Apartment, 1 person, 7 stories       Pets: No      Current/Past profession: PHD, Child psychotherapist      Exercise: Yes, Treadmill, weights, and walking          Living Will: Yes   DNR: Yes   POA/HPOA: Yes      Functional Status:   Do you have difficulty bathing or dressing yourself? No   Do you have difficulty  preparing food or eating? No   Do you have difficulty managing your medications? No   Do you have difficulty managing your finances? No   Do you have difficulty affording your medications? No         Social Determinants of Health   Financial Resource Strain: Not on file  Food Insecurity: Not on file  Transportation Needs: Not on file  Physical Activity: Not on file  Stress: Not on file  Social Connections: Not on file  Intimate Partner Violence: Not on file     Review of Systems    General:  No chills, fever, night sweats or weight changes.  Cardiovascular:  No chest pain, dyspnea on exertion, edema, orthopnea, palpitations, paroxysmal nocturnal dyspnea. Dermatological: No rash, lesions/masses Respiratory: No cough, dyspnea Urologic: No hematuria, dysuria Abdominal:   No nausea, vomiting, diarrhea, bright red blood per rectum, melena, or hematemesis Neurologic:  No visual changes, wkns, changes in mental status. All other systems reviewed and are otherwise negative except as noted above.       Physical Exam    VS:  BP (!) 140/80 (BP Location: Left Arm, Patient Position: Sitting, Cuff Size: Normal)   Pulse 80   Ht 5\' 5"  (1.651 m)   Wt 117 lb (53.1 kg)   SpO2 94%   BMI 19.47 kg/m  , BMI Body mass index is 19.47 kg/m.     GEN: Well nourished, well developed, in no acute distress. HEENT: normal. Neck: Supple, no JVD, carotid bruits, or masses. Cardiac: IRRR, 2/6 holosystolic murmurs, rubs, or gallops. No clubbing, cyanosis, edema.  Radials/DP/PT 2+ and equal bilaterally.  Respiratory:  Respirations regular and unlabored, clear to auscultation bilaterally. GI: Soft, nontender, nondistended, BS + x 4. MS: no deformity or atrophy. Skin: warm and dry, no rash. Neuro:  Strength and sensation are intact. Psych: Normal affect.      Lab Results  Component Value Date   WBC 4.7 04/04/2023   HGB 14.0 04/04/2023   HCT 41.6 04/04/2023   MCV 98.1 04/04/2023   PLT 146 (L)  04/04/2023   Lab Results  Component Value Date   CREATININE 0.76 04/13/2023   BUN 22 04/13/2023   NA 132 (L) 04/13/2023   K 3.8 04/13/2023   CL 93 (L) 04/13/2023   CO2 31 04/13/2023   Lab Results  Component Value Date   ALT 17 03/12/2020   AST 20 03/12/2020   ALKPHOS 69 12/19/2010   BILITOT 0.8 03/12/2020   Lab Results  Component Value Date   CHOL 272 (H) 03/12/2020   HDL 83 03/12/2020   LDLCALC 173 (H) 03/12/2020   LDLDIRECT 148.1 08/17/2013   TRIG 64 03/12/2020   CHOLHDL 3.3 03/12/2020    No results found for: "HGBA1C"   Review of Prior Studies    Carotid Studies Summary:  Right Carotid: Velocities in the right ICA are consistent with a 1-39%  stenosis.                Unable to duplicate velocities from prior exam. Patient's  mid ICA                 is tortuous which would account for a higher velocity  sampled                previously.   Left Carotid: Velocities in the left ICA are consistent with a 1-39%  stenosis.   Vertebrals: Bilateral vertebral arteries demonstrate antegrade flow.  Subclavians: Normal flow hemodynamics were seen in bilateral subclavian               arteries.   Assessment & Plan   1.  Paroxysmal atrial fibrillation: Most recent EKG revealed normal sinus rhythm.  Today heart rate irregular.  She is currently on Eliquis 2.5 mg twice daily placed by primary care  provider.  She denies any bleeding hemoptysis or melena associated with use of the Eliquis.  Heart rate is well-controlled currently.  Continue diltiazem.  It is okay for her to take it during the daytime instead of taking it at night.  2.  Hypertension: Irbesartan was discontinued in the setting of mild hyponatremia.  Blood pressure is slightly elevated today.  She is to keep the blood pressure at home and report any significant elevations greater than 160/90.  At which time we may need to adjust diltiazem dose as irbesartan was discontinued for better blood pressure management.  She  has not had echocardiogram in several years.  Will need to have follow-up echocardiogram due to systolic murmur and for evaluation of status with longstanding hypertension and PAF.  3.  Hypothyroidism: She has an endocrinologist.  Labs and follow-ups per their discretion.  4.  Frequent UTIs: Now being established with a urologist for further evaluation and management with testing at their discretion.  Due to be seen November 2024.         Signed, Bettey Mare. Liborio Nixon, ANP, AACC   05/15/2023 11:03 AM      Office [phone] Fax [phone]  Notice: This dictation was prepared with Dragon dictation along with smaller phrase technology. Any transcriptional errors that result from this process are unintentional and may not be corrected upon review.

## 2023-05-15 ENCOUNTER — Encounter: Payer: Self-pay | Admitting: Adult Health

## 2023-05-15 ENCOUNTER — Ambulatory Visit: Payer: Medicare HMO | Attending: Adult Health | Admitting: Adult Health

## 2023-05-15 VITALS — BP 140/80 | HR 80 | Ht 65.0 in | Wt 117.0 lb

## 2023-05-15 DIAGNOSIS — I480 Paroxysmal atrial fibrillation: Secondary | ICD-10-CM

## 2023-05-15 DIAGNOSIS — I10 Essential (primary) hypertension: Secondary | ICD-10-CM | POA: Diagnosis not present

## 2023-05-15 DIAGNOSIS — E039 Hypothyroidism, unspecified: Secondary | ICD-10-CM | POA: Diagnosis not present

## 2023-05-15 NOTE — Patient Instructions (Signed)
Medication Instructions:  No Changes *If you need a refill on your cardiac medications before your next appointment, please call your pharmacy*   Lab Work: No Labs If you have labs (blood work) drawn today and your tests are completely normal, you will receive your results only by: [place] (if you have MyChart) OR A paper copy in the mail If you have any lab test that is abnormal or we need to change your treatment, we will call you to review the results.   Testing/Procedures: No Testing   Follow-Up: At [facility], you and your health needs are our priority.  As part of our continuing mission to provide you with exceptional heart care, we have created designated Provider Care Teams.  These Care Teams include your primary Cardiologist (physician) and Advanced Practice Providers (APPs -  Physician Assistants and Nurse Practitioners) who all work together to provide you with the care you need, when you need it.  We recommend signing up for the patient portal called "MyChart".  Sign up information is provided on this After Visit Summary.  MyChart is used to connect with patients for Virtual Visits (Telemedicine).  Patients are able to view lab/test results, encounter notes, upcoming appointments, etc.  Non-urgent messages can be sent to your provider as well.   To learn more about what you can do with MyChart, go to [email or web address].    Your next appointment:   6 month(s)  Provider:   Pixie Casino, MD

## 2023-05-18 ENCOUNTER — Other Ambulatory Visit: Payer: Self-pay | Admitting: Internal Medicine

## 2023-05-18 ENCOUNTER — Other Ambulatory Visit (INDEPENDENT_AMBULATORY_CARE_PROVIDER_SITE_OTHER): Payer: Medicare HMO

## 2023-05-18 DIAGNOSIS — E871 Hypo-osmolality and hyponatremia: Secondary | ICD-10-CM

## 2023-05-18 LAB — BASIC METABOLIC PANEL
BUN: 16 mg/dL (ref 6–23)
CO2: 30 meq/L (ref 19–32)
Calcium: 9.4 mg/dL (ref 8.4–10.5)
Chloride: 96 meq/L (ref 96–112)
Creatinine, Ser: 0.63 mg/dL (ref 0.40–1.20)
GFR: 81.93 mL/min (ref >60.00)
Glucose, Bld: 84 mg/dL (ref 70–99)
Potassium: 4.2 meq/L (ref 3.5–5.1)
Sodium: 133 meq/L — ABNORMAL LOW (ref 135–145)

## 2023-05-19 ENCOUNTER — Encounter: Payer: Self-pay | Admitting: Internal Medicine

## 2023-06-06 DIAGNOSIS — Z79899 Other long term (current) drug therapy: Secondary | ICD-10-CM | POA: Diagnosis not present

## 2023-06-06 DIAGNOSIS — R030 Elevated blood-pressure reading, without diagnosis of hypertension: Secondary | ICD-10-CM | POA: Diagnosis not present

## 2023-06-06 DIAGNOSIS — I444 Left anterior fascicular block: Secondary | ICD-10-CM | POA: Diagnosis not present

## 2023-06-06 DIAGNOSIS — E7800 Pure hypercholesterolemia, unspecified: Secondary | ICD-10-CM | POA: Diagnosis not present

## 2023-06-06 DIAGNOSIS — I10 Essential (primary) hypertension: Secondary | ICD-10-CM | POA: Diagnosis not present

## 2023-06-06 DIAGNOSIS — F5101 Primary insomnia: Secondary | ICD-10-CM | POA: Diagnosis not present

## 2023-06-06 DIAGNOSIS — F339 Major depressive disorder, recurrent, unspecified: Secondary | ICD-10-CM | POA: Diagnosis not present

## 2023-06-06 DIAGNOSIS — E039 Hypothyroidism, unspecified: Secondary | ICD-10-CM | POA: Diagnosis not present

## 2023-06-06 DIAGNOSIS — E871 Hypo-osmolality and hyponatremia: Secondary | ICD-10-CM | POA: Diagnosis not present

## 2023-06-17 DIAGNOSIS — R350 Frequency of micturition: Secondary | ICD-10-CM | POA: Diagnosis not present

## 2023-06-17 DIAGNOSIS — N3020 Other chronic cystitis without hematuria: Secondary | ICD-10-CM | POA: Diagnosis not present

## 2023-06-17 DIAGNOSIS — R351 Nocturia: Secondary | ICD-10-CM | POA: Diagnosis not present

## 2023-07-14 DIAGNOSIS — L820 Inflamed seborrheic keratosis: Secondary | ICD-10-CM | POA: Diagnosis not present

## 2023-07-14 DIAGNOSIS — L821 Other seborrheic keratosis: Secondary | ICD-10-CM | POA: Diagnosis not present

## 2023-07-14 DIAGNOSIS — L814 Other melanin hyperpigmentation: Secondary | ICD-10-CM | POA: Diagnosis not present

## 2023-07-14 DIAGNOSIS — D1801 Hemangioma of skin and subcutaneous tissue: Secondary | ICD-10-CM | POA: Diagnosis not present

## 2023-07-14 DIAGNOSIS — L570 Actinic keratosis: Secondary | ICD-10-CM | POA: Diagnosis not present

## 2023-09-01 DIAGNOSIS — R194 Change in bowel habit: Secondary | ICD-10-CM | POA: Diagnosis not present

## 2023-09-05 DIAGNOSIS — Z0100 Encounter for examination of eyes and vision without abnormal findings: Secondary | ICD-10-CM | POA: Diagnosis not present

## 2023-09-07 DIAGNOSIS — E559 Vitamin D deficiency, unspecified: Secondary | ICD-10-CM | POA: Diagnosis not present

## 2023-09-07 DIAGNOSIS — Z131 Encounter for screening for diabetes mellitus: Secondary | ICD-10-CM | POA: Diagnosis not present

## 2023-09-07 DIAGNOSIS — Z79899 Other long term (current) drug therapy: Secondary | ICD-10-CM | POA: Diagnosis not present

## 2023-09-07 DIAGNOSIS — E039 Hypothyroidism, unspecified: Secondary | ICD-10-CM | POA: Diagnosis not present

## 2023-09-07 DIAGNOSIS — D539 Nutritional anemia, unspecified: Secondary | ICD-10-CM | POA: Diagnosis not present

## 2023-09-07 DIAGNOSIS — E7800 Pure hypercholesterolemia, unspecified: Secondary | ICD-10-CM | POA: Diagnosis not present

## 2023-09-07 DIAGNOSIS — Z0000 Encounter for general adult medical examination without abnormal findings: Secondary | ICD-10-CM | POA: Diagnosis not present

## 2023-09-09 ENCOUNTER — Encounter: Payer: Self-pay | Admitting: Internal Medicine

## 2023-09-10 ENCOUNTER — Other Ambulatory Visit: Payer: Self-pay | Admitting: Gastroenterology

## 2023-09-10 DIAGNOSIS — R194 Change in bowel habit: Secondary | ICD-10-CM

## 2023-09-15 DIAGNOSIS — L570 Actinic keratosis: Secondary | ICD-10-CM | POA: Diagnosis not present

## 2023-09-16 ENCOUNTER — Other Ambulatory Visit: Payer: Self-pay | Admitting: Internal Medicine

## 2023-09-16 DIAGNOSIS — E039 Hypothyroidism, unspecified: Secondary | ICD-10-CM

## 2023-09-16 NOTE — Telephone Encounter (Signed)
Levothyroxine refill request complete

## 2023-09-18 ENCOUNTER — Ambulatory Visit: Payer: Medicare HMO | Admitting: Adult Health

## 2023-09-22 DIAGNOSIS — M79675 Pain in left toe(s): Secondary | ICD-10-CM | POA: Diagnosis not present

## 2023-09-22 DIAGNOSIS — B351 Tinea unguium: Secondary | ICD-10-CM | POA: Diagnosis not present

## 2023-09-23 ENCOUNTER — Ambulatory Visit: Payer: Medicare HMO | Admitting: Internal Medicine

## 2023-09-25 ENCOUNTER — Encounter: Payer: Self-pay | Admitting: Internal Medicine

## 2023-09-25 ENCOUNTER — Ambulatory Visit (INDEPENDENT_AMBULATORY_CARE_PROVIDER_SITE_OTHER): Payer: Medicare HMO | Admitting: Internal Medicine

## 2023-09-25 VITALS — BP 140/80 | HR 96 | Ht 65.0 in | Wt 117.8 lb

## 2023-09-25 DIAGNOSIS — E039 Hypothyroidism, unspecified: Secondary | ICD-10-CM

## 2023-09-25 DIAGNOSIS — E871 Hypo-osmolality and hyponatremia: Secondary | ICD-10-CM

## 2023-09-25 NOTE — Progress Notes (Addendum)
 Patient ID: Stacy [name], female   DOB: May 24, 1941, 83 y.o.   MRN: [id number]  HPI  Stacy [name] is a 83 y.o.-year-old female-year-old female, previously seen for hypothyroidism but lost for follow-up after our visit from 84/2021, then returning 03/2023 for hyponatremia.  Last visit 6 months ago.  Interim hx: She has diarrhea alternating with constipation and gas. She increased her fluid intake since last visit to 5-6 glasses of water a day as she noticed that she has constipation if she drinks less. She has nocturia. At last visit, she changed her PCP.  She is seeing her every 3 months due to being on lorazepam.  Reviewed and addended history: Pt had mildly decreased sodium levels since 2022 per available lab results, with the lowest being 125 in 02/2023.    She was advised to do fluid restriction for water only, but patient mentions that this is very difficult for her to do and she is still orange juice and water, definitely for more than 32 ounce fluid restriction (but she cannot estimate how much).  She was also advised to come off of irbesartan (she remembers having started this in 2020-2021) due to the associated possible risk of hyponatremia.  Her sodium was measured every week afterwards and was around 126.  Upon questioning, she mentioned that she developed constipation at the end of 02/2023, then explosive diarrhea, then dehydration with dizziness.  On this background, she also developed A.fib with RVR, which corrected with hydration. She was started on Eliquis and her Cardizem dose was increased while off of irbesartan.   She was referred to endocrinology for further investigation.  When I saw her in 03/2022, her diarrhea was resolved.  Sodium improved to 130. Other labs reviewed: 03/27/2023:  Urine sodium 112 Urine specific gravity 1.015 Plasma osmolality 268 However she was found to have a UTI at that time >> resolved.  TSH was slightly elevated.  We continued to hold irbesartan.  As  of now, she drinks 5-6 glasses of water day. She feels she needs this much for constipation.   I reviewed pt's recent sodium levels:  Lab Results  Component Value Date   NA 133 (L) 05/18/2023   NA 132 (L) 04/13/2023   NA 130 (L) 04/04/2023   NA 130 (L) 03/27/2023   NA 129 (L) 02/21/2023   NA 132 (L) 09/30/2020   NA 135 03/12/2020   NA 136 04/06/2019   NA 137 03/17/2019   NA 139 12/08/2017   NA 140 08/25/2016   NA 138 08/22/2015   NA 140 08/18/2014   NA 139 08/17/2013   NA 142 12/19/2010   NA 142 12/18/2010  03/20/2023: Sodium 126 reportedly 03/02/2023: Sodium 125 per review of PCPs notes.  05/22/2022: Sodium 134 (136-145)  Per review of the chart, her hyponatremia is associated with hypochloremia but not alkalosis (see below) but not alkalosis.   I do not have a recent urine specific gravity available for review, but it was 1.006 in 2022.  She drinks >1L fluids a day.  She is trying to follow a Mediterranean diet, with not too much salt.  No swelling in legs or arms. No heart,  liver dysfunction.  Lab Results  Component Value Date   ALT 17 03/12/2020   AST 20 03/12/2020   ALKPHOS 69 12/19/2010   BILITOT 0.8 03/12/2020   No HTG: Lab Results  Component Value Date   CHOL 272 (H) 03/12/2020   HDL 83 03/12/2020   LDLCALC 173 (H) 03/12/2020  LDLDIRECT 148.1 08/17/2013   TRIG 64 03/12/2020   CHOLHDL 3.3 03/12/2020    Pt does not smoke.   Pt also has no h/o OP.  However, she had a closed nondisplaced comminuted fracture of the right patella on 07/07/2022 and had to have ORIF.  Of note, in the past she declined treatment for osteoporosis.  No Br CA history.  No dizziness, no orthostasis, no HAs, no blurry vision.  Hypothyroidism:  Pt is on levothyroxine 125 mcg daily, increased few mo ago: - in am - fasting - at least 30 min from b'fast - no calcium - no iron - + multivitamins at lunchtime - no PPIs - not on Biotin  09/07/2023: TSH 5.4 Lab Results   Component Value Date   TSH 5.37 04/13/2023   TSH 5.33 (H) 03/27/2023   TSH 5.31 (H) 09/12/2020   TSH 2.93 04/24/2020   TSH 8.87 (H) 03/20/2020   TSH 9.36 (H) 03/12/2020   TSH 3.39 05/04/2019   TSH 1.63 03/17/2019   TSH 0.51 12/06/2018   TSH 1.22 08/10/2017   TSH 0.37 08/25/2016   TSH 1.14 02/20/2016   TSH 0.64 08/22/2015   TSH 0.88 08/18/2014   TSH 1.18 08/17/2013   TSH 0.970 12/18/2010  [facility].  ROS: Patient does not describe headaches, current dizziness, nausea.  Past Medical History:  Diagnosis Date   Atrial fibrillation (HCC)    echo 12/23/10 EF= >55%, stress myoview 01/30/11 normal pattern of perfusion in all myocardial regions   Carotid artery stenosis    Per [facility] new patient packet    Chicken pox    Colon polyp    Colon polyp    Dyslipidemia    Heart murmur    Hiatal hernia    Hypertension    Hypothyroidism    Laryngopharyngeal reflux    Osteopenia    Scoliosis    Seasonal allergies    Thyroid disease    Vitamin D deficiency    Past Surgical History:  Procedure Laterality Date   BREAST CYST EXCISION Right 1988   ESOPHAGEAL MANOMETRY N/A 08/29/2015   Procedure: ESOPHAGEAL MANOMETRY (EM);  Surgeon: Ruffin Frederick, MD;  Location: [facility];  Service: Gastroenterology;  Laterality: N/A;   EYE SURGERY     Cataract eye surgery-right 06/2014, left 04/2014   ORIF PATELLA Right 07/08/2022   Procedure: OPEN REDUCTION INTERNAL FIXATION (ORIF) PATELLA;  Surgeon: Teryl Lucy, MD;  Location: [facility];  Service: Orthopedics;  Laterality: Right;   TONSILLECTOMY     Childhood   Social History   Socioeconomic History   Marital status: Single    Spouse name: Not on file   Number of children: Not on file   Years of education: Not on file   Highest education level: Not on file  Occupational History   Occupation: retired  Tobacco Use   Smoking status: Never   Smokeless tobacco: Never  Substance and Sexual Activity    Alcohol use: Not Currently    Alcohol/week: 1.0 standard drink of alcohol    Types: 1 Glasses of wine per week    Comment: 1 glass of wine 3x a week   Drug use: No   Sexual activity: Not on file  Other Topics Concern   Not on file  Social History Narrative   Work or School: Clinical research associate - poetry      Home Situation: lives alone      Spiritual Beliefs: Jewish      Lifestyle:  [facility] [employer] [facility]; diet healthy      Per [facility] New Patient Packet:      Diet: Heart healthy, low-fat      Caffeine: 1 cup of tea daily       Married, if yes what year: Single      Do you live in a house, apartment, assisted living, condo, trailer, ect: Apartment, 1 person, 7 stories       Pets: No      Current/Past profession: PHD, Child psychotherapist      Exercise: Yes, Treadmill, weights, and walking          Living Will: Yes   DNR: Yes   POA/HPOA: Yes      Functional Status:   Do you have difficulty bathing or dressing yourself? No   Do you have difficulty preparing food or eating? No   Do you have difficulty managing your medications? No   Do you have difficulty managing your finances? No   Do you have difficulty affording your medications? No         Social Drivers of Corporate investment banker Strain: Not on file  Food Insecurity: Not on file  Transportation Needs: Not on file  Physical Activity: Not on file  Stress: Not on file  Social Connections: Not on file  Intimate Partner Violence: Not on file   Current Outpatient Medications on File Prior to Visit  Medication Sig Dispense Refill   Cholecalciferol (VITAMIN D-3) 1000 UNITS CAPS Take 1,000 Units by mouth 2 (two) times daily.      diltiazem (CARTIA XT) 180 MG 24 hr capsule Take 1 capsule (180 mg total) by mouth at bedtime. (Patient taking differently: Take 180 mg by mouth every morning.) 90 capsule 3   ELIQUIS 2.5 MG TABS tablet Take 2.5 mg by mouth 2 (two) times daily.     levothyroxine (SYNTHROID) 125 MCG tablet TAKE 1  TABLET BY MOUTH DAILY BEFORE BREAKFAST. 90 tablet 1   LORazepam (ATIVAN) 0.5 MG tablet Take 1 tablet (0.5 mg total) by mouth 2 (two) times daily as needed for anxiety. 30 tablet 2   Multiple Vitamin (MULTIVITAMIN) capsule Take 1 capsule by mouth daily.     Multiple Vitamins-Minerals (PRESERVISION AREDS PO) Take 1 tablet by mouth daily.     No current facility-administered medications on file prior to visit.   Allergies  Allergen Reactions   Amoxicillin Swelling   Flonase [Fluticasone Propionate] Swelling    Swelling of throat per patient    Sulfa Antibiotics Other (See Comments)    Upset stomach   Sulfasalazine Other (See Comments)    Upset stomach   Family History  Problem Relation Age of Onset   CVA Mother    Hyperlipidemia Mother    Hypertension Mother    Anuerysm Father        brain   Stroke Maternal Grandmother    Hypertension Maternal Grandmother    Hypertension Sister    Scoliosis Sister    Colon cancer Neg Hx    Heart attack Neg Hx    Breast cancer Neg Hx    PE: BP (!) 140/80   Pulse 96   Ht 5\' 5"  (1.651 m)   Wt 117 lb 12.8 oz (53.4 kg)   SpO2 95%   BMI 19.60 kg/m  Wt Readings from Last 3 Encounters:  09/25/23 117 lb 12.8 oz (53.4 kg)  05/15/23 117 lb (53.1 kg)  04/04/23 120 lb 9.5 oz (54.7 kg)  Constitutional: normal weight, in NAD Eyes:  EOMI, no exophthalmos ENT: no neck masses, no cervical lymphadenopathy Cardiovascular: No tachycardia at the time of the exam, RR, No MRG, + BLE -wears compression hoses  Respiratory: CTA B Musculoskeletal: no deformities Skin:no rashes Neurological: no tremor with outstretched hands  ASSESSMENT: 1. Hyponatremia Patient with history of approximately 2 years of hyponatremia while on Irbesartan.  Lowest sodium was 125 obtained in 02/2023 after an episode of diarrhea, but this did not increase much despite attempts to fluid restriction (which she did not feel she could follow).  In 03/2023, sodium level was 126.   However, sodium started to improve after stopping irbesartan, to 130 at the time of our last visit and then 133 on 05/18/2023, at the latest check -I first saw the patient 6 months ago and at that time there was no indication of pseudohyponatremia (no hypertriglyceridemia or hypoproteinemia), dilutional hyponatremia (hyperkalemia, hyperglycemia).  A plasma osmolality was low, at 268. We did check a urinary sodium and this was higher than 30, at 112, so she did not have dehydration/fluid loss or fluid overload.  At last visit she had some mild swelling for which she was wearing compression hoses but this was not changed.  Since the urine sodium was high, we had to rule out hypothyroidism (her TSH was mildly high but not enough to cause an SIADH picture), CKD (kidney function was normal), adrenal insufficiency (suspicion for this was low due to lack of other signs or symptoms), and drugs/medications.  Since the sodium improved after starting irbesartan, we did not bring the patient back for a cosyntropin stimulation test and we discussed that she likely had irbesartan induced hyponatremia. -As of now, she remains off the ARB -at at today's visit she does not report dizziness, increased fatigue, headaches, or disequilibrium.  No falls. -Will recheck her sodium again today -I will see her back in 6 months  2.  Hypothyroidism - Patient with longstanding hypothyroidism, on levothyroxine therapy - latest thyroid labs reviewed with pt. >> normal: 09/07/2023: TSH 5.4 - she continues on LT4 125 mcg daily daily - pt feels good on this dose except for nocturia, and also diarrhea alternating with constipation, for which she sees GI. - we discussed about taking the thyroid hormone every day, with water, >30 minutes before breakfast, separated by >4 hours from acid reflux medications, calcium, iron, multivitamins. Pt. is taking it correctly. - will check thyroid tests next visit with PCP.  She has visit every 3  months due to being on Ativan. - OTW, I will see her back in 6 mo, but we discussed that if the labs are all normal, she can continue to follow-up with PCP only  Lab Results  Component Value Date   NA 134 (L) 09/25/2023   Dear Ms. Neisler, Your sodium is even better than before, now only slightly under the lower limit of the target range.  Sincerely, Carlus Pavlov MD  Carlus Pavlov, MD PhD [facility] Endocrinology

## 2023-09-25 NOTE — Patient Instructions (Addendum)
Please stop at the lab.  Continue levothyroxine 112 mcg daily.  Take the thyroid hormone every day, with water, at least 30 minutes before breakfast, separated by at least 4 hours from: - acid reflux medications - calcium - iron - multivitamins  Please return in 6 months.

## 2023-09-26 LAB — SODIUM: Sodium: 134 mmol/L — ABNORMAL LOW (ref 135–146)

## 2023-09-29 ENCOUNTER — Encounter: Payer: Self-pay | Admitting: Internal Medicine

## 2023-09-29 DIAGNOSIS — R194 Change in bowel habit: Secondary | ICD-10-CM | POA: Diagnosis not present

## 2023-10-02 ENCOUNTER — Ambulatory Visit: Payer: Medicare HMO | Attending: Physician Assistant | Admitting: Physician Assistant

## 2023-10-02 ENCOUNTER — Encounter: Payer: Self-pay | Admitting: Physician Assistant

## 2023-10-02 VITALS — BP 148/88 | HR 75 | Ht 65.0 in | Wt 118.4 lb

## 2023-10-02 DIAGNOSIS — I10 Essential (primary) hypertension: Secondary | ICD-10-CM | POA: Diagnosis not present

## 2023-10-02 DIAGNOSIS — I480 Paroxysmal atrial fibrillation: Secondary | ICD-10-CM | POA: Diagnosis not present

## 2023-10-02 NOTE — Progress Notes (Signed)
 Cardiology Office Note:  .   Date:  10/02/2023  ID:  Stacy [name], DOB [birth year], MRN [id number] PCP: Stacy Reas, PA-C  [facility] Providers Cardiologist:  Stacy Nose, MD     History of Present Illness: Stacy [name] Kitchen   Stacy [name] is a 83 y.o. female with PMH of PAF, carotid artery disease, hypertension, hyperlipidemia, hypothyroidism, and vitamin D deficiency.  Last echocardiogram obtained in May 23, 2013 showed EF 65 to 70%, no regional wall motion abnormality.  Carotid Doppler in October 2020 showed mild bilateral ICA disease.  Patient was last seen by Stacy [name] in October 2024 at which time she was having frequent urinary tract infection.  Patient presents today for follow-up.  She denies any recent chest pain or shortness of breath.  She had an episode last night where she felt anxious and had a some palpitation.  However when EMS arrived, she was actually holding sinus rhythm.  The primary question she had today was actually related to the significant bruising she had in her right forearm.  I suspect she had a small vein that ruptured in the right forearm, she had quite a bit of bruising that occurred last Friday.  Now it is actually healing.  She questioned whether or not she need to continue on the Eliquis.  She says she only ever had 2 episode of A-fib, one was in 2014 and the last one was in July 2024 when she was dehydrated and was having severe diarrhea.  Her CHA2DS2-VASc was 25, age, female and hypertension.  Her annual stroke risk is about 4%.  She is aware that coming off of Eliquis, she will have slightly higher chance of stroke on a annual basis.` I discussed the case with Dr. Rennis [name], and spoke with the patient further over the phone.  Currently she is on Eliquis twice a day and takes aspirin 3 times a day.  I recommended stopping the aspirin.  I discussed with her possibility of considering Watchman device to help lower the stroke risk.  If she continue to  have significant bruising in the future, the patient can decide to stop the Eliquis completely, however she has to except high risk of stroke.  ROS:   She denies chest pain, palpitations, dyspnea, pnd, orthopnea, n, v, dizziness, syncope, edema, weight gain, or early satiety. All other systems reviewed and are otherwise negative except as noted above.   She does complain of bruising in her forearm  Studies Reviewed: .            Risk Assessment/Calculations:            Physical Exam:   VS:  BP (!) 148/88 (BP Location: Left Arm, Patient Position: Sitting)   Pulse 75   Ht 5\' 5"  (1.651 m)   Wt 118 lb 6.4 oz (53.7 kg)   SpO2 94%   BMI 19.70 kg/m    Wt Readings from Last 3 Encounters:  10/02/23 118 lb 6.4 oz (53.7 kg)  09/25/23 117 lb 12.8 oz (53.4 kg)  05/15/23 117 lb (53.1 kg)    GEN: Well nourished, well developed in no acute distress NECK: No JVD; No carotid bruits CARDIAC: RRR, no murmurs, rubs, gallops RESPIRATORY:  Clear to auscultation without rales, wheezing or rhonchi  ABDOMEN: Soft, non-tender, non-distended EXTREMITIES:  No edema; No deformity   ASSESSMENT AND PLAN: .    PAF: EMS was called her last night due to episode of palpitation which she felt may be related to  anxiety.  EMS strip was available today which showed sinus rhythm.  She really has not had any recurrence of atrial fibrillation.  She says she had the 2 episode of symptomatic A-fib, one was in 2014, last episode was in July 2024 while she had a nausea vomiting.  She has not had any symptomatic episodes since.  Primary concern she has today is right forearm bruising which started last Friday.  Initially, she was thinking about potentially coming off of Eliquis, however she also mention she is taking aspirin 3 times a week as well.  I urged her to stop the aspirin.  As for Eliquis, she will continue on the Eliquis at this time.  We discussed possibility of Watchman device in the future as well.  If bruising  continue to persist, patient will consider possibility of coming off of Eliquis at that tolerated high risk of stroke.  I explained to her that her annual stroke risk is 4% given CHA2DS-Vasc score of 4.       Dispo: Follow-up with Dr. Rennis [name] as previously scheduled  Signed, Stacy Course, PA

## 2023-10-02 NOTE — Patient Instructions (Addendum)
 Medication Instructions:  NO CHANGES *If you need a refill on your cardiac medications before your next appointment, please call your pharmacy*   Lab Work: NO LABS If you have labs (blood work) drawn today and your tests are completely normal, you will receive your results only by: MyChart Message (if you have MyChart) OR A paper copy in the mail If you have any lab test that is abnormal or we need to change your treatment, we will call you to review the results.   Testing/Procedures: NO TESTING   Follow-Up: At [facility], you and your health needs are our priority.  As part of our continuing mission to provide you with exceptional heart care, we have created designated Provider Care Teams.  These Care Teams include your primary Cardiologist (physician) and Advanced Practice Providers (APPs -  Physician Assistants and Nurse Practitioners) who all work together to provide you with the care you need, when you need it.   Your next appointment:   KEEP FOLLOW UP Dec 10 2023  Provider:   Chrystie Nose, MD   Other Instructions

## 2023-10-03 ENCOUNTER — Encounter: Payer: Self-pay | Admitting: Internal Medicine

## 2023-10-05 ENCOUNTER — Encounter: Payer: Self-pay | Admitting: Internal Medicine

## 2023-10-08 ENCOUNTER — Encounter: Payer: Self-pay | Admitting: Internal Medicine

## 2023-10-13 DIAGNOSIS — L570 Actinic keratosis: Secondary | ICD-10-CM | POA: Diagnosis not present

## 2023-10-13 DIAGNOSIS — D1801 Hemangioma of skin and subcutaneous tissue: Secondary | ICD-10-CM | POA: Diagnosis not present

## 2023-10-13 DIAGNOSIS — L814 Other melanin hyperpigmentation: Secondary | ICD-10-CM | POA: Diagnosis not present

## 2023-10-21 DIAGNOSIS — R194 Change in bowel habit: Secondary | ICD-10-CM | POA: Diagnosis not present

## 2023-11-10 DIAGNOSIS — L814 Other melanin hyperpigmentation: Secondary | ICD-10-CM | POA: Diagnosis not present

## 2023-11-10 DIAGNOSIS — L570 Actinic keratosis: Secondary | ICD-10-CM | POA: Diagnosis not present

## 2023-11-10 DIAGNOSIS — D1801 Hemangioma of skin and subcutaneous tissue: Secondary | ICD-10-CM | POA: Diagnosis not present

## 2023-12-04 DIAGNOSIS — I10 Essential (primary) hypertension: Secondary | ICD-10-CM | POA: Diagnosis not present

## 2023-12-04 DIAGNOSIS — F339 Major depressive disorder, recurrent, unspecified: Secondary | ICD-10-CM | POA: Diagnosis not present

## 2023-12-04 DIAGNOSIS — Z9181 History of falling: Secondary | ICD-10-CM | POA: Diagnosis not present

## 2023-12-04 DIAGNOSIS — Z79899 Other long term (current) drug therapy: Secondary | ICD-10-CM | POA: Diagnosis not present

## 2023-12-04 DIAGNOSIS — I444 Left anterior fascicular block: Secondary | ICD-10-CM | POA: Diagnosis not present

## 2023-12-04 DIAGNOSIS — Z131 Encounter for screening for diabetes mellitus: Secondary | ICD-10-CM | POA: Diagnosis not present

## 2023-12-04 DIAGNOSIS — F5101 Primary insomnia: Secondary | ICD-10-CM | POA: Diagnosis not present

## 2023-12-04 DIAGNOSIS — E038 Other specified hypothyroidism: Secondary | ICD-10-CM | POA: Diagnosis not present

## 2023-12-04 DIAGNOSIS — D539 Nutritional anemia, unspecified: Secondary | ICD-10-CM | POA: Diagnosis not present

## 2023-12-04 DIAGNOSIS — E7800 Pure hypercholesterolemia, unspecified: Secondary | ICD-10-CM | POA: Diagnosis not present

## 2023-12-07 ENCOUNTER — Encounter: Payer: Self-pay | Admitting: Internal Medicine

## 2023-12-10 ENCOUNTER — Ambulatory Visit: Payer: Medicare HMO | Attending: Internal Medicine | Admitting: Internal Medicine

## 2023-12-10 ENCOUNTER — Encounter: Payer: Self-pay | Admitting: Internal Medicine

## 2023-12-10 VITALS — BP 160/82 | HR 85 | Ht 65.0 in | Wt 119.0 lb

## 2023-12-10 DIAGNOSIS — I480 Paroxysmal atrial fibrillation: Secondary | ICD-10-CM | POA: Diagnosis not present

## 2023-12-10 DIAGNOSIS — I10 Essential (primary) hypertension: Secondary | ICD-10-CM

## 2023-12-10 DIAGNOSIS — I6523 Occlusion and stenosis of bilateral carotid arteries: Secondary | ICD-10-CM

## 2023-12-10 DIAGNOSIS — Z7901 Long term (current) use of anticoagulants: Secondary | ICD-10-CM | POA: Diagnosis not present

## 2023-12-10 NOTE — Progress Notes (Signed)
 OFFICE NOTE  Chief Complaint:  Follow-up  Primary Care Physician: Stacy Commons, PA-C  HPI:  Stacy [name] is a 83 y.o. female with a past medial history significant for a single episode of paroxysmal atrial fibrillation without recurrence (not anticoagulated), send mild carotid artery stenosis, diagnosis of hypertension and hypothyroidism.  She has been maintained on diltiazem  and irbesartan  with good blood pressure control.  She has been also noted to have mild aortic insufficiency in the past by echo.  Initial blood pressure today was a little elevated however improved to 130/70 on recheck.  Lipids in October showed total cholesterol 238, HDL 94, triglycerides 64 and LDL 133.  EKG today showed normal sinus rhythm at 94 with left anterior fascicular block.  She resides at friend's home.  Unfortunately she had a recent fall and fractured her patella.  She had to have surgery and will be rehabbing for a while.  01/20/2023  [name] returns today for follow-up.  Overall she says she has been feeling pretty well.  She denies any recurrent atrial fibrillation.  She is in sinus rhythm today by EKG.  She had lab work in October which showed total cholesterol 238, HDL 94, triglycerides 64 and LDL 133.  Blood pressure was elevated today at 189/97.  12/10/2023  [name] is seen today in follow-up.  She has had some recurrent atrial fibrillation although she reports only brief episodes lasting as much is 15 minutes.  Because she has had several of these she was seen by Ervin Heath, PA-C who felt that with her CHA2DS2-VASc risk score of 4 that anticoagulation was appropriate.  She was started on Eliquis 2.5 mg twice daily and aspirin was stopped.  Although she is taken the medicine she has concerns about it.  She said she had spontaneous hematoma develop in her right forearm.  In addition, the cost of the medication is very high and she is inquiring about the Watchman procedure to try to  eliminate this medicine.  Blood pressure continues to be a challenge.  Initial blood pressure in the office was 180/106 but did come down to 160 systolic.  This is similar to her blood pressure readings last year.  She typically rushes this off saying that is related to not sleeping well or GI upset which she has been having for years.  She feels her A-fib is different than other people's A-fib because it only seems to be associated with stressors on the body.  PMHx:  Past Medical History:  Diagnosis Date   Atrial fibrillation (HCC)    echo 12/23/10 EF= >55%, stress myoview 01/30/11 normal pattern of perfusion in all myocardial regions   Carotid artery stenosis    Per [facility] new patient packet    Chicken pox    Colon polyp    Colon polyp    Dyslipidemia    Heart murmur    Hiatal hernia    Hypertension    Hypothyroidism    Laryngopharyngeal reflux    Osteopenia    Scoliosis    Seasonal allergies    Thyroid  disease    Vitamin D  deficiency     Past Surgical History:  Procedure Laterality Date   BREAST CYST EXCISION Right 1988   ESOPHAGEAL MANOMETRY N/A 08/29/2015   Procedure: ESOPHAGEAL MANOMETRY (EM);  Surgeon: Danette Duos, MD;  Location: [facility];  Service: Gastroenterology;  Laterality: N/A;   EYE SURGERY     Cataract eye surgery-right 06/2014, left 04/2014   ORIF PATELLA  Right 07/08/2022   Procedure: OPEN REDUCTION INTERNAL FIXATION (ORIF) PATELLA;  Surgeon: Osa Blase, MD;  Location: [facility];  Service: Orthopedics;  Laterality: Right;   TONSILLECTOMY     Childhood    FAMHx:  Family History  Problem Relation Age of Onset   CVA Mother    Hyperlipidemia Mother    Hypertension Mother    Anuerysm Father        brain   Stroke Maternal Grandmother    Hypertension Maternal Grandmother    Hypertension Sister    Scoliosis Sister    Colon cancer Neg Hx    Heart attack Neg Hx    Breast cancer Neg Hx     SOCHx:   reports that she has never  smoked. She has never used smokeless tobacco. She reports that she does not currently use alcohol after a past usage of about 1.0 standard drink of alcohol per week. She reports that she does not use drugs.  ALLERGIES:  Allergies  Allergen Reactions   Amoxicillin Swelling   Flonase [Fluticasone Propionate] Swelling    Swelling of throat per patient    Sulfa Antibiotics Other (See Comments)    Upset stomach   Sulfasalazine Other (See Comments)    Upset stomach    ROS: Pertinent items noted in HPI and remainder of comprehensive ROS otherwise negative.  HOME MEDS: Current Outpatient Medications on File Prior to Visit  Medication Sig Dispense Refill   Cholecalciferol (VITAMIN D -3) 1000 UNITS CAPS Take 1,000 Units by mouth 2 (two) times daily.      diltiazem  (CARTIA  XT) 180 MG 24 hr capsule Take 1 capsule (180 mg total) by mouth at bedtime. (Patient taking differently: Take 180 mg by mouth every morning.) 90 capsule 3   ELIQUIS 2.5 MG TABS tablet Take 2.5 mg by mouth 2 (two) times daily.     levothyroxine  (SYNTHROID ) 125 MCG tablet TAKE 1 TABLET BY MOUTH DAILY BEFORE BREAKFAST. 90 tablet 1   LORazepam  (ATIVAN ) 0.5 MG tablet Take 1 tablet (0.5 mg total) by mouth 2 (two) times daily as needed for anxiety. 30 tablet 2   Multiple Vitamin (MULTIVITAMIN) capsule Take 1 capsule by mouth daily.     Multiple Vitamins-Minerals (PRESERVISION AREDS PO) Take 1 tablet by mouth daily.     No current facility-administered medications on file prior to visit.    LABS/IMAGING: No results found for this or any previous visit (from the past 48 hours). No results found.  LIPID PANEL:    Component Value Date/Time   CHOL 272 (H) 03/12/2020 1424   TRIG 64 03/12/2020 1424   HDL 83 03/12/2020 1424   CHOLHDL 3.3 03/12/2020 1424   VLDL 13.2 12/06/2018 0937   LDLCALC 173 (H) 03/12/2020 1424   LDLDIRECT 148.1 08/17/2013 1143     WEIGHTS: Wt Readings from Last 3 Encounters:  12/10/23 119 lb (54 kg)   10/02/23 118 lb 6.4 oz (53.7 kg)  09/25/23 117 lb 12.8 oz (53.4 kg)    VITALS: BP (!) 180/106 (BP Location: Right Arm, Patient Position: Sitting, Cuff Size: Small)   Pulse 85   Ht 5\' 5"  (1.651 m)   Wt 119 lb (54 kg)   SpO2 96%   BMI 19.80 kg/m   EXAM: General appearance: alert and no distress Neck: no carotid bruit, no JVD, and thyroid  not enlarged, symmetric, no tenderness/mass/nodules Lungs: clear to auscultation bilaterally Heart: regular rate and rhythm, S1, S2 normal, and diastolic murmur: early diastolic 2/6, blowing at 2nd  right intercostal space Abdomen: soft, non-tender; bowel sounds normal; no masses,  no organomegaly Extremities: extremities normal, atraumatic, no cyanosis or edema Pulses: 2+ and symmetric Skin: Skin color, texture, turgor normal. No rashes or lesions Neurologic: Grossly normal Psych: Pleasant  EKG: EKG Interpretation Date/Time:  Thursday Dec 10 2023 11:01:38 EDT Ventricular Rate:  81 PR Interval:  188 QRS Duration:  90 QT Interval:  402 QTC Calculation: 466 R Axis:   -51  Text Interpretation: Sinus rhythm with Premature atrial complexes Left anterior fascicular block When compared with ECG of 04-Apr-2023 15:23, Premature atrial complexes are now Present QT has lengthened Confirmed by Dinah Franco [phone]) on 12/10/2023 11:20:54 AM    ASSESSMENT: Paroxysmal atrial fibrillation, CHA2DS2-VASc score of 4 on Eliquis 2.5 mg (age, weight less than 60 kg) Hypertension, uncontrolled Mild aortic insufficiency Hypothyroidism Bilateral carotid artery stenosis  PLAN: 1.   Ms. Forness has concerns about being on Eliquis mostly due to cost but did have a hematoma on her right forearm which was spontaneous and uncomfortable.  She is interested in [place].  I will refer to Dr. Marven Slimmer for further discussion.  Blood pressure continues to be poorly controlled.  Whenever we have discussed it over the past several visits there is always some explanation as to  why it may be elevated but she says is generally better.  I do feel like she is undertreated and asked her to get home blood pressure readings.  We may need to consider 24-hour ambulatory blood pressure monitor and consider further treatments.  Currently she is only on diltiazem .  Follow-up with APP in 1 year.  I have seen Zyleigh Zeitlin is a 83 y.o. female in the office today who is being considered for a Watchman left atrial appendage closure device.  She has a history of paroxysmal atrial fibrillation.  This patients CHA2DS2-VASc Score and unadjusted Ischemic Stroke Rate (% per year) is equal to 4.8 % stroke rate/year from a score of 4 which necessitates long term oral anticoagulation to prevent stroke.  Unfortunately, She is not felt to be a long term anticoagulation candidate secondary to bleeding, cost of medication.  The patients chart has been reviewed and I feel that they would be a candidate for short term oral anticoagulation.  Procedural risks for the Watchman implant have been reviewed with the patient including a 1% risk of stroke, 2% risk of perforation, 0.1% risk of device embolization.  Given the patient's poor candidacy for long-term oral anticoagulation and ability to tolerate short term oral anticoagulation I have recommended the watchman left atrial appendage closure system.   Hazle Lites, MD, [facility], FNLA, FACP  [place]  [facility]  Medical Director of the Advanced Lipid Disorders &  Cardiovascular Risk Reduction Clinic Diplomate of the American Board of Clinical Lipidology Attending Cardiologist  Direct Dial: [phone]  Fax: [phone]  Website:  [email or web address] Steven Basso 12/10/2023, 11:21 AM

## 2023-12-10 NOTE — Patient Instructions (Addendum)
 Medication Instructions:  NO CHANGES  *If you need a refill on your cardiac medications before your next appointment, please call your pharmacy*   Follow-Up: At [facility], you and your health needs are our priority.  As part of our continuing mission to provide you with exceptional heart care, our providers are all part of one team.  This team includes your primary Cardiologist (physician) and Advanced Practice Providers or APPs (Physician Assistants and Nurse Practitioners) who all work together to provide you with the care you need, when you need it.  Your next appointment:    12 months with Hao Meng, PA  We recommend signing up for the patient portal called "MyChart".  Sign up information is provided on this After Visit Summary.  MyChart is used to connect with patients for Virtual Visits (Telemedicine).  Patients are able to view lab/test results, encounter notes, upcoming appointments, etc.  Non-urgent messages can be sent to your provider as well.   To learn more about what you can do with MyChart, go to [email or web address].   Other Instructions You have been referred to Dr. Harvie Liner - cardiac electrophysiologist to discuss Watchman  Please have nurse at [facility] check your BP daily or at least several times a week. After 2 weeks of readings, send message in MyChart with update.

## 2023-12-15 DIAGNOSIS — D1801 Hemangioma of skin and subcutaneous tissue: Secondary | ICD-10-CM | POA: Diagnosis not present

## 2023-12-15 DIAGNOSIS — L814 Other melanin hyperpigmentation: Secondary | ICD-10-CM | POA: Diagnosis not present

## 2023-12-15 DIAGNOSIS — L570 Actinic keratosis: Secondary | ICD-10-CM | POA: Diagnosis not present

## 2023-12-18 DIAGNOSIS — R194 Change in bowel habit: Secondary | ICD-10-CM | POA: Diagnosis not present

## 2023-12-18 DIAGNOSIS — I444 Left anterior fascicular block: Secondary | ICD-10-CM | POA: Diagnosis not present

## 2023-12-18 DIAGNOSIS — F339 Major depressive disorder, recurrent, unspecified: Secondary | ICD-10-CM | POA: Diagnosis not present

## 2023-12-18 DIAGNOSIS — F5101 Primary insomnia: Secondary | ICD-10-CM | POA: Diagnosis not present

## 2023-12-18 DIAGNOSIS — Z9181 History of falling: Secondary | ICD-10-CM | POA: Diagnosis not present

## 2023-12-18 DIAGNOSIS — I10 Essential (primary) hypertension: Secondary | ICD-10-CM | POA: Diagnosis not present

## 2023-12-21 ENCOUNTER — Telehealth: Payer: Self-pay | Admitting: Internal Medicine

## 2023-12-21 NOTE — Telephone Encounter (Signed)
 Patient is calling to say that her PCP did bloodwork and her Free T4 is abnormal.  Patient is calling to find out what she needs to do.  Her level is 1.87.

## 2023-12-21 NOTE — Telephone Encounter (Signed)
 I called and spoke with the patient to see if other labs were done like TSH or Free T3 and she stated to me that her T3 results were:60.48 and T$4:1.87. She is currently taking 125 mch daily of Levothyroxine . Please advise in Dr. Aldona Amel absence.

## 2023-12-21 NOTE — Telephone Encounter (Signed)
 A letter has been sent to the PCP to send over the lab results.

## 2023-12-22 NOTE — Telephone Encounter (Signed)
 I have routed the results to you. They are also under the Media

## 2023-12-22 NOTE — Telephone Encounter (Signed)
Unable to leave a VM, will try again later.

## 2023-12-22 NOTE — Telephone Encounter (Signed)
 Pt has been notified and voices understanding.

## 2023-12-29 NOTE — Progress Notes (Unsigned)
 Electrophysiology Office Note:    Date:  12/30/2023   ID:  Stacy [name], DOB May 15, 1941, MRN [id number]  [facility] Cardiologist:  Hazle Lites, MD  [facility] Electrophysiologist:  [name], MD   Referring MD: Hazle Lites, MD   Chief Complaint: Atrial fibrillation  History of Present Illness:    Stacy [name] is an 83 year old woman who I am seeing today for an evaluation of atrial fibrillation at the request of Dr. Maximo [name].  The patient has a history of paroxysmal atrial fibrillation, mild carotid artery stenosis, hypertension and hypothyroidism.  The patient was started on Eliquis recently.  She did develop a spontaneous hematoma in her right forearm.  She is interested in avoiding long-term anticoagulation given risks of bleeding and excessive cost associate with the medication.  Today she is doing well.  She initially tells me she has only had 1 episode of atrial fibrillation that was years ago in the setting of emotional trauma.  In reviewing her record she did have another episode last year that was relatively asymptomatic.  With the asymptomatic nature of the episode, it is unclear how much A-fib she actually is having.  She does take her Eliquis but does not like paying the $150 per month for the prescription.     Their past medical, social and family history was reviewed.   ROS:   Please see the history of present illness.    All other systems reviewed and are negative.  EKGs/Labs/Other Studies Reviewed:    The following studies were reviewed today:  Dec 10, 2023 EKG shows sinus rhythm  February 21, 2023 EKG shows atrial fibrillation with a rapid ventricular rate.       Physical Exam:    VS:  BP (!) 144/80 (BP Location: Left Arm, Patient Position: Sitting, Cuff Size: Normal)   Pulse 82   Ht 5\' 5"  (1.651 m)   Wt 119 lb (54 kg)   SpO2 97%   BMI 19.80 kg/m     Wt Readings from Last 3 Encounters:  12/30/23 119 lb (54 kg)  12/10/23 119 lb (54  kg)  10/02/23 118 lb 6.4 oz (53.7 kg)     GEN: no distress.  Elderly CARD: RRR, No MRG RESP: No IWOB. CTAB.        ASSESSMENT AND PLAN:    1. Paroxysmal atrial fibrillation (HCC)   2. Essential hypertension     #Atrial fibrillation Very long discussion today about the patient's history of atrial fibrillation.  Much of the time was spent showing her evidence that she does have atrial fibrillation.  We discussed the rationale for anticoagulation.  I emphasized the importance of being protected against stroke.  We discussed that being on anticoagulation is a very reasonable strategy to achieve this.  We discussed the possibility of using watchman as an alternative only if she is interested in avoiding long-term exposure anticoagulation.  I have seen Stacy [name] in the office today who is being considered for a Watchman left atrial appendage closure device. I believe they will benefit from this procedure given their history of atrial fibrillation, CHA2DS2-VASc score of 4 and unadjusted ischemic stroke rate of 4.8% per year. Unfortunately, the patient is not felt to be a long term anticoagulation candidate secondary to history of spontaneous hematoma, history of falls. The patient's chart has been reviewed and I feel that they would be a candidate for short term oral anticoagulation after Watchman implant.   It is my belief that after  undergoing a LAA closure procedure, Stacy [name] will not need long term anticoagulation which eliminates anticoagulation side effects and major bleeding risk.   Procedural risks for the Watchman implant have been reviewed with the patient including a 0.5% risk of stroke, <1% risk of perforation and <1% risk of device embolization. Other risks include bleeding, vascular damage, tamponade, worsening renal function, and death.   The published clinical data on the safety and effectiveness of WATCHMAN include but are not limited to the following: - Holmes  DR, Evalene Hilda, Sick P et al. for the PROTECT AF Investigators. Percutaneous closure of the left atrial appendage versus warfarin therapy for prevention of stroke in patients with atrial fibrillation: a randomised non-inferiority trial. Lancet 2009; 374: 534-42. Evalene Hilda, Doshi SK, Deloria Fetch D et al. on behalf of the PROTECT AF Investigators. Percutaneous Left Atrial Appendage Closure for Stroke Prophylaxis in Patients With Atrial Fibrillation 2.3-Year Follow-up of the PROTECT AF (Watchman Left Atrial Appendage System for Embolic Protection in Patients With Atrial Fibrillation) Trial. Circulation 2013; 127:720-729. - Alli O, Doshi S,  Kar S, Reddy VY, Sievert H et al. Quality of Life Assessment in the Randomized PROTECT AF (Percutaneous Closure of the Left Atrial Appendage Versus Warfarin Therapy for Prevention of Stroke in Patients With Atrial Fibrillation) Trial of Patients at Risk for Stroke With Nonvalvular Atrial Fibrillation. J Am Coll Cardiol 2013; 61:1790-8. Bartholome Ligas DR, Mario Sicard, Price M, Whisenant B, Sievert H, Doshi S, Huber K, Reddy V. Prospective randomized evaluation of the Watchman left atrial appendage Device in patients with atrial fibrillation versus long-term warfarin therapy; the PREVAIL trial. Journal of the [employer] of Cardiology, Vol. 4, No. 1, 2014, 1-11. - Kar S, Doshi SK, Sadhu A, Horton R, Osorio J et al. Primary outcome evaluation of a next-generation left atrial appendage closure device: results from the PINNACLE FLX trial. Circulation 2021;143(18)1754-1762.    Additionally, the patient will need an echocardiogram.  HAS-BLED score 3 Hypertension Yes  Abnormal renal and liver function (Dialysis, transplant, Cr >2.26 mg/dL /Cirrhosis or Bilirubin >2x Normal or AST/ALT/AP >3x Normal) No  Stroke No  Bleeding Yes  Labile INR (Unstable/high INR) No  Elderly (>65) Yes  Drugs or alcohol (>= 8 drinks/week, anti-plt or NSAID) No   CHA2DS2-VASc Score = 4  The  patient's score is based upon: CHF History: 0 HTN History: 0 Diabetes History: 0 Stroke History: 0 Vascular Disease History: 1 Age Score: 2 Gender Score: 1  #Hypertension Above goal today.  Recommend checking blood pressures 1-2 times per week at home and recording the values.  Recommend bringing these recordings to the primary care physician.  She will let us  know if she wants to proceed with watchman evaluation.  Signed, Leanora Prophet. Marven Slimmer, MD, [facility], [facility] 12/30/2023 11:36 AM    Electrophysiology [facility]

## 2023-12-30 ENCOUNTER — Encounter: Payer: Self-pay | Admitting: Cardiology

## 2023-12-30 ENCOUNTER — Ambulatory Visit: Attending: Cardiology | Admitting: Cardiology

## 2023-12-30 VITALS — BP 144/80 | HR 82 | Ht 65.0 in | Wt 119.0 lb

## 2023-12-30 DIAGNOSIS — I10 Essential (primary) hypertension: Secondary | ICD-10-CM | POA: Diagnosis not present

## 2023-12-30 DIAGNOSIS — I480 Paroxysmal atrial fibrillation: Secondary | ICD-10-CM

## 2023-12-30 NOTE — Patient Instructions (Addendum)
 Medication Instructions:  Your physician recommends that you continue on your current medications as directed. Please refer to the Current Medication list given to you today.  *If you need a refill on your cardiac medications before your next appointment, please call your pharmacy*  Testing/Procedures: Watchman  Your physician has requested that you have Left atrial appendage (LAA) closure device implantation is a procedure to put a small device in the LAA of the heart. The LAA is a small sac in the wall of the heart's left upper chamber. Blood clots can form in this area. The device, Watchman closes the LAA to help prevent a blood clot and stroke.   Follow-Up: At [facility], you and your health needs are our priority.  As part of our continuing mission to provide you with exceptional heart care, our providers are all part of one team.  This team includes your primary Cardiologist (physician) and Advanced Practice Providers or APPs (Physician Assistants and Nurse Practitioners) who all work together to provide you with the care you need, when you need it.  Please contact Nurse Navigator, Kat[place] at [phone] if you decide that you would like to schedule the Watchman procedure.

## 2024-01-02 ENCOUNTER — Other Ambulatory Visit: Payer: Self-pay

## 2024-01-02 ENCOUNTER — Observation Stay (HOSPITAL_COMMUNITY)
Admission: EM | Admit: 2024-01-02 | Discharge: 2024-01-03 | Disposition: A | Discharge status: Home / Self Care | Attending: Student | Admitting: Student

## 2024-01-02 ENCOUNTER — Emergency Department (HOSPITAL_COMMUNITY)

## 2024-01-02 ENCOUNTER — Encounter (HOSPITAL_COMMUNITY): Payer: Self-pay

## 2024-01-02 DIAGNOSIS — E039 Hypothyroidism, unspecified: Secondary | ICD-10-CM | POA: Insufficient documentation

## 2024-01-02 DIAGNOSIS — Z7901 Long term (current) use of anticoagulants: Secondary | ICD-10-CM | POA: Insufficient documentation

## 2024-01-02 DIAGNOSIS — K5900 Constipation, unspecified: Secondary | ICD-10-CM | POA: Diagnosis not present

## 2024-01-02 DIAGNOSIS — I10 Essential (primary) hypertension: Secondary | ICD-10-CM | POA: Diagnosis not present

## 2024-01-02 DIAGNOSIS — I480 Paroxysmal atrial fibrillation: Secondary | ICD-10-CM | POA: Diagnosis not present

## 2024-01-02 DIAGNOSIS — E871 Hypo-osmolality and hyponatremia: Secondary | ICD-10-CM | POA: Diagnosis not present

## 2024-01-02 DIAGNOSIS — R778 Other specified abnormalities of plasma proteins: Secondary | ICD-10-CM | POA: Diagnosis not present

## 2024-01-02 DIAGNOSIS — E559 Vitamin D deficiency, unspecified: Secondary | ICD-10-CM | POA: Diagnosis present

## 2024-01-02 DIAGNOSIS — F419 Anxiety disorder, unspecified: Secondary | ICD-10-CM | POA: Diagnosis not present

## 2024-01-02 DIAGNOSIS — R42 Dizziness and giddiness: Secondary | ICD-10-CM

## 2024-01-02 DIAGNOSIS — Z79899 Other long term (current) drug therapy: Secondary | ICD-10-CM | POA: Diagnosis not present

## 2024-01-02 DIAGNOSIS — K449 Diaphragmatic hernia without obstruction or gangrene: Secondary | ICD-10-CM | POA: Diagnosis not present

## 2024-01-02 DIAGNOSIS — K7689 Other specified diseases of liver: Secondary | ICD-10-CM | POA: Diagnosis not present

## 2024-01-02 DIAGNOSIS — R002 Palpitations: Secondary | ICD-10-CM | POA: Diagnosis not present

## 2024-01-02 DIAGNOSIS — R109 Unspecified abdominal pain: Secondary | ICD-10-CM | POA: Diagnosis not present

## 2024-01-02 LAB — COMPREHENSIVE METABOLIC PANEL WITH GFR
ALT: 20 U/L (ref 0–44)
AST: 24 U/L (ref 15–41)
Albumin: 3.8 g/dL (ref 3.5–5.0)
Alkaline Phosphatase: 64 U/L (ref 38–126)
Anion gap: 11 (ref 5–15)
BUN: 17 mg/dL (ref 8–23)
CO2: 25 mmol/L (ref 22–32)
Calcium: 9.7 mg/dL (ref 8.9–10.3)
Chloride: 91 mmol/L — ABNORMAL LOW (ref 98–111)
Creatinine, Ser: 0.8 mg/dL (ref 0.44–1.00)
GFR, Estimated: >60 mL/min (ref >60)
Glucose, Bld: 129 mg/dL — ABNORMAL HIGH (ref 70–99)
Potassium: 3.7 mmol/L (ref 3.5–5.1)
Sodium: 127 mmol/L — ABNORMAL LOW (ref 135–145)
Total Bilirubin: 0.9 mg/dL (ref 0.0–1.2)
Total Protein: 6.2 g/dL — ABNORMAL LOW (ref 6.5–8.1)

## 2024-01-02 LAB — URINALYSIS, ROUTINE W REFLEX MICROSCOPIC
Bacteria, UA: NONE SEEN
Bilirubin Urine: NEGATIVE
Glucose, UA: NEGATIVE mg/dL
Hgb urine dipstick: NEGATIVE
Ketones, ur: NEGATIVE mg/dL
Leukocytes,Ua: NEGATIVE
Nitrite: POSITIVE — AB
Protein, ur: NEGATIVE mg/dL
Specific Gravity, Urine: 1.02 (ref 1.005–1.030)
pH: 6 (ref 5.0–8.0)

## 2024-01-02 LAB — CBC
HCT: 40.9 % (ref 36.0–46.0)
Hemoglobin: 14.3 g/dL (ref 12.0–15.0)
MCH: 32.9 pg (ref 26.0–34.0)
MCHC: 35 g/dL (ref 30.0–36.0)
MCV: 94.2 fL (ref 80.0–100.0)
Platelets: 147 10*3/uL — ABNORMAL LOW (ref 150–400)
RBC: 4.34 MIL/uL (ref 3.87–5.11)
RDW: 12.8 % (ref 11.5–15.5)
WBC: 5.9 10*3/uL (ref 4.0–10.5)
nRBC: 0 % (ref 0.0–0.2)

## 2024-01-02 LAB — TSH: TSH: 6.149 u[IU]/mL — ABNORMAL HIGH (ref 0.350–4.500)

## 2024-01-02 LAB — CBG MONITORING, ED: Glucose-Capillary: 110 mg/dL — ABNORMAL HIGH (ref 70–99)

## 2024-01-02 LAB — MRSA NEXT GEN BY PCR, NASAL: MRSA by PCR Next Gen: NOT DETECTED

## 2024-01-02 LAB — PHOSPHORUS: Phosphorus: 3.1 mg/dL (ref 2.5–4.6)

## 2024-01-02 LAB — TROPONIN I (HIGH SENSITIVITY)
Troponin I (High Sensitivity): 22 ng/L — ABNORMAL HIGH (ref <18)
Troponin I (High Sensitivity): 29 ng/L — ABNORMAL HIGH (ref <18)
Troponin I (High Sensitivity): 72 ng/L — ABNORMAL HIGH (ref <18)

## 2024-01-02 LAB — MAGNESIUM: Magnesium: 1.9 mg/dL (ref 1.7–2.4)

## 2024-01-02 LAB — T4, FREE: Free T4: 1.21 ng/dL — ABNORMAL HIGH (ref 0.61–1.12)

## 2024-01-02 MED ORDER — SODIUM CHLORIDE 0.9 % IV SOLN: INTRAVENOUS | Status: AC

## 2024-01-02 MED ORDER — DILTIAZEM HCL ER COATED BEADS 180 MG PO CP24
180.0000 mg | ORAL_CAPSULE | Freq: Every morning | ORAL | Status: DC
  Administered 2024-01-03: 180 mg via ORAL
  Filled 2024-01-02: qty 1

## 2024-01-02 MED ORDER — IOHEXOL 350 MG/ML SOLN
75.0000 mL | Freq: Once | INTRAVENOUS | Status: AC | PRN
  Administered 2024-01-02: 75 mL via INTRAVENOUS

## 2024-01-02 MED ORDER — ONDANSETRON HCL 4 MG PO TABS: 4.0000 mg | ORAL_TABLET | Freq: Four times a day (QID) | ORAL | Status: DC | PRN

## 2024-01-02 MED ORDER — VITAMIN D 25 MCG (1000 UNIT) PO TABS
1000.0000 [IU] | ORAL_TABLET | Freq: Every morning | ORAL | Status: DC
  Administered 2024-01-03: 1000 [IU] via ORAL
  Filled 2024-01-02: qty 1

## 2024-01-02 MED ORDER — ADULT MULTIVITAMIN W/MINERALS CH
1.0000 | ORAL_TABLET | Freq: Every day | ORAL | Status: DC
  Filled 2024-01-02: qty 1

## 2024-01-02 MED ORDER — ONDANSETRON HCL 4 MG/2ML IJ SOLN: 4.0000 mg | Freq: Four times a day (QID) | INTRAMUSCULAR | Status: DC | PRN

## 2024-01-02 MED ORDER — SODIUM CHLORIDE 0.9 % IV BOLUS
500.0000 mL | Freq: Once | INTRAVENOUS | Status: AC
  Administered 2024-01-02: 500 mL via INTRAVENOUS

## 2024-01-02 MED ORDER — LEVOTHYROXINE SODIUM 25 MCG PO TABS: 125.0000 ug | ORAL_TABLET | ORAL | Status: DC

## 2024-01-02 MED ORDER — ACETAMINOPHEN 325 MG PO TABS: 650.0000 mg | ORAL_TABLET | Freq: Four times a day (QID) | ORAL | Status: DC | PRN

## 2024-01-02 MED ORDER — SENNOSIDES-DOCUSATE SODIUM 8.6-50 MG PO TABS
1.0000 | ORAL_TABLET | Freq: Every evening | ORAL | Status: DC | PRN
Start: 2024-01-02 — End: 2024-01-03

## 2024-01-02 MED ORDER — SODIUM CHLORIDE 0.9 % IV BOLUS: 500.0000 mL | Freq: Once | INTRAVENOUS | Status: DC

## 2024-01-02 MED ORDER — ACETAMINOPHEN 650 MG RE SUPP: 650.0000 mg | Freq: Four times a day (QID) | RECTAL | Status: DC | PRN

## 2024-01-02 MED ORDER — APIXABAN 2.5 MG PO TABS
2.5000 mg | ORAL_TABLET | Freq: Two times a day (BID) | ORAL | Status: DC
  Administered 2024-01-02 – 2024-01-03 (×2): 2.5 mg via ORAL
  Filled 2024-01-02 (×2): qty 1

## 2024-01-02 MED ORDER — LEVOTHYROXINE SODIUM 75 MCG PO TABS
75.0000 ug | ORAL_TABLET | ORAL | Status: DC
  Administered 2024-01-03: 75 ug via ORAL
  Filled 2024-01-02: qty 1

## 2024-01-02 MED ORDER — LEVOTHYROXINE SODIUM 25 MCG PO TABS: 125.0000 ug | ORAL_TABLET | Freq: Every day | ORAL | Status: DC

## 2024-01-02 MED ORDER — LORAZEPAM 0.5 MG PO TABS
0.5000 mg | ORAL_TABLET | Freq: Two times a day (BID) | ORAL | Status: DC | PRN
  Administered 2024-01-02: 0.5 mg via ORAL
  Filled 2024-01-02: qty 1

## 2024-01-02 NOTE — ED Provider Notes (Signed)
 [place] EMERGENCY DEPARTMENT AT [facility] Provider Note   CSN: [id number] Arrival date & time: 01/02/24  1147     History  Chief Complaint  Patient presents with   Dizziness   Palpitations    Stacy [name] is a 83 y.o. female with history of of A-fib on Eliquis and diltiazem , hypertension, anxiety presents with complaints of feeling dizzy, dehydrated and constipated.  Dizziness is described as feeling unbalanced, has been worse in the mornings. When she has taken her A-fib medications and drinking fluids and her symptoms improved.  Not associated with headache, vision changes, weakness.  Notes that she has felt constipated these past few days as well.  Denies any abdominal pain.  No urinary symptoms.  No prior abdominal surgeries.   Dizziness Associated symptoms: palpitations   Palpitations Associated symptoms: dizziness    Past Medical History:  Diagnosis Date   Atrial fibrillation (HCC)    echo 12/23/10 EF= >55%, stress myoview 01/30/11 normal pattern of perfusion in all myocardial regions   Carotid artery stenosis    Per [facility] new patient packet    Chicken pox    Colon polyp    Colon polyp    Dyslipidemia    Heart murmur    Hiatal hernia    Hypertension    Hypothyroidism    Laryngopharyngeal reflux    Osteopenia    Scoliosis    Seasonal allergies    Thyroid  disease    Vitamin D  deficiency        Home Medications Prior to Admission medications   Medication Sig Start Date End Date Taking? Authorizing Provider  Cholecalciferol (VITAMIN D -3) 1000 UNITS CAPS Take 1,000 Units by mouth 2 (two) times daily.     [provider]  diltiazem  (CARTIA  XT) 180 MG 24 hr capsule Take 1 capsule (180 mg total) by mouth at bedtime. Patient taking differently: Take 180 mg by mouth every morning. 03/25/23   Hilty, [name], MD  ELIQUIS 2.5 MG TABS tablet Take 2.5 mg by mouth 2 (two) times daily. 02/26/23   [provider]  levothyroxine  (SYNTHROID )  125 MCG tablet TAKE 1 TABLET BY MOUTH DAILY BEFORE BREAKFAST. Patient taking differently: Take 125 mcg by mouth daily before breakfast. Take 125 mcg Monday-Saturday and 75 mcg on Sunday 09/16/23   Emilie Harden, MD  LORazepam  (ATIVAN ) 0.5 MG tablet Take 1 tablet (0.5 mg total) by mouth 2 (two) times daily as needed for anxiety. 12/13/20   Mast, Man X, NP  Multiple Vitamin (MULTIVITAMIN) capsule Take 1 capsule by mouth daily.    [provider]  Multiple Vitamins-Minerals (PRESERVISION AREDS PO) Take 1 tablet by mouth daily.    [provider]      Allergies    Amoxicillin, Flonase [fluticasone propionate], Sulfa antibiotics, and Sulfasalazine    Review of Systems   Review of Systems  Cardiovascular:  Positive for palpitations.  Neurological:  Positive for dizziness.    Physical Exam Updated Vital Signs BP (!) 157/96   Pulse 66   Temp 98 F (36.7 C) (Oral)   Resp (!) 28   SpO2 100%  Physical Exam Vitals and nursing note reviewed.  Constitutional:      General: She is not in acute distress.    Appearance: She is well-developed.  HENT:     Head: Normocephalic and atraumatic.  Eyes:     Conjunctiva/sclera: Conjunctivae normal.  Cardiovascular:     Rate and Rhythm: Normal rate and regular rhythm.  Heart sounds: No murmur heard. Pulmonary:     Effort: Pulmonary effort is normal. No respiratory distress.     Breath sounds: Normal breath sounds.  Abdominal:     Palpations: Abdomen is soft.     Tenderness: There is no abdominal tenderness.  Musculoskeletal:        General: No swelling.     Cervical back: Neck supple.  Skin:    General: Skin is warm and dry.     Capillary Refill: Capillary refill takes less than 2 seconds.  Neurological:     Mental Status: She is alert.  Psychiatric:        Mood and Affect: Mood normal.     ED Results / Procedures / Treatments   Labs (all labs ordered are listed, but only abnormal results are displayed) Labs  Reviewed  COMPREHENSIVE METABOLIC PANEL WITH GFR - Abnormal; Notable for the following components:      Result Value   Sodium 127 (*)    Chloride 91 (*)    Glucose, Bld 129 (*)    Total Protein 6.2 (*)    All other components within normal limits  CBC - Abnormal; Notable for the following components:   Platelets 147 (*)    All other components within normal limits  URINALYSIS, ROUTINE W REFLEX MICROSCOPIC - Abnormal; Notable for the following components:   Nitrite POSITIVE (*)    All other components within normal limits  CBG MONITORING, ED - Abnormal; Notable for the following components:   Glucose-Capillary 110 (*)    All other components within normal limits  TROPONIN I (HIGH SENSITIVITY) - Abnormal; Notable for the following components:   Troponin I (High Sensitivity) 22 (*)    All other components within normal limits  TROPONIN I (HIGH SENSITIVITY) - Abnormal; Notable for the following components:   Troponin I (High Sensitivity) 29 (*)    All other components within normal limits  TROPONIN I (HIGH SENSITIVITY) - Abnormal; Notable for the following components:   Troponin I (High Sensitivity) 72 (*)    All other components within normal limits    EKG None  Radiology CT ABDOMEN PELVIS W CONTRAST Result Date: 01/02/2024 CLINICAL DATA:  Abdominal pain. Palpitations. Suspected bowel obstruction. EXAM: CT ABDOMEN AND PELVIS WITH CONTRAST TECHNIQUE: Multidetector CT imaging of the abdomen and pelvis was performed using the standard protocol following bolus administration of intravenous contrast. RADIATION DOSE REDUCTION: This exam was performed according to the departmental dose-optimization program which includes automated exposure control, adjustment of the mA and/or kV according to patient size and/or use of iterative reconstruction technique. CONTRAST:  75mL OMNIPAQUE IOHEXOL 350 MG/ML SOLN COMPARISON:  None Available. FINDINGS: Lower Chest: Mild compressive bibasilar atelectasis  and tiny left pleural effusion. Hepatobiliary: No suspicious hepatic masses identified. A few small hepatic cysts are noted. Gallbladder is unremarkable. No evidence of biliary ductal dilatation. Pancreas:  No mass or inflammatory changes. Spleen: Within normal limits in size and appearance. Adrenals/Urinary Tract: No suspicious masses identified. Small benign-appearing bilateral subcapsular renal cysts incidentally noted (No followup imaging is recommended). No evidence of ureteral calculi or hydronephrosis. Unremarkable unopacified urinary bladder. Stomach/Bowel: A large hiatal hernia is seen which contains nearly the entire stomach, transverse colon, and pancreatic tail. No evidence of bowel obstruction, inflammatory process or abnormal fluid collections. Vascular/Lymphatic: No pathologically enlarged lymph nodes. No acute vascular findings. Reproductive:  No mass or other significant abnormality. Other:  None. Musculoskeletal: No suspicious bone lesions identified. Severe thoracolumbar rotatory levoscoliosis noted. IMPRESSION: No  evidence of bowel obstruction or other acute findings. Large hiatal hernia, which contains nearly the entire stomach, transverse colon, and pancreatic tail. Severe thoracolumbar levoscoliosis. Mild bibasilar atelectasis and tiny left pleural effusion. Electronically Signed   By: Marlyce Sine M.D.   On: 01/02/2024 14:30   DG Chest Portable 1 View Result Date: 01/02/2024 CLINICAL DATA:  palpitations EXAM: PORTABLE CHEST 1 VIEW COMPARISON:  April 04, 2023, April 07, 2015 FINDINGS: Evaluation is limited by rotation. Revisualization of a large diaphragmatic hernia. Cardiomediastinal silhouette is grossly unchanged. No pleural effusion or pneumothorax. No acute pleuroparenchymal abnormality. Atherosclerotic calcifications. Severe levo scoliosis of the thoracic spine. IMPRESSION: No acute cardiopulmonary abnormality. Electronically Signed   By: Clancy Crimes M.D.   On: 01/02/2024  13:18    Procedures Procedures    Medications Ordered in ED Medications  iohexol (OMNIPAQUE) 350 MG/ML injection 75 mL (75 mLs Intravenous Contrast Given 01/02/24 1332)  sodium chloride  0.9 % bolus 500 mL (0 mLs Intravenous Stopped 01/02/24 1539)    ED Course/ Medical Decision Making/ A&P                                 Medical Decision Making Amount and/or Complexity of Data Reviewed Labs: ordered. Radiology: ordered.  Risk Prescription drug management. Decision regarding hospitalization.   This patient presents to the ED with chief complaint(s) of dizziness.  The complaint involves an extensive differential diagnosis and also carries with it a high risk of complications and morbidity.   Pertinent past medical history as listed in HPI  The differential diagnosis includes  CVA, TIA, ACS, A-fib, hypoglycemia, small bowel obstruction Additional history obtained: Records reviewed Care Everywhere/External Records  Assessment and management:   Hemodynamically stable, nontoxic-appearing patient presented with complaints of intermittent dizziness over the past few days.  Feels like she has gone in and out of A-fib.  Notes palpitations.  Not associated with chest pain or shortness of breath.  She consumes fluids and takes her diltiazem  and her symptoms improved.  She does also endorse some constipation without abdominal pain.  Has had no prior abdominal surgeries.  No vomiting or diarrhea.  Still passing small bowel movements.  No urinary symptoms.  On exam her lungs are clear, her abdomen is nontender.  Will obtain basic labs as well as CT abdomen pelvis to evaluate for any nidus for her A-fib.  Noted to have a hyponatremia of 127, not far from a baseline of 129.  Unclear whether her dizziness is related to her A-fib or to her hyponatremia.  Will give a half a liter of normal saline.  Her troponin has had some rise from 22-29.  She continues to deny any chest pain.  Independent ECG  interpretation:  Normal sinus rhythm, unchanged  Independent labs interpretation:  The following labs were independently interpreted:  CBC with mild thrombocytopenia of 147, UA positive for nitrites, negative leukocytes, no bacteria, CMP with hyponatremia 127, near baseline at 129,  Independent visualization and interpretation of imaging: I independently visualized the following imaging with scope of interpretation limited to determining acute life threatening conditions related to emergency care:  Chest x-ray no cardiopulmonary disease CT abdomen pelvis no acute abdominal abnormality, large hiatal hernia, mild bibasilar atelectasis and tiny left pleural effusion  Consultations obtained:   Cardiology Dr. Jolan Natal recommending admission for observation overnight, will consult.  Hold off on aspirin or repeating troponin at this time Hospitalist Dr. Yvonne Hering agreed for  admission  Disposition:   Patient will be admitted for observation for elevated troponin  Social Determinants of Health:   none  This note was dictated with voice recognition software.  Despite best efforts at proofreading, errors may have occurred which can change the documentation meaning.          Final Clinical Impression(s) / ED Diagnoses Final diagnoses:  Palpitations  Dizziness  Constipation, unspecified constipation type    Rx / DC Orders ED Discharge Orders     None         Felicie Horning, PA-C 01/02/24 1837    Jerilynn Montenegro, MD 01/03/24 1319

## 2024-01-02 NOTE — ED Notes (Signed)
 This tech asked pt to change into hospital gown, pt stated "she would just wait until she went up".

## 2024-01-02 NOTE — ED Notes (Signed)
Report attempted, RN to call back. 

## 2024-01-02 NOTE — ED Notes (Signed)
 Per Dr. Darcia Easter pt is ok to eat. Food provided per pt request.

## 2024-01-02 NOTE — ED Notes (Signed)
 Meal tray delivery to the pt as she requested and when delivery pt refuses to eat.

## 2024-01-02 NOTE — H&P (Incomplete)
 History and Physical  Stacy [name] CZY:[id number] DOB: Jul 23, 1941 DOA: 01/02/2024  PCP: Chares Commons, PA-C   Chief Complaint: Dizziness  [name] with medical history significant for paroxysmal A-fib on Eliquis, carotid artery stenosis, HLD, HTN, hypothyroidism, and anxiety who presented to the ED for evaluation of dizziness and palpitations.  UA with no signs of infection.  ED Course: Initial vitals shows normal heart rate with HR in the 60s to 80s, SBP 100s to 150s, SpO2 98% on room air.  Labs significant for sodium 127, normal kidney function, Hgb 14.3, WBC 15.9, platelet 147, troponin 22->29->72.  EKG shows normal sinus rhythm LAFB. CXR with no active disease.  CT A/P  shows large hiatal hernia but no acute intra-abdominal pathology.  Received IV NS 500 cc bolus x 1.  Cardiology consulted for evaluation. TRH consulted for admission.  Review of Systems: Please see HPI for pertinent positives and negatives. A complete 10 system review of systems are otherwise negative.  Past Medical History:  Diagnosis Date   Atrial fibrillation (HCC)    echo 12/23/10 EF= >55%, stress myoview 01/30/11 normal pattern of perfusion in all myocardial regions   Carotid artery stenosis    Per [facility] new patient packet    Chicken pox    Colon polyp    Colon polyp    Dyslipidemia    Heart murmur    Hiatal hernia    Hypertension    Hypothyroidism    Laryngopharyngeal reflux    Osteopenia    Scoliosis    Seasonal allergies    Thyroid  disease    Vitamin D  deficiency    Past Surgical History:  Procedure Laterality Date   BREAST CYST EXCISION Right 1988   ESOPHAGEAL MANOMETRY N/A 08/29/2015   Procedure: ESOPHAGEAL MANOMETRY (EM);  Surgeon: Danette Duos, MD;  Location: [facility];  Service: Gastroenterology;  Laterality: N/A;   EYE SURGERY     Cataract eye surgery-right 06/2014, left 04/2014   ORIF PATELLA Right 07/08/2022   Procedure: OPEN REDUCTION  INTERNAL FIXATION (ORIF) PATELLA;  Surgeon: Osa Blase, MD;  Location: [facility];  Service: Orthopedics;  Laterality: Right;   TONSILLECTOMY     Childhood   Social History:  reports that she has never smoked. She has never used smokeless tobacco. She reports that she does not currently use alcohol after a past usage of about 1.0 standard drink of alcohol per week. She reports that she does not use drugs.  Allergies  Allergen Reactions   Amoxicillin Swelling   Flonase [Fluticasone Propionate] Swelling    Swelling of throat per patient    Sulfa Antibiotics Other (See Comments)    Upset stomach   Sulfasalazine Other (See Comments)    Upset stomach    Family History  Problem Relation Age of Onset   CVA Mother    Hyperlipidemia Mother    Hypertension Mother    Anuerysm Father        brain   Stroke Maternal Grandmother    Hypertension Maternal Grandmother    Hypertension Sister    Scoliosis Sister    Colon cancer Neg Hx    Heart attack Neg Hx    Breast cancer Neg Hx      Prior to Admission medications   Medication Sig Start Date End Date Taking? Authorizing Provider  Cholecalciferol (VITAMIN D -3) 1000 UNITS CAPS Take 1,000 Units by mouth 2 (two) times daily.     [provider]  diltiazem  (CARTIA  XT) 180 MG 24 hr capsule Take 1 capsule (180 mg total) by mouth at bedtime. Patient taking differently: Take 180 mg by mouth every morning. 03/25/23   Hilty, [name], MD  ELIQUIS 2.5 MG TABS tablet Take 2.5 mg by mouth 2 (two) times daily. 02/26/23   [provider]  levothyroxine  (SYNTHROID ) 125 MCG tablet TAKE 1 TABLET BY MOUTH DAILY BEFORE BREAKFAST. Patient taking differently: Take 125 mcg by mouth daily before breakfast. Take 125 mcg Monday-Saturday and 75 mcg on Sunday 09/16/23   Emilie Harden, MD  LORazepam  (ATIVAN ) 0.5 MG tablet Take 1 tablet (0.5 mg total) by mouth 2 (two) times daily as needed for anxiety. 12/13/20   Mast, Man X, NP   Multiple Vitamin (MULTIVITAMIN) capsule Take 1 capsule by mouth daily.    [provider]  Multiple Vitamins-Minerals (PRESERVISION AREDS PO) Take 1 tablet by mouth daily.    [provider]    Physical Exam: BP (!) 157/96   Pulse 66   Temp 98 F (36.7 C) (Oral)   Resp (!) 28   SpO2 100%  General: Pleasant, well-appearing *** laying in bed. No acute distress. HEENT: [place]/AT. Anicteric sclera CV: RRR. No murmurs, rubs, or gallops. No LE edema Pulmonary: Lungs CTAB. Normal effort. No wheezing or rales. Abdominal: Soft, nontender, nondistended. Normal bowel sounds. Extremities: Palpable radial and DP pulses. Normal ROM. Skin: Warm and dry. No obvious rash or lesions. Neuro: A&Ox3. Moves all extremities. Normal sensation to light touch. No focal deficit. Psych: Normal mood and affect          Labs on Admission:  Basic Metabolic Panel: Recent Labs  Lab 01/02/24 1208  NA 127*  K 3.7  CL 91*  CO2 25  GLUCOSE 129*  BUN 17  CREATININE 0.80  CALCIUM 9.7   Liver Function Tests: Recent Labs  Lab 01/02/24 1208  AST 24  ALT 20  ALKPHOS 64  BILITOT 0.9  PROT 6.2*  ALBUMIN 3.8   No results for input(s): "LIPASE", "AMYLASE" in the last 168 hours. No results for input(s): "AMMONIA" in the last 168 hours. CBC: Recent Labs  Lab 01/02/24 1208  WBC 5.9  HGB 14.3  HCT 40.9  MCV 94.2  PLT 147*   Cardiac Enzymes: No results for input(s): "CKTOTAL", "CKMB", "CKMBINDEX", "TROPONINI" in the last 168 hours. BNP (last 3 results) No results for input(s): "BNP" in the last 8760 hours.  ProBNP (last 3 results) No results for input(s): "PROBNP" in the last 8760 hours.  CBG: Recent Labs  Lab 01/02/24 1241  GLUCAP 110*    Radiological Exams on Admission: CT ABDOMEN PELVIS W CONTRAST Result Date: 01/02/2024 CLINICAL DATA:  Abdominal pain. Palpitations. Suspected bowel obstruction. EXAM: CT ABDOMEN AND PELVIS WITH CONTRAST TECHNIQUE: Multidetector CT imaging  of the abdomen and pelvis was performed using the standard protocol following bolus administration of intravenous contrast. RADIATION DOSE REDUCTION: This exam was performed according to the departmental dose-optimization program which includes automated exposure control, adjustment of the mA and/or kV according to patient size and/or use of iterative reconstruction technique. CONTRAST:  75mL OMNIPAQUE IOHEXOL 350 MG/ML SOLN COMPARISON:  None Available. FINDINGS: Lower Chest: Mild compressive bibasilar atelectasis and tiny left pleural effusion. Hepatobiliary: No suspicious hepatic masses identified. A few small hepatic cysts are noted. Gallbladder is unremarkable. No evidence of biliary ductal dilatation. Pancreas:  No mass or inflammatory changes. Spleen: Within normal limits in size and appearance. Adrenals/Urinary Tract: No suspicious masses identified. Small benign-appearing bilateral  subcapsular renal cysts incidentally noted (No followup imaging is recommended). No evidence of ureteral calculi or hydronephrosis. Unremarkable unopacified urinary bladder. Stomach/Bowel: A large hiatal hernia is seen which contains nearly the entire stomach, transverse colon, and pancreatic tail. No evidence of bowel obstruction, inflammatory process or abnormal fluid collections. Vascular/Lymphatic: No pathologically enlarged lymph nodes. No acute vascular findings. Reproductive:  No mass or other significant abnormality. Other:  None. Musculoskeletal: No suspicious bone lesions identified. Severe thoracolumbar rotatory levoscoliosis noted. IMPRESSION: No evidence of bowel obstruction or other acute findings. Large hiatal hernia, which contains nearly the entire stomach, transverse colon, and pancreatic tail. Severe thoracolumbar levoscoliosis. Mild bibasilar atelectasis and tiny left pleural effusion. Electronically Signed   By: Marlyce Sine M.D.   On: 01/02/2024 14:30   DG Chest Portable 1 View Result Date:  01/02/2024 CLINICAL DATA:  palpitations EXAM: PORTABLE CHEST 1 VIEW COMPARISON:  April 04, 2023, April 07, 2015 FINDINGS: Evaluation is limited by rotation. Revisualization of a large diaphragmatic hernia. Cardiomediastinal silhouette is grossly unchanged. No pleural effusion or pneumothorax. No acute pleuroparenchymal abnormality. Atherosclerotic calcifications. Severe levo scoliosis of the thoracic spine. IMPRESSION: No acute cardiopulmonary abnormality. Electronically Signed   By: Clancy Crimes M.D.   On: 01/02/2024 13:18   Assessment/Plan Stacy [name] is a 83 y.o. female with medical history significant for paroxysmal A-fib on Eliquis, carotid artery stenosis, HLD, HTN, hypothyroidism, and anxiety who presented to the ED for evaluation of dizziness and palpitations.   #***  #***  #***  #***  #***  #***  #***  DVT prophylaxis: Lovenox     Code Status: Not on file  Consults called: Cardiology  Family Communication: ***  Severity of Illness: The appropriate patient status for this patient is OBSERVATION. Observation status is judged to be reasonable and necessary in order to provide the required intensity of service to ensure the patient's safety. The patient's presenting symptoms, physical exam findings, and initial radiographic and laboratory data in the context of their medical condition is felt to place them at decreased risk for further clinical deterioration. Furthermore, it is anticipated that the patient will be medically stable for discharge from the hospital within 2 midnights of admission.   Level of care: Telemetry Cardiac   This record has been created using Conservation officer, historic buildings. Errors have been sought and corrected, but may not always be located. Such creation errors do not reflect on the standard of care.   Vita Grip, MD 01/02/2024, 7:03 PM [facility] Pager: [phone] Isaiah 41:10   If 7PM-7AM, please contact  night-coverage [email or web address] Password [email or web address]

## 2024-01-02 NOTE — ED Notes (Signed)
 Report given 2C16 pt is AO x4, denies any pain at this time, NAD noticed, pt to be transport by this RN on monitor to admitting unit.

## 2024-01-02 NOTE — ED Triage Notes (Signed)
 Pt c.o feeling lightheaded the past two mornings after waking up, it goes away after noon but she also felt palpitations this morning. Denies palpitations at this time. Pt a.o

## 2024-01-02 NOTE — ED Notes (Signed)
Pt unable to provide urine sample in triage

## 2024-01-02 NOTE — Consult Note (Signed)
 Cardiology Consultation:   Patient ID: Stacy [name] MRN: [id number]; DOB: 03/30/1941  Admit date: 01/02/2024 Date of Consult: 01/02/2024  Primary Care Provider: Chares Commons, PA-C [facility] Cardiologist: Hazle Lites, MD  [facility] Electrophysiologist:  [name], MD   Patient Profile:   Stacy [name] is a 83 y.o. female with a hx of paroxysmal AF, mild carotid artery stenosis, HTN and hypothyroidism who is being seen today for the evaluation of  at the request of Dr. Zola Hint.  History of Present Illness:   Stacy [name] ***  Past Medical History:  Diagnosis Date   Atrial fibrillation (HCC)    echo 12/23/10 EF= >55%, stress myoview 01/30/11 normal pattern of perfusion in all myocardial regions   Carotid artery stenosis    Per [facility] new patient packet    Chicken pox    Colon polyp    Colon polyp    Dyslipidemia    Heart murmur    Hiatal hernia    Hypertension    Hypothyroidism    Laryngopharyngeal reflux    Osteopenia    Scoliosis    Seasonal allergies    Thyroid  disease    Vitamin D  deficiency     Past Surgical History:  Procedure Laterality Date   BREAST CYST EXCISION Right 1988   ESOPHAGEAL MANOMETRY N/A 08/29/2015   Procedure: ESOPHAGEAL MANOMETRY (EM);  Surgeon: Danette Duos, MD;  Location: [facility];  Service: Gastroenterology;  Laterality: N/A;   EYE SURGERY     Cataract eye surgery-right 06/2014, left 04/2014   ORIF PATELLA Right 07/08/2022   Procedure: OPEN REDUCTION INTERNAL FIXATION (ORIF) PATELLA;  Surgeon: Osa Blase, MD;  Location: [facility];  Service: Orthopedics;  Laterality: Right;   TONSILLECTOMY     Childhood     {Home Medications (Optional):21181}  Inpatient Medications: Scheduled Meds:  Continuous Infusions:  PRN Meds:   Allergies:    Allergies  Allergen Reactions   Amoxicillin Swelling   Flonase [Fluticasone Propionate] Swelling    Swelling of throat per patient     Sulfa Antibiotics Other (See Comments)    Upset stomach   Sulfasalazine Other (See Comments)    Upset stomach    Social History:   Social History   Socioeconomic History   Marital status: Single    Spouse name: Not on file   Number of children: Not on file   Years of education: Not on file   Highest education level: Not on file  Occupational History   Occupation: retired  Tobacco Use   Smoking status: Never   Smokeless tobacco: Never  Substance and Sexual Activity   Alcohol use: Not Currently    Alcohol/week: 1.0 standard drink of alcohol    Types: 1 Glasses of wine per week    Comment: 1 glass of wine 3x a week   Drug use: No   Sexual activity: Not on file  Other Topics Concern   Not on file  Social History Narrative   Work or School: Clinical research associate - poetry      Home Situation: lives alone      Spiritual Beliefs: Jewish      Lifestyle[facility] [employer] [facility]; diet healthy      Per [facility] New Patient Packet:      Diet: Heart healthy, low-fat      Caffeine: 1 cup of tea daily       Married, if yes what year: Single      Do  you live in a house, apartment, assisted living, condo, trailer, ect: Apartment, 1 person, 7 stories       Pets: No      Current/Past profession: PHD, Child psychotherapist      Exercise: Yes, Treadmill, weights, and walking          Living Will: Yes   DNR: Yes   POA/HPOA: Yes      Functional Status:   Do you have difficulty bathing or dressing yourself? No   Do you have difficulty preparing food or eating? No   Do you have difficulty managing your medications? No   Do you have difficulty managing your finances? No   Do you have difficulty affording your medications? No         Social Drivers of Corporate investment banker Strain: Not on file  Food Insecurity: Not on file  Transportation Needs: Not on file  Physical Activity: Not on file  Stress: Not on file  Social Connections: Not on file  Intimate Partner Violence: Not on file     Family History:   *** Family History  Problem Relation Age of Onset   CVA Mother    Hyperlipidemia Mother    Hypertension Mother    Anuerysm Father        brain   Stroke Maternal Grandmother    Hypertension Maternal Grandmother    Hypertension Sister    Scoliosis Sister    Colon cancer Neg Hx    Heart attack Neg Hx    Breast cancer Neg Hx      ROS:  Review of Systems: [y] = yes, [ ]  = no      General: Weight gain [ ] ; Weight loss [ ] ; Anorexia [ ] ; Fatigue [ ] ; Fever [ ] ; Chills [ ] ; Weakness [ ]    Cardiac: Chest pain/pressure [ ] ; Resting SOB [ ] ; Exertional SOB [ ] ; Orthopnea [ ] ; Pedal Edema [ ] ; Palpitations [ ] ; Syncope [ ] ; Presyncope [ ] ; Paroxysmal nocturnal dyspnea [ ]    Pulmonary: Cough [ ] ; Wheezing [ ] ; Hemoptysis [ ] ; Sputum [ ] ; Snoring [ ]    GI: Vomiting [ ] ; Dysphagia [ ] ; Melena [ ] ; Hematochezia [ ] ; Heartburn [ ] ; Abdominal pain [ ] ; Constipation [ ] ; Diarrhea [ ] ; BRBPR [ ]    GU: Hematuria [ ] ; Dysuria [ ] ; Nocturia [ ]  Vascular: Pain in legs with walking [ ] ; Pain in feet with lying flat [ ] ; Non-healing sores [ ] ; Stroke [ ] ; TIA [ ] ; Slurred speech [ ] ;   Neuro: Headaches [ ] ; Vertigo [ ] ; Seizures [ ] ; Paresthesias [ ] ;Blurred vision [ ] ; Diplopia [ ] ; Vision changes [ ]    Ortho/Skin: Arthritis [ ] ; Joint pain [ ] ; Muscle pain [ ] ; Joint swelling [ ] ; Back Pain [ ] ; Rash [ ]    Psych: Depression [ ] ; Anxiety [ ]    Heme: Bleeding problems [ ] ; Clotting disorders [ ] ; Anemia [ ]    Endocrine: Diabetes [ ] ; Thyroid  dysfunction [ ]    Physical Exam/Data:   Vitals:   01/02/24 1158 01/02/24 1500 01/02/24 1539 01/02/24 1630  BP:  111/88 (!) 144/78 (!) 140/87  Pulse:  73 64 71  Resp:  18 18 18   Temp: 98.1 F (36.7 C)  98.4 F (36.9 C)   TempSrc: Oral  Oral   SpO2:  99% 100% 100%   No intake or output data in the 24 hours ending 01/02/24 1728  12/30/2023   11:03 AM 12/10/2023   10:54 AM 10/02/2023    3:38 PM  Last 3 Weights  Weight (lbs) 119 lb 119 lb  118 lb 6.4 oz  Weight (kg) 53.978 kg 53.978 kg 53.706 kg     There is no height or weight on file to calculate BMI.  General:  Well nourished, well developed, in no acute distress*** HEENT: normal Lymph: no adenopathy Neck: no JVD Endocrine:  No thryomegaly Vascular: No carotid bruits; FA pulses 2+ bilaterally without bruits  Cardiac:  normal S1, S2; RRR; no murmur *** Lungs:  clear to auscultation bilaterally, no wheezing, rhonchi or rales  Abd: soft, nontender, no hepatomegaly  Ext: no edema Musculoskeletal:  No deformities, BUE and BLE strength normal and equal Skin: warm and dry  Neuro:  CNs 2-12 intact, no focal abnormalities noted Psych:  Normal affect   EKG:  The EKG was personally reviewed and demonstrates:  *** Telemetry:  Telemetry was personally reviewed and demonstrates:  ***  Relevant CV Studies: ***  Laboratory Data:  High Sensitivity Troponin:   Recent Labs  Lab 01/02/24 1225 01/02/24 1401 01/02/24 1619  TROPONINIHS 22* 29* 72*     Chemistry Recent Labs  Lab 01/02/24 1208  NA 127*  K 3.7  CL 91*  CO2 25  GLUCOSE 129*  BUN 17  CREATININE 0.80  CALCIUM 9.7  GFRNONAA >60  ANIONGAP 11    Recent Labs  Lab 01/02/24 1208  PROT 6.2*  ALBUMIN 3.8  AST 24  ALT 20  ALKPHOS 64  BILITOT 0.9   Hematology Recent Labs  Lab 01/02/24 1208  WBC 5.9  RBC 4.34  HGB 14.3  HCT 40.9  MCV 94.2  MCH 32.9  MCHC 35.0  RDW 12.8  PLT 147*   BNPNo results for input(s): "BNP", "PROBNP" in the last 168 hours.  DDimer No results for input(s): "DDIMER" in the last 168 hours.  Radiology/Studies:  CT ABDOMEN PELVIS W CONTRAST Result Date: 01/02/2024 CLINICAL DATA:  Abdominal pain. Palpitations. Suspected bowel obstruction. EXAM: CT ABDOMEN AND PELVIS WITH CONTRAST TECHNIQUE: Multidetector CT imaging of the abdomen and pelvis was performed using the standard protocol following bolus administration of intravenous contrast. RADIATION DOSE REDUCTION: This exam was  performed according to the departmental dose-optimization program which includes automated exposure control, adjustment of the mA and/or kV according to patient size and/or use of iterative reconstruction technique. CONTRAST:  75mL OMNIPAQUE IOHEXOL 350 MG/ML SOLN COMPARISON:  None Available. FINDINGS: Lower Chest: Mild compressive bibasilar atelectasis and tiny left pleural effusion. Hepatobiliary: No suspicious hepatic masses identified. A few small hepatic cysts are noted. Gallbladder is unremarkable. No evidence of biliary ductal dilatation. Pancreas:  No mass or inflammatory changes. Spleen: Within normal limits in size and appearance. Adrenals/Urinary Tract: No suspicious masses identified. Small benign-appearing bilateral subcapsular renal cysts incidentally noted (No followup imaging is recommended). No evidence of ureteral calculi or hydronephrosis. Unremarkable unopacified urinary bladder. Stomach/Bowel: A large hiatal hernia is seen which contains nearly the entire stomach, transverse colon, and pancreatic tail. No evidence of bowel obstruction, inflammatory process or abnormal fluid collections. Vascular/Lymphatic: No pathologically enlarged lymph nodes. No acute vascular findings. Reproductive:  No mass or other significant abnormality. Other:  None. Musculoskeletal: No suspicious bone lesions identified. Severe thoracolumbar rotatory levoscoliosis noted. IMPRESSION: No evidence of bowel obstruction or other acute findings. Large hiatal hernia, which contains nearly the entire stomach, transverse colon, and pancreatic tail. Severe thoracolumbar levoscoliosis. Mild bibasilar atelectasis and tiny left pleural effusion.  Electronically Signed   By: Marlyce Sine M.D.   On: 01/02/2024 14:30   DG Chest Portable 1 View Result Date: 01/02/2024 CLINICAL DATA:  palpitations EXAM: PORTABLE CHEST 1 VIEW COMPARISON:  April 04, 2023, April 07, 2015 FINDINGS: Evaluation is limited by rotation. Revisualization of  a large diaphragmatic hernia. Cardiomediastinal silhouette is grossly unchanged. No pleural effusion or pneumothorax. No acute pleuroparenchymal abnormality. Atherosclerotic calcifications. Severe levo scoliosis of the thoracic spine. IMPRESSION: No acute cardiopulmonary abnormality. Electronically Signed   By: Clancy Crimes M.D.   On: 01/02/2024 13:18   { If the patient is being seen for chest pain, [place] , NSTEMI or STEMI Press F2 to calculate a risk score         :[id number]}   {Chest Pain/ACS Risk Score        :4098119147}   Assessment and Plan:   ***  {Are we signing off today?:[id number]}  For questions or updates, please contact [facility] Please consult [email or web address] for contact info under    Signed, Katrine Parody, MD  01/02/2024 5:28 PM

## 2024-01-03 DIAGNOSIS — I480 Paroxysmal atrial fibrillation: Secondary | ICD-10-CM | POA: Diagnosis not present

## 2024-01-03 DIAGNOSIS — R002 Palpitations: Principal | ICD-10-CM

## 2024-01-03 DIAGNOSIS — R42 Dizziness and giddiness: Secondary | ICD-10-CM

## 2024-01-03 DIAGNOSIS — E871 Hypo-osmolality and hyponatremia: Secondary | ICD-10-CM

## 2024-01-03 LAB — BASIC METABOLIC PANEL WITH GFR
Anion gap: 8 (ref 5–15)
BUN: 15 mg/dL (ref 8–23)
CO2: 25 mmol/L (ref 22–32)
Calcium: 8.8 mg/dL — ABNORMAL LOW (ref 8.9–10.3)
Chloride: 99 mmol/L (ref 98–111)
Creatinine, Ser: 0.73 mg/dL (ref 0.44–1.00)
GFR, Estimated: >60 mL/min (ref >60)
Glucose, Bld: 90 mg/dL (ref 70–99)
Potassium: 3.5 mmol/L (ref 3.5–5.1)
Sodium: 132 mmol/L — ABNORMAL LOW (ref 135–145)

## 2024-01-03 LAB — CBC
HCT: 35.8 % — ABNORMAL LOW (ref 36.0–46.0)
Hemoglobin: 12.2 g/dL (ref 12.0–15.0)
MCH: 32.2 pg (ref 26.0–34.0)
MCHC: 34.1 g/dL (ref 30.0–36.0)
MCV: 94.5 fL (ref 80.0–100.0)
Platelets: 141 10*3/uL — ABNORMAL LOW (ref 150–400)
RBC: 3.79 MIL/uL — ABNORMAL LOW (ref 3.87–5.11)
RDW: 12.9 % (ref 11.5–15.5)
WBC: 5.5 10*3/uL (ref 4.0–10.5)
nRBC: 0 % (ref 0.0–0.2)

## 2024-01-03 LAB — VITAMIN D 25 HYDROXY (VIT D DEFICIENCY, FRACTURES): Vit D, 25-Hydroxy: 45.59 ng/mL (ref 30–100)

## 2024-01-03 LAB — SODIUM, URINE, RANDOM: Sodium, Ur: 40 mmol/L

## 2024-01-03 LAB — TROPONIN I (HIGH SENSITIVITY)
Troponin I (High Sensitivity): 66 ng/L — ABNORMAL HIGH (ref <18)
Troponin I (High Sensitivity): 60 ng/L — ABNORMAL HIGH (ref <18)

## 2024-01-03 LAB — OSMOLALITY: Osmolality: 280 mosm/kg (ref 275–295)

## 2024-01-03 LAB — OSMOLALITY, URINE: Osmolality, Ur: 429 mosm/kg (ref 300–900)

## 2024-01-03 LAB — FOLATE: Folate: 20.4 ng/mL (ref >5.9)

## 2024-01-03 LAB — VITAMIN B12: Vitamin B-12: 508 pg/mL (ref 180–914)

## 2024-01-03 NOTE — Care Management Obs Status (Signed)
 MEDICARE OBSERVATION STATUS NOTIFICATION   Patient Details  Name: Stacy [name] MRN: [id number] Date of Birth: [birth year]   Medicare Observation Status Notification Given:  Yes    Jannine Meo, RN 01/03/2024, 2:06 PM

## 2024-01-03 NOTE — Discharge Summary (Signed)
 [facility] Discharge Summary   Patient: Stacy [name] RJJ:[id number]  PCP: Chares Commons, PA-C  Date of admission: 01/02/2024   Date of discharge:  01/03/2024     Discharge Diagnoses:  Principal Problem:   Paroxysmal A-fib (HCC) Active Problems:   Dizziness   Palpitations   Hyponatremia   Admitted From: Home Disposition:  Home   Recommendations for Outpatient Follow-up:  Follow-up with PCP in 1 week Follow-up with cardiology in 1 week Follow up LABS/TEST:  CBC and BMP in 1 wk   Follow-up Information     Chares Commons, PA-C Follow up in 1 week(s).   Specialty: Physician Assistant Contact information: [address] [place], [place] [address] [phone]                Diet recommendation: Cardiac diet  Activity: The patient is advised to gradually reintroduce usual activities, as tolerated  Discharge Condition: stable  Code Status: DNR/DNI-limited  History of present illness: As per the H and P dictated on admission. Hospital Course:  Stacy [name] is a 83 y.o. female with medical history significant for paroxysmal A-fib on Eliquis, carotid artery stenosis, HLD, HTN, hypothyroidism, and anxiety who presented to the ED for evaluation of dizziness and palpitations.  Patient reports that over the last few days, she has felt lightheaded but it seemed to improved after she eats and drinks water. This morning, she felt lightheaded again and had associated palpitations so she presented to the ED for further evaluation.  She she reports feeling thirsty and hungry after not eating all day but denies any chest pain, nausea, vomiting, abdominal pain, dysuria, headache, fevers or chills.    ED Course: Initial vitals shows normal heart rate with HR in the 60s to 80s, SBP 100s to 150s, SpO2 98% on room air. Labs significant for sodium 127, normal kidney function, Hgb 14.3, WBC 5.9, platelet 147, troponin 22->29->72, UA with no signs of infection. EKG shows  normal sinus rhythm LAFB. CXR with no active disease. CT A/P  shows large hiatal hernia but no acute intra-abdominal pathology. Received IV NS 500 cc bolus x 1. Cardiology consulted for evaluation. TRH consulted for admission.  Assessment/Plan:   # Paroxysmal A-fib P/w Dizziness and Palpitations. Patient reports intermittent dizziness over the last 2 to 3 days that improves with p.o. intake Reported transient episode of palpitations or lightheadedness earlier on the day of admission. EKG upon admission shows normal sinus rhythm with LAFB Continue Eliquis, and diltiazem  Cardiology consulted, patient was observed overnight on telemetry.  Currently patient is asymptomatic and cleared by cardiology to continue current medications and discharge home and follow-up as an outpatient.  # Elevated troponin Troponin elevated on admission from 22->29->72. Patient with no chest pain or ischemic changes on EKG Likely demand ischemia.  Seen by cardiology, no workup recommended, follow-up as an outpatient with cardio.   # Isotonic hyponatremia:  Sodium of 127 on admission with normal kidney function Likely secondary to mild dehydration and nutritional deficiency. Na 132 today, improved.  Recommend to follow with PCP as an outpatient for further management.  Repeat BMP after 1 week  # Hypothyroidism Repeat labs show TSH 6.15, with mildly elevated free T4 of 1.31 Thyroid  labs inconsistent, likely some interference from biotin in her multivitamins. Reports her endocrinologist reduced her Synthroid  dose earlier this week. Continue current Synthroid  dose and follow-up with endocrinologist.   # HTN: Continue Cardizem , BP stable # Anxiety: Continue as needed Ativan   Body mass index is 19.11  kg/m.  Nutrition Interventions:  - Patient was instructed, not to drive, operate heavy machinery, perform activities at heights, swimming or participation in water activities or provide baby sitting services while on  Pain, Sleep and Anxiety Medications; until her outpatient Physician has advised to do so again.  - Also recommended to not to take more than prescribed Pain, Sleep and Anxiety Medications.  Patient was ambulatory without any assistance. On the day of the discharge the patient's vitals were stable, and no other acute medical condition were reported by patient. the patient was felt safe to be discharge at Home.  Consultants: Cardiology Procedures: None  Discharge Exam: General: Appear in no distress, no Rash; Oral Mucosa Clear, moist. Cardiovascular: S1 and S2 Present, no Murmur, Respiratory: normal respiratory effort, Bilateral Air entry present and no Crackles, no wheezes Abdomen: Bowel Sound present, Soft and no tenderness, no hernia Extremities: no Pedal edema, no calf tenderness Neurology: alert and oriented to time, place, and person affect appropriate.  Filed Weights   01/02/24 2135  Weight: 52.1 kg   Vitals:   01/03/24 0750 01/03/24 1146  BP:  (!) 146/95  Pulse: 76 92  Resp:    Temp:  98 F (36.7 C)  SpO2: 95% 94%    DISCHARGE MEDICATION: Allergies as of 01/03/2024       Reactions   Amoxicillin Swelling   Flonase [fluticasone Propionate] Swelling   Swelling of throat per patient   Sulfa Antibiotics Other (See Comments)   Upset stomach   Sulfasalazine Other (See Comments)   Upset stomach        Medication List     TAKE these medications    BENEFIBER PO Take 0.5 Scoops by mouth as needed.   diltiazem  180 MG 24 hr capsule Commonly known as: Cartia  XT Take 1 capsule (180 mg total) by mouth at bedtime. What changed: when to take this   Eliquis 2.5 MG Tabs tablet Generic drug: apixaban Take 2.5 mg by mouth 2 (two) times daily.   levothyroxine  125 MCG tablet Commonly known as: SYNTHROID  TAKE 1 TABLET BY MOUTH DAILY BEFORE BREAKFAST. What changed: additional instructions   LORazepam  0.5 MG tablet Commonly known as: ATIVAN  Take 1 tablet (0.5 mg total)  by mouth 2 (two) times daily as needed for anxiety. What changed: when to take this   multivitamin capsule Take 1 capsule by mouth every evening.   PRESERVISION AREDS PO Take 1 tablet by mouth daily.   Probiotic Chew Chew 1 each by mouth daily.   RA CRANBERRY SUPPLEMENTS PO Take 2 tablets by mouth every evening.   Vitamin D -3 25 MCG (1000 UT) Caps Take 1,000 Units by mouth 2 (two) times daily.       Allergies  Allergen Reactions   Amoxicillin Swelling   Flonase [Fluticasone Propionate] Swelling    Swelling of throat per patient    Sulfa Antibiotics Other (See Comments)    Upset stomach   Sulfasalazine Other (See Comments)    Upset stomach   Discharge Instructions     Call MD for:   Complete by: As directed    Chest pain or palpitations   Call MD for:  difficulty breathing, headache or visual disturbances   Complete by: As directed    Call MD for:  extreme fatigue   Complete by: As directed    Call MD for:  persistant dizziness or light-headedness   Complete by: As directed    Call MD for:  persistant nausea and vomiting  Complete by: As directed    Call MD for:  severe uncontrolled pain   Complete by: As directed    Call MD for:  temperature >100.4   Complete by: As directed    Diet - low sodium heart healthy   Complete by: As directed    Discharge instructions   Complete by: As directed    Follow-up with PCP in 1 week Follow-up with cardiology in 1 week   Increase activity slowly   Complete by: As directed        The results of significant diagnostics from this hospitalization (including imaging, microbiology, ancillary and laboratory) are listed below for reference.    Significant Diagnostic Studies: CT ABDOMEN PELVIS W CONTRAST Result Date: 01/02/2024 CLINICAL DATA:  Abdominal pain. Palpitations. Suspected bowel obstruction. EXAM: CT ABDOMEN AND PELVIS WITH CONTRAST TECHNIQUE: Multidetector CT imaging of the abdomen and pelvis was performed using  the standard protocol following bolus administration of intravenous contrast. RADIATION DOSE REDUCTION: This exam was performed according to the departmental dose-optimization program which includes automated exposure control, adjustment of the mA and/or kV according to patient size and/or use of iterative reconstruction technique. CONTRAST:  75mL OMNIPAQUE IOHEXOL 350 MG/ML SOLN COMPARISON:  None Available. FINDINGS: Lower Chest: Mild compressive bibasilar atelectasis and tiny left pleural effusion. Hepatobiliary: No suspicious hepatic masses identified. A few small hepatic cysts are noted. Gallbladder is unremarkable. No evidence of biliary ductal dilatation. Pancreas:  No mass or inflammatory changes. Spleen: Within normal limits in size and appearance. Adrenals/Urinary Tract: No suspicious masses identified. Small benign-appearing bilateral subcapsular renal cysts incidentally noted (No followup imaging is recommended). No evidence of ureteral calculi or hydronephrosis. Unremarkable unopacified urinary bladder. Stomach/Bowel: A large hiatal hernia is seen which contains nearly the entire stomach, transverse colon, and pancreatic tail. No evidence of bowel obstruction, inflammatory process or abnormal fluid collections. Vascular/Lymphatic: No pathologically enlarged lymph nodes. No acute vascular findings. Reproductive:  No mass or other significant abnormality. Other:  None. Musculoskeletal: No suspicious bone lesions identified. Severe thoracolumbar rotatory levoscoliosis noted. IMPRESSION: No evidence of bowel obstruction or other acute findings. Large hiatal hernia, which contains nearly the entire stomach, transverse colon, and pancreatic tail. Severe thoracolumbar levoscoliosis. Mild bibasilar atelectasis and tiny left pleural effusion. Electronically Signed   By: Marlyce Sine M.D.   On: 01/02/2024 14:30   DG Chest Portable 1 View Result Date: 01/02/2024 CLINICAL DATA:  palpitations EXAM: PORTABLE CHEST  1 VIEW COMPARISON:  April 04, 2023, April 07, 2015 FINDINGS: Evaluation is limited by rotation. Revisualization of a large diaphragmatic hernia. Cardiomediastinal silhouette is grossly unchanged. No pleural effusion or pneumothorax. No acute pleuroparenchymal abnormality. Atherosclerotic calcifications. Severe levo scoliosis of the thoracic spine. IMPRESSION: No acute cardiopulmonary abnormality. Electronically Signed   By: Clancy Crimes M.D.   On: 01/02/2024 13:18    Microbiology: Recent Results (from the past 240 hours)  MRSA Next Gen by PCR, Nasal     Status: None   Collection Time: 01/02/24  9:40 PM   Specimen: Nasal Mucosa; Nasal Swab  Result Value Ref Range Status   MRSA by PCR Next Gen NOT DETECTED NOT DETECTED Final    Comment: (NOTE) The GeneXpert MRSA Assay (FDA approved for NASAL specimens only), is one component of a comprehensive MRSA colonization surveillance program. It is not intended to diagnose MRSA infection nor to guide or monitor treatment for MRSA infections. Test performance is not FDA approved in patients less than 75 years old. Performed at [facility],  [address], [place], [place] [address]      Labs: CBC: Recent Labs  Lab 01/02/24 1208 01/03/24 0214  WBC 5.9 5.5  HGB 14.3 12.2  HCT 40.9 35.8*  MCV 94.2 94.5  PLT 147* 141*   Basic Metabolic Panel: Recent Labs  Lab 01/02/24 1208 01/02/24 2248 01/03/24 0214  NA 127*  --  132*  K 3.7  --  3.5  CL 91*  --  99  CO2 25  --  25  GLUCOSE 129*  --  90  BUN 17  --  15  CREATININE 0.80  --  0.73  CALCIUM 9.7  --  8.8*  MG  --  1.9  --   PHOS  --  3.1  --    Liver Function Tests: Recent Labs  Lab 01/02/24 1208  AST 24  ALT 20  ALKPHOS 64  BILITOT 0.9  PROT 6.2*  ALBUMIN 3.8   No results for input(s): "LIPASE", "AMYLASE" in the last 168 hours. No results for input(s): "AMMONIA" in the last 168 hours. Cardiac Enzymes: No results for input(s): "CKTOTAL", "CKMB",  "CKMBINDEX", "TROPONINI" in the last 168 hours. BNP (last 3 results) No results for input(s): "BNP" in the last 8760 hours. CBG: Recent Labs  Lab 01/02/24 1241  GLUCAP 110*    Time spent: 35 minutes  Signed:  Althia Atlas  [facility] 01/03/2024 1:49 PM

## 2024-01-06 ENCOUNTER — Other Ambulatory Visit: Payer: Self-pay | Admitting: Internal Medicine

## 2024-01-06 DIAGNOSIS — E039 Hypothyroidism, unspecified: Secondary | ICD-10-CM

## 2024-01-12 ENCOUNTER — Encounter: Payer: Self-pay | Admitting: Internal Medicine

## 2024-01-13 ENCOUNTER — Ambulatory Visit: Admitting: Physician Assistant

## 2024-01-14 ENCOUNTER — Emergency Department (HOSPITAL_COMMUNITY)

## 2024-01-14 ENCOUNTER — Inpatient Hospital Stay (HOSPITAL_COMMUNITY)
Admission: EM | Admit: 2024-01-14 | Discharge: 2024-01-18 | DRG: 064 | Disposition: A | Discharge status: Skilled Nursing Facility | Source: Skilled Nursing Facility | Attending: Internal Medicine | Admitting: Internal Medicine

## 2024-01-14 ENCOUNTER — Other Ambulatory Visit: Payer: Self-pay

## 2024-01-14 ENCOUNTER — Observation Stay (HOSPITAL_COMMUNITY)

## 2024-01-14 DIAGNOSIS — Z8249 Family history of ischemic heart disease and other diseases of the circulatory system: Secondary | ICD-10-CM

## 2024-01-14 DIAGNOSIS — Z7901 Long term (current) use of anticoagulants: Secondary | ICD-10-CM | POA: Diagnosis not present

## 2024-01-14 DIAGNOSIS — R29706 NIHSS score 6: Secondary | ICD-10-CM | POA: Diagnosis not present

## 2024-01-14 DIAGNOSIS — E222 Syndrome of inappropriate secretion of antidiuretic hormone: Secondary | ICD-10-CM | POA: Diagnosis present

## 2024-01-14 DIAGNOSIS — E039 Hypothyroidism, unspecified: Secondary | ICD-10-CM | POA: Diagnosis present

## 2024-01-14 DIAGNOSIS — Z79899 Other long term (current) drug therapy: Secondary | ICD-10-CM

## 2024-01-14 DIAGNOSIS — I639 Cerebral infarction, unspecified: Principal | ICD-10-CM

## 2024-01-14 DIAGNOSIS — J302 Other seasonal allergic rhinitis: Secondary | ICD-10-CM | POA: Diagnosis present

## 2024-01-14 DIAGNOSIS — I480 Paroxysmal atrial fibrillation: Secondary | ICD-10-CM | POA: Diagnosis present

## 2024-01-14 DIAGNOSIS — Z880 Allergy status to penicillin: Secondary | ICD-10-CM

## 2024-01-14 DIAGNOSIS — I6523 Occlusion and stenosis of bilateral carotid arteries: Secondary | ICD-10-CM | POA: Diagnosis not present

## 2024-01-14 DIAGNOSIS — K589 Irritable bowel syndrome without diarrhea: Secondary | ICD-10-CM | POA: Diagnosis present

## 2024-01-14 DIAGNOSIS — M6281 Muscle weakness (generalized): Secondary | ICD-10-CM | POA: Diagnosis not present

## 2024-01-14 DIAGNOSIS — I10 Essential (primary) hypertension: Secondary | ICD-10-CM | POA: Diagnosis not present

## 2024-01-14 DIAGNOSIS — Z79890 Hormone replacement therapy (postmenopausal): Secondary | ICD-10-CM

## 2024-01-14 DIAGNOSIS — R531 Weakness: Secondary | ICD-10-CM | POA: Diagnosis not present

## 2024-01-14 DIAGNOSIS — E876 Hypokalemia: Secondary | ICD-10-CM | POA: Diagnosis not present

## 2024-01-14 DIAGNOSIS — R29818 Other symptoms and signs involving the nervous system: Secondary | ICD-10-CM | POA: Diagnosis not present

## 2024-01-14 DIAGNOSIS — I63511 Cerebral infarction due to unspecified occlusion or stenosis of right middle cerebral artery: Secondary | ICD-10-CM | POA: Diagnosis not present

## 2024-01-14 DIAGNOSIS — M8580 Other specified disorders of bone density and structure, unspecified site: Secondary | ICD-10-CM | POA: Diagnosis present

## 2024-01-14 DIAGNOSIS — I4891 Unspecified atrial fibrillation: Secondary | ICD-10-CM

## 2024-01-14 DIAGNOSIS — R9089 Other abnormal findings on diagnostic imaging of central nervous system: Secondary | ICD-10-CM | POA: Diagnosis not present

## 2024-01-14 DIAGNOSIS — R4781 Slurred speech: Secondary | ICD-10-CM | POA: Diagnosis not present

## 2024-01-14 DIAGNOSIS — Z743 Need for continuous supervision: Secondary | ICD-10-CM | POA: Diagnosis not present

## 2024-01-14 DIAGNOSIS — G8194 Hemiplegia, unspecified affecting left nondominant side: Secondary | ICD-10-CM | POA: Diagnosis present

## 2024-01-14 DIAGNOSIS — I618 Other nontraumatic intracerebral hemorrhage: Secondary | ICD-10-CM | POA: Diagnosis present

## 2024-01-14 DIAGNOSIS — Z860100 Personal history of colon polyps, unspecified: Secondary | ICD-10-CM

## 2024-01-14 DIAGNOSIS — Z823 Family history of stroke: Secondary | ICD-10-CM

## 2024-01-14 DIAGNOSIS — E785 Hyperlipidemia, unspecified: Secondary | ICD-10-CM | POA: Diagnosis present

## 2024-01-14 DIAGNOSIS — I6389 Other cerebral infarction: Secondary | ICD-10-CM

## 2024-01-14 DIAGNOSIS — I672 Cerebral atherosclerosis: Secondary | ICD-10-CM | POA: Diagnosis not present

## 2024-01-14 DIAGNOSIS — I6782 Cerebral ischemia: Secondary | ICD-10-CM | POA: Diagnosis not present

## 2024-01-14 DIAGNOSIS — F419 Anxiety disorder, unspecified: Secondary | ICD-10-CM | POA: Diagnosis present

## 2024-01-14 DIAGNOSIS — Z888 Allergy status to other drugs, medicaments and biological substances status: Secondary | ICD-10-CM

## 2024-01-14 DIAGNOSIS — Z882 Allergy status to sulfonamides status: Secondary | ICD-10-CM

## 2024-01-14 DIAGNOSIS — E860 Dehydration: Secondary | ICD-10-CM | POA: Diagnosis present

## 2024-01-14 DIAGNOSIS — R42 Dizziness and giddiness: Secondary | ICD-10-CM | POA: Diagnosis not present

## 2024-01-14 DIAGNOSIS — R29810 Facial weakness: Secondary | ICD-10-CM | POA: Diagnosis present

## 2024-01-14 LAB — I-STAT CHEM 8, ED
BUN: 20 mg/dL (ref 8–23)
Calcium, Ion: 1.09 mmol/L — ABNORMAL LOW (ref 1.15–1.40)
Chloride: 96 mmol/L — ABNORMAL LOW (ref 98–111)
Creatinine, Ser: 0.7 mg/dL (ref 0.44–1.00)
Glucose, Bld: 115 mg/dL — ABNORMAL HIGH (ref 70–99)
HCT: 40 % (ref 36.0–46.0)
Hemoglobin: 13.6 g/dL (ref 12.0–15.0)
Potassium: 3.7 mmol/L (ref 3.5–5.1)
Sodium: 129 mmol/L — ABNORMAL LOW (ref 135–145)
TCO2: 23 mmol/L (ref 22–32)

## 2024-01-14 LAB — TSH: TSH: 5.952 u[IU]/mL — ABNORMAL HIGH (ref 0.350–4.500)

## 2024-01-14 LAB — CBC
HCT: 38.8 % (ref 36.0–46.0)
Hemoglobin: 13.6 g/dL (ref 12.0–15.0)
MCH: 32.9 pg (ref 26.0–34.0)
MCHC: 35.1 g/dL (ref 30.0–36.0)
MCV: 93.9 fL (ref 80.0–100.0)
Platelets: 153 10*3/uL (ref 150–400)
RBC: 4.13 MIL/uL (ref 3.87–5.11)
RDW: 12.7 % (ref 11.5–15.5)
WBC: 5.4 10*3/uL (ref 4.0–10.5)
nRBC: 0 % (ref 0.0–0.2)

## 2024-01-14 LAB — COMPREHENSIVE METABOLIC PANEL WITH GFR
ALT: 20 U/L (ref 0–44)
AST: 26 U/L (ref 15–41)
Albumin: 3.9 g/dL (ref 3.5–5.0)
Alkaline Phosphatase: 56 U/L (ref 38–126)
Anion gap: 10 (ref 5–15)
BUN: 19 mg/dL (ref 8–23)
CO2: 23 mmol/L (ref 22–32)
Calcium: 9.2 mg/dL (ref 8.9–10.3)
Chloride: 96 mmol/L — ABNORMAL LOW (ref 98–111)
Creatinine, Ser: 0.89 mg/dL (ref 0.44–1.00)
GFR, Estimated: >60 mL/min (ref >60)
Glucose, Bld: 113 mg/dL — ABNORMAL HIGH (ref 70–99)
Potassium: 3.9 mmol/L (ref 3.5–5.1)
Sodium: 129 mmol/L — ABNORMAL LOW (ref 135–145)
Total Bilirubin: 0.9 mg/dL (ref 0.0–1.2)
Total Protein: 6.5 g/dL (ref 6.5–8.1)

## 2024-01-14 LAB — DIFFERENTIAL
Abs Immature Granulocytes: 0.02 10*3/uL (ref 0.00–0.07)
Basophils Absolute: 0 10*3/uL (ref 0.0–0.1)
Basophils Relative: 0 %
Eosinophils Absolute: 0 10*3/uL (ref 0.0–0.5)
Eosinophils Relative: 0 %
Immature Granulocytes: 0 %
Lymphocytes Relative: 17 %
Lymphs Abs: 0.9 10*3/uL (ref 0.7–4.0)
Monocytes Absolute: 0.6 10*3/uL (ref 0.1–1.0)
Monocytes Relative: 11 %
Neutro Abs: 3.9 10*3/uL (ref 1.7–7.7)
Neutrophils Relative %: 72 %

## 2024-01-14 LAB — T4, FREE: Free T4: 1.38 ng/dL — ABNORMAL HIGH (ref 0.61–1.12)

## 2024-01-14 LAB — PROTIME-INR
INR: 1.3 — ABNORMAL HIGH (ref 0.8–1.2)
Prothrombin Time: 16.2 s — ABNORMAL HIGH (ref 11.4–15.2)

## 2024-01-14 LAB — MAGNESIUM: Magnesium: 1.9 mg/dL (ref 1.7–2.4)

## 2024-01-14 LAB — ECHOCARDIOGRAM COMPLETE
AR max vel: 1.39 cm2
AV Area VTI: 1.43 cm2
AV Area mean vel: 1.58 cm2
AV Mean grad: 17 mmHg
AV Peak grad: 36.5 mmHg
Ao pk vel: 3.02 m/s
Area-P 1/2: 3.27 cm2
MV VTI: 2.28 cm2
S' Lateral: 1.5 cm
Weight: 2010.6 [oz_av]

## 2024-01-14 LAB — ETHANOL: Alcohol, Ethyl (B): <15 mg/dL (ref <15)

## 2024-01-14 LAB — CBG MONITORING, ED: Glucose-Capillary: 120 mg/dL — ABNORMAL HIGH (ref 70–99)

## 2024-01-14 LAB — APTT: aPTT: 33 s (ref 24–36)

## 2024-01-14 MED ORDER — HYDRALAZINE HCL 20 MG/ML IJ SOLN: 10.0000 mg | Freq: Three times a day (TID) | INTRAMUSCULAR | Status: DC | PRN

## 2024-01-14 MED ORDER — STROKE: EARLY STAGES OF RECOVERY BOOK
Freq: Once | Status: AC
  Filled 2024-01-14: qty 1

## 2024-01-14 MED ORDER — IOHEXOL 350 MG/ML SOLN
75.0000 mL | Freq: Once | INTRAVENOUS | Status: AC | PRN
  Administered 2024-01-14: 75 mL via INTRAVENOUS

## 2024-01-14 MED ORDER — SODIUM CHLORIDE 0.9 % IV SOLN: INTRAVENOUS | Status: DC

## 2024-01-14 MED ORDER — ACETAMINOPHEN 650 MG RE SUPP: 650.0000 mg | Freq: Four times a day (QID) | RECTAL | Status: DC | PRN

## 2024-01-14 MED ORDER — LORAZEPAM 0.5 MG PO TABS
0.5000 mg | ORAL_TABLET | Freq: Every day | ORAL | Status: DC
  Administered 2024-01-14 – 2024-01-17 (×4): 0.5 mg via ORAL
  Filled 2024-01-14 (×4): qty 1

## 2024-01-14 MED ORDER — ASPIRIN 81 MG PO TBEC
81.0000 mg | DELAYED_RELEASE_TABLET | Freq: Every day | ORAL | Status: DC
  Administered 2024-01-14 – 2024-01-18 (×5): 81 mg via ORAL
  Filled 2024-01-14 (×5): qty 1

## 2024-01-14 MED ORDER — ENOXAPARIN SODIUM 40 MG/0.4ML IJ SOSY
40.0000 mg | PREFILLED_SYRINGE | INTRAMUSCULAR | Status: DC
  Administered 2024-01-14: 40 mg via SUBCUTANEOUS
  Filled 2024-01-14: qty 0.4

## 2024-01-14 MED ORDER — SODIUM CHLORIDE 0.9% FLUSH: 3.0000 mL | Freq: Once | INTRAVENOUS | Status: DC

## 2024-01-14 MED ORDER — ACETAMINOPHEN 325 MG PO TABS: 650.0000 mg | ORAL_TABLET | Freq: Four times a day (QID) | ORAL | Status: DC | PRN

## 2024-01-14 NOTE — ED Triage Notes (Signed)
 Pt BIB [facility] as a Code Stroke. LKW 11am. She lives at [facility].  She had gone to [facility] and began to have weakness in left extremities.  EMS was called and endorsed Dysarthria, L.facial droop, and  L. Extr. Weakness.  Pt is on Eliquis .  Pt is a&O x4 GCS 15.  Pt has BL 18G IV's

## 2024-01-14 NOTE — H&P (Addendum)
 History and Physical    Patient: Stacy [name] ZOX:[id number] DOB: [birth year] DOA: 01/14/2024 DOS: the patient was seen and examined on 01/14/2024 PCP: Chares Commons, PA-C  Patient coming from: Home Chief complaint: Left sided weakness.   HPI:  Bronwyn Belasco is a 83 y.o. female with past medical history  of   paroxysmal A-fib on Eliquis , carotid artery stenosis, HLD, HTN, hypothyroidism, and anxiety who presented to the ED for left sided weakness that was sudden acute onset when she was in [place], occurring few minutes prior to arrival patient seen in the emergency room as a code stroke activated with last well-known at 11 AM, patient had left extremity weakness dysarthria left facial droop left extremity weakness.  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  ED Course: Pt in ed at bedside  is alert awake oriented deficits improving per patient. Vital signs in the ED were notable for the following:  Vitals:   01/14/24 1200 01/14/24 1249  BP:  (!) 169/84  Pulse:  74  Temp:  97.8 F (36.6 C)  Resp:  (!) 22  Weight: 57 kg   SpO2:  97%  TempSrc:  Oral  BMI (Calculated): 20.91   >>ED evaluation thus far shows: CMP shows sodium 129 normal kidney function normal LFTs. Magnesium  added on and pending. CBC is within normal limits.  >>While in the ED patient received the following: Medications sodium chloride  flush (NS) 0.9 % injection 3 mL (has no administration in time range) 0.9 %  sodium chloride  infusion ( Intravenous New Bag/Given 01/14/24 1327)  stroke: early stages of recovery book (has no administration in time range) iohexol  (OMNIPAQUE ) 350 MG/ML injection 75 mL (75 mLs Intravenous Contrast Given 01/14/24 1245)   Review of Systems  Neurological:  Positive for  weakness.  All other systems reviewed and are negative.  Past Medical History:  Diagnosis Date   Atrial fibrillation (HCC)    echo 12/23/10 EF= >55%, stress myoview 01/30/11 normal pattern of perfusion in all myocardial regions   Carotid artery stenosis    Per [facility] new patient packet    Chicken pox    Colon polyp    Colon polyp    Dyslipidemia    Heart murmur    Hiatal hernia    Hypertension    Hypothyroidism    Laryngopharyngeal reflux    Osteopenia    Scoliosis    Seasonal allergies    Thyroid  disease    Vitamin D  deficiency    Past Surgical History:  Procedure Laterality Date   BREAST CYST EXCISION Right 1988   ESOPHAGEAL MANOMETRY N/A 08/29/2015   Procedure: ESOPHAGEAL MANOMETRY (EM);  Surgeon: Danette Duos, MD;  Location: [facility];  Service: Gastroenterology;  Laterality: N/A;   EYE SURGERY     Cataract eye surgery-right 06/2014, left 04/2014   ORIF PATELLA Right 07/08/2022   Procedure: OPEN REDUCTION INTERNAL FIXATION (ORIF) PATELLA;  Surgeon: Osa Blase, MD;  Location: [facility];  Service: Orthopedics;  Laterality: Right;   TONSILLECTOMY     Childhood    reports that she has never smoked. She has never used smokeless tobacco. She reports that she does not currently use alcohol after a past usage of about 1.0 standard drink of alcohol per week. She reports that she does not use drugs.  Allergies  Allergen Reactions   Amoxicillin Swelling   Flonase [Fluticasone Propionate] Swelling    Swelling of throat per patient    Sulfa Antibiotics Other (See Comments)    Upset stomach   Sulfasalazine Other (See Comments)    Upset stomach   Family History  Problem Relation Age of Onset   CVA Mother    Hyperlipidemia Mother    Hypertension Mother    Anuerysm Father        brain   Stroke Maternal Grandmother    Hypertension Maternal Grandmother    Hypertension Sister    Scoliosis Sister    Colon cancer Neg Hx    Heart attack Neg Hx     Breast cancer Neg Hx    Prior to Admission medications   Medication Sig Start Date End Date Taking? Authorizing Provider  Cholecalciferol  (VITAMIN D -3) 1000 UNITS CAPS Take 1,000 Units by mouth 2 (two) times daily.     [provider]  Cranberry-Vit C-Lactobacillus (RA CRANBERRY SUPPLEMENTS PO) Take 2 tablets by mouth every evening.    [provider]  diltiazem  (CARTIA  XT) 180 MG 24 hr capsule Take 1 capsule (180 mg total) by mouth at bedtime. Patient taking differently: Take 180 mg by mouth every morning. 03/25/23   Hilty, [name], MD  ELIQUIS  2.5 MG TABS tablet Take 2.5 mg by mouth 2 (two) times daily. 02/26/23   [provider]  levothyroxine  (SYNTHROID ) 125 MCG tablet TAKE 1 TABLET BY MOUTH DAILY BEFORE BREAKFAST. Patient taking differently: Take 125 mcg by mouth daily before breakfast. Take 125 mcg Monday-Saturday and 75 mcg on Sunday 09/16/23   Emilie Harden, MD  LORazepam  (ATIVAN ) 0.5 MG tablet Take 1 tablet (0.5 mg total) by mouth 2 (two) times daily as needed for anxiety. Patient taking differently: Take 0.5 mg by mouth at bedtime. 12/13/20   Mast, Man X, NP  Multiple Vitamin (MULTIVITAMIN) capsule Take 1 capsule by mouth every evening.    [provider]  Multiple Vitamins-Minerals (PRESERVISION AREDS PO) Take 1 tablet by mouth daily.    [provider]  Probiotic CHEW Chew 1 each by mouth daily.    [provider]  Wheat Dextrin (BENEFIBER PO) Take 0.5 Scoops by mouth as needed.    [provider]                                                                                 Vitals:   01/14/24 1200 01/14/24 1249  BP:  (!) 169/84  Pulse:  74  Resp:  (!) 22  Temp:  97.8 F (36.6 C)  TempSrc:  Oral  SpO2:  97%  Weight: 57 kg    Physical Exam Vitals and nursing note reviewed.  Constitutional:      General: She is not in acute distress. HENT:     Head: Normocephalic and atraumatic.     Right Ear: Hearing  normal.     Left Ear: Hearing normal.     Nose: No nasal deformity.     Mouth/Throat:     Lips: Pink.     Mouth: Mucous membranes are moist.     Dentition: Has dentures.  Tongue: No lesions. Tongue does not deviate from midline.     Palate: No mass and lesions.   Eyes:     General: Lids are normal.     Extraocular Movements: Extraocular movements intact.    Cardiovascular:     Rate and Rhythm: Normal rate. Rhythm irregular.     Pulses:          Dorsalis pedis pulses are 2+ on the right side and 2+ on the left side.       Posterior tibial pulses are 2+ on the right side and 2+ on the left side.     Heart sounds: Murmur heard.     Systolic murmur is present with a grade of 3/6.  Pulmonary:     Effort: Pulmonary effort is normal.     Breath sounds: Normal breath sounds.  Abdominal:     General: Bowel sounds are normal. There is no distension.     Palpations: Abdomen is soft. There is no mass.     Tenderness: There is no abdominal tenderness.   Musculoskeletal:     Right lower leg: No edema.     Left lower leg: No edema.   Skin:    General: Skin is warm.   Neurological:     General: No focal deficit present.     Mental Status: She is alert and oriented to person, place, and time.     Cranial Nerves: No cranial nerve deficit, dysarthria or facial asymmetry.     Motor: Weakness present.     Coordination: Coordination normal. Finger-Nose-Finger Test normal.     Comments: Left leg weakness, can't move it side to side or with gravity.  LUE weakness has almost resolved.    Psychiatric:        Speech: Speech normal.     Labs on Admission: I have personally reviewed following labs and imaging studies CBC: Recent Labs  Lab 01/14/24 1224 01/14/24 1226  WBC 5.4  --   NEUTROABS 3.9  --   HGB 13.6 13.6  HCT 38.8 40.0  MCV 93.9  --   PLT 153  --    Basic Metabolic Panel: Recent Labs  Lab 01/14/24 1224 01/14/24 1226  NA 129* 129*  K 3.9 3.7  CL 96* 96*  CO2 23   --   GLUCOSE 113* 115*  BUN 19 20  CREATININE 0.89 0.70  CALCIUM 9.2  --    GFR: Estimated Creatinine Clearance: 47.1 mL/min (by C-G formula based on SCr of 0.7 mg/dL). Liver Function Tests: Recent Labs  Lab 01/14/24 1224  AST 26  ALT 20  ALKPHOS 56  BILITOT 0.9  PROT 6.5  ALBUMIN 3.9   No results for input(s): LIPASE, AMYLASE in the last 168 hours. No results for input(s): AMMONIA in the last 168 hours. Coagulation Profile: Recent Labs  Lab 01/14/24 1224  INR 1.3*   Cardiac Enzymes: No results for input(s): CKTOTAL, CKMB, CKMBINDEX, TROPONINI in the last 168 hours. BNP (last 3 results) No results for input(s): PROBNP in the last 8760 hours. HbA1C: No results for input(s): HGBA1C in the last 72 hours. CBG: Recent Labs  Lab 01/14/24 1220  GLUCAP 120*   Lipid Profile: No results for input(s): CHOL, HDL, LDLCALC, TRIG, CHOLHDL, LDLDIRECT in the last 72 hours. Thyroid  Function Tests: No results for input(s): TSH, T4TOTAL, FREET4, T3FREE, THYROIDAB in the last 72 hours. Anemia Panel: No results for input(s): VITAMINB12, FOLATE, FERRITIN, TIBC, IRON, RETICCTPCT in the last 72 hours. Urine analysis:  Component Value Date/Time   COLORURINE YELLOW 01/02/2024 1437   APPEARANCEUR CLEAR 01/02/2024 1437   LABSPEC 1.020 01/02/2024 1437   PHURINE 6.0 01/02/2024 1437   GLUCOSEU NEGATIVE 01/02/2024 1437   GLUCOSEU NEGATIVE 03/30/2023 1355   HGBUR NEGATIVE 01/02/2024 1437   BILIRUBINUR NEGATIVE 01/02/2024 1437   KETONESUR NEGATIVE 01/02/2024 1437   PROTEINUR NEGATIVE 01/02/2024 1437   UROBILINOGEN 0.2 03/30/2023 1355   NITRITE POSITIVE (A) 01/02/2024 1437   LEUKOCYTESUR NEGATIVE 01/02/2024 1437   Radiological Exams on Admission: CT ANGIO HEAD NECK W WO CM (CODE STROKE) Result Date: 01/14/2024 CLINICAL DATA:  Neuro deficit, acute, stroke suspected. Left-sided weakness. EXAM: CT ANGIOGRAPHY HEAD AND NECK WITH AND  WITHOUT CONTRAST TECHNIQUE: Multidetector CT imaging of the head and neck was performed using the standard protocol during bolus administration of intravenous contrast. Multiplanar CT image reconstructions and MIPs were obtained to evaluate the vascular anatomy. Carotid stenosis measurements (when applicable) are obtained utilizing NASCET criteria, using the distal internal carotid diameter as the denominator. RADIATION DOSE REDUCTION: This exam was performed according to the departmental dose-optimization program which includes automated exposure control, adjustment of the mA and/or kV according to patient size and/or use of iterative reconstruction technique. CONTRAST:  75mL OMNIPAQUE  IOHEXOL  350 MG/ML SOLN COMPARISON:  None Available. FINDINGS: CTA NECK FINDINGS Aortic arch: Incomplete imaging of the aortic arch, including the origins of the brachiocephalic and left common carotid arteries. The brachiocephalic and subclavian arteries are patent with calcified plaque at the origin of the right subclavian artery resulting in no more than mild stenosis. Right carotid system: Patent with calcified plaque at the carotid bifurcation resulting in severe stenosis of the origin of the external carotid artery. No evidence of a significant stenosis or dissection of the common carotid or cervical internal carotid arteries. Left carotid system: Patent with calcified plaque at the carotid bifurcation. No evidence of a significant stenosis or dissection. Vertebral arteries: Patent without evidence of a significant stenosis or dissection. Moderately dominant left vertebral artery. Skeleton: Widespread advanced cervical disc degeneration. Potentially moderate spinal stenosis at C4-5. Advanced multilevel neural foraminal stenosis. Other neck: No evidence of cervical lymphadenopathy or mass. Upper chest: Clear lung apices. Review of the MIP images confirms the above findings CTA HEAD FINDINGS Anterior circulation: The internal  carotid arteries are patent from skull base to carotid termini with mild atherosclerotic irregularity bilaterally but no significant stenosis. ACAs and MCAs are patent without evidence of a proximal branch occlusion or significant A1 or left M1 stenosis. There is a severe proximal right M1 stenosis, and mild bilateral MCA and ACA branch vessel irregularity is noted. No aneurysm is identified. Posterior circulation: The intracranial vertebral arteries are widely patent to the basilar. Patent AICA and SCA origins are visualized bilaterally. The basilar artery is widely patent. Posterior communicating arteries are diminutive or absent. Both PCAs are patent. There are moderate proximal left P2 and severe distal right P2 stenoses. No aneurysm is identified. Venous sinuses: As permitted by contrast timing, patent. Anatomic variants: None. Review of the MIP images confirms the above findings These results were communicated to Dr. Christiane Cowing at 12:45 pm on 01/14/2024 by text page via the [facility] messaging system. IMPRESSION: 1. No large vessel occlusion. 2. Intracranial atherosclerosis including severe right M1 and right P2 stenoses. 3. Cervical carotid atherosclerosis without a significant stenosis of the common carotid or internal carotid arteries. 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 Electronically Signed   By: Aundra Lee M.D.   On: 01/14/2024 13:02  CT HEAD CODE STROKE WO CONTRAST Result Date: 01/14/2024 CLINICAL DATA:  Code stroke. Neuro deficit, acute, stroke suspected. Left-sided weakness. EXAM: CT HEAD WITHOUT CONTRAST TECHNIQUE: Contiguous axial images were obtained from the base of the skull through the vertex without intravenous contrast. RADIATION DOSE REDUCTION: This exam was performed according to the departmental dose-optimization program which includes automated exposure control, adjustment of the mA and/or kV according to patient size and/or use of iterative reconstruction technique. COMPARISON:  None  Available. FINDINGS: Brain: There is no evidence of an acute infarct, intracranial hemorrhage, mass, midline shift, or extra-axial fluid collection. Cerebral volume is within normal limits for age. Patchy hypodensities in the cerebral white matter bilaterally are nonspecific but compatible with mild chronic small vessel ischemic disease. Vascular: Calcified atherosclerosis at the skull base. No hyperdense vessel. Skull: No fracture or suspicious lesion. Hyperostosis frontalis interna. Sinuses/Orbits: Visualized paranasal sinuses and mastoid air cells are clear. Bilateral cataract extraction. Other: None. ASPECTS [facility] Stroke Program Early CT Score) - Ganglionic level infarction (caudate, lentiform nuclei, internal capsule, insula, M1-M3 cortex): 7 - Supraganglionic infarction (M4-M6 cortex): 3 Total score (0-10 with 10 being normal): 10 IMPRESSION: 1. No evidence of acute intracranial abnormality. ASPECTS of 10. 2. Mild chronic small vessel ischemic disease. These results were communicated to Dr. Christiane Cowing at 12:45 pm on 01/14/2024 by text page via the [facility] messaging system. Electronically Signed   By: Aundra Lee M.D.   On: 01/14/2024 12:46   Data Reviewed: Relevant notes from primary care and specialist visits, past discharge summaries as available in EHR, including Care Everywhere . Prior diagnostic testing as pertinent to current admission diagnoses, Updated medications and problem lists for reconciliation .ED course, including vitals, labs, imaging, treatment and response to treatment,Triage notes, nursing and pharmacy notes and ED provider's notes.Notable results as noted in HPI.Discussed case with EDMD/ ED APP/ or Specialty MD on call and as needed.  Assessment & Plan  >> Left sided weakness: Pt had left Upper and left lower weakness.  Pt admitted for stroke workup.  MRI brain.  2 d echo with bubble study.  AC per neuro and permissive Htn with SBP goal of upto 180s.     >>PAF: SR on EKG.   CHA2DS2/VAS Stroke Risk Points  Current as of 4 minutes ago     4 >= 2 Points: High Risk  1 to 1.99 Points: Medium Risk  0 Points: Low Risk    No Change   Blood pressure: is less than 180 .  Monitor HR and resume treatment. Hold eliquis  until cleared by neuro.    >>Hypothyroidism: Cont levothyroxine .    >> Essential HTN: We will hold diltiazem      DVT prophylaxis:  Scd's  Consults:  Neuro.  Advance Care Planning:    Code Status: Prior   Family Communication:  None.  Disposition Plan:  Home.  Severity of Illness: The appropriate patient status for this patient is OBSERVATION. Observation status is judged to be reasonable and necessary in order to provide the required intensity of service to ensure the patient's safety. The patient's presenting symptoms, physical exam findings, and initial radiographic and laboratory data in the context of their medical condition is felt to place them at decreased risk for further clinical deterioration. Furthermore, it is anticipated that the patient will be medically stable for discharge from the hospital within 2 midnights of admission.   Unresulted Labs (From admission, onward)     Start     Ordered   01/15/24 0500  Lipid panel  (Labs)  Tomorrow morning,   R       Comments: Fasting    01/14/24 1328   01/15/24 0500  Hemoglobin A1c  (Labs)  Tomorrow morning,   R       Comments: To assess prior glycemic control    01/14/24 1328   01/14/24 1358  Magnesium   Add-on,   AD        01/14/24 1401            Meds ordered this encounter  Medications   sodium chloride  flush (NS) 0.9 % injection 3 mL   iohexol  (OMNIPAQUE ) 350 MG/ML injection 75 mL   0.9 %  sodium chloride  infusion    stroke: early stages of recovery book     Orders Placed This Encounter  Procedures   CT HEAD CODE STROKE WO CONTRAST   CT ANGIO HEAD NECK W WO CM (CODE STROKE)   MR BRAIN WO CONTRAST   Protime-INR   APTT   CBC   Differential   Comprehensive  metabolic panel   Ethanol   Lipid panel   Hemoglobin A1c   Magnesium    Diet NPO time specified   ED Cardiac monitoring   NIH Stroke Scale   Saline Lock IV, Maintain IV access   If O2 sat <94% Administer O2 @ 2 Liters/Minute   NIHSS score documentation NIHSS score range: 0-42   Vital signs   Notify physician (specify)   Swallow screen - If patient does NOT pass this screen, place order for SLP eval and treat (SLP2) - swallowing evaluation (BSE, MBS and/or diet order as indicated)   RN to notify Physician for appropriate diet after patient passes swallow screen   NIH Stroke Scale   Intake and output   Apply Stroke Care Plan: Ischemic Stroke, TIA   Initiate Adult Central Line Maintenance and Catheter Protocol for patients with central line (CVC, PICC, Port, Hemodialysis, Trialysis)   Discuss with patient and document patient's goals for stroke risk factor reduction   Initiate Oral Care Protocol   Initiate Carrier Fluid Protocol   Provide stroke education material to patient and family   Nurse to provide smoking / tobacco cessation education   Cardiac Monitoring Continuous x 48 hours Indications for use: Acute neurological event   Clerk to Activate Code Stroke   Consult to hospitalist   OT eval and treat   PT eval and treat   ED Pulse oximetry, continuous   Oxygen therapy Mode or (Route): Nasal cannula; Liters Per Minute: 2; Keep O2 saturation between: greater than 94 %   SLP eval and treat Reason for evaluation: Cognitive/Language evaluation   CBG monitoring, ED   I-stat chem 8, ED   CBG monitoring, ED   EKG 12-Lead   ECHOCARDIOGRAM COMPLETE   Fall precautions    Author: Lavanda Porter, MD 12 pm- 8 pm. [facility]. 01/14/2024 2:09 PM >>Please note for any concern,or critical results after hours past 8pm please contact the [facility] floor coverage provider from 7 PM- 7 AM. For on call review [email or web address], username [email or web address] and PW: your phone number<<

## 2024-01-14 NOTE — Plan of Care (Signed)
 Discussed with neurologist Dr. Tona Francis about the MRI brain findings.  At this time neurologist recommended holding off Eliquis  and keep patient on aspirin.  Patient updated about the plan.  Melford Spotted

## 2024-01-14 NOTE — ED Provider Notes (Signed)
 [place] EMERGENCY DEPARTMENT AT [facility] Provider Note   CSN: [id number] Arrival date & time: 01/14/24  1217     Patient presents with: No chief complaint on file.   Stacy [name] is a 83 y.o. female.   83 yo F with a cc of L sided weakness.  Arrived as a code stroke.  Level V caveat acuity of condition.         Prior to Admission medications   Medication Sig Start Date End Date Taking? Authorizing Provider  Cholecalciferol  (VITAMIN D -3) 1000 UNITS CAPS Take 1,000 Units by mouth 2 (two) times daily.     [provider]  Cranberry-Vit C-Lactobacillus (RA CRANBERRY SUPPLEMENTS PO) Take 2 tablets by mouth every evening.    [provider]  diltiazem  (CARTIA  XT) 180 MG 24 hr capsule Take 1 capsule (180 mg total) by mouth at bedtime. Patient taking differently: Take 180 mg by mouth every morning. 03/25/23   Hilty, [name], MD  ELIQUIS  2.5 MG TABS tablet Take 2.5 mg by mouth 2 (two) times daily. 02/26/23   [provider]  levothyroxine  (SYNTHROID ) 125 MCG tablet TAKE 1 TABLET BY MOUTH DAILY BEFORE BREAKFAST. Patient taking differently: Take 125 mcg by mouth daily before breakfast. Take 125 mcg Monday-Saturday and 75 mcg on Sunday 09/16/23   Emilie Harden, MD  LORazepam  (ATIVAN ) 0.5 MG tablet Take 1 tablet (0.5 mg total) by mouth 2 (two) times daily as needed for anxiety. Patient taking differently: Take 0.5 mg by mouth at bedtime. 12/13/20   Mast, Man X, NP  Multiple Vitamin (MULTIVITAMIN) capsule Take 1 capsule by mouth every evening.    [provider]  Multiple Vitamins-Minerals (PRESERVISION AREDS PO) Take 1 tablet by mouth daily.    [provider]  Probiotic CHEW Chew 1 each by mouth daily.    [provider]  Wheat Dextrin (BENEFIBER PO) Take 0.5 Scoops by mouth as needed.    [provider]    Allergies: Amoxicillin, Flonase [fluticasone propionate], Sulfa antibiotics, and Sulfasalazine     Review of Systems  Updated Vital Signs BP (!) 169/84 (BP Location: Left Arm)   Pulse 74   Temp 97.8 F (36.6 C) (Oral)   Resp (!) 22   Wt 57 kg   SpO2 97%   BMI 20.91 kg/m   Physical Exam Vitals and nursing note reviewed.  Constitutional:      General: She is not in acute distress.    Appearance: She is well-developed. She is not diaphoretic.  HENT:     Head: Normocephalic and atraumatic.   Eyes:     Pupils: Pupils are equal, round, and reactive to light.    Cardiovascular:     Rate and Rhythm: Normal rate and regular rhythm.     Heart sounds: No murmur heard.    No friction rub. No gallop.  Pulmonary:     Effort: Pulmonary effort is normal.     Breath sounds: No wheezing or rales.  Abdominal:     General: There is no distension.     Palpations: Abdomen is soft.     Tenderness: There is no abdominal tenderness.   Musculoskeletal:        General: No tenderness.     Cervical back: Normal range of motion and neck supple.   Skin:    General: Skin is warm and dry.   Neurological:     Mental Status: She is alert and oriented to person, place, and time.  Psychiatric:        Behavior: Behavior normal.     (all labs ordered are listed, but only abnormal results are displayed) Labs Reviewed  PROTIME-INR - Abnormal; Notable for the following components:      Result Value   Prothrombin Time 16.2 (*)    INR 1.3 (*)    All other components within normal limits  COMPREHENSIVE METABOLIC PANEL WITH GFR - Abnormal; Notable for the following components:   Sodium 129 (*)    Chloride 96 (*)    Glucose, Bld 113 (*)    All other components within normal limits  CBG MONITORING, ED - Abnormal; Notable for the following components:   Glucose-Capillary 120 (*)    All other components within normal limits  I-STAT CHEM 8, ED - Abnormal; Notable for the following components:   Sodium 129 (*)    Chloride 96 (*)    Glucose, Bld 115 (*)    Calcium, Ion 1.09 (*)    All  other components within normal limits  APTT  CBC  DIFFERENTIAL  ETHANOL  MAGNESIUM   TSH  T4, FREE  CBG MONITORING, ED    EKG: EKG Interpretation Date/Time:  Thursday January 14 2024 12:47:46 EDT Ventricular Rate:  71 PR Interval:  205 QRS Duration:  103 QT Interval:  471 QTC Calculation: 512 R Axis:   -59  Text Interpretation: Sinus or ectopic atrial rhythm Left anterior fascicular block Prolonged QT interval No significant change since last tracing Confirmed by Albertus Hughs [phone]) on 01/14/2024 1:44:05 PM  Radiology: CT ANGIO HEAD NECK W WO CM (CODE STROKE) Result Date: 01/14/2024 CLINICAL DATA:  Neuro deficit, acute, stroke suspected. Left-sided weakness. EXAM: CT ANGIOGRAPHY HEAD AND NECK WITH AND WITHOUT CONTRAST TECHNIQUE: Multidetector CT imaging of the head and neck was performed using the standard protocol during bolus administration of intravenous contrast. Multiplanar CT image reconstructions and MIPs were obtained to evaluate the vascular anatomy. Carotid stenosis measurements (when applicable) are obtained utilizing NASCET criteria, using the distal internal carotid diameter as the denominator. RADIATION DOSE REDUCTION: This exam was performed according to the departmental dose-optimization program which includes automated exposure control, adjustment of the mA and/or kV according to patient size and/or use of iterative reconstruction technique. CONTRAST:  75mL OMNIPAQUE  IOHEXOL  350 MG/ML SOLN COMPARISON:  None Available. FINDINGS: CTA NECK FINDINGS Aortic arch: Incomplete imaging of the aortic arch, including the origins of the brachiocephalic and left common carotid arteries. The brachiocephalic and subclavian arteries are patent with calcified plaque at the origin of the right subclavian artery resulting in no more than mild stenosis. Right carotid system: Patent with calcified plaque at the carotid bifurcation resulting in severe stenosis of the origin of the external carotid  artery. No evidence of a significant stenosis or dissection of the common carotid or cervical internal carotid arteries. Left carotid system: Patent with calcified plaque at the carotid bifurcation. No evidence of a significant stenosis or dissection. Vertebral arteries: Patent without evidence of a significant stenosis or dissection. Moderately dominant left vertebral artery. Skeleton: Widespread advanced cervical disc degeneration. Potentially moderate spinal stenosis at C4-5. Advanced multilevel neural foraminal stenosis. Other neck: No evidence of cervical lymphadenopathy or mass. Upper chest: Clear lung apices. Review of the MIP images confirms the above findings CTA HEAD FINDINGS Anterior circulation: The internal carotid arteries are patent from skull base to carotid termini with mild atherosclerotic irregularity bilaterally but no significant stenosis. ACAs and MCAs are patent without evidence of a proximal branch occlusion or  significant A1 or left M1 stenosis. There is a severe proximal right M1 stenosis, and mild bilateral MCA and ACA branch vessel irregularity is noted. No aneurysm is identified. Posterior circulation: The intracranial vertebral arteries are widely patent to the basilar. Patent AICA and SCA origins are visualized bilaterally. The basilar artery is widely patent. Posterior communicating arteries are diminutive or absent. Both PCAs are patent. There are moderate proximal left P2 and severe distal right P2 stenoses. No aneurysm is identified. Venous sinuses: As permitted by contrast timing, patent. Anatomic variants: None. Review of the MIP images confirms the above findings These results were communicated to Dr. Christiane Cowing at 12:45 pm on 01/14/2024 by text page via the [facility] messaging system. IMPRESSION: 1. No large vessel occlusion. 2. Intracranial atherosclerosis including severe right M1 and right P2 stenoses. 3. Cervical carotid atherosclerosis without a significant stenosis of the common  carotid or internal carotid arteries. 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 Electronically Signed   By: Aundra Lee M.D.   On: 01/14/2024 13:02   CT HEAD CODE STROKE WO CONTRAST Result Date: 01/14/2024 CLINICAL DATA:  Code stroke. Neuro deficit, acute, stroke suspected. Left-sided weakness. EXAM: CT HEAD WITHOUT CONTRAST TECHNIQUE: Contiguous axial images were obtained from the base of the skull through the vertex without intravenous contrast. RADIATION DOSE REDUCTION: This exam was performed according to the departmental dose-optimization program which includes automated exposure control, adjustment of the mA and/or kV according to patient size and/or use of iterative reconstruction technique. COMPARISON:  None Available. FINDINGS: Brain: There is no evidence of an acute infarct, intracranial hemorrhage, mass, midline shift, or extra-axial fluid collection. Cerebral volume is within normal limits for age. Patchy hypodensities in the cerebral white matter bilaterally are nonspecific but compatible with mild chronic small vessel ischemic disease. Vascular: Calcified atherosclerosis at the skull base. No hyperdense vessel. Skull: No fracture or suspicious lesion. Hyperostosis frontalis interna. Sinuses/Orbits: Visualized paranasal sinuses and mastoid air cells are clear. Bilateral cataract extraction. Other: None. ASPECTS [facility] Stroke Program Early CT Score) - Ganglionic level infarction (caudate, lentiform nuclei, internal capsule, insula, M1-M3 cortex): 7 - Supraganglionic infarction (M4-M6 cortex): 3 Total score (0-10 with 10 being normal): 10 IMPRESSION: 1. No evidence of acute intracranial abnormality. ASPECTS of 10. 2. Mild chronic small vessel ischemic disease. These results were communicated to Dr. Christiane Cowing at 12:45 pm on 01/14/2024 by text page via the [facility] messaging system. Electronically Signed   By: Aundra Lee M.D.   On: 01/14/2024 12:46     Procedures   Medications Ordered in the ED  sodium  chloride flush (NS) 0.9 % injection 3 mL (has no administration in time range)  0.9 %  sodium chloride  infusion ( Intravenous New Bag/Given 01/14/24 1327)   stroke: early stages of recovery book (has no administration in time range)  acetaminophen  (TYLENOL ) tablet 650 mg (has no administration in time range)    Or  acetaminophen  (TYLENOL ) suppository 650 mg (has no administration in time range)  iohexol  (OMNIPAQUE ) 350 MG/ML injection 75 mL (75 mLs Intravenous Contrast Given 01/14/24 1245)                                    Medical Decision Making Amount and/or Complexity of Data Reviewed Labs: ordered. Radiology: ordered.  Risk Decision regarding hospitalization.   83 yo F with a cc of L sided weaknes. Started just prior to arrival.  Airway cleared at  the bridge and taken to CT.   Patient thought to have a stroke by neurology.  Not a candidate for thrombolytics being on Eliquis .  No LVO.  Plan for medical admission.  On my repeat assessment the patient has had what seems like significant improvement of her weakness.  I do not appreciate any obvious deficit.  Discussed with hospitalist for admission. Hyponatremia hypochloremia.  The patients results and plan were reviewed and discussed.   Any x-rays performed were independently reviewed by myself.   Differential diagnosis were considered with the presenting HPI.  Medications  sodium chloride  flush (NS) 0.9 % injection 3 mL (has no administration in time range)  0.9 %  sodium chloride  infusion ( Intravenous New Bag/Given 01/14/24 1327)   stroke: early stages of recovery book (has no administration in time range)  acetaminophen  (TYLENOL ) tablet 650 mg (has no administration in time range)    Or  acetaminophen  (TYLENOL ) suppository 650 mg (has no administration in time range)  iohexol  (OMNIPAQUE ) 350 MG/ML injection 75 mL (75 mLs Intravenous Contrast Given 01/14/24 1245)    Vitals:   01/14/24 1200 01/14/24 1249  BP:  (!) 169/84   Pulse:  74  Resp:  (!) 22  Temp:  97.8 F (36.6 C)  TempSrc:  Oral  SpO2:  97%  Weight: 57 kg     Final diagnoses:  Acute ischemic stroke [facility])    Admission/ observation were discussed with the admitting physician, patient and/or family and they are comfortable with the plan.          Final diagnoses:  Acute ischemic stroke [facility])    ED Discharge Orders     None          Albertus Hughs, DO 01/14/24 1422

## 2024-01-14 NOTE — ED Notes (Signed)
 IP unit notified. Transport acknowledged

## 2024-01-14 NOTE — ED Notes (Signed)
 CCMD notified via telephone.

## 2024-01-14 NOTE — ED Provider Notes (Signed)
 83 year old female presents today with left-sided weakness.  Sudden onset occurred just prior to arrival.  She was initiated as code stroke.  Patient seen and evaluated at bridge with neurologist.  She is awake and alert and able to speak. Patient is cleared to go to CT scanner   Auston Blush, MD 01/14/24 1222

## 2024-01-14 NOTE — Consult Note (Signed)
 Stroke Neurology Consultation Note  Consult Requested by: Dr Inga Manges  Reason for Consult: left sided weakness  Consult Date: 01/14/24   The history was obtained from the pt and EMS.  During history and examination, all items were able to obtain unless otherwise noted.  History of Present Illness:  Stacy [name] is a 83 y.o. Caucasian female with PMH of PAF on eliquis  2.5, HLD, HTN, hypothyroidism, and anxiety presented for code stroke. Pt was in [employer] shopping and had sudden onset left sided weakness, mild slurry speech and feeling lightheaded at 11am. EMS called. BP 170/80, glucose 131. Pt takes eliquis  2.5 bid, last dose 8am. In ER, pt left sided weakness improving but still weak, slight left facial droop. CT no acute finding.   Pt had recent admission for palpitation and dizziness, treated for afib related palpitation and LAFB. Had low Na 127 but improved to 132. Discharged with eliquis  2.5 and cardizem . Pt stated that she is compliant with meds.   LSN: 11am  TNK Given: No: on eliquis  IR: no sign of LVO mRS = 2  Past Medical History:  Diagnosis Date   Atrial fibrillation (HCC)    echo 12/23/10 EF= >55%, stress myoview 01/30/11 normal pattern of perfusion in all myocardial regions   Carotid artery stenosis    Per [facility] new patient packet    Chicken pox    Colon polyp    Colon polyp    Dyslipidemia    Heart murmur    Hiatal hernia    Hypertension    Hypothyroidism    Laryngopharyngeal reflux    Osteopenia    Scoliosis    Seasonal allergies    Thyroid  disease    Vitamin D  deficiency     Past Surgical History:  Procedure Laterality Date   BREAST CYST EXCISION Right 1988   ESOPHAGEAL MANOMETRY N/A 08/29/2015   Procedure: ESOPHAGEAL MANOMETRY (EM);  Surgeon: Danette Duos, MD;  Location: [facility];  Service: Gastroenterology;  Laterality: N/A;   EYE SURGERY     Cataract eye surgery-right 06/2014, left 04/2014   ORIF PATELLA Right 07/08/2022   Procedure: OPEN  REDUCTION INTERNAL FIXATION (ORIF) PATELLA;  Surgeon: Osa Blase, MD;  Location: [facility];  Service: Orthopedics;  Laterality: Right;   TONSILLECTOMY     Childhood    Family History  Problem Relation Age of Onset   CVA Mother    Hyperlipidemia Mother    Hypertension Mother    Anuerysm Father        brain   Stroke Maternal Grandmother    Hypertension Maternal Grandmother    Hypertension Sister    Scoliosis Sister    Colon cancer Neg Hx    Heart attack Neg Hx    Breast cancer Neg Hx     Social History:  reports that she has never smoked. She has never used smokeless tobacco. She reports that she does not currently use alcohol after a past usage of about 1.0 standard drink of alcohol per week. She reports that she does not use drugs.  Allergies:  Allergies  Allergen Reactions   Amoxicillin Swelling   Flonase [Fluticasone Propionate] Swelling    Swelling of throat per patient    Sulfa Antibiotics Other (See Comments)    Upset stomach   Sulfasalazine Other (See Comments)    Upset stomach    No current facility-administered medications on file prior to encounter.   Current Outpatient Medications on File Prior to Encounter  Medication Sig Dispense Refill  Cholecalciferol  (VITAMIN D -3) 1000 UNITS CAPS Take 1,000 Units by mouth 2 (two) times daily.      Cranberry-Vit C-Lactobacillus (RA CRANBERRY SUPPLEMENTS PO) Take 2 tablets by mouth every evening.     diltiazem  (CARTIA  XT) 180 MG 24 hr capsule Take 1 capsule (180 mg total) by mouth at bedtime. (Patient taking differently: Take 180 mg by mouth every morning.) 90 capsule 3   ELIQUIS  2.5 MG TABS tablet Take 2.5 mg by mouth 2 (two) times daily.     levothyroxine  (SYNTHROID ) 125 MCG tablet TAKE 1 TABLET BY MOUTH DAILY BEFORE BREAKFAST. (Patient taking differently: Take 125 mcg by mouth daily before breakfast. Take 125 mcg Monday-Saturday and 75 mcg on Sunday) 90 tablet 1   LORazepam  (ATIVAN ) 0.5 MG tablet Take  1 tablet (0.5 mg total) by mouth 2 (two) times daily as needed for anxiety. (Patient taking differently: Take 0.5 mg by mouth at bedtime.) 30 tablet 2   Multiple Vitamin (MULTIVITAMIN) capsule Take 1 capsule by mouth every evening.     Multiple Vitamins-Minerals (PRESERVISION AREDS PO) Take 1 tablet by mouth daily.     Probiotic CHEW Chew 1 each by mouth daily.     Wheat Dextrin (BENEFIBER PO) Take 0.5 Scoops by mouth as needed.      Review of Systems: A full ROS was attempted today and was able to be performed.  Systems assessed include - Constitutional, Eyes, HENT, Respiratory, Cardiovascular, Gastrointestinal, Genitourinary, Integument/breast, Hematologic/lymphatic, Musculoskeletal, Neurological, Behavioral/Psych, Endocrine, Allergic/Immunologic - with pertinent responses as per HPI.  Physical Examination: Weight:  [57 kg] 57 kg (06/12 1200)  General - well nourished, well developed, in no apparent distress.    Ophthalmologic - fundi not visualized due to noncooperation.    Cardiovascular - regular rate and rhythm, not in afib  Mental Status -  Level of arousal and orientation to time, place, and person were intact. Language including expression, naming, repetition, comprehension was assessed and found intact. Fund of Knowledge was assessed and was intact.  Cranial Nerves II - XII - II - Vision intact OU. III, IV, VI - Extraocular movements intact. V - Facial sensation intact bilaterally. VII - mild left nasolabial fold flattening VIII - Hearing & vestibular intact bilaterally. X - Palate elevates symmetrically. XI - Chin turning & shoulder shrug intact bilaterally. XII - Tongue protrusion intact.  Motor Strength - The patient's strength was normal in RUE and RLE but LUE 3/5 proximal with drift and 4/5 hand grip, and LLE 2+/5 proximal and 3+/5 toe movement.   Motor Tone & Bulk - Muscle tone was assessed at the neck and appendages and was normal.  Bulk was normal and  fasciculations were absent.   Reflexes - The patient's reflexes were normal in all extremities and she had no pathological reflexes.  Sensory - Light touch, temperature/pinprick were assessed and were normal.    Coordination - The patient had normal movements in the R hand and foot with no ataxia or dysmetria. L FTN ataxia but not out of proportion to weakness, L HTS not able to perform. Tremor was absent.  Gait and Station - deferred  NIH Stroke Scale  Level Of Consciousness 0=Alert; keenly responsive 1=Arouse to minor stimulation 2=Requires repeated stimulation to arouse or movements to pain 3=postures or unresponsive 0  LOC Questions to Month and Age 52=Answers both questions correctly 1=Answers one question correctly or dysarthria/intubated/trauma/language barrier 2=Answers neither question correctly or aphasia 0  LOC Commands      -Open/Close eyes     -  Open/close grip     -Pantomime commands if communication barrier 0=Performs both tasks correctly 1=Performs one task correctly 2=Performs neighter task correctly 0  Best Gaze     -Only assess horizontal gaze 0=Normal 1=Partial gaze palsy 2=Forced deviation, or total gaze paresis 0  Visual 0=No visual loss 1=Partial hemianopia 2=Complete hemianopia 3=Bilateral hemianopia (blind including cortical blindness) 0  Facial Palsy     -Use grimace if obtunded 0=Normal symmetrical movement 1=Minor paralysis (asymmetry) 2=Partial paralysis (lower face) 3=Complete paralysis (upper and lower face) 1  Motor  0=No drift for 10/5 seconds 1=Drift, but does not hit bed 2=Some antigravity effort, hits  bed 3=No effort against gravity, limb falls 4=No movement 0=Amputation/joint fusion Right Arm 0     Leg 0    Left Arm 1     Leg 3  Limb Ataxia     - FNT/HTS 0=Absent or does not understand or paralyzed or amputation/joint fusion 1=Present in one limb 2=Present in two limbs 0  Sensory 0=Normal 1=Mild to moderate sensory  loss 2=Severe to total sensory loss or coma/unresponsive 0  Best Language 0=No aphasia, normal 1=Mild to moderate aphasia 2=Severe aphasia 3=Mute, global aphasia, or coma/unresponsive 0  Dysarthria 0=Normal 1=Mild to moderate 2=Severe, unintelligible or mute/anarthric 0=intubated/unable to test 1  Extinction/Neglect 0=No abnormality 1=visual/tactile/auditory/spatia/personal inattention/Extinction to bilateral simultaneous stimulation 2=Profound neglect/extinction more than 1 modality  0  Total   6     Data Reviewed: CT ABDOMEN PELVIS W CONTRAST Result Date: 01/02/2024 CLINICAL DATA:  Abdominal pain. Palpitations. Suspected bowel obstruction. EXAM: CT ABDOMEN AND PELVIS WITH CONTRAST TECHNIQUE: Multidetector CT imaging of the abdomen and pelvis was performed using the standard protocol following bolus administration of intravenous contrast. RADIATION DOSE REDUCTION: This exam was performed according to the departmental dose-optimization program which includes automated exposure control, adjustment of the mA and/or kV according to patient size and/or use of iterative reconstruction technique. CONTRAST:  75mL OMNIPAQUE  IOHEXOL  350 MG/ML SOLN COMPARISON:  None Available. FINDINGS: Lower Chest: Mild compressive bibasilar atelectasis and tiny left pleural effusion. Hepatobiliary: No suspicious hepatic masses identified. A few small hepatic cysts are noted. Gallbladder is unremarkable. No evidence of biliary ductal dilatation. Pancreas:  No mass or inflammatory changes. Spleen: Within normal limits in size and appearance. Adrenals/Urinary Tract: No suspicious masses identified. Small benign-appearing bilateral subcapsular renal cysts incidentally noted (No followup imaging is recommended). No evidence of ureteral calculi or hydronephrosis. Unremarkable unopacified urinary bladder. Stomach/Bowel: A large hiatal hernia is seen which contains nearly the entire stomach, transverse colon, and pancreatic  tail. No evidence of bowel obstruction, inflammatory process or abnormal fluid collections. Vascular/Lymphatic: No pathologically enlarged lymph nodes. No acute vascular findings. Reproductive:  No mass or other significant abnormality. Other:  None. Musculoskeletal: No suspicious bone lesions identified. Severe thoracolumbar rotatory levoscoliosis noted. IMPRESSION: No evidence of bowel obstruction or other acute findings. Large hiatal hernia, which contains nearly the entire stomach, transverse colon, and pancreatic tail. Severe thoracolumbar levoscoliosis. Mild bibasilar atelectasis and tiny left pleural effusion. Electronically Signed   By: Marlyce Sine M.D.   On: 01/02/2024 14:30   DG Chest Portable 1 View Result Date: 01/02/2024 CLINICAL DATA:  palpitations EXAM: PORTABLE CHEST 1 VIEW COMPARISON:  April 04, 2023, April 07, 2015 FINDINGS: Evaluation is limited by rotation. Revisualization of a large diaphragmatic hernia. Cardiomediastinal silhouette is grossly unchanged. No pleural effusion or pneumothorax. No acute pleuroparenchymal abnormality. Atherosclerotic calcifications. Severe levo scoliosis of the thoracic spine. IMPRESSION: No acute cardiopulmonary abnormality. Electronically Signed   By:  Clancy Crimes M.D.   On: 01/02/2024 13:18    Assessment: 83 y.o. female with PMH of PAF on eliquis , HLD, HTN, hypothyroidism, and anxiety presented for sudden onset left sided weakness, mild slurry speech and feeling lightheaded at 11am. Pt takes eliquis  2.5 bid, last dose 8am. NIHSS = CT no acute finding. 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 Pt not TNK candidate due to on eliquis , not IR candidate due to no LVO. Pt symptoms likely due to stroke from right M1 high grades stenosis, please keep permissive hypertension, will give gentle hydration. Hold off eliquis  for now until MRI done and decide after.   Stroke Risk Factors - atrial fibrillation, hyperlipidemia, and  hypertension  Plan: Continue further stroke work up  Frequent neuro checks Telemetry monitoring MRI brain  Echocardiogram  Fasting lipid panel and HgbA1C PT/OT/speech consult Permissive hypertension (only treat if BP > 180/105 given on eliquis ) for 24-48 hours post stroke onset Avoid low BP, put on gentle hydration GI and DVT prophylaxis  Hold off eliquis  for now until MRI done to rule out large size of infarct Discussed with Dr. Inga Manges ED physician We will follow   Thank you for this consultation and allowing us  to participate in the care of this patient.  Consuelo Denmark, MD PhD Stroke Neurology 01/14/2024 2:20 PM

## 2024-01-14 NOTE — Code Documentation (Signed)
 Stroke Response Nurse Documentation Code Documentation  Stacy [name] is a 83 y.o. female arriving to [facility]  via [facility] on 01/14/2024 with past medical hx of AF HLD HTN. On Eliquis  (apixaban ) daily. Code stroke was activated by EMS.   Patient from [facility] where she was LKW at 1100  and now complaining of dizziness and left sided weakness .   Stroke team at the bedside on patient arrival. Labs drawn and patient cleared for CT by EDP. Patient to CT with team. NIHSS 5, see documentation for details and code stroke times. Patient with left facial droop, left arm weakness, left leg weakness, and dysarthria  on exam. The following imaging was completed:  CT Head and CTA. Patient is not a candidate for IV Thrombolytic due to Eliquis . Patient is not a candidate for IR due to no LVO on imaging.   Care Plan: VS and NIHSS q 2 x 12, then q 4.    Bedside handoff with ED Medic Dylan  Corrinne Din  Stroke Response RN

## 2024-01-15 DIAGNOSIS — E860 Dehydration: Secondary | ICD-10-CM | POA: Diagnosis not present

## 2024-01-15 DIAGNOSIS — R29810 Facial weakness: Secondary | ICD-10-CM | POA: Diagnosis not present

## 2024-01-15 DIAGNOSIS — R29818 Other symptoms and signs involving the nervous system: Secondary | ICD-10-CM | POA: Diagnosis not present

## 2024-01-15 DIAGNOSIS — I6339 Cerebral infarction due to thrombosis of other cerebral artery: Secondary | ICD-10-CM | POA: Diagnosis not present

## 2024-01-15 DIAGNOSIS — I6389 Other cerebral infarction: Secondary | ICD-10-CM | POA: Diagnosis not present

## 2024-01-15 DIAGNOSIS — I63512 Cerebral infarction due to unspecified occlusion or stenosis of left middle cerebral artery: Secondary | ICD-10-CM | POA: Diagnosis not present

## 2024-01-15 DIAGNOSIS — I672 Cerebral atherosclerosis: Secondary | ICD-10-CM | POA: Diagnosis not present

## 2024-01-15 DIAGNOSIS — I629 Nontraumatic intracranial hemorrhage, unspecified: Secondary | ICD-10-CM | POA: Diagnosis not present

## 2024-01-15 DIAGNOSIS — E871 Hypo-osmolality and hyponatremia: Secondary | ICD-10-CM | POA: Diagnosis not present

## 2024-01-15 DIAGNOSIS — G8194 Hemiplegia, unspecified affecting left nondominant side: Secondary | ICD-10-CM | POA: Diagnosis not present

## 2024-01-15 DIAGNOSIS — R531 Weakness: Secondary | ICD-10-CM | POA: Diagnosis not present

## 2024-01-15 DIAGNOSIS — I639 Cerebral infarction, unspecified: Secondary | ICD-10-CM | POA: Diagnosis not present

## 2024-01-15 DIAGNOSIS — Z823 Family history of stroke: Secondary | ICD-10-CM | POA: Diagnosis not present

## 2024-01-15 DIAGNOSIS — I480 Paroxysmal atrial fibrillation: Secondary | ICD-10-CM | POA: Diagnosis not present

## 2024-01-15 DIAGNOSIS — Z880 Allergy status to penicillin: Secondary | ICD-10-CM | POA: Diagnosis not present

## 2024-01-15 DIAGNOSIS — Z7401 Bed confinement status: Secondary | ICD-10-CM | POA: Diagnosis not present

## 2024-01-15 DIAGNOSIS — E876 Hypokalemia: Secondary | ICD-10-CM | POA: Diagnosis not present

## 2024-01-15 DIAGNOSIS — Z8249 Family history of ischemic heart disease and other diseases of the circulatory system: Secondary | ICD-10-CM | POA: Diagnosis not present

## 2024-01-15 DIAGNOSIS — M6281 Muscle weakness (generalized): Secondary | ICD-10-CM | POA: Diagnosis not present

## 2024-01-15 DIAGNOSIS — Z743 Need for continuous supervision: Secondary | ICD-10-CM | POA: Diagnosis not present

## 2024-01-15 DIAGNOSIS — R21 Rash and other nonspecific skin eruption: Secondary | ICD-10-CM | POA: Diagnosis not present

## 2024-01-15 DIAGNOSIS — Z79890 Hormone replacement therapy (postmenopausal): Secondary | ICD-10-CM | POA: Diagnosis not present

## 2024-01-15 DIAGNOSIS — J302 Other seasonal allergic rhinitis: Secondary | ICD-10-CM | POA: Diagnosis not present

## 2024-01-15 DIAGNOSIS — I618 Other nontraumatic intracerebral hemorrhage: Secondary | ICD-10-CM | POA: Diagnosis not present

## 2024-01-15 DIAGNOSIS — E222 Syndrome of inappropriate secretion of antidiuretic hormone: Secondary | ICD-10-CM | POA: Diagnosis not present

## 2024-01-15 DIAGNOSIS — R29706 NIHSS score 6: Secondary | ICD-10-CM | POA: Diagnosis not present

## 2024-01-15 DIAGNOSIS — E785 Hyperlipidemia, unspecified: Secondary | ICD-10-CM | POA: Diagnosis not present

## 2024-01-15 DIAGNOSIS — K589 Irritable bowel syndrome without diarrhea: Secondary | ICD-10-CM | POA: Diagnosis not present

## 2024-01-15 DIAGNOSIS — Z860100 Personal history of colon polyps, unspecified: Secondary | ICD-10-CM | POA: Diagnosis not present

## 2024-01-15 DIAGNOSIS — Z79899 Other long term (current) drug therapy: Secondary | ICD-10-CM | POA: Diagnosis not present

## 2024-01-15 DIAGNOSIS — E782 Mixed hyperlipidemia: Secondary | ICD-10-CM | POA: Diagnosis not present

## 2024-01-15 DIAGNOSIS — Z882 Allergy status to sulfonamides status: Secondary | ICD-10-CM | POA: Diagnosis not present

## 2024-01-15 DIAGNOSIS — I6523 Occlusion and stenosis of bilateral carotid arteries: Secondary | ICD-10-CM | POA: Diagnosis not present

## 2024-01-15 DIAGNOSIS — F419 Anxiety disorder, unspecified: Secondary | ICD-10-CM | POA: Diagnosis not present

## 2024-01-15 DIAGNOSIS — I6529 Occlusion and stenosis of unspecified carotid artery: Secondary | ICD-10-CM | POA: Diagnosis not present

## 2024-01-15 DIAGNOSIS — M8580 Other specified disorders of bone density and structure, unspecified site: Secondary | ICD-10-CM | POA: Diagnosis not present

## 2024-01-15 DIAGNOSIS — I10 Essential (primary) hypertension: Secondary | ICD-10-CM | POA: Diagnosis not present

## 2024-01-15 DIAGNOSIS — Z7901 Long term (current) use of anticoagulants: Secondary | ICD-10-CM | POA: Diagnosis not present

## 2024-01-15 DIAGNOSIS — I6782 Cerebral ischemia: Secondary | ICD-10-CM | POA: Diagnosis not present

## 2024-01-15 DIAGNOSIS — E039 Hypothyroidism, unspecified: Secondary | ICD-10-CM | POA: Diagnosis present

## 2024-01-15 LAB — CBC
HCT: 39.1 % (ref 36.0–46.0)
Hemoglobin: 13.8 g/dL (ref 12.0–15.0)
MCH: 33 pg (ref 26.0–34.0)
MCHC: 35.3 g/dL (ref 30.0–36.0)
MCV: 93.5 fL (ref 80.0–100.0)
Platelets: 165 10*3/uL (ref 150–400)
RBC: 4.18 MIL/uL (ref 3.87–5.11)
RDW: 12.7 % (ref 11.5–15.5)
WBC: 5.9 10*3/uL (ref 4.0–10.5)
nRBC: 0 % (ref 0.0–0.2)

## 2024-01-15 LAB — LIPID PANEL
Cholesterol: 237 mg/dL — ABNORMAL HIGH (ref 0–200)
HDL: 101 mg/dL (ref >40)
LDL Cholesterol: 128 mg/dL — ABNORMAL HIGH (ref 0–99)
Total CHOL/HDL Ratio: 2.3 ratio
Triglycerides: 42 mg/dL (ref <150)
VLDL: 8 mg/dL (ref 0–40)

## 2024-01-15 LAB — COMPREHENSIVE METABOLIC PANEL WITH GFR
ALT: 22 U/L (ref 0–44)
AST: 34 U/L (ref 15–41)
Albumin: 3.6 g/dL (ref 3.5–5.0)
Alkaline Phosphatase: 68 U/L (ref 38–126)
Anion gap: 13 (ref 5–15)
BUN: 9 mg/dL (ref 8–23)
CO2: 22 mmol/L (ref 22–32)
Calcium: 8.8 mg/dL — ABNORMAL LOW (ref 8.9–10.3)
Chloride: 94 mmol/L — ABNORMAL LOW (ref 98–111)
Creatinine, Ser: 0.54 mg/dL (ref 0.44–1.00)
GFR, Estimated: >60 mL/min (ref >60)
Glucose, Bld: 96 mg/dL (ref 70–99)
Potassium: 3.4 mmol/L — ABNORMAL LOW (ref 3.5–5.1)
Sodium: 129 mmol/L — ABNORMAL LOW (ref 135–145)
Total Bilirubin: 1.1 mg/dL (ref 0.0–1.2)
Total Protein: 6.2 g/dL — ABNORMAL LOW (ref 6.5–8.1)

## 2024-01-15 LAB — HEMOGLOBIN A1C
Hgb A1c MFr Bld: 5 % (ref 4.8–5.6)
Mean Plasma Glucose: 96.8 mg/dL

## 2024-01-15 MED ORDER — ONDANSETRON HCL 4 MG/2ML IJ SOLN
4.0000 mg | Freq: Four times a day (QID) | INTRAMUSCULAR | Status: DC | PRN
  Administered 2024-01-15: 4 mg via INTRAVENOUS

## 2024-01-15 MED ORDER — APIXABAN 2.5 MG PO TABS
2.5000 mg | ORAL_TABLET | Freq: Two times a day (BID) | ORAL | Status: DC
  Administered 2024-01-16 – 2024-01-18 (×5): 2.5 mg via ORAL
  Filled 2024-01-15 (×5): qty 1

## 2024-01-15 MED ORDER — ENOXAPARIN SODIUM 40 MG/0.4ML IJ SOSY: 40.0000 mg | PREFILLED_SYRINGE | INTRAMUSCULAR | Status: DC

## 2024-01-15 MED ORDER — ATORVASTATIN CALCIUM 40 MG PO TABS
40.0000 mg | ORAL_TABLET | Freq: Every day | ORAL | Status: DC
  Administered 2024-01-15 – 2024-01-18 (×4): 40 mg via ORAL
  Filled 2024-01-15 (×4): qty 1

## 2024-01-15 MED ORDER — SODIUM CHLORIDE 0.9 % IV SOLN: INTRAVENOUS | Status: DC

## 2024-01-15 MED ORDER — BISACODYL 5 MG PO TBEC
5.0000 mg | DELAYED_RELEASE_TABLET | Freq: Every day | ORAL | Status: DC | PRN
  Administered 2024-01-15 – 2024-01-16 (×2): 5 mg via ORAL
  Filled 2024-01-15 (×3): qty 1

## 2024-01-15 MED ORDER — MECLIZINE HCL 25 MG PO TABS
12.5000 mg | ORAL_TABLET | Freq: Three times a day (TID) | ORAL | Status: DC | PRN
  Filled 2024-01-15 (×2): qty 1

## 2024-01-15 MED ORDER — POTASSIUM CHLORIDE 20 MEQ PO PACK
40.0000 meq | PACK | Freq: Once | ORAL | Status: AC
  Administered 2024-01-15: 40 meq via ORAL
  Filled 2024-01-15: qty 2

## 2024-01-15 NOTE — Evaluation (Signed)
 Clinical/Bedside Swallow Evaluation Patient Details  Name: Stacy [name] MRN: [id number] Date of Birth: 18-May-1941  Today's Date: 01/15/2024 Time: SLP Start Time (ACUTE ONLY): 1019 SLP Stop Time (ACUTE ONLY): 1027 SLP Time Calculation (min) (ACUTE ONLY): 8 min  Past Medical History:  Past Medical History:  Diagnosis Date   Atrial fibrillation (HCC)    echo 12/23/10 EF= >55%, stress myoview 01/30/11 normal pattern of perfusion in all myocardial regions   Carotid artery stenosis    Per [facility] new patient packet    Chicken pox    Colon polyp    Colon polyp    Dyslipidemia    Heart murmur    Hiatal hernia    Hypertension    Hypothyroidism    Laryngopharyngeal reflux    Osteopenia    Scoliosis    Seasonal allergies    Thyroid  disease    Vitamin D  deficiency    Past Surgical History:  Past Surgical History:  Procedure Laterality Date   BREAST CYST EXCISION Right 1988   ESOPHAGEAL MANOMETRY N/A 08/29/2015   Procedure: ESOPHAGEAL MANOMETRY (EM);  Surgeon: Stacy Duos, MD;  Location: [facility];  Service: Gastroenterology;  Laterality: N/A;   EYE SURGERY     Cataract eye surgery-right 06/2014, left 04/2014   ORIF PATELLA Right 07/08/2022   Procedure: OPEN REDUCTION INTERNAL FIXATION (ORIF) PATELLA;  Surgeon: Stacy Blase, MD;  Location: [facility];  Service: Orthopedics;  Laterality: Right;   TONSILLECTOMY     Childhood   HPI:  Stacy [name] is a 83 y.o. female who presented with L weakness and dizziness. 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 PMHx: AF HLD HTN.    Assessment / Plan / Recommendation  Clinical Impression  Patient presents with clinical swallowing function that is grossly within functional limits. Oral mechanism exam unremarkable. SLP provided patient with regular solids and thin liquids noting no overt difficulty nor s/sx of penetration/aspiration throughout all trials. Patient reports no difficulty swallowing and baseline and  no noticed changes since symptom onset PTA. Recommend continuation of regular/thin liquid diet. Administer medications whole with liquids. No further SLP needs indicated. SLP Visit Diagnosis: Dysphagia, unspecified (R13.10)    Aspiration Risk  No limitations    Diet Recommendation Regular;Thin liquid    Liquid Administration via: Straw;Cup Medication Administration: Whole meds with liquid Supervision: Patient able to self feed Postural Changes: Seated upright at 90 degrees    Other  Recommendations Oral Care Recommendations: Oral care BID     Assistance Recommended at Discharge PRN  Functional Status Assessment Patient has not had a recent decline in their functional status  Frequency and Duration            Prognosis        Swallow Study   General Date of Onset: 01/14/24 [name] who presented with L weakness and dizziness. 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 PMHx: AF HLD HTN. Type of Study: Bedside Swallow Evaluation Previous Swallow Assessment: n/a Diet Prior to this Study: Regular;Thin liquids (Level 0) Temperature Spikes Noted: No Respiratory Status: Room air History of Recent Intubation: No Behavior/Cognition: Alert;Cooperative;Pleasant mood Oral Cavity Assessment: Within Functional Limits Oral Care Completed by SLP: No Oral Cavity - Dentition: Adequate natural dentition Vision: Functional for self-feeding Self-Feeding Abilities: Able to feed self Patient Positioning: Upright in bed Baseline Vocal Quality: Normal Volitional Cough: Strong Volitional Swallow: Able to elicit    Oral/Motor/Sensory Function Overall Oral Motor/Sensory Function: Within functional  limits   Ice Chips Ice chips: Not tested   Thin Liquid Thin Liquid: Within functional limits Presentation: Self Fed;Straw    Nectar Thick Nectar Thick Liquid: Not tested   Honey Thick Honey Thick Liquid: Not tested   Puree Puree: Not tested   Solid      Solid: Within functional limits Presentation: Self Fed     Stacy [name], M.A., CCC-SLP  Stacy [name] 01/15/2024,1:17 PM

## 2024-01-15 NOTE — Progress Notes (Signed)
 PROGRESS NOTE    Stacy [name]  JYN:[id number] DOB: Oct 02, 1940 DOA: 01/14/2024 PCP: Chares Commons, PA-C   Brief Narrative: 83 year old with past medical history significant for paroxysmal A-fib on Eliquis , carotid artery stenosis, hyperlipidemia, hypertension, hypothyroidism, history presented to the ED with left-sided weakness acute onset, code stroke was activated.  Patient was found to have left-sided, dysarthria, left facial droop.  Assessment & Plan:   Principal Problem:   Left-sided weakness   1-Acute CVA, Right [place]. Patient was found to have left-sided, dysarthria, left facial droop. -CT head: No evidence of acute intracranial abnormality.  Mild chronic small vessel ischemic disease - CT angio head and neck: No large vessel occlusion.  Intracranial atherosclerosis supply right M1 and right P2 stenosis.  Cervical carotid atherosclerosis without significant stenosis of the common carotid or internal carotid artery.  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  A small bleed within the lesion which may have reflect developing petechial hemorrhage. Chronic microhemorrhage in the posterior left frontal lobe.  -ECHO:Ef 65 %, No wall motion abnormalities.  -On aspirin . Eliquis  on hold due to micro hemorrhage, follow stroke team recommendations.  -LD 128, A 1c: 5.0 Start Lipitor.   Nausea; suspect related to stroke. PRN Zofran .   Paroxysmal A-fib -Holding Cardizem  to allow permissive HTN, but if HR increase will consider resuming it.  -Holding Eliquis  due to concern for micro-hemorrhage. FU stroke team recommendations.   Hyponatremia:  -Continue with IV fluids.  -Follow labs.   Elevation of Troponin: -Follow ECHO  Hypothyroid -TSH: 5.9 -Continue with Synthroid   Hypertension -Permissive HTN  Hypokalemia; replete orally.   Estimated body mass index is 20.91 kg/m as calculated from the following:   Height as of this  encounter: 5' 5 (1.651 m).   Weight as of this encounter: 57 kg.   DVT prophylaxis: Lovenox  Code Status: Full code Family Communication: care dicussed with patient Disposition Plan:  Status is: Observation The patient remains OBS appropriate and will d/c before 2 midnights.    Consultants:  Neurology   Procedures:  none  Antimicrobials:    Subjective: She is alert, report not feeling well, report nausea, dizziness.  Left side weakness stable.   Objective: Vitals:   01/14/24 1700 01/14/24 2003 01/14/24 2357 01/15/24 0348  BP:  (!) 154/79 (!) 154/68 (!) 156/84  Pulse:  71 66 65  Resp:  18 18 16   Temp:  97.9 F (36.6 C) 97.9 F (36.6 C) 98 F (36.7 C)  TempSrc:  Oral Oral Oral  SpO2:  94% 92% 97%  Weight: 57 kg     Height: 5' 5 (1.651 m)       Intake/Output Summary (Last 24 hours) at 01/15/2024 0703 Last data filed at 01/14/2024 2122 Gross per 24 hour  Intake 240 ml  Output 620 ml  Net -380 ml   Filed Weights   01/14/24 1200 01/14/24 1700  Weight: 57 kg 57 kg    Examination:  General exam: Appears calm and comfortable  Respiratory system: Clear to auscultation. Respiratory effort normal. Cardiovascular system: S1 & S2 heard, RRR. No JVD, murmurs, rubs, gallops or clicks. No pedal edema. Gastrointestinal system: Abdomen is nondistended, soft and nontender. No organomegaly or masses felt. Normal bowel sounds heard. Central nervous system: Alert and oriented. Some dysarthria, left side 3-4/5  Extremities: No edema    Data Reviewed: I have personally reviewed following labs and imaging studies  CBC: Recent Labs  Lab 01/14/24 1224 01/14/24 1226  WBC 5.4  --   NEUTROABS 3.9  --   HGB 13.6 13.6  HCT 38.8 40.0  MCV 93.9  --   PLT 153  --    Basic Metabolic Panel: Recent Labs  Lab 01/14/24 1224 01/14/24 1226  NA 129* 129*  K 3.9 3.7  CL 96* 96*  CO2 23  --   GLUCOSE 113* 115*  BUN 19 20  CREATININE 0.89 0.70  CALCIUM 9.2  --   MG 1.9  --     GFR: Estimated Creatinine Clearance: 47.1 mL/min (by C-G formula based on SCr of 0.7 mg/dL). Liver Function Tests: Recent Labs  Lab 01/14/24 1224  AST 26  ALT 20  ALKPHOS 56  BILITOT 0.9  PROT 6.5  ALBUMIN 3.9   No results for input(s): LIPASE, AMYLASE in the last 168 hours. No results for input(s): AMMONIA in the last 168 hours. Coagulation Profile: Recent Labs  Lab 01/14/24 1224  INR 1.3*   Cardiac Enzymes: No results for input(s): CKTOTAL, CKMB, CKMBINDEX, TROPONINI in the last 168 hours. BNP (last 3 results) No results for input(s): PROBNP in the last 8760 hours. HbA1C: No results for input(s): HGBA1C in the last 72 hours. CBG: Recent Labs  Lab 01/14/24 1220  GLUCAP 120*   Lipid Profile: No results for input(s): CHOL, HDL, LDLCALC, TRIG, CHOLHDL, LDLDIRECT in the last 72 hours. Thyroid  Function Tests: Recent Labs    01/14/24 1224 01/14/24 1401  TSH  --  5.952*  FREET4 1.38*  --    Anemia Panel: No results for input(s): VITAMINB12, FOLATE, FERRITIN, TIBC, IRON, RETICCTPCT in the last 72 hours. Sepsis Labs: No results for input(s): PROCALCITON, LATICACIDVEN in the last 168 hours.  No results found for this or any previous visit (from the past 240 hours).       Radiology Studies: MR BRAIN WO CONTRAST Result Date: 01/14/2024 CLINICAL DATA:  Stroke follow-up. EXAM: MRI HEAD WITHOUT CONTRAST TECHNIQUE: Multiplanar, multiecho pulse sequences of the brain and surrounding structures were obtained without intravenous contrast. COMPARISON:  CT head and CTA head and neck earlier same day. FINDINGS: Brain: 10 mm focus of restricted diffusion in the right thalamus compatible with acute infarct. Chronic microhemorrhage in the posterior left frontal lobe. Small focus of susceptibility in the right thalamus corresponding to the region of infarct which may reflect petechial hemorrhage. T2/FLAIR hyperintensity in the  periventricular and subcortical white matter. Mild parenchymal volume loss. No edema, mass effect, or midline shift. Posterior fossa is unremarkable. Normal appearance of midline structures. The basilar cisterns are patent. No extra-axial fluid collections. Ventricles: Normal size and configuration of the ventricles. Vascular: Skull base flow voids are visualized. Skull and upper cervical spine: No focal abnormality. Sinuses/Orbits: Bilateral lens replacement. Paranasal sinuses are clear. Other: Mastoid air cells are clear. IMPRESSION: Acute infarct in the right thalamus. Small focus of susceptibility within this region which may reflect developing petechial hemorrhage. Recommend short interval follow-up head CT. No significant edema or midline shift. Mild chronic microvascular ischemic changes and mild parenchymal volume loss. Chronic microhemorrhage in the posterior left frontal lobe. Electronically Signed   By: Denny Flack M.D.   On: 01/14/2024 20:21   ECHOCARDIOGRAM COMPLETE Result Date: 01/14/2024    ECHOCARDIOGRAM REPORT   Patient Name:   Joyceann Kruser Date of Exam: 01/14/2024 Medical Rec #:  [id number]       Height:       65.0 in Accession #:    [id number]      Weight:  125.7 lb Date of Birth:  02/01/1941        BSA:          1.624 m Patient Age:    83 years        BP:           169/84 mmHg Patient Gender: F               HR:           74 bpm. Exam Location:  Inpatient Procedure: 2D Echo, Cardiac Doppler and Color Doppler (Both Spectral and Color            Flow Doppler were utilized during procedure). Indications:    Stroke  History:        Patient has prior history of Echocardiogram examinations, most                 recent 05/11/2013. Aortic Valve Disease, Arrythmias:Atrial                 Fibrillation, Signs/Symptoms:Murmur; Risk Factors:Hypertension.  Sonographer:    Astrid Blamer Referring Phys: Alcide Humble PATEL IMPRESSIONS  1. Left ventricular ejection fraction, by estimation, is 65 to 70%. The left  ventricle has normal function. The left ventricle has no regional wall motion abnormalities. There is mild left ventricular hypertrophy. Left ventricular diastolic function could not be evaluated.  2. Right ventricular systolic function is normal. The right ventricular size is normal.  3. Left atrial size was severely dilated.  4. The mitral valve is abnormal. Trivial mitral valve regurgitation. Mild mitral stenosis. Severe mitral annular calcification.  5. The aortic valve is calcified. There is moderate calcification of the aortic valve. There is moderate thickening of the aortic valve. Aortic valve regurgitation is mild to moderate. Mild aortic valve stenosis. Comparison(s): Prior images unable to be directly viewed, comparison made by report only. Conclusion(s)/Recommendation(s): EF unchanged, mild mitral stenosis, mild aortic stenosis. FINDINGS  Left Ventricle: Left ventricular ejection fraction, by estimation, is 65 to 70%. The left ventricle has normal function. The left ventricle has no regional wall motion abnormalities. The left ventricular internal cavity size was normal in size. There is  mild left ventricular hypertrophy. Left ventricular diastolic function could not be evaluated due to mitral annular calcification (moderate or greater). Left ventricular diastolic function could not be evaluated. Right Ventricle: The right ventricular size is normal. No increase in right ventricular wall thickness. Right ventricular systolic function is normal. Left Atrium: Left atrial size was severely dilated. Right Atrium: Right atrial size was normal in size. Pericardium: There is no evidence of pericardial effusion. Mitral Valve: The mitral valve is abnormal. Severe mitral annular calcification. Trivial mitral valve regurgitation. Mild mitral valve stenosis. MV peak gradient, 16.8 mmHg. The mean mitral valve gradient is 6.0 mmHg. Tricuspid Valve: The tricuspid valve is normal in structure. Tricuspid valve  regurgitation is trivial. No evidence of tricuspid stenosis. Aortic Valve: The aortic valve is calcified. There is moderate calcification of the aortic valve. There is moderate thickening of the aortic valve. Aortic valve regurgitation is mild to moderate. Mild aortic stenosis is present. Aortic valve mean gradient measures 17.0 mmHg. Aortic valve peak gradient measures 36.5 mmHg. Aortic valve area, by VTI measures 1.43 cm. Pulmonic Valve: The pulmonic valve was not well visualized. Pulmonic valve regurgitation is not visualized. No evidence of pulmonic stenosis. Aorta: The aortic root is normal in size and structure. Venous: The inferior vena cava was not well visualized. IAS/Shunts: The atrial septum is  grossly normal.  LEFT VENTRICLE PLAX 2D LVIDd:         3.10 cm   Diastology LVIDs:         1.50 cm   LV e' medial:    5.87 cm/s LV PW:         1.10 cm   LV E/e' medial:  17.9 LV IVS:        1.27 cm   LV e' lateral:   7.72 cm/s LVOT diam:     1.80 cm   LV E/e' lateral: 13.6 LV SV:         96 LV SV Index:   59 LVOT Area:     2.54 cm  RIGHT VENTRICLE RV S prime:     13.30 cm/s TAPSE (M-mode): 2.1 cm LEFT ATRIUM             Index        RIGHT ATRIUM          Index LA Vol (A2C):   64.3 ml 39.60 ml/m  RA Area:     7.20 cm LA Vol (A4C):   85.1 ml 52.41 ml/m  RA Volume:   10.40 ml 6.41 ml/m LA Biplane Vol: 77.8 ml 47.92 ml/m  AORTIC VALVE AV Area (Vmax):    1.39 cm AV Area (Vmean):   1.58 cm AV Area (VTI):     1.43 cm AV Vmax:           302.00 cm/s AV Vmean:          187.000 cm/s AV VTI:            0.669 m AV Peak Grad:      36.5 mmHg AV Mean Grad:      17.0 mmHg LVOT Vmax:         165.00 cm/s LVOT Vmean:        116.000 cm/s LVOT VTI:          0.376 m LVOT/AV VTI ratio: 0.56  AORTA Ao Root diam: 3.00 cm MITRAL VALVE MV Area (PHT): 3.27 cm     SHUNTS MV Area VTI:   2.28 cm     Systemic VTI:  0.38 m MV Peak grad:  16.8 mmHg    Systemic Diam: 1.80 cm MV Mean grad:  6.0 mmHg MV Vmax:       2.05 m/s MV Vmean:       115.0 cm/s MV Decel Time: 232 msec MV E velocity: 105.00 cm/s MV A velocity: 179.00 cm/s MV E/A ratio:  0.59 Sheryle Donning MD Electronically signed by Sheryle Donning MD Signature Date/Time: 01/14/2024/7:40:23 PM    Final    CT ANGIO HEAD NECK W WO CM (CODE STROKE) Result Date: 01/14/2024 CLINICAL DATA:  Neuro deficit, acute, stroke suspected. Left-sided weakness. EXAM: CT ANGIOGRAPHY HEAD AND NECK WITH AND WITHOUT CONTRAST TECHNIQUE: Multidetector CT imaging of the head and neck was performed using the standard protocol during bolus administration of intravenous contrast. Multiplanar CT image reconstructions and MIPs were obtained to evaluate the vascular anatomy. Carotid stenosis measurements (when applicable) are obtained utilizing NASCET criteria, using the distal internal carotid diameter as the denominator. RADIATION DOSE REDUCTION: This exam was performed according to the departmental dose-optimization program which includes automated exposure control, adjustment of the mA and/or kV according to patient size and/or use of iterative reconstruction technique. CONTRAST:  75mL OMNIPAQUE  IOHEXOL  350 MG/ML SOLN COMPARISON:  None Available. FINDINGS: CTA NECK FINDINGS Aortic arch: Incomplete imaging of the aortic arch, including  the origins of the brachiocephalic and left common carotid arteries. The brachiocephalic and subclavian arteries are patent with calcified plaque at the origin of the right subclavian artery resulting in no more than mild stenosis. Right carotid system: Patent with calcified plaque at the carotid bifurcation resulting in severe stenosis of the origin of the external carotid artery. No evidence of a significant stenosis or dissection of the common carotid or cervical internal carotid arteries. Left carotid system: Patent with calcified plaque at the carotid bifurcation. No evidence of a significant stenosis or dissection. Vertebral arteries: Patent without evidence of a  significant stenosis or dissection. Moderately dominant left vertebral artery. Skeleton: Widespread advanced cervical disc degeneration. Potentially moderate spinal stenosis at C4-5. Advanced multilevel neural foraminal stenosis. Other neck: No evidence of cervical lymphadenopathy or mass. Upper chest: Clear lung apices. Review of the MIP images confirms the above findings CTA HEAD FINDINGS Anterior circulation: The internal carotid arteries are patent from skull base to carotid termini with mild atherosclerotic irregularity bilaterally but no significant stenosis. ACAs and MCAs are patent without evidence of a proximal branch occlusion or significant A1 or left M1 stenosis. There is a severe proximal right M1 stenosis, and mild bilateral MCA and ACA branch vessel irregularity is noted. No aneurysm is identified. Posterior circulation: The intracranial vertebral arteries are widely patent to the basilar. Patent AICA and SCA origins are visualized bilaterally. The basilar artery is widely patent. Posterior communicating arteries are diminutive or absent. Both PCAs are patent. There are moderate proximal left P2 and severe distal right P2 stenoses. No aneurysm is identified. Venous sinuses: As permitted by contrast timing, patent. Anatomic variants: None. Review of the MIP images confirms the above findings These results were communicated to Dr. Christiane Cowing at 12:45 pm on 01/14/2024 by text page via the [facility] messaging system. IMPRESSION: 1. No large vessel occlusion. 2. Intracranial atherosclerosis including severe right M1 and right P2 stenoses. 3. Cervical carotid atherosclerosis without a significant stenosis of the common carotid or internal carotid arteries. 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 Electronically Signed   By: Aundra Lee M.D.   On: 01/14/2024 13:02   CT HEAD CODE STROKE WO CONTRAST Result Date: 01/14/2024 CLINICAL DATA:  Code stroke. Neuro deficit, acute, stroke suspected. Left-sided weakness. EXAM:  CT HEAD WITHOUT CONTRAST TECHNIQUE: Contiguous axial images were obtained from the base of the skull through the vertex without intravenous contrast. RADIATION DOSE REDUCTION: This exam was performed according to the departmental dose-optimization program which includes automated exposure control, adjustment of the mA and/or kV according to patient size and/or use of iterative reconstruction technique. COMPARISON:  None Available. FINDINGS: Brain: There is no evidence of an acute infarct, intracranial hemorrhage, mass, midline shift, or extra-axial fluid collection. Cerebral volume is within normal limits for age. Patchy hypodensities in the cerebral white matter bilaterally are nonspecific but compatible with mild chronic small vessel ischemic disease. Vascular: Calcified atherosclerosis at the skull base. No hyperdense vessel. Skull: No fracture or suspicious lesion. Hyperostosis frontalis interna. Sinuses/Orbits: Visualized paranasal sinuses and mastoid air cells are clear. Bilateral cataract extraction. Other: None. ASPECTS [facility] Stroke Program Early CT Score) - Ganglionic level infarction (caudate, lentiform nuclei, internal capsule, insula, M1-M3 cortex): 7 - Supraganglionic infarction (M4-M6 cortex): 3 Total score (0-10 with 10 being normal): 10 IMPRESSION: 1. No evidence of acute intracranial abnormality. ASPECTS of 10. 2. Mild chronic small vessel ischemic disease. These results were communicated to Dr. Christiane Cowing at 12:45 pm on 01/14/2024 by text page via the  [employer] messaging system. Electronically Signed   By: Aundra Lee M.D.   On: 01/14/2024 12:46        Scheduled Meds:   stroke: early stages of recovery book   Does not apply Once   aspirin  EC  81 mg Oral Daily   enoxaparin  (LOVENOX ) injection  40 mg Subcutaneous Q24H   LORazepam   0.5 mg Oral QHS   sodium chloride  flush  3 mL Intravenous Once   Continuous Infusions:  sodium chloride  50 mL/hr at 01/14/24 2156     LOS: 0 days    Time spent:  35 Minutes.     Danette Duos, MD [facility]   If 7PM-7AM, please contact night-coverage [email or web address]  01/15/2024, 7:03 AM

## 2024-01-15 NOTE — Progress Notes (Signed)
 MD at side.  Pt said she just had a pain at the back of her head that is gone now.  MD told her he ordered a stat head CT but pt refused.  Said she feels back to normal now and does not even need the CT.  He said will DC the CT.  Said she feels much better now.  Admits to being very anxious and hits the call button very often and will hit it and if nurse is not in the room within a minute, she hits it again.  Said she feels like everything is an emergency.

## 2024-01-15 NOTE — Evaluation (Signed)
 Physical Therapy Evaluation Patient Details Name: Stacy [name] MRN: [id number] DOB: 07-15-1941 Today's Date: 01/15/2024  History of Present Illness  Stacy [name] is a 83 y.o. female who presented with L weakness and dizziness. 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 PMHx: AF HLD HTN.  Clinical Impression  Pt presents with admitting diagnosis above. Co-treat with OT. Session limited by hypertension. BP: 182/117 supine, BP: 198/132 seated EOB. Pt was able to sit EOB with Min/Mod A. PTA pt reports she was fully independent and living at an ILF where she was still driving. Patient will benefit from continued inpatient follow up therapy, <3 hours/day at facility. PT will continue to follow.          If plan is discharge home, recommend the following: A little help with walking and/or transfers;A little help with bathing/dressing/bathroom;Assistance with cooking/housework;Direct supervision/assist for medications management;Direct supervision/assist for financial management;Assist for transportation;Help with stairs or ramp for entrance   Can travel by private vehicle   No    Equipment Recommendations Rolling walker (2 wheels);Wheelchair (measurements PT);Wheelchair cushion (measurements PT);Hospital bed  Recommendations for Other Services       Functional Status Assessment Patient has had a recent decline in their functional status and demonstrates the ability to make significant improvements in function in a reasonable and predictable amount of time.     Precautions / Restrictions Precautions Precautions: Fall Restrictions Weight Bearing Restrictions Per Provider Order: No      Mobility  Bed Mobility Overal bed mobility: Needs Assistance Bed Mobility: Supine to Sit, Sit to Supine     Supine to sit: Min assist, +2 for physical assistance, +2 for safety/equipment Sit to supine: Mod assist, +2 for physical assistance, +2 for safety/equipment   General bed mobility  comments: educated on hooking method to manage LLE    Transfers                   General transfer comment: deferred due to significant hypertension    Ambulation/Gait                  Stairs            Wheelchair Mobility     Tilt Bed    Modified Rankin (Stroke Patients Only) Modified Rankin (Stroke Patients Only) Pre-Morbid Rankin Score: No symptoms Modified Rankin: Moderate disability     Balance Overall balance assessment: Needs assistance Sitting-balance support: Feet supported Sitting balance-Leahy Scale: Fair Sitting balance - Comments: able to statically sit EOB without overt LOB                                     Pertinent Vitals/Pain Pain Assessment Pain Assessment: No/denies pain    Home Living Family/patient expects to be discharged to:: Private residence Living Arrangements: Alone Available Help at Discharge: Friend(s);Family;Available PRN/intermittently Type of Home: Independent living facility Home Access: Level entry;Elevator       Home Layout: One level Home Equipment: Tub bench;Grab bars - tub/shower;Grab bars - toilet;Hand held shower head      Prior Function Prior Level of Function : Independent/Modified Independent;Driving             Mobility Comments: Ind ADLs Comments: indep, cooks small meals, cleans, drives     Extremity/Trunk Assessment   Upper Extremity Assessment Upper Extremity Assessment: LUE deficits/detail LUE Deficits / Details: gross ROM is WFL, UE is weaker distally. Paraesthesias present throughout  hand and distal forearm. Pt reports normal senation at the shoulder. FM coordination, dexterity and proptiocation are impaired. LUE Sensation: decreased light touch;decreased proprioception LUE Coordination: decreased fine motor;decreased gross motor    Lower Extremity Assessment Lower Extremity Assessment: LLE deficits/detail LLE Deficits / Details: LLE grossly weak. Unable to  formally test due to pt BP and nausea.    Cervical / Trunk Assessment Cervical / Trunk Assessment: Kyphotic;Other exceptions (scoliosis)  Communication   Communication Communication: No apparent difficulties    Cognition Arousal: Alert Behavior During Therapy: WFL for tasks assessed/performed   PT - Cognitive impairments: No apparent impairments                         Following commands: Intact       Cueing Cueing Techniques: Verbal cues     General Comments General comments (skin integrity, edema, etc.): BP: 182/117 supine, BP: 198/132 seated EOB.    Exercises     Assessment/Plan    PT Assessment Patient needs continued PT services  PT Problem List Decreased range of motion;Decreased strength;Decreased balance;Decreased activity tolerance;Decreased mobility;Decreased coordination;Decreased cognition;Decreased knowledge of use of DME;Decreased safety awareness;Decreased knowledge of precautions;Cardiopulmonary status limiting activity       PT Treatment Interventions DME instruction;Stair training;Gait training;Functional mobility training;Therapeutic activities;Therapeutic exercise;Balance training;Neuromuscular re-education;Cognitive remediation;Patient/family education    PT Goals (Current goals can be found in the Care Plan section)  Acute Rehab PT Goals Patient Stated Goal: to go home and drive again. PT Goal Formulation: With patient Time For Goal Achievement: 01/29/24 Potential to Achieve Goals: Good    Frequency Min 3X/week     Co-evaluation PT/OT/SLP Co-Evaluation/Treatment: Yes Reason for Co-Treatment: Complexity of the patient's impairments (multi-system involvement);Necessary to address cognition/behavior during functional activity PT goals addressed during session: Mobility/safety with mobility;Balance;Proper use of DME OT goals addressed during session: ADL's and self-care       AM-PAC PT 6 Clicks Mobility  Outcome Measure Help  needed turning from your back to your side while in a flat bed without using bedrails?: A Little Help needed moving from lying on your back to sitting on the side of a flat bed without using bedrails?: A Little Help needed moving to and from a bed to a chair (including a wheelchair)?: A Lot Help needed standing up from a chair using your arms (e.g., wheelchair or bedside chair)?: A Lot Help needed to walk in hospital room?: Total Help needed climbing 3-5 steps with a railing? : Total 6 Click Score: 12    End of Session Equipment Utilized During Treatment: Gait belt Activity Tolerance: Treatment limited secondary to medical complications (Comment) (BP) Patient left: in bed;with call bell/phone within reach Nurse Communication: Mobility status PT Visit Diagnosis: Other abnormalities of gait and mobility (R26.89)    Time: 1610-9604 PT Time Calculation (min) (ACUTE ONLY): 18 min   Charges:   PT Evaluation $PT Eval Moderate Complexity: 1 Mod   PT General Charges $$ ACUTE PT VISIT: 1 Visit         Rodgers Clack, PT, DPT Acute Rehab Services [phone]   Dulcemaria Bula 01/15/2024, 12:00 PM

## 2024-01-15 NOTE — Evaluation (Signed)
 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 02/22/2020   Glaucoma suspect of both eyes 02/22/2020   Serous retinal detachment, unspecified eye 02/22/2020   Serous retinal detachment of left eye 02/22/2020   Weight loss 02/26/2019   Hypertension 02/26/2019   Degenerative scoliosis 11/30/2018   Carotid artery stenosis, asymptomatic, right 05/31/2018   Abnormal carotid pulse 05/18/2018   Primary insomnia 06/15/2017   Osteoporosis 08/24/2016   Dysphagia    Hiatal hernia 06/08/2015   Aortic insufficiency 05/15/2014   Edema 05/06/2013   Hypothyroidism    Colon polyp    Vitamin D  deficiency    Dyslipidemia    Atrial fibrillation [facility])    Past Medical History:  Past Medical History:  Diagnosis Date   Atrial fibrillation (HCC)    echo 12/23/10 EF= >55%, stress myoview 01/30/11 normal pattern of perfusion in all myocardial regions   Carotid artery stenosis    Per [facility] new patient packet    Chicken pox    Colon polyp    Colon polyp    Dyslipidemia    Heart murmur    Hiatal hernia    Hypertension    Hypothyroidism    Laryngopharyngeal reflux    Osteopenia    Scoliosis    Seasonal allergies    Thyroid  disease    Vitamin D  deficiency    Past Surgical History:  Past Surgical History:  Procedure Laterality Date   BREAST CYST EXCISION Right 1988   ESOPHAGEAL MANOMETRY N/A 08/29/2015   Procedure: ESOPHAGEAL MANOMETRY (EM);  Surgeon: Danette Duos, MD;  Location: [facility];  Service: Gastroenterology;  Laterality: N/A;   EYE SURGERY     Cataract eye surgery-right 06/2014, left 04/2014   ORIF PATELLA Right 07/08/2022   Procedure: OPEN REDUCTION INTERNAL FIXATION (ORIF) PATELLA;  Surgeon: Osa Blase, MD;  Location: [facility];  Service: Orthopedics;  Laterality: Right;   TONSILLECTOMY     Childhood   HPI:  Jaycey Gens is a 83 y.o. female who presented with L weakness and dizziness. 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 PMHx: AF HLD HTN.   Assessment / Plan / Recommendation Clinical Impression  Patient presents with cognitive-linguistic function that is [facility]. Patient provided SLP with complete history of medical information and events leading up to hospitalization independently. She communicates with MD re: status and plans for discharge without assistance. Patient endorses no changes to cognition since stroke and demonstrates strong functional cognitive skills throughout session. No further SLP services indicated at this time, patient in agreement.    SLP Assessment  SLP Recommendation/Assessment: Patient does not need any further Speech Language Pathology Services     Assistance Recommended at Discharge  PRN  Functional Status Assessment Patient has not had a recent decline in their functional status  Frequency and Duration           SLP Evaluation Cognition  Overall Cognitive Status: Within Functional Limits for tasks assessed Arousal/Alertness: Awake/alert Orientation Level: Oriented X4 Year: 2025 Month: June Day of Week: Correct  Attention: Sustained Memory: Appears intact Awareness: Appears intact Problem Solving: Appears intact Executive Function: Sequencing       Comprehension  Auditory Comprehension Overall Auditory Comprehension: Appears within functional limits for tasks assessed    Expression Expression Primary Mode of Expression: Verbal Verbal  Expression Overall Verbal Expression: Appears within functional limits for tasks assessed   Oral / Motor  Motor Speech Overall Motor Speech: Appears within functional limits for tasks assessed            Dorla Gartner, M.A., CCC-SLP  Odilia Bennett A Antwoine Zorn 01/15/2024, 1:08 PM

## 2024-01-15 NOTE — Plan of Care (Signed)
  Problem: Education: Goal: Knowledge of secondary prevention will improve (MUST DOCUMENT ALL) Outcome: Progressing   Problem: Education: Goal: Knowledge of patient specific risk factors will improve (DELETE if not current risk factor) Outcome: Progressing   Problem: Ischemic Stroke/TIA Tissue Perfusion: Goal: Complications of ischemic stroke/TIA will be minimized Outcome: Progressing   Problem: Self-Care: Goal: Ability to participate in self-care as condition permits will improve Outcome: Progressing   Problem: Nutrition: Goal: Risk of aspiration will decrease Outcome: Progressing   Problem: Activity: Goal: Risk for activity intolerance will decrease Outcome: Progressing   Problem: Nutrition: Goal: Adequate nutrition will be maintained Outcome: Progressing   Problem: Coping: Goal: Level of anxiety will decrease Outcome: Progressing

## 2024-01-15 NOTE — Evaluation (Signed)
 Occupational Therapy Evaluation Patient Details Name: Stacy [name] MRN: [id number] DOB: 20-May-1941 Today's Date: 01/15/2024   History of Present Illness   Stacy [name] is a 83 y.o. female who presented with L weakness and dizziness. MRI revealed acute infarct in Stacy right thalamus. PMHx: AF HLD HTN.     Clinical Impressions Stacy [name] was evaluated s/p Stacy above admission list. She lives alone at [facility] and is indep at baseline. Upon evaluation Stacy [name] was limited by generalized weakness, elevated BP, dizziness, nausea, L hemi weakness and paraesthesias and poor activity tolerance. Overall she needed min A to get to Stacy EOB and mod A +2 to get back into bed. Educated on hooking method to manage LLE with limited return demonstration.. Due to Stacy deficits listed below Stacy [name] also needs up to min A for UB ADLs and max/total A for LB ADLs. Evaluation was limited by significant symptomatic hypertension, RN made aware. [name] will benefit from continued acute OT services and skilled inpatient follow up therapy, <3 hours/day.  BP in supine 187/117 (136) BP in sitting 198/132 (152) *therapy discontinued, [name] assisted supine.       If plan is discharge home, recommend Stacy following:   A lot of help with walking and/or transfers;A lot of help with bathing/dressing/bathroom;Assistance with cooking/housework;Assistance with feeding;Direct supervision/assist for medications management;Direct supervision/assist for financial management;Assist for transportation;Help with stairs or ramp for entrance     Functional Status Assessment   Patient has had a recent decline in their functional status and demonstrates Stacy ability to make significant improvements in function in a reasonable and predictable amount of time.     Equipment Recommendations   BSC/3in1;Other (comment) (RW)      Precautions/Restrictions   Precautions Precautions: Fall Restrictions Weight Bearing Restrictions Per  Provider Order: No     Mobility Bed Mobility Overal bed mobility: Needs Assistance Bed Mobility: Supine to Sit, Sit to Supine     Supine to sit: Min assist, +2 for physical assistance, +2 for safety/equipment Sit to supine: Mod assist, +2 for physical assistance, +2 for safety/equipment   General bed mobility comments: educated on hooking method to manage LLE    Transfers                   General transfer comment: deferred due to significant hypertension      Balance Overall balance assessment: Needs assistance Sitting-balance support: Feet supported Sitting balance-Leahy Scale: Fair Sitting balance - Comments: able to statically sit EOB without overt LOB             ADL either performed or assessed with clinical judgement   ADL Overall ADL's : Needs assistance/impaired Eating/Feeding: Set up   Grooming: Set up;Sitting   Upper Body Bathing: Minimal assistance;Sitting   Lower Body Bathing: Maximal assistance;Sit to/from stand   Upper Body Dressing : Minimal assistance;Sitting   Lower Body Dressing: Maximal assistance;Sit to/from stand   Toilet Transfer: Total assistance Toilet Transfer Details (indicate cue type and reason): unable to get OOB on eval Toileting- Clothing Manipulation and Hygiene: Moderate assistance       Functional mobility during ADLs: Minimal assistance (bed level) General ADL Comments: [name] is limited by L hemiparesis, generalized weakness and elevated BP. Unable to stand on evaluation due to significant hypertension     Vision Baseline Vision/History: 0 No visual deficits Vision Assessment?: No apparent visual deficits Additional Comments: [name] repoerts some L visual field changes that have now resolved     Perception Perception:  Not tested       Praxis Praxis: Not tested       Pertinent Vitals/Pain Pain Assessment Pain Assessment: No/denies pain     Extremity/Trunk Assessment Upper Extremity Assessment Upper  Extremity Assessment: LUE deficits/detail;Generalized weakness LUE Deficits / Details: gross ROM is WFL, UE is weaker distally. Paraesthesias present throughout hand and distal forearm. [name] reports normal senation at Stacy shoulder. FM coordination, dexterity and proptiocation are impaired. LUE Sensation: decreased light touch;decreased proprioception LUE Coordination: decreased fine motor;decreased gross motor   Lower Extremity Assessment Lower Extremity Assessment: Defer to [name] evaluation   Cervical / Trunk Assessment Cervical / Trunk Assessment: Kyphotic;Other exceptions (scoliosis)   Communication Communication Communication: No apparent difficulties   Cognition Arousal: Alert Behavior During Therapy: WFL for tasks assessed/performed Cognition: No apparent impairments             Following commands: Intact       Cueing  General Comments   Cueing Techniques: Verbal cues  BP in supine 187/117, BP in sitting 198/132. discontinued session due to hypertension, RN made aware.    Home Living Family/patient expects to be discharged to:: Private residence Living Arrangements: Alone Available Help at Discharge: Friend(s);Family;Available PRN/intermittently Type of Home: Independent living facility Home Access: Level entry;Elevator     Home Layout: One level     Bathroom Shower/Tub: Chief Strategy Officer: Handicapped height Bathroom Accessibility: Yes   Home Equipment: Tub bench;Grab bars - tub/shower;Grab bars - toilet;Hand held shower head          Prior Functioning/Environment Prior Level of Function : Independent/Modified Independent;Driving             Mobility Comments: Ind ADLs Comments: indep, cooks small meals, cleans, drives    OT Problem List: Decreased strength;Decreased range of motion;Decreased activity tolerance;Impaired balance (sitting and/or standing);Decreased safety awareness;Decreased knowledge of use of DME or AE;Decreased  knowledge of precautions   OT Treatment/Interventions: Therapeutic exercise;Self-care/ADL training;DME and/or AE instruction;Therapeutic activities;Patient/family education;Balance training      OT Goals(Current goals can be found in Stacy care plan section)   Acute Rehab OT Goals Patient Stated Goal: to feel better OT Goal Formulation: With patient Time For Goal Achievement: 01/29/24 Potential to Achieve Goals: Good ADL Goals [name] Will Perform Grooming: with modified independence [name] Will Perform Upper Body Dressing: with modified independence [name] Will Perform Lower Body Dressing: with min assist [name] Will Transfer to Toilet: with min assist   OT Frequency:  Min 2X/week    Co-evaluation [name]/OT/SLP Co-Evaluation/Treatment: Yes Reason for Co-Treatment: Complexity of Stacy patient's impairments (multi-system involvement);Necessary to address cognition/behavior during functional activity   OT goals addressed during session: ADL's and self-care      AM-PAC OT 6 Clicks Daily Activity     Outcome Measure Help from another person eating meals?: A Little Help from another person taking care of personal grooming?: A Little Help from another person toileting, which includes using toliet, bedpan, or urinal?: Total Help from another person bathing (including washing, rinsing, drying)?: A Lot Help from another person to put on and taking off regular upper body clothing?: A Little Help from another person to put on and taking off regular lower body clothing?: A Lot 6 Click Score: 14   End of Session Nurse Communication: Mobility status  Activity Tolerance: Treatment limited secondary to medical complications (Comment) (elevated BP) Patient left: in bed;with call bell/phone within reach;with bed alarm set  OT Visit Diagnosis: Unsteadiness on feet (R26.81);Muscle weakness (generalized) (M62.81);Other abnormalities of  gait and mobility (R26.89);Dizziness and giddiness (R42)                Time:  5621-3086 OT Time Calculation (min): 19 min Charges:  OT General Charges $OT Visit: 1 Visit OT Evaluation $OT Eval Moderate Complexity: 1 Mod  Veryl Gottron, OTR/L Acute Rehabilitation Services Office [phone] Secure Chat Communication Preferred   Starla Easterly 01/15/2024, 11:30 AM

## 2024-01-15 NOTE — [place] FL2 (Signed)
 [place]  MEDICAID FL2 LEVEL OF CARE FORM     IDENTIFICATION  Patient Name: Stacy [name] Birthdate: 07-16-1941 Sex: female Admission Date (Current Location): 01/14/2024  [facility] and [place] Number:  Producer, television/film/video and Address:  [facility], [address], [place], [place] [address]      Provider Number: [id number]  Attending Physician Name and Address:  Danette Duos, MD  Relative Name and Phone Number:  Jeffrie Minion Niece [phone]  [phone]    Current Level of Care: Hospital Recommended Level of Care: Skilled Nursing Facility Prior Approval Number:    Date Approved/Denied:   PASRR Number:    Discharge Plan: SNF    Current Diagnoses: Patient Active Problem List   Diagnosis Date Noted   Left-sided weakness 01/14/2024   Dizziness 01/03/2024   Palpitations 01/03/2024   Hyponatremia 01/03/2024   Paroxysmal A-fib (HCC) 01/02/2024   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 04/14/2022   Anxiety 03/08/2020   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 02/22/2020   Glaucoma suspect of both eyes 02/22/2020   Serous retinal detachment, unspecified eye 02/22/2020   Serous retinal detachment of left eye 02/22/2020   Weight loss 02/26/2019   Hypertension 02/26/2019   Degenerative scoliosis 11/30/2018   Carotid artery stenosis, asymptomatic, right 05/31/2018   Abnormal carotid pulse 05/18/2018   Primary insomnia 06/15/2017   Osteoporosis 08/24/2016   Dysphagia    Hiatal hernia 06/08/2015   Aortic insufficiency 05/15/2014   Edema 05/06/2013   Hypothyroidism    Colon polyp    Vitamin D  deficiency    Dyslipidemia    Atrial fibrillation (HCC)     Orientation RESPIRATION BLADDER Height & Weight     Self, Time, Situation, Place  Normal Incontinent Weight: 57 kg Height:  5' 5 (165.1 cm)  BEHAVIORAL SYMPTOMS/MOOD NEUROLOGICAL BOWEL NUTRITION STATUS      Continent Diet  (heart healthy with thin liquids)  AMBULATORY STATUS COMMUNICATION OF NEEDS Skin   Extensive Assist Verbally Normal                       Personal Care Assistance Level of Assistance  Feeding, Dressing, Bathing Bathing Assistance: Maximum assistance Feeding assistance: Limited assistance Dressing Assistance: Maximum assistance     Functional Limitations Info  Sight, Hearing, Speech Sight Info: Adequate Hearing Info: Adequate Speech Info: Impaired (dysarthria)    SPECIAL CARE FACTORS FREQUENCY  PT (By licensed PT), OT (By licensed OT), Speech therapy     PT Frequency: 5x/wk OT Frequency: 5x/wk     Speech Therapy Frequency: 5x/wk      Contractures Contractures Info: Not present    Additional Factors Info  Code Status, Allergies, Psychotropic Code Status Info: Full Allergies Info: Amoxicillin/ Flonase/ sulfa antibiotics/ sulfasalazine Psychotropic Info: ativen 0.5 mg at bedtime         Current Medications (01/15/2024):  This is the current hospital active medication list Current Facility-Administered Medications  Medication Dose Route Frequency Provider Last Rate Last Admin   0.9 %  sodium chloride  infusion   Intravenous Continuous Regalado, Belkys A, MD       acetaminophen  (TYLENOL ) tablet 650 mg  650 mg Oral Q6H PRN Patel, Ekta V, MD       Or   acetaminophen  (TYLENOL ) suppository 650 mg  650 mg Rectal Q6H PRN Patel, Ekta V, MD       aspirin  EC tablet 81 mg  81 mg Oral Daily [place],  Tania Familia, MD   81 mg at 01/15/24 0959   atorvastatin (LIPITOR) tablet 40 mg  40 mg Oral Daily Regalado, Belkys A, MD       enoxaparin  (LOVENOX ) injection 40 mg  40 mg Subcutaneous Q24H Angelene Kelly, MD   40 mg at 01/14/24 2150   hydrALAZINE  (APRESOLINE ) injection 10 mg  10 mg Intravenous Q8H PRN Lavanda Porter, MD       LORazepam  (ATIVAN ) tablet 0.5 mg  0.5 mg Oral QHS Angelene Kelly, MD   0.5 mg at 01/14/24 2254   meclizine (ANTIVERT) tablet 12.5 mg  12.5 mg Oral TID  PRN Regalado, Belkys A, MD       ondansetron  (ZOFRAN ) injection 4 mg  4 mg Intravenous Q6H PRN Regalado, Belkys A, MD   4 mg at 01/15/24 1214   sodium chloride  flush (NS) 0.9 % injection 3 mL  3 mL Intravenous Once Albertus Hughs, DO         Discharge Medications: Please see discharge summary for a list of discharge medications.  Relevant Imaging Results:  Relevant Lab Results:   Additional Information SS#: [id number]  Jonathan Neighbor, RN

## 2024-01-15 NOTE — Progress Notes (Signed)
 STROKE TEAM PROGRESS NOTE   SUBJECTIVE (INTERVAL HISTORY) No family is at the bedside. Pt is sitting in bed for lunch.  She still has left hemiparesis, leg worse than arm.  Still has mild left facial droop.  MRI showed right BG infarct.   OBJECTIVE Temp:  [97.7 F (36.5 C)-98.4 F (36.9 C)] 97.7 F (36.5 C) (06/13 1556) Pulse Rate:  [65-74] 74 (06/13 1556) Cardiac Rhythm: Heart block (06/13 0700) Resp:  [16-20] 16 (06/13 1556) BP: (152-169)/(68-103) 152/103 (06/13 1556) SpO2:  [92 %-97 %] 97 % (06/13 1556)  Recent Labs  Lab 01/14/24 1220  GLUCAP 120*   Recent Labs  Lab 01/14/24 1224 01/14/24 1226 01/15/24 0543  NA 129* 129* 129*  K 3.9 3.7 3.4*  CL 96* 96* 94*  CO2 23  --  22  GLUCOSE 113* 115* 96  BUN 19 20 9   CREATININE 0.89 0.70 0.54  CALCIUM 9.2  --  8.8*  MG 1.9  --   --    Recent Labs  Lab 01/14/24 1224 01/15/24 0543  AST 26 34  ALT 20 22  ALKPHOS 56 68  BILITOT 0.9 1.1  PROT 6.5 6.2*  ALBUMIN 3.9 3.6   Recent Labs  Lab 01/14/24 1224 01/14/24 1226 01/15/24 0543  WBC 5.4  --  5.9  NEUTROABS 3.9  --   --   HGB 13.6 13.6 13.8  HCT 38.8 40.0 39.1  MCV 93.9  --  93.5  PLT 153  --  165   No results for input(s): CKTOTAL, CKMB, CKMBINDEX, TROPONINI in the last 168 hours. Recent Labs    01/14/24 1224  LABPROT 16.2*  INR 1.3*   No results for input(s): COLORURINE, LABSPEC, PHURINE, GLUCOSEU, HGBUR, BILIRUBINUR, KETONESUR, PROTEINUR, UROBILINOGEN, NITRITE, LEUKOCYTESUR in the last 72 hours.  Invalid input(s): APPERANCEUR     Component Value Date/Time   CHOL 237 (H) 01/15/2024 0543   TRIG 42 01/15/2024 0543   HDL 101 01/15/2024 0543   CHOLHDL 2.3 01/15/2024 0543   VLDL 8 01/15/2024 0543   LDLCALC 128 (H) 01/15/2024 0543   LDLCALC 173 (H) 03/12/2020 1424   Lab Results  Component Value Date   HGBA1C 5.0 01/15/2024   No results found for: LABOPIA, COCAINSCRNUR, LABBENZ, AMPHETMU, THCU, LABBARB   Recent Labs  Lab 01/14/24 1224  ETH <15    I have personally reviewed the radiological images below and agree with the radiology interpretations.  MR BRAIN WO CONTRAST Result Date: 01/14/2024 CLINICAL DATA:  Stroke follow-up. EXAM: MRI HEAD WITHOUT CONTRAST TECHNIQUE: Multiplanar, multiecho pulse sequences of the brain and surrounding structures were obtained without intravenous contrast. COMPARISON:  CT head and CTA head and neck earlier same day. FINDINGS: Brain: 10 mm focus of restricted diffusion in the right thalamus compatible with acute infarct. Chronic microhemorrhage in the posterior left frontal lobe. Small focus of susceptibility in the right thalamus corresponding to the region of infarct which may reflect petechial hemorrhage. T2/FLAIR hyperintensity in the periventricular and subcortical white matter. Mild parenchymal volume loss. No edema, mass effect, or midline shift. Posterior fossa is unremarkable. Normal appearance of midline structures. The basilar cisterns are patent. No extra-axial fluid collections. Ventricles: Normal size and configuration of the ventricles. Vascular: Skull base flow voids are visualized. Skull and upper cervical spine: No focal abnormality. Sinuses/Orbits: Bilateral lens replacement. Paranasal sinuses are clear. Other: Mastoid air cells are clear. IMPRESSION: Acute infarct in the right thalamus. Small focus of susceptibility within this region which may reflect developing petechial hemorrhage.  Recommend short interval follow-up head CT. No significant edema or midline shift. Mild chronic microvascular ischemic changes and mild parenchymal volume loss. Chronic microhemorrhage in the posterior left frontal lobe. Electronically Signed   By: Denny Flack M.D.   On: 01/14/2024 20:21   ECHOCARDIOGRAM COMPLETE Result Date: 01/14/2024    ECHOCARDIOGRAM REPORT   Patient Name:   Yoltzin Ransom Date of Exam: 01/14/2024 Medical Rec #:  [id number]       Height:       65.0  in Accession #:    [id number]      Weight:       125.7 lb Date of Birth:  15-May-1941        BSA:          1.624 m Patient Age:    83 years        BP:           169/84 mmHg Patient Gender: F               HR:           74 bpm. Exam Location:  Inpatient Procedure: 2D Echo, Cardiac Doppler and Color Doppler (Both Spectral and Color            Flow Doppler were utilized during procedure). Indications:    Stroke  History:        Patient has prior history of Echocardiogram examinations, most                 recent 05/11/2013. Aortic Valve Disease, Arrythmias:Atrial                 Fibrillation, Signs/Symptoms:Murmur; Risk Factors:Hypertension.  Sonographer:    Astrid Blamer Referring Phys: Alcide Humble PATEL IMPRESSIONS  1. Left ventricular ejection fraction, by estimation, is 65 to 70%. The left ventricle has normal function. The left ventricle has no regional wall motion abnormalities. There is mild left ventricular hypertrophy. Left ventricular diastolic function could not be evaluated.  2. Right ventricular systolic function is normal. The right ventricular size is normal.  3. Left atrial size was severely dilated.  4. The mitral valve is abnormal. Trivial mitral valve regurgitation. Mild mitral stenosis. Severe mitral annular calcification.  5. The aortic valve is calcified. There is moderate calcification of the aortic valve. There is moderate thickening of the aortic valve. Aortic valve regurgitation is mild to moderate. Mild aortic valve stenosis. Comparison(s): Prior images unable to be directly viewed, comparison made by report only. Conclusion(s)/Recommendation(s): EF unchanged, mild mitral stenosis, mild aortic stenosis. FINDINGS  Left Ventricle: Left ventricular ejection fraction, by estimation, is 65 to 70%. The left ventricle has normal function. The left ventricle has no regional wall motion abnormalities. The left ventricular internal cavity size was normal in size. There is  mild left ventricular hypertrophy.  Left ventricular diastolic function could not be evaluated due to mitral annular calcification (moderate or greater). Left ventricular diastolic function could not be evaluated. Right Ventricle: The right ventricular size is normal. No increase in right ventricular wall thickness. Right ventricular systolic function is normal. Left Atrium: Left atrial size was severely dilated. Right Atrium: Right atrial size was normal in size. Pericardium: There is no evidence of pericardial effusion. Mitral Valve: The mitral valve is abnormal. Severe mitral annular calcification. Trivial mitral valve regurgitation. Mild mitral valve stenosis. MV peak gradient, 16.8 mmHg. The mean mitral valve gradient is 6.0 mmHg. Tricuspid Valve: The tricuspid valve is normal in structure. Tricuspid valve regurgitation is trivial.  No evidence of tricuspid stenosis. Aortic Valve: The aortic valve is calcified. There is moderate calcification of the aortic valve. There is moderate thickening of the aortic valve. Aortic valve regurgitation is mild to moderate. Mild aortic stenosis is present. Aortic valve mean gradient measures 17.0 mmHg. Aortic valve peak gradient measures 36.5 mmHg. Aortic valve area, by VTI measures 1.43 cm. Pulmonic Valve: The pulmonic valve was not well visualized. Pulmonic valve regurgitation is not visualized. No evidence of pulmonic stenosis. Aorta: The aortic root is normal in size and structure. Venous: The inferior vena cava was not well visualized. IAS/Shunts: The atrial septum is grossly normal.  LEFT VENTRICLE PLAX 2D LVIDd:         3.10 cm   Diastology LVIDs:         1.50 cm   LV e' medial:    5.87 cm/s LV PW:         1.10 cm   LV E/e' medial:  17.9 LV IVS:        1.27 cm   LV e' lateral:   7.72 cm/s LVOT diam:     1.80 cm   LV E/e' lateral: 13.6 LV SV:         96 LV SV Index:   59 LVOT Area:     2.54 cm  RIGHT VENTRICLE RV S prime:     13.30 cm/s TAPSE (M-mode): 2.1 cm LEFT ATRIUM             Index        RIGHT  ATRIUM          Index LA Vol (A2C):   64.3 ml 39.60 ml/m  RA Area:     7.20 cm LA Vol (A4C):   85.1 ml 52.41 ml/m  RA Volume:   10.40 ml 6.41 ml/m LA Biplane Vol: 77.8 ml 47.92 ml/m  AORTIC VALVE AV Area (Vmax):    1.39 cm AV Area (Vmean):   1.58 cm AV Area (VTI):     1.43 cm AV Vmax:           302.00 cm/s AV Vmean:          187.000 cm/s AV VTI:            0.669 m AV Peak Grad:      36.5 mmHg AV Mean Grad:      17.0 mmHg LVOT Vmax:         165.00 cm/s LVOT Vmean:        116.000 cm/s LVOT VTI:          0.376 m LVOT/AV VTI ratio: 0.56  AORTA Ao Root diam: 3.00 cm MITRAL VALVE MV Area (PHT): 3.27 cm     SHUNTS MV Area VTI:   2.28 cm     Systemic VTI:  0.38 m MV Peak grad:  16.8 mmHg    Systemic Diam: 1.80 cm MV Mean grad:  6.0 mmHg MV Vmax:       2.05 m/s MV Vmean:      115.0 cm/s MV Decel Time: 232 msec MV E velocity: 105.00 cm/s MV A velocity: 179.00 cm/s MV E/A ratio:  0.59 Sheryle Donning MD Electronically signed by Sheryle Donning MD Signature Date/Time: 01/14/2024/7:40:23 PM    Final    CT ANGIO HEAD NECK W WO CM (CODE STROKE) Result Date: 01/14/2024 CLINICAL DATA:  Neuro deficit, acute, stroke suspected. Left-sided weakness. EXAM: CT ANGIOGRAPHY HEAD AND NECK WITH AND WITHOUT CONTRAST TECHNIQUE: Multidetector CT imaging of the  head and neck was performed using the standard protocol during bolus administration of intravenous contrast. Multiplanar CT image reconstructions and MIPs were obtained to evaluate the vascular anatomy. Carotid stenosis measurements (when applicable) are obtained utilizing NASCET criteria, using the distal internal carotid diameter as the denominator. RADIATION DOSE REDUCTION: This exam was performed according to the departmental dose-optimization program which includes automated exposure control, adjustment of the mA and/or kV according to patient size and/or use of iterative reconstruction technique. CONTRAST:  75mL OMNIPAQUE  IOHEXOL  350 MG/ML SOLN COMPARISON:   None Available. FINDINGS: CTA NECK FINDINGS Aortic arch: Incomplete imaging of the aortic arch, including the origins of the brachiocephalic and left common carotid arteries. The brachiocephalic and subclavian arteries are patent with calcified plaque at the origin of the right subclavian artery resulting in no more than mild stenosis. Right carotid system: Patent with calcified plaque at the carotid bifurcation resulting in severe stenosis of the origin of the external carotid artery. No evidence of a significant stenosis or dissection of the common carotid or cervical internal carotid arteries. Left carotid system: Patent with calcified plaque at the carotid bifurcation. No evidence of a significant stenosis or dissection. Vertebral arteries: Patent without evidence of a significant stenosis or dissection. Moderately dominant left vertebral artery. Skeleton: Widespread advanced cervical disc degeneration. Potentially moderate spinal stenosis at C4-5. Advanced multilevel neural foraminal stenosis. Other neck: No evidence of cervical lymphadenopathy or mass. Upper chest: Clear lung apices. Review of the MIP images confirms the above findings CTA HEAD FINDINGS Anterior circulation: The internal carotid arteries are patent from skull base to carotid termini with mild atherosclerotic irregularity bilaterally but no significant stenosis. ACAs and MCAs are patent without evidence of a proximal branch occlusion or significant A1 or left M1 stenosis. There is a severe proximal right M1 stenosis, and mild bilateral MCA and ACA branch vessel irregularity is noted. No aneurysm is identified. Posterior circulation: The intracranial vertebral arteries are widely patent to the basilar. Patent AICA and SCA origins are visualized bilaterally. The basilar artery is widely patent. Posterior communicating arteries are diminutive or absent. Both PCAs are patent. There are moderate proximal left P2 and severe distal right P2 stenoses.  No aneurysm is identified. Venous sinuses: As permitted by contrast timing, patent. Anatomic variants: None. Review of the MIP images confirms the above findings These results were communicated to Dr. Christiane Cowing at 12:45 pm on 01/14/2024 by text page via the [facility] messaging system. IMPRESSION: 1. No large vessel occlusion. 2. Intracranial atherosclerosis including severe right M1 and right P2 stenoses. 3. Cervical carotid atherosclerosis without a significant stenosis of the common carotid or internal carotid arteries. 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 Electronically Signed   By: Aundra Lee M.D.   On: 01/14/2024 13:02   CT HEAD CODE STROKE WO CONTRAST Result Date: 01/14/2024 CLINICAL DATA:  Code stroke. Neuro deficit, acute, stroke suspected. Left-sided weakness. EXAM: CT HEAD WITHOUT CONTRAST TECHNIQUE: Contiguous axial images were obtained from the base of the skull through the vertex without intravenous contrast. RADIATION DOSE REDUCTION: This exam was performed according to the departmental dose-optimization program which includes automated exposure control, adjustment of the mA and/or kV according to patient size and/or use of iterative reconstruction technique. COMPARISON:  None Available. FINDINGS: Brain: There is no evidence of an acute infarct, intracranial hemorrhage, mass, midline shift, or extra-axial fluid collection. Cerebral volume is within normal limits for age. Patchy hypodensities in the cerebral white matter bilaterally are nonspecific but compatible with mild chronic small  vessel ischemic disease. Vascular: Calcified atherosclerosis at the skull base. No hyperdense vessel. Skull: No fracture or suspicious lesion. Hyperostosis frontalis interna. Sinuses/Orbits: Visualized paranasal sinuses and mastoid air cells are clear. Bilateral cataract extraction. Other: None. ASPECTS [facility] Stroke Program Early CT Score) - Ganglionic level infarction (caudate, lentiform nuclei, internal capsule,  insula, M1-M3 cortex): 7 - Supraganglionic infarction (M4-M6 cortex): 3 Total score (0-10 with 10 being normal): 10 IMPRESSION: 1. No evidence of acute intracranial abnormality. ASPECTS of 10. 2. Mild chronic small vessel ischemic disease. These results were communicated to Dr. Christiane Cowing at 12:45 pm on 01/14/2024 by text page via the [facility] messaging system. Electronically Signed   By: Aundra Lee M.D.   On: 01/14/2024 12:46   CT ABDOMEN PELVIS W CONTRAST Result Date: 01/02/2024 CLINICAL DATA:  Abdominal pain. Palpitations. Suspected bowel obstruction. EXAM: CT ABDOMEN AND PELVIS WITH CONTRAST TECHNIQUE: Multidetector CT imaging of the abdomen and pelvis was performed using the standard protocol following bolus administration of intravenous contrast. RADIATION DOSE REDUCTION: This exam was performed according to the departmental dose-optimization program which includes automated exposure control, adjustment of the mA and/or kV according to patient size and/or use of iterative reconstruction technique. CONTRAST:  75mL OMNIPAQUE  IOHEXOL  350 MG/ML SOLN COMPARISON:  None Available. FINDINGS: Lower Chest: Mild compressive bibasilar atelectasis and tiny left pleural effusion. Hepatobiliary: No suspicious hepatic masses identified. A few small hepatic cysts are noted. Gallbladder is unremarkable. No evidence of biliary ductal dilatation. Pancreas:  No mass or inflammatory changes. Spleen: Within normal limits in size and appearance. Adrenals/Urinary Tract: No suspicious masses identified. Small benign-appearing bilateral subcapsular renal cysts incidentally noted (No followup imaging is recommended). No evidence of ureteral calculi or hydronephrosis. Unremarkable unopacified urinary bladder. Stomach/Bowel: A large hiatal hernia is seen which contains nearly the entire stomach, transverse colon, and pancreatic tail. No evidence of bowel obstruction, inflammatory process or abnormal fluid collections. Vascular/Lymphatic: No  pathologically enlarged lymph nodes. No acute vascular findings. Reproductive:  No mass or other significant abnormality. Other:  None. Musculoskeletal: No suspicious bone lesions identified. Severe thoracolumbar rotatory levoscoliosis noted. IMPRESSION: No evidence of bowel obstruction or other acute findings. Large hiatal hernia, which contains nearly the entire stomach, transverse colon, and pancreatic tail. Severe thoracolumbar levoscoliosis. Mild bibasilar atelectasis and tiny left pleural effusion. Electronically Signed   By: Marlyce Sine M.D.   On: 01/02/2024 14:30   DG Chest Portable 1 View Result Date: 01/02/2024 CLINICAL DATA:  palpitations EXAM: PORTABLE CHEST 1 VIEW COMPARISON:  April 04, 2023, April 07, 2015 FINDINGS: Evaluation is limited by rotation. Revisualization of a large diaphragmatic hernia. Cardiomediastinal silhouette is grossly unchanged. No pleural effusion or pneumothorax. No acute pleuroparenchymal abnormality. Atherosclerotic calcifications. Severe levo scoliosis of the thoracic spine. IMPRESSION: No acute cardiopulmonary abnormality. Electronically Signed   By: Clancy Crimes M.D.   On: 01/02/2024 13:18     PHYSICAL EXAM  Temp:  [97.7 F (36.5 C)-98.4 F (36.9 C)] 97.7 F (36.5 C) (06/13 1556) Pulse Rate:  [65-74] 74 (06/13 1556) Resp:  [16-20] 16 (06/13 1556) BP: (152-169)/(68-103) 152/103 (06/13 1556) SpO2:  [92 %-97 %] 97 % (06/13 1556)  General - well nourished, well developed, in no apparent distress.     Ophthalmologic - fundi not visualized due to noncooperation.     Cardiovascular - regular rate and rhythm, not in afib   Mental Status -  Level of arousal and orientation to time, place, and person were intact. Language including expression, naming, repetition, comprehension was assessed and  found intact. Fund of Knowledge was assessed and was intact.   Cranial Nerves II - XII - II - Vision intact OU. III, IV, VI - Extraocular movements  intact. V - Facial sensation intact bilaterally. VII - mild left nasolabial fold flattening VIII - Hearing & vestibular intact bilaterally. X - Palate elevates symmetrically. XI - Chin turning & shoulder shrug intact bilaterally. XII - Tongue protrusion intact.   Motor Strength - The patient's strength was normal in RUE and RLE but LUE 4/5 proximal and distal, and LLE 2+/5 proximal and 3+/5 toe movement.    Motor Tone & Bulk - Muscle tone was assessed at the neck and appendages and was normal.  Bulk was normal and fasciculations were absent.    Reflexes - The patient's reflexes were normal in all extremities and she had no pathological reflexes.   Sensory - Light touch, temperature/pinprick were assessed and were normal.     Coordination - The patient had normal movements in the R hand and foot with no ataxia or dysmetria. L FTN ataxia but not out of proportion to weakness, L HTS not able to perform. Tremor was absent.   Gait and Station - deferred   ASSESSMENT/PLAN Ms. Angelize Ryce is a 83 y.o. female with history of PAF on eliquis , HLD, HTN, hypothyroidism, and anxiety presented for sudden onset left sided weakness, mild slurry speech and feeling lightheaded at 11am. Pt takes eliquis  2.5 bid, last dose 8am. NIHSS =6. CT no acute finding. 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 Pt not TNK candidate due to on eliquis , not IR candidate due to no LVO.    Stroke:  right BG infarct with petechial hemorrhage likely secondary to large vessel disease source given left M1 high-grade stenosis CT no acute finding CT head and neck showed left M1 high-grade stenosis MRI  Acute infarct in the right thalamus. Small focus of susceptibility within this region which may reflect developing petechial hemorrhage.  2D Echo EF 65 to 70% LDL 128 HgbA1c 5.0 Lovenox  for VTE prophylaxis Eliquis  (apixaban ) daily prior to admission, now on Eliquis  (apixaban ) daily and ASA 81.  Continue on  discharge. Patient counseled to be compliant with her antithrombotic medications Ongoing aggressive stroke risk factor management Therapy recommendations: SNF Disposition: Pending  Intracranial stenosis CT head and neck showed left M1 high-grade stenosis Symptomatic, likely cause of current stroke Will start aspirin  on top of Eliquis   PAF On Eliquis  2.5 twice daily at home Rate controlled Continue Eliquis  tomorrow given petechial hemorrhage  Hypertension Stable Avoid low BP due to left M1 high-grade stenosis Long term BP goal normotensive  Hyperlipidemia Home meds: None LDL 128, goal < 70 Now on Lipitor 40 Continue statin at discharge  Other Stroke Risk Factors Advanced age  Other Active Problems Hypothyroidism Anxiety IBS Constipation, put on laxatives  Hospital day # 0  Neurology will sign off. Please call with questions. Pt will follow up with stroke clinic NP at [facility] in about 4 weeks. Thanks for the consult.   Consuelo Denmark, MD PhD Stroke Neurology 01/15/2024 6:12 PM    To contact Stroke Continuity provider, please refer to [email or web address]. After hours, contact General Neurology

## 2024-01-15 NOTE — TOC Initial Note (Addendum)
 Transition of Care [facility]) - Initial/Assessment Note    Patient Details  Name: Stacy [name] MRN: [id number] Date of Birth: 10/25/1940  Transition of Care [facility]) CM/SW Contact:    Jonathan Neighbor, RN Phone Number: 01/15/2024, 11:30 AM  Clinical Narrative:                  Pt is from [facility] ILF where she is in an apartment alone.  She still drives and manages her own medications. Awaiting therapy evals. Pt feels she will need rehab at [facility] prior to returning to her apartment.  TOC following.  1339: SNF recommended for rehab. TOC has completed FL2 and sent to [facility] SNF. [facility] will have a bed for her on Monday. TOC MOA has started insurance for SNF auth.   Expected Discharge Plan: Home w Home Health Services Barriers to Discharge: Continued Medical Work up   Patient Goals and CMS Choice            Expected Discharge Plan and Services   Discharge Planning Services: CM Consult   Living arrangements for the past 2 months: Apartment                                      Prior Living Arrangements/Services Living arrangements for the past 2 months: Apartment Lives with:: Self Patient language and need for interpreter reviewed:: Yes Do you feel safe going back to the place where you live?: Yes          Current home services: DME (shower seat) Criminal Activity/Legal Involvement Pertinent to Current Situation/Hospitalization: No - Comment as needed  Activities of Daily Living   ADL Screening (condition at time of admission) Independently performs ADLs?: Yes (appropriate for developmental age) Is the patient deaf or have difficulty hearing?: No Does the patient have difficulty seeing, even when wearing glasses/contacts?: No Does the patient have difficulty concentrating, remembering, or making decisions?: No  Permission Sought/Granted                  Emotional Assessment Appearance:: Appears stated age Attitude/Demeanor/Rapport:  Engaged Affect (typically observed): Accepting Orientation: : Oriented to Self, Oriented to Place, Oriented to  Time, Oriented to Situation   Psych Involvement: No (comment)  Admission diagnosis:  Acute ischemic stroke (HCC) [I63.9] Left-sided weakness [R53.1] Patient Active Problem List   Diagnosis Date Noted   Left-sided weakness 01/14/2024   Dizziness 01/03/2024   Palpitations 01/03/2024   Hyponatremia 01/03/2024   Paroxysmal A-fib (HCC) 01/02/2024   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 04/14/2022   Anxiety 03/08/2020   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 02/22/2020   Glaucoma suspect of both eyes 02/22/2020   Serous retinal detachment, unspecified eye 02/22/2020   Serous retinal detachment of left eye 02/22/2020   Weight loss 02/26/2019   Hypertension 02/26/2019   Degenerative scoliosis 11/30/2018   Carotid artery stenosis, asymptomatic, right 05/31/2018   Abnormal carotid pulse 05/18/2018   Primary insomnia 06/15/2017   Osteoporosis 08/24/2016   Dysphagia    Hiatal hernia 06/08/2015   Aortic insufficiency 05/15/2014   Edema 05/06/2013   Hypothyroidism    Colon polyp    Vitamin D  deficiency    Dyslipidemia    Atrial fibrillation (HCC)    PCP:  Chares Commons, PA-C Pharmacy:   [id number] - [place], [place] - [address] [place] [address] Phone: [phone] Fax: [phone]     Social Drivers of Health (SDOH) Social History: SDOH Screenings   Food Insecurity: No Food Insecurity (01/14/2024)  Housing: Low Risk  (01/14/2024)  Transportation Needs: No Transportation Needs (01/14/2024)  Utilities: Not At Risk (01/14/2024)  Depression (PHQ2-9): Low Risk  (02/09/2020)  Social Connections: Unknown (01/14/2024)  Tobacco Use: Low Risk  (01/02/2024)   SDOH Interventions:     Readmission Risk Interventions     No data to display

## 2024-01-15 NOTE — Care Management Obs Status (Signed)
 MEDICARE OBSERVATION STATUS NOTIFICATION   Patient Details  Name: Ashawnti Tangen MRN: [id number] Date of Birth: 09-18-1940   Medicare Observation Status Notification Given:     Letter signed and copy given  Wynonia Hedges 01/15/2024, 11:25 AM

## 2024-01-15 NOTE — TOC CAGE-AID Note (Signed)
 Transition of Care [facility]) - CAGE-AID Screening   Patient Details  Name: Stacy [name] MRN: [id number] Date of Birth: 09-09-1940  Transition of Care [facility]) CM/SW Contact:    Hillis Mcphatter E Cassiel Fernandez, LCSW Phone Number: 01/15/2024, 9:07 AM   Clinical Narrative:    CAGE-AID Screening:    Have You Ever Felt You Ought to Cut Down on Your Drinking or Drug Use?: No Have People Annoyed You By [employer] Your Drinking Or Drug Use?: No Have You Felt Bad Or Guilty About Your Drinking Or Drug Use?: No Have You Ever Had a Drink or Used Drugs First Thing In The Morning to Steady Your Nerves or to Get Rid of a Hangover?: No CAGE-AID Score: 0  Substance Abuse Education Offered: No

## 2024-01-15 NOTE — Progress Notes (Signed)
 Paged MD because pt c/o feeling out of sorts and like something is wrong.  Said she cannot pinpoint it but something is definitely wrong.  Paged MD and informed him because of this.  NIHSS went from 10 to 9.  No signs of distress.  MD said will order Stat Head CT.

## 2024-01-16 DIAGNOSIS — R531 Weakness: Secondary | ICD-10-CM | POA: Diagnosis not present

## 2024-01-16 LAB — BASIC METABOLIC PANEL WITH GFR
Anion gap: 8 (ref 5–15)
Anion gap: 11 (ref 5–15)
BUN: 12 mg/dL (ref 8–23)
BUN: 16 mg/dL (ref 8–23)
CO2: 26 mmol/L (ref 22–32)
CO2: 25 mmol/L (ref 22–32)
Calcium: 9 mg/dL (ref 8.9–10.3)
Calcium: 8.7 mg/dL — ABNORMAL LOW (ref 8.9–10.3)
Chloride: 91 mmol/L — ABNORMAL LOW (ref 98–111)
Chloride: 91 mmol/L — ABNORMAL LOW (ref 98–111)
Creatinine, Ser: 0.6 mg/dL (ref 0.44–1.00)
Creatinine, Ser: 0.7 mg/dL (ref 0.44–1.00)
GFR, Estimated: >60 mL/min (ref >60)
GFR, Estimated: >60 mL/min (ref >60)
Glucose, Bld: 94 mg/dL (ref 70–99)
Glucose, Bld: 92 mg/dL (ref 70–99)
Potassium: 3.8 mmol/L (ref 3.5–5.1)
Potassium: 3.3 mmol/L — ABNORMAL LOW (ref 3.5–5.1)
Sodium: 125 mmol/L — ABNORMAL LOW (ref 135–145)
Sodium: 127 mmol/L — ABNORMAL LOW (ref 135–145)

## 2024-01-16 LAB — OSMOLALITY, URINE: Osmolality, Ur: 570 mosm/kg (ref 300–900)

## 2024-01-16 LAB — CBC
HCT: 40.5 % (ref 36.0–46.0)
Hemoglobin: 14.4 g/dL (ref 12.0–15.0)
MCH: 32.7 pg (ref 26.0–34.0)
MCHC: 35.6 g/dL (ref 30.0–36.0)
MCV: 92 fL (ref 80.0–100.0)
Platelets: 154 10*3/uL (ref 150–400)
RBC: 4.4 MIL/uL (ref 3.87–5.11)
RDW: 12.3 % (ref 11.5–15.5)
WBC: 6.7 10*3/uL (ref 4.0–10.5)
nRBC: 0 % (ref 0.0–0.2)

## 2024-01-16 LAB — SODIUM, URINE, RANDOM: Sodium, Ur: 54 mmol/L

## 2024-01-16 LAB — MAGNESIUM: Magnesium: 1.8 mg/dL (ref 1.7–2.4)

## 2024-01-16 MED ORDER — DILTIAZEM HCL ER COATED BEADS 120 MG PO CP24
120.0000 mg | ORAL_CAPSULE | Freq: Every day | ORAL | Status: DC
  Administered 2024-01-16 – 2024-01-17 (×2): 120 mg via ORAL
  Filled 2024-01-16 (×2): qty 1

## 2024-01-16 MED ORDER — LORAZEPAM 0.5 MG PO TABS
0.5000 mg | ORAL_TABLET | Freq: Every day | ORAL | Status: DC | PRN
  Administered 2024-01-17: 0.5 mg via ORAL
  Filled 2024-01-16: qty 1

## 2024-01-16 MED ORDER — POTASSIUM CHLORIDE CRYS ER 20 MEQ PO TBCR
20.0000 meq | EXTENDED_RELEASE_TABLET | Freq: Once | ORAL | Status: AC
  Administered 2024-01-16: 20 meq via ORAL
  Filled 2024-01-16: qty 1

## 2024-01-16 MED ORDER — VITAMIN D 25 MCG (1000 UNIT) PO TABS
1000.0000 [IU] | ORAL_TABLET | Freq: Every morning | ORAL | Status: DC
  Administered 2024-01-17 – 2024-01-18 (×2): 1000 [IU] via ORAL
  Filled 2024-01-16 (×2): qty 1

## 2024-01-16 MED ORDER — RISAQUAD PO CAPS
1.0000 | ORAL_CAPSULE | Freq: Every evening | ORAL | Status: DC
  Administered 2024-01-16 – 2024-01-17 (×2): 1 via ORAL
  Filled 2024-01-16 (×3): qty 1

## 2024-01-16 MED ORDER — SODIUM CHLORIDE 0.9 % IV SOLN: INTRAVENOUS | Status: DC

## 2024-01-16 MED ORDER — MAGNESIUM SULFATE 2 GM/50ML IV SOLN
2.0000 g | Freq: Once | INTRAVENOUS | Status: AC
  Administered 2024-01-16: 2 g via INTRAVENOUS
  Filled 2024-01-16: qty 50

## 2024-01-16 MED ORDER — POTASSIUM CHLORIDE CRYS ER 20 MEQ PO TBCR
40.0000 meq | EXTENDED_RELEASE_TABLET | Freq: Once | ORAL | Status: AC
  Administered 2024-01-16: 40 meq via ORAL
  Filled 2024-01-16: qty 2

## 2024-01-16 MED ORDER — NUTRISOURCE FIBER PO PACK
1.0000 | PACK | Freq: Every day | ORAL | Status: DC
  Administered 2024-01-16 – 2024-01-18 (×3): 1 via ORAL
  Filled 2024-01-16 (×3): qty 1

## 2024-01-16 MED ORDER — LEVOTHYROXINE SODIUM 25 MCG PO TABS
125.0000 ug | ORAL_TABLET | Freq: Every day | ORAL | Status: DC
  Administered 2024-01-17 – 2024-01-18 (×2): 125 ug via ORAL
  Filled 2024-01-16 (×2): qty 1

## 2024-01-16 NOTE — Progress Notes (Signed)
 PROGRESS NOTE    Stacy [name]  HQI:[id number] DOB: 12-23-1940 DOA: 01/14/2024 PCP: Chares Commons, PA-C   Brief Narrative: 83 year old with past medical history significant for paroxysmal A-fib on Eliquis , carotid artery stenosis, hyperlipidemia, hypertension, hypothyroidism, history presented to the ED with left-sided weakness acute onset, code stroke was activated.  Patient was found to have left-sided, dysarthria, left facial droop.  Assessment & Plan:   Principal Problem:   Left-sided weakness   1-Acute CVA, Right Thalamu. Patient was found to have left-sided, dysarthria, left facial droop. -CT head: No evidence of acute intracranial abnormality.  Mild chronic small vessel ischemic disease - CT angio head and neck: No large vessel occlusion.  Intracranial atherosclerosis supply right M1 and right P2 stenosis.  Cervical carotid atherosclerosis without significant stenosis of the common carotid or internal carotid artery.  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  A small bleed within the lesion which may have reflect developing petechial hemorrhage. Chronic microhemorrhage in the posterior left frontal lobe.  -ECHO:Ef 65 %, No wall motion abnormalities.  -On aspirin . Eliquis  initially held  due to micro hemorrhage.  -LD 128, A 1c: 5.0 Started  Lipitor.  Neurology recommend initiation of Eliquis  today   Nausea; suspect related to stroke. PRN Zofran .   Paroxysmal A-fib -will resume Cardizem  lower dose -Eliquis  resume per neurology   Hyponatremia:  -Follow labs.  -Sodium lower today.  -Was not getting IV fluids -Plan to resume IV NS and repeat labs tonight. Adjust as needed. Check Urine osmolality, urine sodium She has had history of hyponatremia in the past thought to be related to irbesartan .   Elevation of Troponin: -ECHO: EF 65 %, no wall motion abnormalities.   Hypothyroid -TSH: 5.9 -Continue with  Synthroid   Hypertension -Permissive HTN  Hypokalemia; Replaced.   Estimated body mass index is 20.84 kg/m as calculated from the following:   Height as of this encounter: 5' 5 (1.651 m).   Weight as of this encounter: 56.8 kg.   DVT prophylaxis: Lovenox  Code Status: Full code Family Communication: care dicussed with patient Disposition Plan:  Status is: Observation The patient remains OBS appropriate and will d/c before 2 midnights.    Consultants:  Neurology   Procedures:  none  Antimicrobials:    Subjective: She is alert, feels better, sitting recliner. Report mild nausea.  Left side weakness stable.   Objective: Vitals:   01/16/24 0003 01/16/24 0500 01/16/24 0757 01/16/24 1152  BP: (!) 154/110  (!) 156/93 (!) 159/89  Pulse: 79  70 79  Resp: 18   17  Temp: 99 F (37.2 C)  98.3 F (36.8 C) 97.9 F (36.6 C)  TempSrc:   Oral Oral  SpO2: 95%  98% 98%  Weight:  56.8 kg    Height:        Intake/Output Summary (Last 24 hours) at 01/16/2024 1440 Last data filed at 01/16/2024 1324 Gross per 24 hour  Intake --  Output 600 ml  Net -600 ml   Filed Weights   01/14/24 1200 01/14/24 1700 01/16/24 0500  Weight: 57 kg 57 kg 56.8 kg    Examination:  General exam: NAD Respiratory system: CTA Cardiovascular system: S 1, S 2 RRR Gastrointestinal system: BS present, soft, nt Central nervous system: Alert and oriented. Some dysarthria, left side 3-4/5  Extremities: No edema    Data Reviewed: I have personally reviewed following labs and imaging studies  CBC: Recent Labs  Lab 01/14/24 1224 01/14/24  1226 01/15/24 0543 01/16/24 1111  WBC 5.4  --  5.9 6.7  NEUTROABS 3.9  --   --   --   HGB 13.6 13.6 13.8 14.4  HCT 38.8 40.0 39.1 40.5  MCV 93.9  --  93.5 92.0  PLT 153  --  165 154   Basic Metabolic Panel: Recent Labs  Lab 01/14/24 1224 01/14/24 1226 01/15/24 0543 01/16/24 1111  NA 129* 129* 129* 125*  K 3.9 3.7 3.4* 3.8  CL 96* 96* 94* 91*  CO2  23  --  22 26  GLUCOSE 113* 115* 96 94  BUN 19 20 9 12   CREATININE 0.89 0.70 0.54 0.60  CALCIUM 9.2  --  8.8* 9.0  MG 1.9  --   --   --    GFR: Estimated Creatinine Clearance: 46.9 mL/min (by C-G formula based on SCr of 0.6 mg/dL). Liver Function Tests: Recent Labs  Lab 01/14/24 1224 01/15/24 0543  AST 26 34  ALT 20 22  ALKPHOS 56 68  BILITOT 0.9 1.1  PROT 6.5 6.2*  ALBUMIN 3.9 3.6   No results for input(s): LIPASE, AMYLASE in the last 168 hours. No results for input(s): AMMONIA in the last 168 hours. Coagulation Profile: Recent Labs  Lab 01/14/24 1224  INR 1.3*   Cardiac Enzymes: No results for input(s): CKTOTAL, CKMB, CKMBINDEX, TROPONINI in the last 168 hours. BNP (last 3 results) No results for input(s): PROBNP in the last 8760 hours. HbA1C: Recent Labs    01/15/24 0543  HGBA1C 5.0   CBG: Recent Labs  Lab 01/14/24 1220  GLUCAP 120*   Lipid Profile: Recent Labs    01/15/24 0543  CHOL 237*  HDL 101  LDLCALC 128*  TRIG 42  CHOLHDL 2.3   Thyroid  Function Tests: Recent Labs    01/14/24 1224 01/14/24 1401  TSH  --  5.952*  FREET4 1.38*  --    Anemia Panel: No results for input(s): VITAMINB12, FOLATE, FERRITIN, TIBC, IRON, RETICCTPCT in the last 72 hours. Sepsis Labs: No results for input(s): PROCALCITON, LATICACIDVEN in the last 168 hours.  No results found for this or any previous visit (from the past 240 hours).       Radiology Studies: MR BRAIN WO CONTRAST Result Date: 01/14/2024 CLINICAL DATA:  Stroke follow-up. EXAM: MRI HEAD WITHOUT CONTRAST TECHNIQUE: Multiplanar, multiecho pulse sequences of the brain and surrounding structures were obtained without intravenous contrast. COMPARISON:  CT head and CTA head and neck earlier same day. FINDINGS: Brain: 10 mm focus of restricted diffusion in the right thalamus compatible with acute infarct. Chronic microhemorrhage in the posterior left frontal lobe. Small  focus of susceptibility in the right thalamus corresponding to the region of infarct which may reflect petechial hemorrhage. T2/FLAIR hyperintensity in the periventricular and subcortical white matter. Mild parenchymal volume loss. No edema, mass effect, or midline shift. Posterior fossa is unremarkable. Normal appearance of midline structures. The basilar cisterns are patent. No extra-axial fluid collections. Ventricles: Normal size and configuration of the ventricles. Vascular: Skull base flow voids are visualized. Skull and upper cervical spine: No focal abnormality. Sinuses/Orbits: Bilateral lens replacement. Paranasal sinuses are clear. Other: Mastoid air cells are clear. IMPRESSION: Acute infarct in the right thalamus. Small focus of susceptibility within this region which may reflect developing petechial hemorrhage. Recommend short interval follow-up head CT. No significant edema or midline shift. Mild chronic microvascular ischemic changes and mild parenchymal volume loss. Chronic microhemorrhage in the posterior left frontal lobe. Electronically Signed  By: Denny Flack M.D.   On: 01/14/2024 20:21   ECHOCARDIOGRAM COMPLETE Result Date: 01/14/2024    ECHOCARDIOGRAM REPORT   Patient Name:   Stacy [name] Date of Exam: 01/14/2024 Medical Rec #:  [id number]       Height:       65.0 in Accession #:    [id number]      Weight:       125.7 lb Date of Birth:  [birth year]        BSA:          1.624 m Patient Age:    83 years        BP:           169/84 mmHg Patient Gender: F               HR:           74 bpm. Exam Location:  Inpatient Procedure: 2D Echo, Cardiac Doppler and Color Doppler (Both Spectral and Color            Flow Doppler were utilized during procedure). Indications:    Stroke  History:        Patient has prior history of Echocardiogram examinations, most                 recent 05/11/2013. Aortic Valve Disease, Arrythmias:Atrial                 Fibrillation, Signs/Symptoms:Murmur; Risk  Factors:Hypertension.  Sonographer:    Astrid Blamer Referring Phys: Alcide Humble PATEL IMPRESSIONS  1. Left ventricular ejection fraction, by estimation, is 65 to 70%. The left ventricle has normal function. The left ventricle has no regional wall motion abnormalities. There is mild left ventricular hypertrophy. Left ventricular diastolic function could not be evaluated.  2. Right ventricular systolic function is normal. The right ventricular size is normal.  3. Left atrial size was severely dilated.  4. The mitral valve is abnormal. Trivial mitral valve regurgitation. Mild mitral stenosis. Severe mitral annular calcification.  5. The aortic valve is calcified. There is moderate calcification of the aortic valve. There is moderate thickening of the aortic valve. Aortic valve regurgitation is mild to moderate. Mild aortic valve stenosis. Comparison(s): Prior images unable to be directly viewed, comparison made by report only. Conclusion(s)/Recommendation(s): EF unchanged, mild mitral stenosis, mild aortic stenosis. FINDINGS  Left Ventricle: Left ventricular ejection fraction, by estimation, is 65 to 70%. The left ventricle has normal function. The left ventricle has no regional wall motion abnormalities. The left ventricular internal cavity size was normal in size. There is  mild left ventricular hypertrophy. Left ventricular diastolic function could not be evaluated due to mitral annular calcification (moderate or greater). Left ventricular diastolic function could not be evaluated. Right Ventricle: The right ventricular size is normal. No increase in right ventricular wall thickness. Right ventricular systolic function is normal. Left Atrium: Left atrial size was severely dilated. Right Atrium: Right atrial size was normal in size. Pericardium: There is no evidence of pericardial effusion. Mitral Valve: The mitral valve is abnormal. Severe mitral annular calcification. Trivial mitral valve regurgitation. Mild mitral  valve stenosis. MV peak gradient, 16.8 mmHg. The mean mitral valve gradient is 6.0 mmHg. Tricuspid Valve: The tricuspid valve is normal in structure. Tricuspid valve regurgitation is trivial. No evidence of tricuspid stenosis. Aortic Valve: The aortic valve is calcified. There is moderate calcification of the aortic valve. There is moderate thickening of the aortic valve. Aortic valve regurgitation is mild to  moderate. Mild aortic stenosis is present. Aortic valve mean gradient measures 17.0 mmHg. Aortic valve peak gradient measures 36.5 mmHg. Aortic valve area, by VTI measures 1.43 cm. Pulmonic Valve: The pulmonic valve was not well visualized. Pulmonic valve regurgitation is not visualized. No evidence of pulmonic stenosis. Aorta: The aortic root is normal in size and structure. Venous: The inferior vena cava was not well visualized. IAS/Shunts: The atrial septum is grossly normal.  LEFT VENTRICLE PLAX 2D LVIDd:         3.10 cm   Diastology LVIDs:         1.50 cm   LV e' medial:    5.87 cm/s LV PW:         1.10 cm   LV E/e' medial:  17.9 LV IVS:        1.27 cm   LV e' lateral:   7.72 cm/s LVOT diam:     1.80 cm   LV E/e' lateral: 13.6 LV SV:         96 LV SV Index:   59 LVOT Area:     2.54 cm  RIGHT VENTRICLE RV S prime:     13.30 cm/s TAPSE (M-mode): 2.1 cm LEFT ATRIUM             Index        RIGHT ATRIUM          Index LA Vol (A2C):   64.3 ml 39.60 ml/m  RA Area:     7.20 cm LA Vol (A4C):   85.1 ml 52.41 ml/m  RA Volume:   10.40 ml 6.41 ml/m LA Biplane Vol: 77.8 ml 47.92 ml/m  AORTIC VALVE AV Area (Vmax):    1.39 cm AV Area (Vmean):   1.58 cm AV Area (VTI):     1.43 cm AV Vmax:           302.00 cm/s AV Vmean:          187.000 cm/s AV VTI:            0.669 m AV Peak Grad:      36.5 mmHg AV Mean Grad:      17.0 mmHg LVOT Vmax:         165.00 cm/s LVOT Vmean:        116.000 cm/s LVOT VTI:          0.376 m LVOT/AV VTI ratio: 0.56  AORTA Ao Root diam: 3.00 cm MITRAL VALVE MV Area (PHT): 3.27 cm     SHUNTS  MV Area VTI:   2.28 cm     Systemic VTI:  0.38 m MV Peak grad:  16.8 mmHg    Systemic Diam: 1.80 cm MV Mean grad:  6.0 mmHg MV Vmax:       2.05 m/s MV Vmean:      115.0 cm/s MV Decel Time: 232 msec MV E velocity: 105.00 cm/s MV A velocity: 179.00 cm/s MV E/A ratio:  0.59 Sheryle Donning MD Electronically signed by Sheryle Donning MD Signature Date/Time: 01/14/2024/7:40:23 PM    Final         Scheduled Meds:  apixaban   2.5 mg Oral BID   aspirin  EC  81 mg Oral Daily   atorvastatin  40 mg Oral Daily   LORazepam   0.5 mg Oral QHS   sodium chloride  flush  3 mL Intravenous Once   Continuous Infusions:  sodium chloride        LOS: 1 day    Time spent: 35 Minutes.  Danette Duos, MD [facility]   If 7PM-7AM, please contact night-coverage [email or web address]  01/16/2024, 2:40 PM

## 2024-01-16 NOTE — Progress Notes (Signed)
 [facility] night cross cover note:   I followed-up on EKG that was ordered towards the end of dayshift and performed at the beginning of this evening's night shift.  In comparison to most recent prior EKG performed yesterday (6/13), this evening's EKG shows sinus rhythm with PACs, left anterior fascicular block, heart rate 68, with nonspecific T wave inversion in lead III, relative to T wave flattening noted on yesterday's EKG, without any evidence of new t wave changes, while also demonstrating less than 1 mm ST elevation limited to V2, which appears similar to yesterday's EKG, without evidence of new ST changes.  This evening's EKG also shows interval improvement in previously noted QTc prolongation, with QTc now reported to be 452 ms.   Labs performed at the end of dayshift were notable for sodium 127, increased from most recent prior value of 125 while on interval NS at 100 cc/hr. Additionally, potassium level was 3.3, for which pt's rounding hospitalist has ordered a total of 60 meq of PO potassium. Also, mag level 1.8, for which rounding hospitalist has ordered Mag sulfate 2 g iv.     Camelia Cavalier, DO Hospitalist

## 2024-01-16 NOTE — Progress Notes (Signed)
 Physical Therapy Treatment Patient Details Name: Stacy [name] MRN: [id number] DOB: 06-18-1941 Today's Date: 01/16/2024   History of Present Illness Stacy [name] is a 83 y.o. female who presented with L weakness and dizziness. 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 PMHx: AF HLD HTN.    PT Comments  Pt progressing well. Mod assist bed mobility, mod assist sit to stand, and mod assist SPT. Fair sitting balance and poor standing balance. Pt in recliner with feet elevated at end of session.    If plan is discharge home, recommend the following: A little help with walking and/or transfers;A little help with bathing/dressing/bathroom;Assistance with cooking/housework;Direct supervision/assist for medications management;Direct supervision/assist for financial management;Assist for transportation;Help with stairs or ramp for entrance   Can travel by private vehicle     No  Equipment Recommendations  Rolling walker (2 wheels);Wheelchair (measurements PT);Wheelchair cushion (measurements PT);Hospital bed    Recommendations for Other Services       Precautions / Restrictions Precautions Precautions: Fall Recall of Precautions/Restrictions: Intact     Mobility  Bed Mobility Overal bed mobility: Needs Assistance Bed Mobility: Supine to Sit     Supine to sit: Mod assist, HOB elevated, Used rails     General bed mobility comments: assist with LLE and trunk, use of bed pad to scoot to EOB    Transfers Overall transfer level: Needs assistance Equipment used: 1 person hand held assist Transfers: Sit to/from Stand, Bed to chair/wheelchair/BSC Sit to Stand: Mod assist   Step pivot transfers: Mod assist       General transfer comment: face-to-face assist. Pivot toward the L. Unable to clear L foot from floor.    Ambulation/Gait                   Stairs             Wheelchair Mobility     Tilt Bed    Modified Rankin (Stroke Patients  Only) Modified Rankin (Stroke Patients Only) Pre-Morbid Rankin Score: No symptoms Modified Rankin: Moderately severe disability     Balance Overall balance assessment: Needs assistance Sitting-balance support: Feet supported, Bilateral upper extremity supported Sitting balance-Leahy Scale: Fair     Standing balance support: Bilateral upper extremity supported, During functional activity Standing balance-Leahy Scale: Poor Standing balance comment: reliant on therapist support                            Communication Communication Communication: No apparent difficulties  Cognition Arousal: Alert Behavior During Therapy: WFL for tasks assessed/performed, Anxious   PT - Cognitive impairments: No apparent impairments                         Following commands: Intact      Cueing Cueing Techniques: Verbal cues  Exercises      General Comments        Pertinent Vitals/Pain Pain Assessment Pain Assessment: No/denies pain    Home Living                          Prior Function            PT Goals (current goals can now be found in the care plan section) Acute Rehab PT Goals Patient Stated Goal: rehab at [facility] Progress towards PT goals: Progressing toward goals    Frequency    Min 3X/week  PT Plan      Co-evaluation              AM-PAC PT 6 Clicks Mobility   Outcome Measure  Help needed turning from your back to your side while in a flat bed without using bedrails?: A Little Help needed moving from lying on your back to sitting on the side of a flat bed without using bedrails?: A Lot Help needed moving to and from a bed to a chair (including a wheelchair)?: A Lot Help needed standing up from a chair using your arms (e.g., wheelchair or bedside chair)?: A Lot Help needed to walk in hospital room?: Total Help needed climbing 3-5 steps with a railing? : Total 6 Click Score: 11    End of Session Equipment  Utilized During Treatment: Gait belt Activity Tolerance: Patient tolerated treatment well Patient left: in chair;with chair alarm set;with call bell/phone within reach Nurse Communication: Mobility status PT Visit Diagnosis: Other abnormalities of gait and mobility (R26.89)     Time: 0102-7253 PT Time Calculation (min) (ACUTE ONLY): 17 min  Charges:    $Therapeutic Activity: 8-22 mins PT General Charges $$ ACUTE PT VISIT: 1 Visit                     Dorothye Gathers., PT  Office # [phone]    Guadelupe Leech 01/16/2024, 9:37 AM

## 2024-01-16 NOTE — Plan of Care (Signed)
 Very anxious and nervous about stroke and hits the callight several times in a short moment because thinks that she can be having another stroke because she forgets something or she says she feels out of sorts or feels like something just is not right but cannot put her finger on what it is.  Has requested several times that the doctor be notified.  Dr Landrum Pink was notified and did come up  to speak with her and reminded her that some of what she is feeling is her anxiety, as she admits to having severe anxiety and she takes daily Lorazepam  for it.  Was requesting that she be given a large dose of Lorazepam  to put her to sleep.  Was reminded that is not safe and we need to be able to monitor and ensure her safety.  Will call staff in the room to see if decreased appetite is her having another stroke or if being forgetful is her having another stroke.  Has requested to speak with the doctor about 7-8 times  this shift.  NIHSS has not changed when she feels these events coming on and her vitals have been stable as well.    Problem: Coping: Goal: Will verbalize positive feelings about self Outcome: Progressing   Problem: Education: Goal: Knowledge of secondary prevention will improve (MUST DOCUMENT ALL) Outcome: Progressing   Problem: Education: Goal: Knowledge of patient specific risk factors will improve (DELETE if not current risk factor) Outcome: Progressing   Problem: Coping: Goal: Level of anxiety will decrease Outcome: Progressing   Problem: Safety: Goal: Ability to remain free from injury will improve Outcome: Progressing

## 2024-01-16 NOTE — Progress Notes (Signed)
 Dr. Brock Canner  made aware of EKG and lab results. New orders noted from attending provider from dayshift.

## 2024-01-17 DIAGNOSIS — R531 Weakness: Secondary | ICD-10-CM | POA: Diagnosis not present

## 2024-01-17 LAB — BASIC METABOLIC PANEL WITH GFR
Anion gap: 9 (ref 5–15)
BUN: 11 mg/dL (ref 8–23)
CO2: 25 mmol/L (ref 22–32)
Calcium: 8.8 mg/dL — ABNORMAL LOW (ref 8.9–10.3)
Chloride: 93 mmol/L — ABNORMAL LOW (ref 98–111)
Creatinine, Ser: 0.56 mg/dL (ref 0.44–1.00)
GFR, Estimated: >60 mL/min (ref >60)
Glucose, Bld: 109 mg/dL — ABNORMAL HIGH (ref 70–99)
Potassium: 4.1 mmol/L (ref 3.5–5.1)
Sodium: 127 mmol/L — ABNORMAL LOW (ref 135–145)

## 2024-01-17 LAB — MAGNESIUM: Magnesium: 2 mg/dL (ref 1.7–2.4)

## 2024-01-17 MED ORDER — DILTIAZEM HCL ER COATED BEADS 180 MG PO CP24
180.0000 mg | ORAL_CAPSULE | Freq: Every day | ORAL | Status: DC
  Administered 2024-01-18: 180 mg via ORAL
  Filled 2024-01-17: qty 1

## 2024-01-17 MED ORDER — LACTULOSE 10 GM/15ML PO SOLN
20.0000 g | Freq: Every day | ORAL | Status: DC
  Administered 2024-01-17 – 2024-01-18 (×2): 20 g via ORAL
  Filled 2024-01-17 (×2): qty 30

## 2024-01-17 NOTE — Plan of Care (Signed)
   Problem: Education: Goal: Knowledge of disease or condition will improve Outcome: Progressing Goal: Knowledge of secondary prevention will improve (MUST DOCUMENT ALL) Outcome: Progressing

## 2024-01-17 NOTE — Progress Notes (Signed)
 PROGRESS NOTE    Stacy [name]  ZOX:[id number] DOB: May 08, 1941 DOA: 01/14/2024 PCP: Chares Commons, PA-C   Brief Narrative: 83 year old with past medical history significant for paroxysmal A-fib on Eliquis , carotid artery stenosis, hyperlipidemia, hypertension, hypothyroidism, history presented to the ED with left-sided weakness acute onset, code stroke was activated.  Patient was found to have left-sided, dysarthria, left facial droop.  Assessment & Plan:   Principal Problem:   Left-sided weakness   1-Acute CVA, Right [place]. Patient was found to have left-sided, dysarthria, left facial droop. -CT head: No evidence of acute intracranial abnormality.  Mild chronic small vessel ischemic disease - CT angio head and neck: No large vessel occlusion.  Intracranial atherosclerosis supply right M1 and right P2 stenosis.  Cervical carotid atherosclerosis without significant stenosis of the common carotid or internal carotid artery.  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  A small bleed within the lesion which may have reflect developing petechial hemorrhage. Chronic microhemorrhage in the posterior left frontal lobe.  -ECHO:Ef 65 %, No wall motion abnormalities.  -On aspirin . Eliquis  initially held  due to micro hemorrhage.  -LD 128, A 1c: 5.0 -Started on  Lipitor.  -Eliquis  resume 6/14 Neuro symptoms stable.   Nausea; suspect related to stroke. PRN Zofran .  Combination stroke, hyponatremia.   Paroxysmal A-fib -Resume cardizem , increase dose -Continue with Eliquis .   Hyponatremia:  -Combination SIADH and dehydration.  She has had history of hyponatremia in the past thought to be related to Irbesartan .  -Urine Osmolality 500 range. Suggesting SIADH Sodium improved with NS. Suspect component of hypovolemia.  Plan for water restriction.  B-met pending.   Elevation of Troponin: -ECHO: EF 65 %, no wall motion abnormalities.    Hypothyroid -TSH: 5.9 -Continue with Synthroid   Hypertension -Permissive HTN -if BP continue to be elevated might need second meds  Hypokalemia; Replaced.   Anxiety;  Patient with multiples anxiety episode during hospitalization. When come to evaluate her, she said it is my anxiety, I feel better now decline further CT scans Continue with Lorazepam  HS. Added  daily dose PRN  Estimated body mass index is 18.42 kg/m as calculated from the following:   Height as of this encounter: 5' 5 (1.651 m).   Weight as of this encounter: 50.2 kg.   DVT prophylaxis: Lovenox  Code Status: Full code Family Communication: care dicussed with patient Disposition Plan:  Status is: Observation The patient remains OBS appropriate and will d/c before 2 midnights.    Consultants:  Neurology   Procedures:  none  Antimicrobials:    Subjective: She is feeling better, dizziness stable. Report improvement of left side weakness. We talk about fluid restrictions.   Objective: Vitals:   01/16/24 2354 01/17/24 0343 01/17/24 0500 01/17/24 0759  BP: (!) 178/97 (!) 166/86  (!) 178/86  Pulse: 78 65  76  Resp: 18 18  16   Temp: 98 F (36.7 C) 98 F (36.7 C)  97.7 F (36.5 C)  TempSrc:    Oral  SpO2: 97% 97%  97%  Weight:   50.2 kg   Height:        Intake/Output Summary (Last 24 hours) at 01/17/2024 0841 Last data filed at 01/17/2024 0530 Gross per 24 hour  Intake 50 ml  Output 400 ml  Net -350 ml   Filed Weights   01/14/24 1700 01/16/24 0500 01/17/24 0500  Weight: 57 kg 56.8 kg 50.2 kg    Examination:  General exam: NAD Respiratory  system: CTA Cardiovascular system: S 1, S 2 RRR Gastrointestinal system: BS present, soft, nt Central nervous system: Alert and oriented. Some dysarthria, left side 3-4/5  Extremities: No edema    Data Reviewed: I have personally reviewed following labs and imaging studies  CBC: Recent Labs  Lab 01/14/24 1224 01/14/24 1226 01/15/24 0543  01/16/24 1111  WBC 5.4  --  5.9 6.7  NEUTROABS 3.9  --   --   --   HGB 13.6 13.6 13.8 14.4  HCT 38.8 40.0 39.1 40.5  MCV 93.9  --  93.5 92.0  PLT 153  --  165 154   Basic Metabolic Panel: Recent Labs  Lab 01/14/24 1224 01/14/24 1226 01/15/24 0543 01/16/24 1111 01/16/24 1819  NA 129* 129* 129* 125* 127*  K 3.9 3.7 3.4* 3.8 3.3*  CL 96* 96* 94* 91* 91*  CO2 23  --  22 26 25   GLUCOSE 113* 115* 96 94 92  BUN 19 20 9 12 16   CREATININE 0.89 0.70 0.54 0.60 0.70  CALCIUM 9.2  --  8.8* 9.0 8.7*  MG 1.9  --   --   --  1.8   GFR: Estimated Creatinine Clearance: 41.5 mL/min (by C-G formula based on SCr of 0.7 mg/dL). Liver Function Tests: Recent Labs  Lab 01/14/24 1224 01/15/24 0543  AST 26 34  ALT 20 22  ALKPHOS 56 68  BILITOT 0.9 1.1  PROT 6.5 6.2*  ALBUMIN 3.9 3.6   No results for input(s): LIPASE, AMYLASE in the last 168 hours. No results for input(s): AMMONIA in the last 168 hours. Coagulation Profile: Recent Labs  Lab 01/14/24 1224  INR 1.3*   Cardiac Enzymes: No results for input(s): CKTOTAL, CKMB, CKMBINDEX, TROPONINI in the last 168 hours. BNP (last 3 results) No results for input(s): PROBNP in the last 8760 hours. HbA1C: Recent Labs    01/15/24 0543  HGBA1C 5.0   CBG: Recent Labs  Lab 01/14/24 1220  GLUCAP 120*   Lipid Profile: Recent Labs    01/15/24 0543  CHOL 237*  HDL 101  LDLCALC 128*  TRIG 42  CHOLHDL 2.3   Thyroid  Function Tests: Recent Labs    01/14/24 1224 01/14/24 1401  TSH  --  5.952*  FREET4 1.38*  --    Anemia Panel: No results for input(s): VITAMINB12, FOLATE, FERRITIN, TIBC, IRON, RETICCTPCT in the last 72 hours. Sepsis Labs: No results for input(s): PROCALCITON, LATICACIDVEN in the last 168 hours.  No results found for this or any previous visit (from the past 240 hours).       Radiology Studies: No results found.       Scheduled Meds:  acidophilus  1 capsule Oral QPM    apixaban   2.5 mg Oral BID   aspirin  EC  81 mg Oral Daily   atorvastatin  40 mg Oral Daily   cholecalciferol   1,000 Units Oral q morning   [START ON 01/18/2024] diltiazem   180 mg Oral Daily   fiber  1 packet Oral Daily   levothyroxine   125 mcg Oral Q0600   LORazepam   0.5 mg Oral QHS   sodium chloride  flush  3 mL Intravenous Once   Continuous Infusions:     LOS: 2 days    Time spent: 35 Minutes.     Danette Duos, MD [facility]   If 7PM-7AM, please contact night-coverage [email or web address]  01/17/2024, 8:41 AM

## 2024-01-18 ENCOUNTER — Encounter: Payer: Self-pay | Admitting: Sports Medicine

## 2024-01-18 ENCOUNTER — Non-Acute Institutional Stay (SKILLED_NURSING_FACILITY): Payer: Self-pay | Admitting: Sports Medicine

## 2024-01-18 DIAGNOSIS — Z7401 Bed confinement status: Secondary | ICD-10-CM | POA: Diagnosis not present

## 2024-01-18 DIAGNOSIS — E782 Mixed hyperlipidemia: Secondary | ICD-10-CM | POA: Diagnosis not present

## 2024-01-18 DIAGNOSIS — I639 Cerebral infarction, unspecified: Secondary | ICD-10-CM

## 2024-01-18 DIAGNOSIS — R42 Dizziness and giddiness: Secondary | ICD-10-CM | POA: Diagnosis not present

## 2024-01-18 DIAGNOSIS — E871 Hypo-osmolality and hyponatremia: Secondary | ICD-10-CM | POA: Diagnosis not present

## 2024-01-18 DIAGNOSIS — F411 Generalized anxiety disorder: Secondary | ICD-10-CM | POA: Insufficient documentation

## 2024-01-18 DIAGNOSIS — I6529 Occlusion and stenosis of unspecified carotid artery: Secondary | ICD-10-CM | POA: Diagnosis not present

## 2024-01-18 DIAGNOSIS — R21 Rash and other nonspecific skin eruption: Secondary | ICD-10-CM | POA: Diagnosis not present

## 2024-01-18 DIAGNOSIS — I951 Orthostatic hypotension: Secondary | ICD-10-CM | POA: Diagnosis not present

## 2024-01-18 DIAGNOSIS — K1379 Other lesions of oral mucosa: Secondary | ICD-10-CM | POA: Diagnosis not present

## 2024-01-18 DIAGNOSIS — I6381 Other cerebral infarction due to occlusion or stenosis of small artery: Secondary | ICD-10-CM | POA: Diagnosis not present

## 2024-01-18 DIAGNOSIS — E039 Hypothyroidism, unspecified: Secondary | ICD-10-CM

## 2024-01-18 DIAGNOSIS — I10 Essential (primary) hypertension: Secondary | ICD-10-CM | POA: Diagnosis not present

## 2024-01-18 DIAGNOSIS — Z8673 Personal history of transient ischemic attack (TIA), and cerebral infarction without residual deficits: Secondary | ICD-10-CM | POA: Diagnosis not present

## 2024-01-18 DIAGNOSIS — I69354 Hemiplegia and hemiparesis following cerebral infarction affecting left non-dominant side: Secondary | ICD-10-CM | POA: Diagnosis not present

## 2024-01-18 DIAGNOSIS — M6281 Muscle weakness (generalized): Secondary | ICD-10-CM | POA: Insufficient documentation

## 2024-01-18 DIAGNOSIS — I6523 Occlusion and stenosis of bilateral carotid arteries: Secondary | ICD-10-CM | POA: Diagnosis not present

## 2024-01-18 DIAGNOSIS — R350 Frequency of micturition: Secondary | ICD-10-CM | POA: Diagnosis not present

## 2024-01-18 DIAGNOSIS — E222 Syndrome of inappropriate secretion of antidiuretic hormone: Secondary | ICD-10-CM | POA: Diagnosis not present

## 2024-01-18 DIAGNOSIS — K5901 Slow transit constipation: Secondary | ICD-10-CM | POA: Diagnosis not present

## 2024-01-18 DIAGNOSIS — I480 Paroxysmal atrial fibrillation: Secondary | ICD-10-CM

## 2024-01-18 DIAGNOSIS — I6339 Cerebral infarction due to thrombosis of other cerebral artery: Secondary | ICD-10-CM | POA: Diagnosis not present

## 2024-01-18 DIAGNOSIS — E785 Hyperlipidemia, unspecified: Secondary | ICD-10-CM | POA: Diagnosis not present

## 2024-01-18 DIAGNOSIS — N3000 Acute cystitis without hematuria: Secondary | ICD-10-CM | POA: Diagnosis not present

## 2024-01-18 DIAGNOSIS — Z743 Need for continuous supervision: Secondary | ICD-10-CM | POA: Diagnosis not present

## 2024-01-18 DIAGNOSIS — I6330 Cerebral infarction due to thrombosis of unspecified cerebral artery: Secondary | ICD-10-CM | POA: Insufficient documentation

## 2024-01-18 DIAGNOSIS — R531 Weakness: Secondary | ICD-10-CM | POA: Diagnosis not present

## 2024-01-18 DIAGNOSIS — F419 Anxiety disorder, unspecified: Secondary | ICD-10-CM | POA: Diagnosis not present

## 2024-01-18 DIAGNOSIS — N3020 Other chronic cystitis without hematuria: Secondary | ICD-10-CM | POA: Diagnosis not present

## 2024-01-18 LAB — BASIC METABOLIC PANEL WITH GFR
Anion gap: 8 (ref 5–15)
BUN: 17 mg/dL (ref 8–23)
CO2: 25 mmol/L (ref 22–32)
Calcium: 8.5 mg/dL — ABNORMAL LOW (ref 8.9–10.3)
Chloride: 95 mmol/L — ABNORMAL LOW (ref 98–111)
Creatinine, Ser: 0.63 mg/dL (ref 0.44–1.00)
GFR, Estimated: >60 mL/min (ref >60)
Glucose, Bld: 88 mg/dL (ref 70–99)
Potassium: 3.8 mmol/L (ref 3.5–5.1)
Sodium: 128 mmol/L — ABNORMAL LOW (ref 135–145)

## 2024-01-18 MED ORDER — ASPIRIN 81 MG PO TBEC: 81.0000 mg | DELAYED_RELEASE_TABLET | Freq: Every day | ORAL | 12 refills | Status: AC

## 2024-01-18 MED ORDER — ATORVASTATIN CALCIUM 40 MG PO TABS: 40.0000 mg | ORAL_TABLET | Freq: Every day | ORAL | 2 refills | Status: DC

## 2024-01-18 MED ORDER — LACTULOSE 10 GM/15ML PO SOLN
20.0000 g | Freq: Every day | ORAL | 0 refills | Status: DC
Start: 2024-01-18 — End: 2024-02-11

## 2024-01-18 NOTE — Plan of Care (Signed)
  Problem: Education: Goal: Knowledge of disease or condition will improve Outcome: Progressing Goal: Knowledge of secondary prevention will improve (MUST DOCUMENT ALL) Outcome: Progressing Goal: Knowledge of patient specific risk factors will improve (DELETE if not current risk factor) Outcome: Progressing   Problem: Ischemic Stroke/TIA Tissue Perfusion: Goal: Complications of ischemic stroke/TIA will be minimized Outcome: Progressing   Problem: Coping: Goal: Will verbalize positive feelings about self Outcome: Progressing Goal: Will identify appropriate support needs Outcome: Progressing   Problem: Health Behavior/Discharge Planning: Goal: Ability to manage health-related needs will improve Outcome: Progressing Goal: Goals will be collaboratively established with patient/family Outcome: Progressing   Problem: Self-Care: Goal: Ability to participate in self-care as condition permits will improve Outcome: Progressing Goal: Verbalization of feelings and concerns over difficulty with self-care will improve Outcome: Progressing Goal: Ability to communicate needs accurately will improve Outcome: Progressing   Problem: Nutrition: Goal: Risk of aspiration will decrease Outcome: Progressing Goal: Dietary intake will improve Outcome: Progressing   Problem: Education: Goal: Knowledge of condition and prescribed therapy will improve Outcome: Progressing   Problem: Cardiac: Goal: Will achieve and/or maintain adequate cardiac output Outcome: Progressing   Problem: Physical Regulation: Goal: Complications related to the disease process, condition or treatment will be avoided or minimized Outcome: Progressing   Problem: Education: Goal: Knowledge of General Education information will improve Description: Including pain rating scale, medication(s)/side effects and non-pharmacologic comfort measures Outcome: Progressing   Problem: Health Behavior/Discharge Planning: Goal:  Ability to manage health-related needs will improve Outcome: Progressing   Problem: Clinical Measurements: Goal: Ability to maintain clinical measurements within normal limits will improve Outcome: Progressing Goal: Will remain free from infection Outcome: Progressing Goal: Diagnostic test results will improve Outcome: Progressing Goal: Respiratory complications will improve Outcome: Progressing Goal: Cardiovascular complication will be avoided Outcome: Progressing   Problem: Activity: Goal: Risk for activity intolerance will decrease Outcome: Progressing   Problem: Nutrition: Goal: Adequate nutrition will be maintained Outcome: Progressing   Problem: Coping: Goal: Level of anxiety will decrease Outcome: Progressing   Problem: Elimination: Goal: Will not experience complications related to bowel motility Outcome: Progressing Goal: Will not experience complications related to urinary retention Outcome: Progressing   Problem: Pain Managment: Goal: General experience of comfort will improve and/or be controlled Outcome: Progressing   Problem: Safety: Goal: Ability to remain free from injury will improve Outcome: Progressing   Problem: Skin Integrity: Goal: Risk for impaired skin integrity will decrease Outcome: Progressing

## 2024-01-18 NOTE — Discharge Summary (Signed)
 Physician Discharge Summary   Patient: Stacy [name] MRN: [id number] DOB: 03-19-1941  Admit date:     01/14/2024  Discharge date: 01/18/24  Discharge Physician: Stacy [name]   PCP: Stacy Commons, PA-C   Recommendations at discharge:   Needs follow up with neurology post stroke in 2 weeks.  Needs follow up on Sodium level.  Needs to follow 1 L fluid restriction.   Discharge Diagnoses: Principal Problem:   Left-sided weakness  Resolved Problems:   * No resolved hospital problems. *  Hospital Course: 83 year old with past medical history significant for paroxysmal A-fib on Eliquis , carotid artery stenosis, hyperlipidemia, hypertension, hypothyroidism, history presented to the ED with left-sided weakness acute onset, code stroke was activated.  Patient was found to have left-sided, dysarthria, left facial droop.    Assessment and Plan: 1-Acute CVA, Right Thalamu. Patient was found to have left-sided, dysarthria, left facial droop. -CT head: No evidence of acute intracranial abnormality.  Mild chronic small vessel ischemic disease - CT angio head and neck: No large vessel occlusion.  Intracranial atherosclerosis supply right M1 and right P2 stenosis.  Cervical carotid atherosclerosis without significant stenosis of the common carotid or internal carotid artery.  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  A small bleed within the lesion which may have reflect developing petechial hemorrhage. Chronic microhemorrhage in the posterior left frontal lobe.  -ECHO:Ef 65 %, No wall motion abnormalities.  -On aspirin . Eliquis  initially held  due to micro hemorrhage.  -LD 128, A 1c: 5.0 Started  Lipitor.  Neurology recommend Aspirin  and Eliquis  for secondary stroke prevention.  Stable for discharge   Nausea; suspect related to stroke. PRN Zofran .  Combination stroke, hyponatremia   Paroxysmal A-fib -Continue with Cardizem  and Eliquis .     Hyponatremia:  She has had history of hyponatremia in the past thought to be related to irbesartan .  Combination SIADH and dehydration.  Responded initially to NS IV fluids suggesting component of dehydration. .  Continue with Water restriction. 1L Urine Osmolality 500 range. Suggesting SIADH   Elevation of Troponin: -ECHO: EF 65 %, no wall motion abnormalities.    Hypothyroid -TSH: 5.9 -Continue with Synthroid    Hypertension -Resume Cardizem .     Hypokalemia; Replaced. IBS; constipation; Fiber Lactulose.   Estimated body mass index is 20.84 kg/m as calculated from the following:   Height as of this encounter: 5' 5 (1.651 m).   Weight as of this encounter: 56.8 kg.            Consultants:Neurology  Procedures performed: none Disposition: Skilled nursing facility Diet recommendation:  Discharge Diet Orders (From admission, onward)     Start     Ordered   01/18/24 0000  Diet - low sodium heart healthy        01/18/24 0842           Regular diet DISCHARGE MEDICATION: Allergies as of 01/18/2024       Reactions   Amoxicillin Swelling   Flonase [fluticasone Propionate] Swelling   Swelling of throat per patient   Sulfa Antibiotics Other (See Comments)   Upset stomach   Sulfasalazine Other (See Comments)   Upset stomach        Medication List     TAKE these medications    ACID REDUCER PO Take 1 tablet by mouth at bedtime as needed.   aspirin  EC 81 MG tablet Take 1 tablet (81 mg total) by mouth daily. Swallow whole.  atorvastatin 40 MG tablet Commonly known as: LIPITOR Take 1 tablet (40 mg total) by mouth daily.   BENEFIBER PO Take 0.5 Scoops by mouth as needed.   diltiazem  180 MG 24 hr capsule Commonly known as: Cartia  XT Take 1 capsule (180 mg total) by mouth at bedtime. What changed: when to take this   Eliquis  2.5 MG Tabs tablet Generic drug: apixaban  Take 2.5 mg by mouth 2 (two) times daily.   GAS-X PO Take 1 tablet by mouth  as needed.   lactulose 10 GM/15ML solution Commonly known as: CHRONULAC Take 30 mLs (20 g total) by mouth daily.   levothyroxine  125 MCG tablet Commonly known as: SYNTHROID  TAKE 1 TABLET BY MOUTH DAILY BEFORE BREAKFAST.   LORazepam  0.5 MG tablet Commonly known as: ATIVAN  Take 1 tablet (0.5 mg total) by mouth 2 (two) times daily as needed for anxiety. What changed: when to take this   MULTIVITAMINS PO Take 2 each by mouth every evening. Gummies   PRESERVISION AREDS PO Take 1 tablet by mouth at bedtime.   Probiotic Chew Chew 1 each by mouth every evening.   RA CRANBERRY SUPPLEMENTS PO Take 2 tablets by mouth every evening.   Vitamin D -3 25 MCG (1000 UT) Caps Take 1,000 Units by mouth in the morning.        Contact information for follow-up providers     [name]. Schedule an appointment as soon as possible for a visit in 1 month(s).   Specialty: Neurology Why: stroke clinic Contact information: [address] Stacy [name]  [phone]             Contact information for after-discharge care     Destination     St[name] .   Service: Skilled Nursing Contact information: [address] [place]  [phone]                    Discharge Exam: Stacy [name] Weights   01/16/24 0500 01/17/24 0500 01/18/24 0500  Weight: 56.8 kg 50.2 kg 52.5 kg   General; NAD  Condition at discharge: stable  The results of significant diagnostics from this hospitalization (including imaging, microbiology, ancillary and laboratory) are listed below for reference.   Imaging Studies: MR BRAIN WO CONTRAST Result Date: 01/14/2024 CLINICAL DATA:  Stroke follow-up. EXAM: MRI HEAD WITHOUT CONTRAST TECHNIQUE: Multiplanar, multiecho pulse sequences of the brain and surrounding structures were obtained without intravenous contrast. COMPARISON:  CT head and CTA head and neck  earlier same day. FINDINGS: Brain: 10 mm focus of restricted diffusion in the right thalamus compatible with acute infarct. Chronic microhemorrhage in the posterior left frontal lobe. Small focus of susceptibility in the right thalamus corresponding to the region of infarct which may reflect petechial hemorrhage. T2/FLAIR hyperintensity in the periventricular and subcortical white matter. Mild parenchymal volume loss. No edema, mass effect, or midline shift. Posterior fossa is unremarkable. Normal appearance of midline structures. The basilar cisterns are patent. No extra-axial fluid collections. Ventricles: Normal size and configuration of the ventricles. Vascular: Skull base flow voids are visualized. Skull and upper cervical spine: No focal abnormality. Sinuses/Orbits: Bilateral lens replacement. Paranasal sinuses are clear. Other: Mastoid air cells are clear. IMPRESSION: Acute infarct in the right thalamus. Small focus of susceptibility within this region which may reflect developing petechial hemorrhage. Recommend short interval follow-up head CT. No significant edema or midline shift. Mild chronic microvascular ischemic changes and mild parenchymal volume loss. Chronic  microhemorrhage in the posterior left frontal lobe. Electronically Signed   By: Denny Flack M.D.   On: 01/14/2024 20:21   ECHOCARDIOGRAM COMPLETE Result Date: 01/14/2024    ECHOCARDIOGRAM REPORT   Patient Name:   Stacy [name] Date of Exam: 01/14/2024 Medical Rec #:  [id number]       Height:       65.0 in Accession #:    [id number]      Weight:       125.7 lb Date of Birth:  1941/07/21        BSA:          1.624 m Patient Age:    83 years        BP:           169/84 mmHg Patient Gender: F               HR:           74 bpm. Exam Location:  Inpatient Procedure: 2D Echo, Cardiac Doppler and Color Doppler (Both Spectral and Color            Flow Doppler were utilized during procedure). Indications:    Stroke  History:        Patient has prior  history of Echocardiogram examinations, most                 recent 05/11/2013. Aortic Valve Disease, Arrythmias:Atrial                 Fibrillation, Signs/Symptoms:Murmur; Risk Factors:Hypertension.  Sonographer:    Astrid Blamer Referring Phys: Alcide Humble PATEL IMPRESSIONS  1. Left ventricular ejection fraction, by estimation, is 65 to 70%. The left ventricle has normal function. The left ventricle has no regional wall motion abnormalities. There is mild left ventricular hypertrophy. Left ventricular diastolic function could not be evaluated.  2. Right ventricular systolic function is normal. The right ventricular size is normal.  3. Left atrial size was severely dilated.  4. The mitral valve is abnormal. Trivial mitral valve regurgitation. Mild mitral stenosis. Severe mitral annular calcification.  5. The aortic valve is calcified. There is moderate calcification of the aortic valve. There is moderate thickening of the aortic valve. Aortic valve regurgitation is mild to moderate. Mild aortic valve stenosis. Comparison(s): Prior images unable to be directly viewed, comparison made by report only. Conclusion(s)/Recommendation(s): EF unchanged, mild mitral stenosis, mild aortic stenosis. FINDINGS  Left Ventricle: Left ventricular ejection fraction, by estimation, is 65 to 70%. The left ventricle has normal function. The left ventricle has no regional wall motion abnormalities. The left ventricular internal cavity size was normal in size. There is  mild left ventricular hypertrophy. Left ventricular diastolic function could not be evaluated due to mitral annular calcification (moderate or greater). Left ventricular diastolic function could not be evaluated. Right Ventricle: The right ventricular size is normal. No increase in right ventricular wall thickness. Right ventricular systolic function is normal. Left Atrium: Left atrial size was severely dilated. Right Atrium: Right atrial size was normal in size. Pericardium:  There is no evidence of pericardial effusion. Mitral Valve: The mitral valve is abnormal. Severe mitral annular calcification. Trivial mitral valve regurgitation. Mild mitral valve stenosis. MV peak gradient, 16.8 mmHg. The mean mitral valve gradient is 6.0 mmHg. Tricuspid Valve: The tricuspid valve is normal in structure. Tricuspid valve regurgitation is trivial. No evidence of tricuspid stenosis. Aortic Valve: The aortic valve is calcified. There is moderate calcification of the aortic valve. There is moderate  thickening of the aortic valve. Aortic valve regurgitation is mild to moderate. Mild aortic stenosis is present. Aortic valve mean gradient measures 17.0 mmHg. Aortic valve peak gradient measures 36.5 mmHg. Aortic valve area, by VTI measures 1.43 cm. Pulmonic Valve: The pulmonic valve was not well visualized. Pulmonic valve regurgitation is not visualized. No evidence of pulmonic stenosis. Aorta: The aortic root is normal in size and structure. Venous: The inferior vena cava was not well visualized. IAS/Shunts: The atrial septum is grossly normal.  LEFT VENTRICLE PLAX 2D LVIDd:         3.10 cm   Diastology LVIDs:         1.50 cm   LV e' medial:    5.87 cm/s LV PW:         1.10 cm   LV E/e' medial:  17.9 LV IVS:        1.27 cm   LV e' lateral:   7.72 cm/s LVOT diam:     1.80 cm   LV E/e' lateral: 13.6 LV SV:         96 LV SV Index:   59 LVOT Area:     2.54 cm  RIGHT VENTRICLE RV S prime:     13.30 cm/s TAPSE (M-mode): 2.1 cm LEFT ATRIUM             Index        RIGHT ATRIUM          Index LA Vol (A2C):   64.3 ml 39.60 ml/m  RA Area:     7.20 cm LA Vol (A4C):   85.1 ml 52.41 ml/m  RA Volume:   10.40 ml 6.41 ml/m LA Biplane Vol: 77.8 ml 47.92 ml/m  AORTIC VALVE AV Area (Vmax):    1.39 cm AV Area (Vmean):   1.58 cm AV Area (VTI):     1.43 cm AV Vmax:           302.00 cm/s AV Vmean:          187.000 cm/s AV VTI:            0.669 m AV Peak Grad:      36.5 mmHg AV Mean Grad:      17.0 mmHg LVOT Vmax:          165.00 cm/s LVOT Vmean:        116.000 cm/s LVOT VTI:          0.376 m LVOT/AV VTI ratio: 0.56  AORTA Ao Root diam: 3.00 cm MITRAL VALVE MV Area (PHT): 3.27 cm     SHUNTS MV Area VTI:   2.28 cm     Systemic VTI:  0.38 m MV Peak grad:  16.8 mmHg    Systemic Diam: 1.80 cm MV Mean grad:  6.0 mmHg MV Vmax:       2.05 m/s MV Vmean:      115.0 cm/s MV Decel Time: 232 msec MV E velocity: 105.00 cm/s MV A velocity: 179.00 cm/s MV E/A ratio:  0.59 Sheryle Donning MD Electronically signed by Sheryle Donning MD Signature Date/Time: 01/14/2024/7:40:23 PM    Final    CT ANGIO HEAD NECK W WO CM (CODE STROKE) Result Date: 01/14/2024 CLINICAL DATA:  Neuro deficit, acute, stroke suspected. Left-sided weakness. EXAM: CT ANGIOGRAPHY HEAD AND NECK WITH AND WITHOUT CONTRAST TECHNIQUE: Multidetector CT imaging of the head and neck was performed using the standard protocol during bolus administration of intravenous contrast. Multiplanar CT image reconstructions and MIPs were obtained  to evaluate the vascular anatomy. Carotid stenosis measurements (when applicable) are obtained utilizing NASCET criteria, using the distal internal carotid diameter as the denominator. RADIATION DOSE REDUCTION: This exam was performed according to the departmental dose-optimization program which includes automated exposure control, adjustment of the mA and/or kV according to patient size and/or use of iterative reconstruction technique. CONTRAST:  75mL OMNIPAQUE  IOHEXOL  350 MG/ML SOLN COMPARISON:  None Available. FINDINGS: CTA NECK FINDINGS Aortic arch: Incomplete imaging of the aortic arch, including the origins of the brachiocephalic and left common carotid arteries. The brachiocephalic and subclavian arteries are patent with calcified plaque at the origin of the right subclavian artery resulting in no more than mild stenosis. Right carotid system: Patent with calcified plaque at the carotid bifurcation resulting in severe stenosis of the  origin of the external carotid artery. No evidence of a significant stenosis or dissection of the common carotid or cervical internal carotid arteries. Left carotid system: Patent with calcified plaque at the carotid bifurcation. No evidence of a significant stenosis or dissection. Vertebral arteries: Patent without evidence of a significant stenosis or dissection. Moderately dominant left vertebral artery. Skeleton: Widespread advanced cervical disc degeneration. Potentially moderate spinal stenosis at C4-5. Advanced multilevel neural foraminal stenosis. Other neck: No evidence of cervical lymphadenopathy or mass. Upper chest: Clear lung apices. Review of the MIP images confirms the above findings CTA HEAD FINDINGS Anterior circulation: The internal carotid arteries are patent from skull base to carotid termini with mild atherosclerotic irregularity bilaterally but no significant stenosis. ACAs and MCAs are patent without evidence of a proximal branch occlusion or significant A1 or left M1 stenosis. There is a severe proximal right M1 stenosis, and mild bilateral MCA and ACA branch vessel irregularity is noted. No aneurysm is identified. Posterior circulation: The intracranial vertebral arteries are widely patent to the basilar. Patent AICA and SCA origins are visualized bilaterally. The basilar artery is widely patent. Posterior communicating arteries are diminutive or absent. Both PCAs are patent. There are moderate proximal left P2 and severe distal right P2 stenoses. No aneurysm is identified. Venous sinuses: As permitted by contrast timing, patent. Anatomic variants: None. Review of the MIP images confirms the above findings These results were communicated to Dr. Christiane Cowing at 12:45 pm on 01/14/2024 by text page via the [facility] messaging system. IMPRESSION: 1. No large vessel occlusion. 2. Intracranial atherosclerosis including severe right M1 and right P2 stenoses. 3. Cervical carotid atherosclerosis without a  significant stenosis of the common carotid or internal carotid arteries. 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 Electronically Signed   By: Aundra Lee M.D.   On: 01/14/2024 13:02   CT HEAD CODE STROKE WO CONTRAST Result Date: 01/14/2024 CLINICAL DATA:  Code stroke. Neuro deficit, acute, stroke suspected. Left-sided weakness. EXAM: CT HEAD WITHOUT CONTRAST TECHNIQUE: Contiguous axial images were obtained from the base of the skull through the vertex without intravenous contrast. RADIATION DOSE REDUCTION: This exam was performed according to the departmental dose-optimization program which includes automated exposure control, adjustment of the mA and/or kV according to patient size and/or use of iterative reconstruction technique. COMPARISON:  None Available. FINDINGS: Brain: There is no evidence of an acute infarct, intracranial hemorrhage, mass, midline shift, or extra-axial fluid collection. Cerebral volume is within normal limits for age. Patchy hypodensities in the cerebral white matter bilaterally are nonspecific but compatible with mild chronic small vessel ischemic disease. Vascular: Calcified atherosclerosis at the skull base. No hyperdense vessel. Skull: No fracture or suspicious lesion. Hyperostosis frontalis interna. Sinuses/Orbits:  Visualized paranasal sinuses and mastoid air cells are clear. Bilateral cataract extraction. Other: None. ASPECTS [facility] Stroke Program Early CT Score) - Ganglionic level infarction (caudate, lentiform nuclei, internal capsule, insula, M1-M3 cortex): 7 - Supraganglionic infarction (M4-M6 cortex): 3 Total score (0-10 with 10 being normal): 10 IMPRESSION: 1. No evidence of acute intracranial abnormality. ASPECTS of 10. 2. Mild chronic small vessel ischemic disease. These results were communicated to Dr. Christiane Cowing at 12:45 pm on 01/14/2024 by text page via the [facility] messaging system. Electronically Signed   By: Aundra Lee M.D.   On: 01/14/2024 12:46   CT ABDOMEN PELVIS W  CONTRAST Result Date: 01/02/2024 CLINICAL DATA:  Abdominal pain. Palpitations. Suspected bowel obstruction. EXAM: CT ABDOMEN AND PELVIS WITH CONTRAST TECHNIQUE: Multidetector CT imaging of the abdomen and pelvis was performed using the standard protocol following bolus administration of intravenous contrast. RADIATION DOSE REDUCTION: This exam was performed according to the departmental dose-optimization program which includes automated exposure control, adjustment of the mA and/or kV according to patient size and/or use of iterative reconstruction technique. CONTRAST:  75mL OMNIPAQUE  IOHEXOL  350 MG/ML SOLN COMPARISON:  None Available. FINDINGS: Lower Chest: Mild compressive bibasilar atelectasis and tiny left pleural effusion. Hepatobiliary: No suspicious hepatic masses identified. A few small hepatic cysts are noted. Gallbladder is unremarkable. No evidence of biliary ductal dilatation. Pancreas:  No mass or inflammatory changes. Spleen: Within normal limits in size and appearance. Adrenals/Urinary Tract: No suspicious masses identified. Small benign-appearing bilateral subcapsular renal cysts incidentally noted (No followup imaging is recommended). No evidence of ureteral calculi or hydronephrosis. Unremarkable unopacified urinary bladder. Stomach/Bowel: A large hiatal hernia is seen which contains nearly the entire stomach, transverse colon, and pancreatic tail. No evidence of bowel obstruction, inflammatory process or abnormal fluid collections. Vascular/Lymphatic: No pathologically enlarged lymph nodes. No acute vascular findings. Reproductive:  No mass or other significant abnormality. Other:  None. Musculoskeletal: No suspicious bone lesions identified. Severe thoracolumbar rotatory levoscoliosis noted. IMPRESSION: No evidence of bowel obstruction or other acute findings. Large hiatal hernia, which contains nearly the entire stomach, transverse colon, and pancreatic tail. Severe thoracolumbar levoscoliosis.  Mild bibasilar atelectasis and tiny left pleural effusion. Electronically Signed   By: Marlyce Sine M.D.   On: 01/02/2024 14:30   DG Chest Portable 1 View Result Date: 01/02/2024 CLINICAL DATA:  palpitations EXAM: PORTABLE CHEST 1 VIEW COMPARISON:  April 04, 2023, April 07, 2015 FINDINGS: Evaluation is limited by rotation. Revisualization of a large diaphragmatic hernia. Cardiomediastinal silhouette is grossly unchanged. No pleural effusion or pneumothorax. No acute pleuroparenchymal abnormality. Atherosclerotic calcifications. Severe levo scoliosis of the thoracic spine. IMPRESSION: No acute cardiopulmonary abnormality. Electronically Signed   By: Clancy Crimes M.D.   On: 01/02/2024 13:18    Microbiology: Results for orders placed or performed during the hospital encounter of 01/02/24  MRSA Next Gen by PCR, Nasal     Status: None   Collection Time: 01/02/24  9:40 PM   Specimen: Nasal Mucosa; Nasal Swab  Result Value Ref Range Status   MRSA by PCR Next Gen NOT DETECTED NOT DETECTED Final    Comment: (NOTE) The GeneXpert MRSA Assay (FDA approved for NASAL specimens only), is one component of a comprehensive MRSA colonization surveillance program. It is not intended to diagnose MRSA infection nor to guide or monitor treatment for MRSA infections. Test performance is not FDA approved in patients less than 63 years old. Performed at [facility], [address], [place], [place] [address]     Labs:  CBC: Recent Labs  Lab 01/14/24 1224 01/14/24 1226 01/15/24 0543 01/16/24 1111  WBC 5.4  --  5.9 6.7  NEUTROABS 3.9  --   --   --   HGB 13.6 13.6 13.8 14.4  HCT 38.8 40.0 39.1 40.5  MCV 93.9  --  93.5 92.0  PLT 153  --  165 154   Basic Metabolic Panel: Recent Labs  Lab 01/14/24 1224 01/14/24 1226 01/15/24 0543 01/16/24 1111 01/16/24 1819 01/17/24 1129 01/18/24 0443  NA 129*   < > 129* 125* 127* 127* 128*  K 3.9   < > 3.4* 3.8 3.3* 4.1 3.8  CL 96*   < > 94* 91*  91* 93* 95*  CO2 23  --  22 26 25 25 25   GLUCOSE 113*   < > 96 94 92 109* 88  BUN 19   < > 9 12 16 11 17   CREATININE 0.89   < > 0.54 0.60 0.70 0.56 0.63  CALCIUM 9.2  --  8.8* 9.0 8.7* 8.8* 8.5*  MG 1.9  --   --   --  1.8 2.0  --    < > = values in this interval not displayed.   Liver Function Tests: Recent Labs  Lab 01/14/24 1224 01/15/24 0543  AST 26 34  ALT 20 22  ALKPHOS 56 68  BILITOT 0.9 1.1  PROT 6.5 6.2*  ALBUMIN 3.9 3.6   CBG: Recent Labs  Lab 01/14/24 1220  GLUCAP 120*    Discharge time spent: greater than 30 minutes.  Signed: Danette Duos, MD [facility] 01/18/2024

## 2024-01-18 NOTE — TOC Transition Note (Signed)
 Transition of Care [facility]) - Discharge Note   Patient Details  Name: Stacy [name] MRN: [id number] Date of Birth: 04/14/1941  [facility] CM/SW Contact:  Jonathan Neighbor, RN Phone Number: 01/18/2024, 10:24 AM   Clinical Narrative:     Pt is discharging to [facility] today. She will be in room 22. She will transport via [facility]. CM has left voicemail for her niece about dc to SNF today.   Number for report: [phone] ext [phone]  Final next level of care: Skilled Nursing Facility Barriers to Discharge: No Barriers Identified   Patient Goals and CMS Choice   CMS Medicare.gov Compare Post Acute Care list provided to:: Patient Choice offered to / list presented to : Patient      Discharge Placement              Patient [facility] bed at: [facility] Patient to be transferred to facility by: [facility] Name of family member notified: Amalia Badder Patient and family notified of of transfer: 01/18/24  Discharge Plan and Services Additional resources added to the After Visit Summary for     Discharge Planning Services: CM Consult                                 Social Drivers of Health (SDOH) Interventions SDOH Screenings   Food Insecurity: No Food Insecurity (01/14/2024)  Housing: Low Risk  (01/14/2024)  Transportation Needs: No Transportation Needs (01/14/2024)  Utilities: Not At Risk (01/14/2024)  Depression (PHQ2-9): Low Risk  (02/09/2020)  Social Connections: Unknown (01/14/2024)  Tobacco Use: Low Risk  (01/02/2024)     Readmission Risk Interventions     No data to display

## 2024-01-18 NOTE — Progress Notes (Signed)
 PT Cancellation Note  Patient Details Name: Stacy [name] MRN: [id number] DOB: [birth year]   Cancelled Treatment:    Reason Eval/Treat Not Completed: Other (comment); attempted twice to see pt.  Initially waiting on meds and breakfast.  Now reports plans for d/c to [facility] for rehab possibly within the hour.  PT follow up planned at rehab facility at [facility].    Marley Simmers 01/18/2024, 12:19 PM Abigail Hoff, PT Acute Rehabilitation Services Office:[phone] 01/18/2024

## 2024-01-18 NOTE — Progress Notes (Unsigned)
 Provider:  Dr. Tye Gall Location:  [facility] Place of Service:  SNF (31)  PCP: Chares Commons, PA-C Patient Care Team: Murry Art as PCP - General (Physician Assistant) [name], MD as PCP - Electrophysiology (Cardiology) [name] [name], MD as PCP - Cardiology (Cardiology) [name], MD as Consulting Physician (Cardiology)  Extended Emergency Contact Information Primary Emergency Contact: [name], [place] [address] [place] Home Phone: [phone] Mobile Phone: [phone] Relation: Niece  Goals of Care: Advanced Directive information    01/14/2024    5:00 PM  Advanced Directives  Does Patient Have a Medical Advance Directive? Yes  Type of Advance Directive Living will;Out of facility DNR (pink MOST or yellow form)  Does patient want to make changes to medical advance directive? No - Patient declined      Chief Complaint  Patient presents with   New Admit To SNF       History of Present Illness   83 year old female with a past medical history of fall approximately A-fib, carotid artery stenosis, hyperlipidemia, hypertension, hypothyroidism, generalized anxiety disorder is seen today for admission to skilled care nursing after recent hospitalization for acute cerebrovascular accident..  Patient seen and examined in her room.  She is laying on her bed.  Patient is able to speak in full sentences, does not appear to be in distress. She states the weakness pain in her left arm and left leg.  Denies coughing or choking while eating. Patient denies chest pain, shortness of breath, abdominal pain, nausea, vomiting, dizzy, lightheadedness. Staff reported that they noticed all extensive rash on her back and perineal area.  No report was received from the hospital. Patient denies itching, swelling of the lips, swelling of the tongue.   As per d/c summary  Hospital  Course: 83 year old with past medical history significant for paroxysmal A-fib on Eliquis , carotid artery stenosis, hyperlipidemia, hypertension, hypothyroidism, history presented to the ED with left-sided weakness acute onset, code stroke was activated.  Patient was found to have left-sided, dysarthria, left facial droop.     Assessment and Plan: 1-Acute CVA, Right Thalamu. Patient was found to have left-sided, dysarthria, left facial droop. -CT head: No evidence of acute intracranial abnormality.  Mild chronic small vessel ischemic disease - CT angio head and neck: No large vessel occlusion.  Intracranial atherosclerosis supply right M1 and right P2 stenosis.  Cervical carotid atherosclerosis without significant stenosis of the common carotid or internal carotid artery.  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  A small bleed within the lesion which may have reflect developing petechial hemorrhage. Chronic microhemorrhage in the posterior left frontal lobe.  -ECHO:Ef 65 %, No wall motion abnormalities.  -On aspirin . Eliquis  initially held  due to micro hemorrhage.  -LD 128, A 1c: 5.0 Started  Lipitor.  Neurology recommend Aspirin  and Eliquis  for secondary stroke prevention.  Stable for discharge   She has had history of hyponatremia in the past thought to be related to irbesartan .  Combination SIADH and dehydration.  Responded initially to NS IV fluids suggesting component of dehydration. .  Continue with Water restriction. 1L Urine Osmolality 500 range. Suggesting SIADH   Past Medical History:  Diagnosis Date   Atrial fibrillation (HCC)    echo 12/23/10 EF= >55%, stress myoview 01/30/11 normal pattern of perfusion in all myocardial regions   Carotid artery stenosis  Per [facility] new patient packet    Chicken pox    Colon polyp    Colon polyp    Dyslipidemia    Heart murmur    Hiatal hernia    Hypertension    Hypothyroidism     Laryngopharyngeal reflux    Osteopenia    Scoliosis    Seasonal allergies    Thyroid  disease    Vitamin D  deficiency    Past Surgical History:  Procedure Laterality Date   BREAST CYST EXCISION Right 1988   ESOPHAGEAL MANOMETRY N/A 08/29/2015   Procedure: ESOPHAGEAL MANOMETRY (EM);  Surgeon: Danette Duos, MD;  Location: [facility];  Service: Gastroenterology;  Laterality: N/A;   EYE SURGERY     Cataract eye surgery-right 06/2014, left 04/2014   ORIF PATELLA Right 07/08/2022   Procedure: OPEN REDUCTION INTERNAL FIXATION (ORIF) PATELLA;  Surgeon: Osa Blase, MD;  Location: [facility];  Service: Orthopedics;  Laterality: Right;   TONSILLECTOMY     Childhood    reports that she has never smoked. She has never used smokeless tobacco. She reports that she does not currently use alcohol after a past usage of about 1.0 standard drink of alcohol per week. She reports that she does not use drugs. Social History   Socioeconomic History   Marital status: Single    Spouse name: Not on file   Number of children: Not on file   Years of education: Not on file   Highest education level: Not on file  Occupational History   Occupation: retired  Tobacco Use   Smoking status: Never   Smokeless tobacco: Never  Substance and Sexual Activity   Alcohol use: Not Currently    Alcohol/week: 1.0 standard drink of alcohol    Types: 1 Glasses of wine per week    Comment: 1 glass of wine 3x a week   Drug use: No   Sexual activity: Not on file  Other Topics Concern   Not on file  Social History Narrative   Work or School: Clinical research associate - poetry      Home Situation: lives alone      Spiritual Beliefs: Jewish      Lifestyle: [facility]; diet healthy      Per [facility] New Patient Packet:      Diet: Heart healthy, low-fat      Caffeine: 1 cup of tea daily       Married, if yes what year: Single      Do you live in a house, apartment, assisted living, condo,  trailer, ect: Apartment, 1 person, 7 stories       Pets: No      Current/Past profession: PHD, Child psychotherapist      Exercise: Yes, Treadmill, weights, and walking          Living Will: Yes   DNR: Yes   POA/HPOA: Yes      Functional Status:   Do you have difficulty bathing or dressing yourself? No   Do you have difficulty preparing food or eating? No   Do you have difficulty managing your medications? No   Do you have difficulty managing your finances? No   Do you have difficulty affording your medications? No         Social Drivers of Corporate investment banker Strain: Not on file  Food Insecurity: No Food Insecurity (01/14/2024)   Hunger Vital Sign    Worried About Running Out of Food in  the Last Year: Never true    Ran Out of Food in the Last Year: Never true  Transportation Needs: No Transportation Needs (01/14/2024)   PRAPARE - Administrator, Civil Service (Medical): No    Lack of Transportation (Non-Medical): No  Physical Activity: Not on file  Stress: Not on file  Social Connections: Unknown (01/14/2024)   Social Connection and Isolation Panel    Frequency of Communication with Friends and Family: More than three times a week    Frequency of Social Gatherings with Friends and Family: Three times a week    Attends Religious Services: 1 to 4 times per year    Active Member of Clubs or Organizations: Yes    Attends Banker Meetings: More than 4 times per year    Marital Status: Not on file  Intimate Partner Violence: Not At Risk (01/14/2024)   Humiliation, Afraid, Rape, and Kick questionnaire    Fear of Current or Ex-Partner: No    Emotionally Abused: No    Physically Abused: No    Sexually Abused: No    Functional Status Survey:    Family History  Problem Relation Age of Onset   CVA Mother    Hyperlipidemia Mother    Hypertension Mother    Anuerysm Father        brain   Stroke Maternal Grandmother    Hypertension Maternal  Grandmother    Hypertension Sister    Scoliosis Sister    Colon cancer Neg Hx    Heart attack Neg Hx    Breast cancer Neg Hx     Health Maintenance  Topic Date Due   Zoster Vaccines- Shingrix (1 of 2) 10/07/1959   DTaP/Tdap/Td (2 - Td or Tdap) 08/04/2020   COVID-19 Vaccine (5 - 2024-25 season) 04/05/2023   Medicare Annual Wellness (AWV)  05/27/2023   INFLUENZA VACCINE  03/04/2024   Pneumococcal Vaccine: 50+ Years  Completed   HPV VACCINES  Aged Out   Meningococcal B Vaccine  Aged Out   DEXA SCAN  Discontinued    Allergies  Allergen Reactions   Amoxicillin Swelling   Flonase [Fluticasone Propionate] Swelling    Swelling of throat per patient    Sulfa Antibiotics Other (See Comments)    Upset stomach   Sulfasalazine Other (See Comments)    Upset stomach    Outpatient Encounter Medications as of 01/18/2024  Medication Sig   aspirin  EC 81 MG tablet Take 1 tablet (81 mg total) by mouth daily. Swallow whole.   atorvastatin (LIPITOR) 40 MG tablet Take 1 tablet (40 mg total) by mouth daily.   Cholecalciferol  (VITAMIN D -3) 1000 UNITS CAPS Take 1,000 Units by mouth in the morning.   Cranberry-Vit C-Lactobacillus (RA CRANBERRY SUPPLEMENTS PO) Take 2 tablets by mouth every evening.   diltiazem  (CARTIA  XT) 180 MG 24 hr capsule Take 1 capsule (180 mg total) by mouth at bedtime. (Patient taking differently: Take 180 mg by mouth every morning.)   ELIQUIS  2.5 MG TABS tablet Take 2.5 mg by mouth 2 (two) times daily.   Famotidine (ACID REDUCER PO) Take 1 tablet by mouth at bedtime as needed.   lactulose (CHRONULAC) 10 GM/15ML solution Take 30 mLs (20 g total) by mouth daily.   levothyroxine  (SYNTHROID ) 125 MCG tablet TAKE 1 TABLET BY MOUTH DAILY BEFORE BREAKFAST.   LORazepam  (ATIVAN ) 0.5 MG tablet Take 1 tablet (0.5 mg total) by mouth 2 (two) times daily as needed for anxiety. (Patient taking differently: Take 0.5  mg by mouth at bedtime.)   Multiple Vitamin (MULTIVITAMINS PO) Take 2 each by  mouth every evening. Gummies   Multiple Vitamins-Minerals (PRESERVISION AREDS PO) Take 1 tablet by mouth at bedtime.   Probiotic CHEW Chew 1 each by mouth every evening.   Simethicone (GAS-X PO) Take 1 tablet by mouth as needed.   Wheat Dextrin (BENEFIBER PO) Take 0.5 Scoops by mouth as needed.   Facility-Administered Encounter Medications as of 01/18/2024  Medication   acetaminophen  (TYLENOL ) tablet 650 mg   Or   acetaminophen  (TYLENOL ) suppository 650 mg   acidophilus (RISAQUAD) capsule 1 capsule   apixaban  (ELIQUIS ) tablet 2.5 mg   aspirin  EC tablet 81 mg   atorvastatin (LIPITOR) tablet 40 mg   bisacodyl (DULCOLAX) EC tablet 5 mg   cholecalciferol  (VITAMIN D3) 25 MCG (1000 UNIT) tablet 1,000 Units   diltiazem  (CARDIZEM  CD) 24 hr capsule 180 mg   fiber (NUTRISOURCE FIBER) 1 packet   hydrALAZINE  (APRESOLINE ) injection 10 mg   lactulose (CHRONULAC) 10 GM/15ML solution 20 g   levothyroxine  (SYNTHROID ) tablet 125 mcg   LORazepam  (ATIVAN ) tablet 0.5 mg   LORazepam  (ATIVAN ) tablet 0.5 mg   meclizine (ANTIVERT) tablet 12.5 mg   ondansetron  (ZOFRAN ) injection 4 mg   sodium chloride  flush (NS) 0.9 % injection 3 mL    Review of Systems  Constitutional:  Negative for chills and fever.  Respiratory:  Negative for cough, shortness of breath and wheezing.   Cardiovascular:  Negative for chest pain, palpitations and leg swelling.  Gastrointestinal:  Negative for abdominal distention, abdominal pain, blood in stool, constipation, diarrhea, nausea and vomiting.  Genitourinary:  Negative for dysuria, frequency and urgency.  Skin:  Positive for rash.  Neurological:  Negative for dizziness.    There were no vitals filed for this visit. There is no height or weight on file to calculate BMI. BP Readings from Last 3 Encounters:  01/18/24 (!) 149/87  01/03/24 (!) 146/95  12/30/23 (!) 144/80   Wt Readings from Last 3 Encounters:  01/18/24 115 lb 11.9 oz (52.5 kg)  01/02/24 114 lb 13.8 oz  (52.1 kg)  12/30/23 119 lb (54 kg)   Physical Exam Constitutional:      Appearance: Normal appearance.  HENT:     Head: Normocephalic and atraumatic.   Cardiovascular:     Rate and Rhythm: Normal rate and regular rhythm.  Pulmonary:     Effort: Pulmonary effort is normal. No respiratory distress.     Breath sounds: Normal breath sounds. No wheezing.  Abdominal:     General: Bowel sounds are normal. There is no distension.     Tenderness: There is no abdominal tenderness. There is no guarding or rebound.     Comments:     Musculoskeletal:        General: No swelling or tenderness.   Skin:    Comments: Papular rash extensively on her back   Slight rash noted on her lower extremities    Neurological:     Mental Status: She is alert. Mental status is at baseline.     Comments: Left side weaker than rt side     Labs reviewed: Basic Metabolic Panel: Recent Labs    01/02/24 2248 01/03/24 0214 01/14/24 1224 01/14/24 1226 01/16/24 1819 01/17/24 1129 01/18/24 0443  NA  --    < > 129*   < > 127* 127* 128*  K  --    < > 3.9   < > 3.3* 4.1 3.8  CL  --    < > 96*   < > 91* 93* 95*  CO2  --    < > 23   < > 25 25 25   GLUCOSE  --    < > 113*   < > 92 109* 88  BUN  --    < > 19   < > 16 11 17   CREATININE  --    < > 0.89   < > 0.70 0.56 0.63  CALCIUM  --    < > 9.2   < > 8.7* 8.8* 8.5*  MG 1.9  --  1.9  --  1.8 2.0  --   PHOS 3.1  --   --   --   --   --   --    < > = values in this interval not displayed.   Liver Function Tests: Recent Labs    01/02/24 1208 01/14/24 1224 01/15/24 0543  AST 24 26 34  ALT 20 20 22   ALKPHOS 64 56 68  BILITOT 0.9 0.9 1.1  PROT 6.2* 6.5 6.2*  ALBUMIN 3.8 3.9 3.6   No results for input(s): LIPASE, AMYLASE in the last 8760 hours. No results for input(s): AMMONIA in the last 8760 hours. CBC: Recent Labs    01/14/24 1224 01/14/24 1226 01/15/24 0543 01/16/24 1111  WBC 5.4  --  5.9 6.7  NEUTROABS 3.9  --   --   --   HGB 13.6 13.6  13.8 14.4  HCT 38.8 40.0 39.1 40.5  MCV 93.9  --  93.5 92.0  PLT 153  --  165 154   Cardiac Enzymes: No results for input(s): CKTOTAL, CKMB, CKMBINDEX, TROPONINI in the last 8760 hours. BNP: Invalid input(s): POCBNP Lab Results  Component Value Date   HGBA1C 5.0 01/15/2024   Lab Results  Component Value Date   TSH 5.952 (H) 01/14/2024   Lab Results  Component Value Date   VITAMINB12 508 01/03/2024   Lab Results  Component Value Date   FOLATE 20.4 01/03/2024   No results found for: IRON, TIBC, FERRITIN  Imaging and Procedures obtained prior to SNF admission: MR BRAIN WO CONTRAST Result Date: 01/14/2024 CLINICAL DATA:  Stroke follow-up. EXAM: MRI HEAD WITHOUT CONTRAST TECHNIQUE: Multiplanar, multiecho pulse sequences of the brain and surrounding structures were obtained without intravenous contrast. COMPARISON:  CT head and CTA head and neck earlier same day. FINDINGS: Brain: 10 mm focus of restricted diffusion in the right thalamus compatible with acute infarct. Chronic microhemorrhage in the posterior left frontal lobe. Small focus of susceptibility in the right thalamus corresponding to the region of infarct which may reflect petechial hemorrhage. T2/FLAIR hyperintensity in the periventricular and subcortical white matter. Mild parenchymal volume loss. No edema, mass effect, or midline shift. Posterior fossa is unremarkable. Normal appearance of midline structures. The basilar cisterns are patent. No extra-axial fluid collections. Ventricles: Normal size and configuration of the ventricles. Vascular: Skull base flow voids are visualized. Skull and upper cervical spine: No focal abnormality. Sinuses/Orbits: Bilateral lens replacement. Paranasal sinuses are clear. Other: Mastoid air cells are clear. IMPRESSION: Acute infarct in the right thalamus. Small focus of susceptibility within this region which may reflect developing petechial hemorrhage. Recommend short interval  follow-up head CT. No significant edema or midline shift. Mild chronic microvascular ischemic changes and mild parenchymal volume loss. Chronic microhemorrhage in the posterior left frontal lobe. Electronically Signed   By: Denny Flack M.D.   On: 01/14/2024 20:21  ECHOCARDIOGRAM COMPLETE Result Date: 01/14/2024    ECHOCARDIOGRAM REPORT   Patient Name:   Stacy [name] Date of Exam: 01/14/2024 Medical Rec #:  [id number]       Height:       65.0 in Accession #:    [id number]      Weight:       125.7 lb Date of Birth:  1940/11/02        BSA:          1.624 m Patient Age:    83 years        BP:           169/84 mmHg Patient Gender: F               HR:           74 bpm. Exam Location:  Inpatient Procedure: 2D Echo, Cardiac Doppler and Color Doppler (Both Spectral and Color            Flow Doppler were utilized during procedure). Indications:    Stroke  History:        Patient has prior history of Echocardiogram examinations, most                 recent 05/11/2013. Aortic Valve Disease, Arrythmias:Atrial                 Fibrillation, Signs/Symptoms:Murmur; Risk Factors:Hypertension.  Sonographer:    Astrid Blamer Referring Phys: Alcide Humble PATEL IMPRESSIONS  1. Left ventricular ejection fraction, by estimation, is 65 to 70%. The left ventricle has normal function. The left ventricle has no regional wall motion abnormalities. There is mild left ventricular hypertrophy. Left ventricular diastolic function could not be evaluated.  2. Right ventricular systolic function is normal. The right ventricular size is normal.  3. Left atrial size was severely dilated.  4. The mitral valve is abnormal. Trivial mitral valve regurgitation. Mild mitral stenosis. Severe mitral annular calcification.  5. The aortic valve is calcified. There is moderate calcification of the aortic valve. There is moderate thickening of the aortic valve. Aortic valve regurgitation is mild to moderate. Mild aortic valve stenosis. Comparison(s): Prior images  unable to be directly viewed, comparison made by report only. Conclusion(s)/Recommendation(s): EF unchanged, mild mitral stenosis, mild aortic stenosis. FINDINGS  Left Ventricle: Left ventricular ejection fraction, by estimation, is 65 to 70%. The left ventricle has normal function. The left ventricle has no regional wall motion abnormalities. The left ventricular internal cavity size was normal in size. There is  mild left ventricular hypertrophy. Left ventricular diastolic function could not be evaluated due to mitral annular calcification (moderate or greater). Left ventricular diastolic function could not be evaluated. Right Ventricle: The right ventricular size is normal. No increase in right ventricular wall thickness. Right ventricular systolic function is normal. Left Atrium: Left atrial size was severely dilated. Right Atrium: Right atrial size was normal in size. Pericardium: There is no evidence of pericardial effusion. Mitral Valve: The mitral valve is abnormal. Severe mitral annular calcification. Trivial mitral valve regurgitation. Mild mitral valve stenosis. MV peak gradient, 16.8 mmHg. The mean mitral valve gradient is 6.0 mmHg. Tricuspid Valve: The tricuspid valve is normal in structure. Tricuspid valve regurgitation is trivial. No evidence of tricuspid stenosis. Aortic Valve: The aortic valve is calcified. There is moderate calcification of the aortic valve. There is moderate thickening of the aortic valve. Aortic valve regurgitation is mild to moderate. Mild aortic stenosis is present. Aortic valve mean gradient measures 17.0 mmHg.  Aortic valve peak gradient measures 36.5 mmHg. Aortic valve area, by VTI measures 1.43 cm. Pulmonic Valve: The pulmonic valve was not well visualized. Pulmonic valve regurgitation is not visualized. No evidence of pulmonic stenosis. Aorta: The aortic root is normal in size and structure. Venous: The inferior vena cava was not well visualized. IAS/Shunts: The atrial  septum is grossly normal.  LEFT VENTRICLE PLAX 2D LVIDd:         3.10 cm   Diastology LVIDs:         1.50 cm   LV e' medial:    5.87 cm/s LV PW:         1.10 cm   LV E/e' medial:  17.9 LV IVS:        1.27 cm   LV e' lateral:   7.72 cm/s LVOT diam:     1.80 cm   LV E/e' lateral: 13.6 LV SV:         96 LV SV Index:   59 LVOT Area:     2.54 cm  RIGHT VENTRICLE RV S prime:     13.30 cm/s TAPSE (M-mode): 2.1 cm LEFT ATRIUM             Index        RIGHT ATRIUM          Index LA Vol (A2C):   64.3 ml 39.60 ml/m  RA Area:     7.20 cm LA Vol (A4C):   85.1 ml 52.41 ml/m  RA Volume:   10.40 ml 6.41 ml/m LA Biplane Vol: 77.8 ml 47.92 ml/m  AORTIC VALVE AV Area (Vmax):    1.39 cm AV Area (Vmean):   1.58 cm AV Area (VTI):     1.43 cm AV Vmax:           302.00 cm/s AV Vmean:          187.000 cm/s AV VTI:            0.669 m AV Peak Grad:      36.5 mmHg AV Mean Grad:      17.0 mmHg LVOT Vmax:         165.00 cm/s LVOT Vmean:        116.000 cm/s LVOT VTI:          0.376 m LVOT/AV VTI ratio: 0.56  AORTA Ao Root diam: 3.00 cm MITRAL VALVE MV Area (PHT): 3.27 cm     SHUNTS MV Area VTI:   2.28 cm     Systemic VTI:  0.38 m MV Peak grad:  16.8 mmHg    Systemic Diam: 1.80 cm MV Mean grad:  6.0 mmHg MV Vmax:       2.05 m/s MV Vmean:      115.0 cm/s MV Decel Time: 232 msec MV E velocity: 105.00 cm/s MV A velocity: 179.00 cm/s MV E/A ratio:  0.59 Sheryle Donning MD Electronically signed by Sheryle Donning MD Signature Date/Time: 01/14/2024/7:40:23 PM    Final    CT ANGIO HEAD NECK W WO CM (CODE STROKE) Result Date: 01/14/2024 CLINICAL DATA:  Neuro deficit, acute, stroke suspected. Left-sided weakness. EXAM: CT ANGIOGRAPHY HEAD AND NECK WITH AND WITHOUT CONTRAST TECHNIQUE: Multidetector CT imaging of the head and neck was performed using the standard protocol during bolus administration of intravenous contrast. Multiplanar CT image reconstructions and MIPs were obtained to evaluate the vascular anatomy. Carotid stenosis  measurements (when applicable) are obtained utilizing NASCET criteria, using the distal internal carotid diameter as the  denominator. RADIATION DOSE REDUCTION: This exam was performed according to the departmental dose-optimization program which includes automated exposure control, adjustment of the mA and/or kV according to patient size and/or use of iterative reconstruction technique. CONTRAST:  75mL OMNIPAQUE  IOHEXOL  350 MG/ML SOLN COMPARISON:  None Available. FINDINGS: CTA NECK FINDINGS Aortic arch: Incomplete imaging of the aortic arch, including the origins of the brachiocephalic and left common carotid arteries. The brachiocephalic and subclavian arteries are patent with calcified plaque at the origin of the right subclavian artery resulting in no more than mild stenosis. Right carotid system: Patent with calcified plaque at the carotid bifurcation resulting in severe stenosis of the origin of the external carotid artery. No evidence of a significant stenosis or dissection of the common carotid or cervical internal carotid arteries. Left carotid system: Patent with calcified plaque at the carotid bifurcation. No evidence of a significant stenosis or dissection. Vertebral arteries: Patent without evidence of a significant stenosis or dissection. Moderately dominant left vertebral artery. Skeleton: Widespread advanced cervical disc degeneration. Potentially moderate spinal stenosis at C4-5. Advanced multilevel neural foraminal stenosis. Other neck: No evidence of cervical lymphadenopathy or mass. Upper chest: Clear lung apices. Review of the MIP images confirms the above findings CTA HEAD FINDINGS Anterior circulation: The internal carotid arteries are patent from skull base to carotid termini with mild atherosclerotic irregularity bilaterally but no significant stenosis. ACAs and MCAs are patent without evidence of a proximal branch occlusion or significant A1 or left M1 stenosis. There is a severe proximal  right M1 stenosis, and mild bilateral MCA and ACA branch vessel irregularity is noted. No aneurysm is identified. Posterior circulation: The intracranial vertebral arteries are widely patent to the basilar. Patent AICA and SCA origins are visualized bilaterally. The basilar artery is widely patent. Posterior communicating arteries are diminutive or absent. Both PCAs are patent. There are moderate proximal left P2 and severe distal right P2 stenoses. No aneurysm is identified. Venous sinuses: As permitted by contrast timing, patent. Anatomic variants: None. Review of the MIP images confirms the above findings These results were communicated to Dr. Christiane Cowing at 12:45 pm on 01/14/2024 by text page via the [facility] messaging system. IMPRESSION: 1. No large vessel occlusion. 2. Intracranial atherosclerosis including severe right M1 and right P2 stenoses. 3. Cervical carotid atherosclerosis without a significant stenosis of the common carotid or internal carotid arteries. 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 Electronically Signed   By: Aundra Lee M.D.   On: 01/14/2024 13:02   CT HEAD CODE STROKE WO CONTRAST Result Date: 01/14/2024 CLINICAL DATA:  Code stroke. Neuro deficit, acute, stroke suspected. Left-sided weakness. EXAM: CT HEAD WITHOUT CONTRAST TECHNIQUE: Contiguous axial images were obtained from the base of the skull through the vertex without intravenous contrast. RADIATION DOSE REDUCTION: This exam was performed according to the departmental dose-optimization program which includes automated exposure control, adjustment of the mA and/or kV according to patient size and/or use of iterative reconstruction technique. COMPARISON:  None Available. FINDINGS: Brain: There is no evidence of an acute infarct, intracranial hemorrhage, mass, midline shift, or extra-axial fluid collection. Cerebral volume is within normal limits for age. Patchy hypodensities in the cerebral white matter bilaterally are nonspecific but  compatible with mild chronic small vessel ischemic disease. Vascular: Calcified atherosclerosis at the skull base. No hyperdense vessel. Skull: No fracture or suspicious lesion. Hyperostosis frontalis interna. Sinuses/Orbits: Visualized paranasal sinuses and mastoid air cells are clear. Bilateral cataract extraction. Other: None. ASPECTS [facility] Stroke Program Early CT Score) - Ganglionic  level infarction (caudate, lentiform nuclei, internal capsule, insula, M1-M3 cortex): 7 - Supraganglionic infarction (M4-M6 cortex): 3 Total score (0-10 with 10 being normal): 10 IMPRESSION: 1. No evidence of acute intracranial abnormality. ASPECTS of 10. 2. Mild chronic small vessel ischemic disease. These results were communicated to Dr. Christiane Cowing at 12:45 pm on 01/14/2024 by text page via the [facility] messaging system. Electronically Signed   By: Aundra Lee M.D.   On: 01/14/2024 12:46    Assessment and Plan Assessment & Plan   Acute right thymic infarct Patient pleasant and comfortable today On examination weakness noted on her left side MRI brain-Infarct in the right thalamus. A small bleed within the lesion which may have reflect developing petechial hemorrhage. Chronic microhemorrhage in the posterior left frontal lobe.  Echo-ejection fraction 65% Continue with aspirin , Eliquis , Lipitor Follow-up with neurology in 2 weeks Will order PT/OT, speech therapy   Rash Patient noted with a papular rash on her back Denies itching, Will start prednisone 40 mg Patient denies swelling of the lips, tongue Will monitor for worsening symptoms Vitals every shift  History of atrial fibrillation Continue with the Cardizem , Eliquis .  History of hyponatremia Continue with water restriction Recheck BMP next week   History of hypothyroidism Will check TSH in 6 weeks Continue with the Synthyroid  History of anxiety disorder Continue with the lorazepam  as needed   35 min Total time spent for obtaining history,   performing a medically appropriate examination and evaluation, reviewing the tests,ordering  tests,  documenting clinical information in the electronic or other health record, ,care coordination (not separately reported)

## 2024-01-18 NOTE — Progress Notes (Signed)
 Pt wheeled off unit by PTAR. Report called to SNF by Virtual RN. All belongings packed up and taken wih PTAR. Pt requested to place her bottom partials and get on [facility] which this RN assisted with, CCMD notified of DC and IV removed.

## 2024-01-18 NOTE — Care Management Important Message (Signed)
 Important Message  Patient Details  Name: Stacy [name] MRN: [id number] Date of Birth: 1940-12-03   Important Message Given:  Yes - Medicare IM     Wynonia Hedges 01/18/2024, 4:25 PM

## 2024-01-18 NOTE — Plan of Care (Signed)
 Problem: Education: Goal: Knowledge of disease or condition will improve 01/18/2024 0510 by Brant Caldron, RN Outcome: Progressing 01/18/2024 0422 by Brant Caldron, RN Outcome: Progressing Goal: Knowledge of secondary prevention will improve (MUST DOCUMENT ALL) 01/18/2024 0510 by Brant Caldron, RN Outcome: Progressing 01/18/2024 0422 by Brant Caldron, RN Outcome: Progressing Goal: Knowledge of patient specific risk factors will improve (DELETE if not current risk factor) 01/18/2024 0510 by Brant Caldron, RN Outcome: Progressing 01/18/2024 0422 by Brant Caldron, RN Outcome: Progressing   Problem: Ischemic Stroke/TIA Tissue Perfusion: Goal: Complications of ischemic stroke/TIA will be minimized 01/18/2024 0510 by Brant Caldron, RN Outcome: Progressing 01/18/2024 0422 by Brant Caldron, RN Outcome: Progressing   Problem: Coping: Goal: Will verbalize positive feelings about self 01/18/2024 0510 by Brant Caldron, RN Outcome: Progressing 01/18/2024 0422 by Brant Caldron, RN Outcome: Progressing Goal: Will identify appropriate support needs 01/18/2024 0510 by Brant Caldron, RN Outcome: Progressing 01/18/2024 0422 by Brant Caldron, RN Outcome: Progressing   Problem: Health Behavior/Discharge Planning: Goal: Ability to manage health-related needs will improve 01/18/2024 0510 by Brant Caldron, RN Outcome: Progressing 01/18/2024 0422 by Brant Caldron, RN Outcome: Progressing Goal: Goals will be collaboratively established with patient/family 01/18/2024 0510 by Brant Caldron, RN Outcome: Progressing 01/18/2024 0422 by Brant Caldron, RN Outcome: Progressing   Problem: Self-Care: Goal: Ability to participate in self-care as condition permits will improve 01/18/2024 0510 by Brant Caldron, RN Outcome: Progressing 01/18/2024 0422 by Brant Caldron, RN Outcome: Progressing Goal: Verbalization of feelings and concerns over difficulty with  self-care will improve 01/18/2024 0510 by Brant Caldron, RN Outcome: Progressing 01/18/2024 0422 by Brant Caldron, RN Outcome: Progressing Goal: Ability to communicate needs accurately will improve 01/18/2024 0510 by Brant Caldron, RN Outcome: Progressing 01/18/2024 0422 by Brant Caldron, RN Outcome: Progressing   Problem: Nutrition: Goal: Risk of aspiration will decrease 01/18/2024 0510 by Brant Caldron, RN Outcome: Progressing 01/18/2024 0422 by Brant Caldron, RN Outcome: Progressing Goal: Dietary intake will improve 01/18/2024 0510 by Brant Caldron, RN Outcome: Progressing 01/18/2024 0422 by Brant Caldron, RN Outcome: Progressing   Problem: Education: Goal: Knowledge of condition and prescribed therapy will improve 01/18/2024 0510 by Brant Caldron, RN Outcome: Progressing 01/18/2024 0422 by Brant Caldron, RN Outcome: Progressing   Problem: Cardiac: Goal: Will achieve and/or maintain adequate cardiac output 01/18/2024 0510 by Brant Caldron, RN Outcome: Progressing 01/18/2024 0422 by Brant Caldron, RN Outcome: Progressing   Problem: Physical Regulation: Goal: Complications related to the disease process, condition or treatment will be avoided or minimized 01/18/2024 0510 by Brant Caldron, RN Outcome: Progressing 01/18/2024 0422 by Brant Caldron, RN Outcome: Progressing   Problem: Education: Goal: Knowledge of General Education information will improve Description: Including pain rating scale, medication(s)/side effects and non-pharmacologic comfort measures 01/18/2024 0510 by Brant Caldron, RN Outcome: Progressing 01/18/2024 0422 by Brant Caldron, RN Outcome: Progressing   Problem: Health Behavior/Discharge Planning: Goal: Ability to manage health-related needs will improve 01/18/2024 0510 by Brant Caldron, RN Outcome: Progressing 01/18/2024 0422 by Brant Caldron, RN Outcome: Progressing   Problem: Clinical  Measurements: Goal: Ability to maintain clinical measurements within normal limits will improve 01/18/2024 0510 by Brant Caldron, RN Outcome: Progressing 01/18/2024 0422 by Brant Caldron, RN Outcome: Progressing Goal: Will remain free from infection 01/18/2024 0510 by Brant Caldron, RN Outcome: Progressing 01/18/2024 0422 by Arvel Lather,  Vernon Goodpasture, RN Outcome: Progressing Goal: Diagnostic test results will improve 01/18/2024 0510 by Brant Caldron, RN Outcome: Progressing 01/18/2024 0422 by Brant Caldron, RN Outcome: Progressing Goal: Respiratory complications will improve 01/18/2024 0510 by Brant Caldron, RN Outcome: Progressing 01/18/2024 0422 by Brant Caldron, RN Outcome: Progressing Goal: Cardiovascular complication will be avoided 01/18/2024 0510 by Brant Caldron, RN Outcome: Progressing 01/18/2024 0422 by Brant Caldron, RN Outcome: Progressing   Problem: Activity: Goal: Risk for activity intolerance will decrease 01/18/2024 0510 by Brant Caldron, RN Outcome: Progressing 01/18/2024 0422 by Brant Caldron, RN Outcome: Progressing   Problem: Nutrition: Goal: Adequate nutrition will be maintained 01/18/2024 0510 by Brant Caldron, RN Outcome: Progressing 01/18/2024 0422 by Brant Caldron, RN Outcome: Progressing   Problem: Coping: Goal: Level of anxiety will decrease 01/18/2024 0510 by Brant Caldron, RN Outcome: Progressing 01/18/2024 0422 by Brant Caldron, RN Outcome: Progressing   Problem: Elimination: Goal: Will not experience complications related to bowel motility 01/18/2024 0510 by Brant Caldron, RN Outcome: Progressing 01/18/2024 0422 by Brant Caldron, RN Outcome: Progressing Goal: Will not experience complications related to urinary retention 01/18/2024 0510 by Brant Caldron, RN Outcome: Progressing 01/18/2024 0422 by Brant Caldron, RN Outcome: Progressing   Problem: Pain Managment: Goal: General experience of comfort  will improve and/or be controlled 01/18/2024 0510 by Brant Caldron, RN Outcome: Progressing 01/18/2024 0422 by Brant Caldron, RN Outcome: Progressing   Problem: Safety: Goal: Ability to remain free from injury will improve 01/18/2024 0510 by Brant Caldron, RN Outcome: Progressing 01/18/2024 0422 by Brant Caldron, RN Outcome: Progressing   Problem: Skin Integrity: Goal: Risk for impaired skin integrity will decrease 01/18/2024 0510 by Brant Caldron, RN Outcome: Progressing 01/18/2024 0422 by Brant Caldron, RN Outcome: Progressing

## 2024-01-21 ENCOUNTER — Non-Acute Institutional Stay (SKILLED_NURSING_FACILITY): Payer: Self-pay | Admitting: Nurse Practitioner

## 2024-01-21 ENCOUNTER — Encounter: Payer: Self-pay | Admitting: Nurse Practitioner

## 2024-01-21 DIAGNOSIS — R531 Weakness: Secondary | ICD-10-CM

## 2024-01-21 DIAGNOSIS — F419 Anxiety disorder, unspecified: Secondary | ICD-10-CM | POA: Diagnosis not present

## 2024-01-21 DIAGNOSIS — E039 Hypothyroidism, unspecified: Secondary | ICD-10-CM | POA: Diagnosis not present

## 2024-01-21 DIAGNOSIS — K1379 Other lesions of oral mucosa: Secondary | ICD-10-CM | POA: Diagnosis not present

## 2024-01-21 DIAGNOSIS — N3020 Other chronic cystitis without hematuria: Secondary | ICD-10-CM | POA: Diagnosis not present

## 2024-01-21 DIAGNOSIS — E785 Hyperlipidemia, unspecified: Secondary | ICD-10-CM

## 2024-01-21 DIAGNOSIS — E871 Hypo-osmolality and hyponatremia: Secondary | ICD-10-CM | POA: Diagnosis not present

## 2024-01-21 DIAGNOSIS — I10 Essential (primary) hypertension: Secondary | ICD-10-CM

## 2024-01-21 DIAGNOSIS — I480 Paroxysmal atrial fibrillation: Secondary | ICD-10-CM

## 2024-01-21 NOTE — Assessment & Plan Note (Signed)
 GAD/insomnia, on Lorazepam 

## 2024-01-21 NOTE — Assessment & Plan Note (Signed)
 Blood pressure is controlled, on Diltiazem . Bun/creat 17/0.63 01/18/24

## 2024-01-21 NOTE — Assessment & Plan Note (Signed)
 Chronic cystitis/urinary frequency, chronic.

## 2024-01-21 NOTE — Assessment & Plan Note (Signed)
 sores in mouth from partial denture, requesting Oragel.

## 2024-01-21 NOTE — Progress Notes (Unsigned)
 Location:   [facility]  Nursing Home Room Number: 22-A Place of Service:  SNF (31) Provider:  Rielle Schlauch, NP  PCP: Chares Commons, PA-C  Patient Care Team: Murry Art as PCP - General (Physician Assistant) [name], MD as PCP - Electrophysiology (Cardiology) [name] [name], MD as PCP - Cardiology (Cardiology) [name], MD as Consulting Physician (Cardiology)  Extended Emergency Contact Information Primary Emergency Contact: [name], [name] [address] [place] Home Phone: [phone] Mobile Phone: [phone] Relation: Niece  Code Status:  FULL CODE Goals of care: Advanced Directive information    01/21/2024   11:08 AM  Advanced Directives  Does Patient Have a Medical Advance Directive? Yes  Type of Estate agent of [place];Living will  Does patient want to make changes to medical advance directive? No - Patient declined  Copy of Healthcare Power of Attorney in Chart? Yes - validated most recent copy scanned in chart (See row information)     Chief Complaint  Patient presents with  . Mouth Lesions    Sore in mouth    HPI:  Pt is a 83 y.o. female seen today for an acute visit for sores in mouth from partial denture, requesting [place].   CVA, right thalamus MRI brain,  with left sided weakness and dysarthria  hospitalized 01/14/24-01/18/24, f/u stroke clinic/neurology. 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 Na 128 01/18/24  GAD/insomnia, on Lorazepam   HTN, on Diltiazem . Bun/creat 17/0.63 01/18/24  Afib, on Eliquis , Diltiazem   Chronic cystitis/urinary frequency.   Hypothyroidism, TSH 5.9, on Levothyroxine .   HLD/carotid artery stenosis, on Atorvastatin, LDL 128  01/15/24  IBS/constipation, managed.      Past Medical History:  Diagnosis Date  . Atrial fibrillation (HCC)    echo 12/23/10 EF= >55%, stress myoview 01/30/11 normal pattern of perfusion in all myocardial regions  . Carotid artery stenosis    Per [facility] new patient packet   . Chicken pox   . Colon polyp   . Colon polyp   . Dyslipidemia   . Heart murmur   . Hiatal hernia   . Hypertension   . Hypothyroidism   . Laryngopharyngeal reflux   . Osteopenia   . Scoliosis   . Seasonal allergies   . Thyroid  disease   . Vitamin D  deficiency    Past Surgical History:  Procedure Laterality Date  . BREAST CYST EXCISION Right 1988  . ESOPHAGEAL MANOMETRY N/A 08/29/2015   Procedure: ESOPHAGEAL MANOMETRY (EM);  Surgeon: Danette Duos, MD;  Location: [facility];  Service: Gastroenterology;  Laterality: N/A;  . EYE SURGERY     Cataract eye surgery-right 06/2014, left 04/2014  . ORIF PATELLA Right 07/08/2022   Procedure: OPEN REDUCTION INTERNAL FIXATION (ORIF) PATELLA;  Surgeon: Osa Blase, MD;  Location: [facility];  Service: Orthopedics;  Laterality: Right;  . TONSILLECTOMY     Childhood    Allergies  Allergen Reactions  . Amoxicillin Swelling  . Flonase [Fluticasone Propionate] Swelling    Swelling of throat per patient   . Sulfa Antibiotics Other (See Comments)    Upset stomach  . Sulfasalazine Other (See Comments)    Upset stomach    Allergies as of 01/21/2024       Reactions  Amoxicillin Swelling   Flonase [fluticasone Propionate] Swelling   Swelling of throat per patient   Sulfa Antibiotics Other (See Comments)   Upset stomach   Sulfasalazine Other (See Comments)   Upset stomach        Medication List        Accurate as of January 21, 2024 11:08 AM. If you have any questions, ask your nurse or doctor.          ACID REDUCER PO Take 1 tablet by mouth at bedtime as needed.   aspirin  EC 81 MG tablet Take 1 tablet (81 mg total) by mouth  daily. Swallow whole.   atorvastatin 40 MG tablet Commonly known as: LIPITOR Take 1 tablet (40 mg total) by mouth daily.   BENEFIBER PO Take 0.5 Scoops by mouth as needed.   diltiazem  180 MG 24 hr capsule Commonly known as: Cartia  XT Take 1 capsule (180 mg total) by mouth at bedtime.   Eliquis  2.5 MG Tabs tablet Generic drug: apixaban  Take 2.5 mg by mouth 2 (two) times daily.   GAS-X PO Take 1 tablet by mouth as needed.   lactulose 10 GM/15ML solution Commonly known as: CHRONULAC Take 30 mLs by mouth daily.   lactulose 10 GM/15ML solution Commonly known as: CHRONULAC Take 30 mLs (20 g total) by mouth daily.   levothyroxine  125 MCG tablet Commonly known as: SYNTHROID  TAKE 1 TABLET BY MOUTH DAILY BEFORE BREAKFAST.   LORazepam  0.5 MG tablet Commonly known as: ATIVAN  Take 1 tablet by mouth as needed for anxiety.   LORazepam  0.5 MG tablet Commonly known as: ATIVAN  Take 1 tablet (0.5 mg total) by mouth 2 (two) times daily as needed for anxiety.   MULTIVITAMINS PO Take 2 each by mouth every evening. Gummies   predniSONE 20 MG tablet Commonly known as: DELTASONE Take 40 mg by mouth daily with breakfast.   PRESERVISION AREDS PO Take 1 tablet by mouth at bedtime.   MULTIVITAMIN GUMMIES ADULT PO Take 2 Pieces by mouth every evening.   Probiotic Chew Chew 1 each by mouth every evening.   RA CRANBERRY SUPPLEMENTS PO Take 2 tablets by mouth every evening.   Vitamin D -3 25 MCG (1000 UT) Caps Take 1,000 Units by mouth in the morning.        Review of Systems  Constitutional:  Negative for fatigue, fever and unexpected weight change.  HENT:  Negative for congestion, trouble swallowing and voice change.   Eyes:  Negative for visual disturbance.  Respiratory:  Negative for cough and shortness of breath.   Cardiovascular:  Positive for leg swelling. Negative for chest pain and palpitations.  Gastrointestinal:  Positive for constipation. Negative for abdominal pain  and vomiting.       Occasional, on diet.   Genitourinary:  Negative for difficulty urinating, dysuria and urgency.  Musculoskeletal:  Negative for back pain and gait problem.  Skin:  Negative for color change.  Neurological:  Positive for weakness. Negative for speech difficulty, light-headedness and headaches.  Psychiatric/Behavioral:  Positive for sleep disturbance. Negative for behavioral problems. The patient is nervous/anxious.     Immunization History  Administered Date(s) Administered  . Influenza, High Dose Seasonal PF 06/05/2015, 06/13/2016, 06/15/2017, 05/31/2018, 05/16/2019, 05/16/2020  . Influenza,inj,Quad PF,6+ Mos 05/19/2014  . Influenza,inj,quad, With Preservative 05/31/2018  . Moderna SARS-COV2 Booster Vaccination 06/12/2020  . Moderna Sars-Covid-2 Vaccination 08/08/2019, 09/05/2019  . Tdap 08/04/2010  . Zoster, Live 05/13/2011   Pertinent  Health Maintenance Due  Topic Date Due  .  INFLUENZA VACCINE  03/04/2024  . DEXA SCAN  Discontinued      03/08/2020    9:13 AM 09/30/2020    3:25 PM 12/13/2020    3:25 PM 07/07/2022   11:36 AM 07/08/2022   10:43 AM  Fall Risk  Falls in the past year? 1  0    Was there an injury with Fall? 0  0    Fall Risk Category Calculator 1  0    Fall Risk Category (Retired) Low   Low     (RETIRED) Patient Fall Risk Level  Low fall risk  Low fall risk  Low fall risk  High fall risk      Data saved with a previous flowsheet row definition   Functional Status Survey:    Vitals:   01/21/24 1100  BP: 130/80  Pulse: 64  Resp: 16  Temp: 97.7 F (36.5 C)  SpO2: 94%  Weight: 115 lb 8 oz (52.4 kg)  Height: 5' 5 (1.651 m)   Body mass index is 19.22 kg/m. Physical Exam Vitals and nursing note reviewed.  Constitutional:      Appearance: Normal appearance.  HENT:     Head: Normocephalic and atraumatic.     Mouth/Throat:     Mouth: Mucous membranes are moist.   Eyes:     Extraocular Movements: Extraocular movements intact.      Conjunctiva/sclera: Conjunctivae normal.     Pupils: Pupils are equal, round, and reactive to light.    Cardiovascular:     Rate and Rhythm: Normal rate and regular rhythm.     Heart sounds: Murmur heard.  Pulmonary:     Effort: Pulmonary effort is normal.     Breath sounds: Normal breath sounds. No rales.  Abdominal:     General: Bowel sounds are normal.     Palpations: Abdomen is soft.     Tenderness: There is no abdominal tenderness.  Genitourinary:    Comments: Declined to undress herself for examination.   Musculoskeletal:     Cervical back: Normal range of motion and neck supple.     Right lower leg: Edema present.     Left lower leg: Edema present.     Comments: Trace BLE, TED   Skin:    General: Skin is warm and dry.   Neurological:     General: No focal deficit present.     Mental Status: She is alert and oriented to person, place, and time. Mental status is at baseline.     Motor: Weakness present.     Gait: Gait abnormal.     Comments: Left sided weakness.   Psychiatric:        Mood and Affect: Mood normal.        Behavior: Behavior normal.        Thought Content: Thought content normal.        Judgment: Judgment normal.     Comments: Anxious, racing speech    Labs reviewed: Recent Labs    01/02/24 2248 01/03/24 0214 01/14/24 1224 01/14/24 1226 01/16/24 1819 01/17/24 1129 01/18/24 0443  NA  --    < > 129*   < > 127* 127* 128*  K  --    < > 3.9   < > 3.3* 4.1 3.8  CL  --    < > 96*   < > 91* 93* 95*  CO2  --    < > 23   < > 25 25 25   GLUCOSE  --    < >  113*   < > 92 109* 88  BUN  --    < > 19   < > 16 11 17   CREATININE  --    < > 0.89   < > 0.70 0.56 0.63  CALCIUM  --    < > 9.2   < > 8.7* 8.8* 8.5*  MG 1.9  --  1.9  --  1.8 2.0  --   PHOS 3.1  --   --   --   --   --   --    < > = values in this interval not displayed.   Recent Labs    01/02/24 1208 01/14/24 1224 01/15/24 0543  AST 24 26 34  ALT 20 20 22   ALKPHOS 64 56 68  BILITOT 0.9  0.9 1.1  PROT 6.2* 6.5 6.2*  ALBUMIN 3.8 3.9 3.6   Recent Labs    01/14/24 1224 01/14/24 1226 01/15/24 0543 01/16/24 1111  WBC 5.4  --  5.9 6.7  NEUTROABS 3.9  --   --   --   HGB 13.6 13.6 13.8 14.4  HCT 38.8 40.0 39.1 40.5  MCV 93.9  --  93.5 92.0  PLT 153  --  165 154   Lab Results  Component Value Date   TSH 5.952 (H) 01/14/2024   Lab Results  Component Value Date   HGBA1C 5.0 01/15/2024   Lab Results  Component Value Date   CHOL 237 (H) 01/15/2024   HDL 101 01/15/2024   LDLCALC 128 (H) 01/15/2024   LDLDIRECT 148.1 08/17/2013   TRIG 42 01/15/2024   CHOLHDL 2.3 01/15/2024    Significant Diagnostic Results in last 30 days:  MR BRAIN WO CONTRAST Result Date: 01/14/2024 CLINICAL DATA:  Stroke follow-up. EXAM: MRI HEAD WITHOUT CONTRAST TECHNIQUE: Multiplanar, multiecho pulse sequences of the brain and surrounding structures were obtained without intravenous contrast. COMPARISON:  CT head and CTA head and neck earlier same day. FINDINGS: Brain: 10 mm focus of restricted diffusion in the right thalamus compatible with acute infarct. Chronic microhemorrhage in the posterior left frontal lobe. Small focus of susceptibility in the right thalamus corresponding to the region of infarct which may reflect petechial hemorrhage. T2/FLAIR hyperintensity in the periventricular and subcortical white matter. Mild parenchymal volume loss. No edema, mass effect, or midline shift. Posterior fossa is unremarkable. Normal appearance of midline structures. The basilar cisterns are patent. No extra-axial fluid collections. Ventricles: Normal size and configuration of the ventricles. Vascular: Skull base flow voids are visualized. Skull and upper cervical spine: No focal abnormality. Sinuses/Orbits: Bilateral lens replacement. Paranasal sinuses are clear. Other: Mastoid air cells are clear. IMPRESSION: Acute infarct in the right thalamus. Small focus of susceptibility within this region which may reflect  developing petechial hemorrhage. Recommend short interval follow-up head CT. No significant edema or midline shift. Mild chronic microvascular ischemic changes and mild parenchymal volume loss. Chronic microhemorrhage in the posterior left frontal lobe. Electronically Signed   By: Denny Flack M.D.   On: 01/14/2024 20:21   ECHOCARDIOGRAM COMPLETE Result Date: 01/14/2024    ECHOCARDIOGRAM REPORT   Patient Name:   Stacy [name] Date of Exam: 01/14/2024 Medical Rec #:  [id number]       Height:       65.0 in Accession #:    [id number]      Weight:       125.7 lb Date of Birth:  11-Sep-1940        BSA:  1.624 m Patient Age:    83 years        BP:           169/84 mmHg Patient Gender: F               HR:           74 bpm. Exam Location:  Inpatient Procedure: 2D Echo, Cardiac Doppler and Color Doppler (Both Spectral and Color            Flow Doppler were utilized during procedure). Indications:    Stroke  History:        Patient has prior history of Echocardiogram examinations, most                 recent 05/11/2013. Aortic Valve Disease, Arrythmias:Atrial                 Fibrillation, Signs/Symptoms:Murmur; Risk Factors:Hypertension.  Sonographer:    Astrid Blamer Referring Phys: Alcide Humble PATEL IMPRESSIONS  1. Left ventricular ejection fraction, by estimation, is 65 to 70%. The left ventricle has normal function. The left ventricle has no regional wall motion abnormalities. There is mild left ventricular hypertrophy. Left ventricular diastolic function could not be evaluated.  2. Right ventricular systolic function is normal. The right ventricular size is normal.  3. Left atrial size was severely dilated.  4. The mitral valve is abnormal. Trivial mitral valve regurgitation. Mild mitral stenosis. Severe mitral annular calcification.  5. The aortic valve is calcified. There is moderate calcification of the aortic valve. There is moderate thickening of the aortic valve. Aortic valve regurgitation is mild to moderate.  Mild aortic valve stenosis. Comparison(s): Prior images unable to be directly viewed, comparison made by report only. Conclusion(s)/Recommendation(s): EF unchanged, mild mitral stenosis, mild aortic stenosis. FINDINGS  Left Ventricle: Left ventricular ejection fraction, by estimation, is 65 to 70%. The left ventricle has normal function. The left ventricle has no regional wall motion abnormalities. The left ventricular internal cavity size was normal in size. There is  mild left ventricular hypertrophy. Left ventricular diastolic function could not be evaluated due to mitral annular calcification (moderate or greater). Left ventricular diastolic function could not be evaluated. Right Ventricle: The right ventricular size is normal. No increase in right ventricular wall thickness. Right ventricular systolic function is normal. Left Atrium: Left atrial size was severely dilated. Right Atrium: Right atrial size was normal in size. Pericardium: There is no evidence of pericardial effusion. Mitral Valve: The mitral valve is abnormal. Severe mitral annular calcification. Trivial mitral valve regurgitation. Mild mitral valve stenosis. MV peak gradient, 16.8 mmHg. The mean mitral valve gradient is 6.0 mmHg. Tricuspid Valve: The tricuspid valve is normal in structure. Tricuspid valve regurgitation is trivial. No evidence of tricuspid stenosis. Aortic Valve: The aortic valve is calcified. There is moderate calcification of the aortic valve. There is moderate thickening of the aortic valve. Aortic valve regurgitation is mild to moderate. Mild aortic stenosis is present. Aortic valve mean gradient measures 17.0 mmHg. Aortic valve peak gradient measures 36.5 mmHg. Aortic valve area, by VTI measures 1.43 cm. Pulmonic Valve: The pulmonic valve was not well visualized. Pulmonic valve regurgitation is not visualized. No evidence of pulmonic stenosis. Aorta: The aortic root is normal in size and structure. Venous: The inferior vena  cava was not well visualized. IAS/Shunts: The atrial septum is grossly normal.  LEFT VENTRICLE PLAX 2D LVIDd:         3.10 cm   Diastology LVIDs:  1.50 cm   LV e' medial:    5.87 cm/s LV PW:         1.10 cm   LV E/e' medial:  17.9 LV IVS:        1.27 cm   LV e' lateral:   7.72 cm/s LVOT diam:     1.80 cm   LV E/e' lateral: 13.6 LV SV:         96 LV SV Index:   59 LVOT Area:     2.54 cm  RIGHT VENTRICLE RV S prime:     13.30 cm/s TAPSE (M-mode): 2.1 cm LEFT ATRIUM             Index        RIGHT ATRIUM          Index LA Vol (A2C):   64.3 ml 39.60 ml/m  RA Area:     7.20 cm LA Vol (A4C):   85.1 ml 52.41 ml/m  RA Volume:   10.40 ml 6.41 ml/m LA Biplane Vol: 77.8 ml 47.92 ml/m  AORTIC VALVE AV Area (Vmax):    1.39 cm AV Area (Vmean):   1.58 cm AV Area (VTI):     1.43 cm AV Vmax:           302.00 cm/s AV Vmean:          187.000 cm/s AV VTI:            0.669 m AV Peak Grad:      36.5 mmHg AV Mean Grad:      17.0 mmHg LVOT Vmax:         165.00 cm/s LVOT Vmean:        116.000 cm/s LVOT VTI:          0.376 m LVOT/AV VTI ratio: 0.56  AORTA Ao Root diam: 3.00 cm MITRAL VALVE MV Area (PHT): 3.27 cm     SHUNTS MV Area VTI:   2.28 cm     Systemic VTI:  0.38 m MV Peak grad:  16.8 mmHg    Systemic Diam: 1.80 cm MV Mean grad:  6.0 mmHg MV Vmax:       2.05 m/s MV Vmean:      115.0 cm/s MV Decel Time: 232 msec MV E velocity: 105.00 cm/s MV A velocity: 179.00 cm/s MV E/A ratio:  0.59 Sheryle Donning MD Electronically signed by Sheryle Donning MD Signature Date/Time: 01/14/2024/7:40:23 PM    Final    CT ANGIO HEAD NECK W WO CM (CODE STROKE) Result Date: 01/14/2024 CLINICAL DATA:  Neuro deficit, acute, stroke suspected. Left-sided weakness. EXAM: CT ANGIOGRAPHY HEAD AND NECK WITH AND WITHOUT CONTRAST TECHNIQUE: Multidetector CT imaging of the head and neck was performed using the standard protocol during bolus administration of intravenous contrast. Multiplanar CT image reconstructions and MIPs were  obtained to evaluate the vascular anatomy. Carotid stenosis measurements (when applicable) are obtained utilizing NASCET criteria, using the distal internal carotid diameter as the denominator. RADIATION DOSE REDUCTION: This exam was performed according to the departmental dose-optimization program which includes automated exposure control, adjustment of the mA and/or kV according to patient size and/or use of iterative reconstruction technique. CONTRAST:  75mL OMNIPAQUE  IOHEXOL  350 MG/ML SOLN COMPARISON:  None Available. FINDINGS: CTA NECK FINDINGS Aortic arch: Incomplete imaging of the aortic arch, including the origins of the brachiocephalic and left common carotid arteries. The brachiocephalic and subclavian arteries are patent with calcified plaque at the origin of the right subclavian artery resulting in  no more than mild stenosis. Right carotid system: Patent with calcified plaque at the carotid bifurcation resulting in severe stenosis of the origin of the external carotid artery. No evidence of a significant stenosis or dissection of the common carotid or cervical internal carotid arteries. Left carotid system: Patent with calcified plaque at the carotid bifurcation. No evidence of a significant stenosis or dissection. Vertebral arteries: Patent without evidence of a significant stenosis or dissection. Moderately dominant left vertebral artery. Skeleton: Widespread advanced cervical disc degeneration. Potentially moderate spinal stenosis at C4-5. Advanced multilevel neural foraminal stenosis. Other neck: No evidence of cervical lymphadenopathy or mass. Upper chest: Clear lung apices. Review of the MIP images confirms the above findings CTA HEAD FINDINGS Anterior circulation: The internal carotid arteries are patent from skull base to carotid termini with mild atherosclerotic irregularity bilaterally but no significant stenosis. ACAs and MCAs are patent without evidence of a proximal branch occlusion or  significant A1 or left M1 stenosis. There is a severe proximal right M1 stenosis, and mild bilateral MCA and ACA branch vessel irregularity is noted. No aneurysm is identified. Posterior circulation: The intracranial vertebral arteries are widely patent to the basilar. Patent AICA and SCA origins are visualized bilaterally. The basilar artery is widely patent. Posterior communicating arteries are diminutive or absent. Both PCAs are patent. There are moderate proximal left P2 and severe distal right P2 stenoses. No aneurysm is identified. Venous sinuses: As permitted by contrast timing, patent. Anatomic variants: None. Review of the MIP images confirms the above findings These results were communicated to Dr. Christiane Cowing at 12:45 pm on 01/14/2024 by text page via the [facility] messaging system. IMPRESSION: 1. No large vessel occlusion. 2. Intracranial atherosclerosis including severe right M1 and right P2 stenoses. 3. Cervical carotid atherosclerosis without a significant stenosis of the common carotid or internal carotid arteries. 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 Electronically Signed   By: Aundra Lee M.D.   On: 01/14/2024 13:02   CT HEAD CODE STROKE WO CONTRAST Result Date: 01/14/2024 CLINICAL DATA:  Code stroke. Neuro deficit, acute, stroke suspected. Left-sided weakness. EXAM: CT HEAD WITHOUT CONTRAST TECHNIQUE: Contiguous axial images were obtained from the base of the skull through the vertex without intravenous contrast. RADIATION DOSE REDUCTION: This exam was performed according to the departmental dose-optimization program which includes automated exposure control, adjustment of the mA and/or kV according to patient size and/or use of iterative reconstruction technique. COMPARISON:  None Available. FINDINGS: Brain: There is no evidence of an acute infarct, intracranial hemorrhage, mass, midline shift, or extra-axial fluid collection. Cerebral volume is within normal limits for age. Patchy hypodensities in the  cerebral white matter bilaterally are nonspecific but compatible with mild chronic small vessel ischemic disease. Vascular: Calcified atherosclerosis at the skull base. No hyperdense vessel. Skull: No fracture or suspicious lesion. Hyperostosis frontalis interna. Sinuses/Orbits: Visualized paranasal sinuses and mastoid air cells are clear. Bilateral cataract extraction. Other: None. ASPECTS [facility] Stroke Program Early CT Score) - Ganglionic level infarction (caudate, lentiform nuclei, internal capsule, insula, M1-M3 cortex): 7 - Supraganglionic infarction (M4-M6 cortex): 3 Total score (0-10 with 10 being normal): 10 IMPRESSION: 1. No evidence of acute intracranial abnormality. ASPECTS of 10. 2. Mild chronic small vessel ischemic disease. These results were communicated to Dr. Christiane Cowing at 12:45 pm on 01/14/2024 by text page via the [facility] messaging system. Electronically Signed   By: Aundra Lee M.D.   On: 01/14/2024 12:46   CT ABDOMEN PELVIS W CONTRAST Result Date: 01/02/2024 CLINICAL DATA:  Abdominal pain. Palpitations. Suspected bowel obstruction. EXAM: CT ABDOMEN AND PELVIS WITH CONTRAST TECHNIQUE: Multidetector CT imaging of the abdomen and pelvis was performed using the standard protocol following bolus administration of intravenous contrast. RADIATION DOSE REDUCTION: This exam was performed according to the departmental dose-optimization program which includes automated exposure control, adjustment of the mA and/or kV according to patient size and/or use of iterative reconstruction technique. CONTRAST:  75mL OMNIPAQUE  IOHEXOL  350 MG/ML SOLN COMPARISON:  None Available. FINDINGS: Lower Chest: Mild compressive bibasilar atelectasis and tiny left pleural effusion. Hepatobiliary: No suspicious hepatic masses identified. A few small hepatic cysts are noted. Gallbladder is unremarkable. No evidence of biliary ductal dilatation. Pancreas:  No mass or inflammatory changes. Spleen: Within normal limits in size and  appearance. Adrenals/Urinary Tract: No suspicious masses identified. Small benign-appearing bilateral subcapsular renal cysts incidentally noted (No followup imaging is recommended). No evidence of ureteral calculi or hydronephrosis. Unremarkable unopacified urinary bladder. Stomach/Bowel: A large hiatal hernia is seen which contains nearly the entire stomach, transverse colon, and pancreatic tail. No evidence of bowel obstruction, inflammatory process or abnormal fluid collections. Vascular/Lymphatic: No pathologically enlarged lymph nodes. No acute vascular findings. Reproductive:  No mass or other significant abnormality. Other:  None. Musculoskeletal: No suspicious bone lesions identified. Severe thoracolumbar rotatory levoscoliosis noted. IMPRESSION: No evidence of bowel obstruction or other acute findings. Large hiatal hernia, which contains nearly the entire stomach, transverse colon, and pancreatic tail. Severe thoracolumbar levoscoliosis. Mild bibasilar atelectasis and tiny left pleural effusion. Electronically Signed   By: Marlyce Sine M.D.   On: 01/02/2024 14:30   DG Chest Portable 1 View Result Date: 01/02/2024 CLINICAL DATA:  palpitations EXAM: PORTABLE CHEST 1 VIEW COMPARISON:  April 04, 2023, April 07, 2015 FINDINGS: Evaluation is limited by rotation. Revisualization of a large diaphragmatic hernia. Cardiomediastinal silhouette is grossly unchanged. No pleural effusion or pneumothorax. No acute pleuroparenchymal abnormality. Atherosclerotic calcifications. Severe levo scoliosis of the thoracic spine. IMPRESSION: No acute cardiopulmonary abnormality. Electronically Signed   By: Clancy Crimes M.D.   On: 01/02/2024 13:18    Assessment/Plan There are no diagnoses linked to this encounter.   Family/ staff Communication:   Labs/tests ordered:

## 2024-01-21 NOTE — Assessment & Plan Note (Signed)
 HLD/carotid artery stenosis, on Atorvastatin, LDL 128 01/15/24

## 2024-01-21 NOTE — Assessment & Plan Note (Signed)
 Heart rate is in control, on Eliquis, Diltiazem

## 2024-01-21 NOTE — Assessment & Plan Note (Signed)
 CVA, right thalamus MRI brain,  with left sided weakness and dysarthria  hospitalized 01/14/24-01/18/24, f/u stroke clinic/neurology. On ASA, Atrovastatin, Eliquis (initially held ASA/Eliquis  due to micro hemorrhage within the lesion and chronic in the posterior left frontal lobe)

## 2024-01-21 NOTE — Assessment & Plan Note (Signed)
 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 Na 128 01/18/24

## 2024-01-21 NOTE — Assessment & Plan Note (Signed)
 TSH 5.9, on Levothyroxine .

## 2024-01-22 ENCOUNTER — Encounter: Payer: Self-pay | Admitting: Nurse Practitioner

## 2024-01-22 ENCOUNTER — Non-Acute Institutional Stay (SKILLED_NURSING_FACILITY): Payer: Self-pay | Admitting: Nurse Practitioner

## 2024-01-22 DIAGNOSIS — Z8673 Personal history of transient ischemic attack (TIA), and cerebral infarction without residual deficits: Secondary | ICD-10-CM | POA: Insufficient documentation

## 2024-01-22 DIAGNOSIS — F419 Anxiety disorder, unspecified: Secondary | ICD-10-CM | POA: Diagnosis not present

## 2024-01-22 DIAGNOSIS — E785 Hyperlipidemia, unspecified: Secondary | ICD-10-CM | POA: Diagnosis not present

## 2024-01-22 DIAGNOSIS — E039 Hypothyroidism, unspecified: Secondary | ICD-10-CM | POA: Diagnosis not present

## 2024-01-22 DIAGNOSIS — I10 Essential (primary) hypertension: Secondary | ICD-10-CM | POA: Diagnosis not present

## 2024-01-22 DIAGNOSIS — E871 Hypo-osmolality and hyponatremia: Secondary | ICD-10-CM

## 2024-01-22 DIAGNOSIS — R350 Frequency of micturition: Secondary | ICD-10-CM | POA: Insufficient documentation

## 2024-01-22 DIAGNOSIS — I951 Orthostatic hypotension: Secondary | ICD-10-CM | POA: Insufficient documentation

## 2024-01-22 DIAGNOSIS — I480 Paroxysmal atrial fibrillation: Secondary | ICD-10-CM | POA: Diagnosis not present

## 2024-01-22 DIAGNOSIS — R42 Dizziness and giddiness: Secondary | ICD-10-CM | POA: Diagnosis not present

## 2024-01-22 DIAGNOSIS — K5901 Slow transit constipation: Secondary | ICD-10-CM | POA: Diagnosis not present

## 2024-01-22 NOTE — Assessment & Plan Note (Signed)
 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 Na 128 01/18/24

## 2024-01-22 NOTE — Assessment & Plan Note (Signed)
 on Diltiazem . Bun/creat 17/0.63 01/18/24

## 2024-01-22 NOTE — Progress Notes (Signed)
 Location:   SNF [facility] Room Number: 22 Place of Service:  SNF (31) Provider: [facility] Brynlea Spindler NP  Chares Commons, PA-C  Patient Care Team: Murry Art as PCP - General (Physician Assistant) [name], MD as PCP - Electrophysiology (Cardiology) [name] [name], MD as PCP - Cardiology (Cardiology) [name], MD as Consulting Physician (Cardiology)  Extended Emergency Contact Information Primary Emergency Contact: [name], [place] [address] [place] Home Phone: [phone] Mobile Phone: [phone] Relation: Niece  Code Status: DNR Goals of care: Advanced Directive information    01/21/2024   11:08 AM  Advanced Directives  Does Patient Have a Medical Advance Directive? Yes  Type of Estate agent of [place];Living will  Does patient want to make changes to medical advance directive? No - Patient declined  Copy of Healthcare Power of Attorney in Chart? Yes - validated most recent copy scanned in chart (See row information)     Chief Complaint  Patient presents with   Acute Visit    Feeling dizzy since OOB this morning.  C/o frequent urination last night    HPI:  Pt is a 83 y.o. female seen today for an acute visit for c/o frequent urination last night, 3x,unusual for her, stated it was the symptoms with the past UTIs, denied dysuria, urinary urgency, incontinence, lower abd/back discomfort. Also c/o dizziness since OOB this morning, lasted longer than the episodes in the past, denied headache, change of vision/speech, SOB, palpitation, nausea, vomiting, or new focal weakness   CVA, right thalamus MRI brain,  with left sided weakness with muscle strength 5/5 and resolved dysarthria  hospitalized 01/14/24-01/18/24, f/u stroke clinic/neurology. 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 Na 128 01/18/24             GAD/insomnia, on Lorazepam              HTN, on Diltiazem . Bun/creat 17/0.63 01/18/24             Afib, on Eliquis , Diltiazem              Chronic cystitis/urinary frequency.              Hypothyroidism, TSH 5.9, on Levothyroxine . Scheduled TSH 6 weeks.              HLD/carotid artery stenosis, on Atorvastatin, LDL 128 01/15/24             IBS/constipation, Bene fiber, Lactulose.                       Past Medical History:  Diagnosis Date   Atrial fibrillation (HCC)    echo 12/23/10 EF= >55%, stress myoview 01/30/11 normal pattern of perfusion in all myocardial regions   Carotid artery stenosis    Per [facility] new patient packet    Chicken pox    Colon polyp    Colon polyp    Dyslipidemia    Heart murmur    Hiatal hernia    Hypertension    Hypothyroidism    Laryngopharyngeal reflux    Osteopenia    Scoliosis    Seasonal allergies    Thyroid  disease    Vitamin D  deficiency  Past Surgical History:  Procedure Laterality Date   BREAST CYST EXCISION Right 1988   ESOPHAGEAL MANOMETRY N/A 08/29/2015   Procedure: ESOPHAGEAL MANOMETRY (EM);  Surgeon: Danette Duos, MD;  Location: [facility];  Service: Gastroenterology;  Laterality: N/A;   EYE SURGERY     Cataract eye surgery-right 06/2014, left 04/2014   ORIF PATELLA Right 07/08/2022   Procedure: OPEN REDUCTION INTERNAL FIXATION (ORIF) PATELLA;  Surgeon: Osa Blase, MD;  Location: [facility];  Service: Orthopedics;  Laterality: Right;   TONSILLECTOMY     Childhood    Allergies  Allergen Reactions   Amoxicillin Swelling   Flonase [Fluticasone Propionate] Swelling    Swelling of throat per patient    Sulfa Antibiotics Other (See Comments)    Upset stomach   Sulfasalazine Other (See Comments)    Upset stomach    Allergies as of 01/22/2024       Reactions    Amoxicillin Swelling   Flonase [fluticasone Propionate] Swelling   Swelling of throat per patient   Sulfa Antibiotics Other (See Comments)   Upset stomach   Sulfasalazine Other (See Comments)   Upset stomach        Medication List        Accurate as of January 22, 2024  3:45 PM. If you have any questions, ask your nurse or doctor.          ACID REDUCER PO Take 1 tablet by mouth at bedtime as needed.   aspirin  EC 81 MG tablet Take 1 tablet (81 mg total) by mouth daily. Swallow whole.   atorvastatin 40 MG tablet Commonly known as: LIPITOR Take 1 tablet (40 mg total) by mouth daily.   BENEFIBER PO Take 0.5 Scoops by mouth as needed.   diltiazem  180 MG 24 hr capsule Commonly known as: Cartia  XT Take 1 capsule (180 mg total) by mouth at bedtime.   Eliquis  2.5 MG Tabs tablet Generic drug: apixaban  Take 2.5 mg by mouth 2 (two) times daily.   GAS-X PO Take 1 tablet by mouth as needed.   lactulose 10 GM/15ML solution Commonly known as: CHRONULAC Take 30 mLs by mouth daily.   lactulose 10 GM/15ML solution Commonly known as: CHRONULAC Take 30 mLs (20 g total) by mouth daily.   levothyroxine  125 MCG tablet Commonly known as: SYNTHROID  TAKE 1 TABLET BY MOUTH DAILY BEFORE BREAKFAST.   LORazepam  0.5 MG tablet Commonly known as: ATIVAN  Take 1 tablet by mouth as needed for anxiety.   LORazepam  0.5 MG tablet Commonly known as: ATIVAN  Take 1 tablet (0.5 mg total) by mouth 2 (two) times daily as needed for anxiety.   MULTIVITAMINS PO Take 2 each by mouth every evening. Gummies   predniSONE 20 MG tablet Commonly known as: DELTASONE Take 40 mg by mouth daily with breakfast.   PRESERVISION AREDS PO Take 1 tablet by mouth at bedtime.   MULTIVITAMIN GUMMIES ADULT PO Take 2 Pieces by mouth every evening.   Probiotic Chew Chew 1 each by mouth every evening.   RA CRANBERRY SUPPLEMENTS PO Take 2 tablets by mouth every evening.   Vitamin D -3 25 MCG (1000 UT)  Caps Take 1,000 Units by mouth in the morning.        Review of Systems  Constitutional:  Negative for appetite change, fatigue and fever.  HENT:  Negative for congestion and trouble swallowing.   Eyes:  Negative for visual disturbance.  Respiratory:  Negative for cough and shortness of breath.   Cardiovascular:  Positive for leg swelling. Negative for chest pain and palpitations.  Gastrointestinal:  Positive for constipation. Negative for abdominal pain, nausea and vomiting.       Occasional, on diet.   Genitourinary:  Positive for frequency. Negative for difficulty urinating, dysuria and urgency.  Musculoskeletal:  Negative for back pain and gait problem.  Skin:  Negative for color change.  Neurological:  Positive for dizziness and weakness. Negative for speech difficulty and headaches.  Psychiatric/Behavioral:  Positive for sleep disturbance. Negative for behavioral problems. The patient is nervous/anxious.     Immunization History  Administered Date(s) Administered   Influenza, High Dose Seasonal PF 06/05/2015, 06/13/2016, 06/15/2017, 05/31/2018, 05/16/2019, 05/16/2020   Influenza,inj,Quad PF,6+ Mos 05/19/2014   Influenza,inj,quad, With Preservative 05/31/2018   Moderna SARS-COV2 Booster Vaccination 06/12/2020   Moderna Sars-Covid-2 Vaccination 08/08/2019, 09/05/2019   Tdap 08/04/2010   Zoster, Live 05/13/2011   Pertinent  Health Maintenance Due  Topic Date Due   INFLUENZA VACCINE  03/04/2024   DEXA SCAN  Discontinued      03/08/2020    9:13 AM 09/30/2020    3:25 PM 12/13/2020    3:25 PM 07/07/2022   11:36 AM 07/08/2022   10:43 AM  Fall Risk  Falls in the past year? 1  0    Was there an injury with Fall? 0  0    Fall Risk Category Calculator 1  0    Fall Risk Category (Retired) Low   Low     (RETIRED) Patient Fall Risk Level  Low fall risk  Low fall risk  Low fall risk  High fall risk      Data saved with a previous flowsheet row definition   Functional Status  Survey:    Vitals:   01/22/24 1036  BP: 130/80  Pulse: 64  Resp: 16  Temp: 97.7 F (36.5 C)  SpO2: 94%  Weight: 115 lb 8 oz (52.4 kg)   Body mass index is 19.22 kg/m. Physical Exam Vitals and nursing note reviewed.  Constitutional:      Appearance: Normal appearance.  HENT:     Head: Normocephalic and atraumatic.     Nose: Nose normal.     Mouth/Throat:     Mouth: Mucous membranes are moist.   Eyes:     Extraocular Movements: Extraocular movements intact.     Conjunctiva/sclera: Conjunctivae normal.     Pupils: Pupils are equal, round, and reactive to light.    Cardiovascular:     Rate and Rhythm: Normal rate. Rhythm irregular.     Heart sounds: Murmur heard.  Pulmonary:     Effort: Pulmonary effort is normal.     Breath sounds: Normal breath sounds. No stridor. No rales.  Abdominal:     General: Bowel sounds are normal.     Palpations: Abdomen is soft.     Tenderness: There is no abdominal tenderness. There is no right CVA tenderness, left CVA tenderness, guarding or rebound.  Genitourinary:    Comments: Declined to undress herself for examination.   Musculoskeletal:     Cervical back: Normal range of motion and neck supple.     Right lower leg: Edema present.     Left lower leg: Edema present.     Comments: Trace BLE, TED   Skin:    General: Skin is warm and dry.   Neurological:     General: No focal deficit present.     Mental Status: She is alert and oriented to person, place, and time. Mental  status is at baseline.     Motor: Weakness present.     Coordination: Coordination normal.     Gait: Gait abnormal.     Deep Tendon Reflexes: Reflexes normal.     Comments: Left sided weakness with muscle strength 5/5-no change  Psychiatric:        Mood and Affect: Mood normal.        Behavior: Behavior normal.        Thought Content: Thought content normal.        Judgment: Judgment normal.     Comments: Anxious, racing speech     Labs reviewed: Recent  Labs    01/02/24 2248 01/03/24 0214 01/14/24 1224 01/14/24 1226 01/16/24 1819 01/17/24 1129 01/18/24 0443  NA  --    < > 129*   < > 127* 127* 128*  K  --    < > 3.9   < > 3.3* 4.1 3.8  CL  --    < > 96*   < > 91* 93* 95*  CO2  --    < > 23   < > 25 25 25   GLUCOSE  --    < > 113*   < > 92 109* 88  BUN  --    < > 19   < > 16 11 17   CREATININE  --    < > 0.89   < > 0.70 0.56 0.63  CALCIUM  --    < > 9.2   < > 8.7* 8.8* 8.5*  MG 1.9  --  1.9  --  1.8 2.0  --   PHOS 3.1  --   --   --   --   --   --    < > = values in this interval not displayed.   Recent Labs    01/02/24 1208 01/14/24 1224 01/15/24 0543  AST 24 26 34  ALT 20 20 22   ALKPHOS 64 56 68  BILITOT 0.9 0.9 1.1  PROT 6.2* 6.5 6.2*  ALBUMIN 3.8 3.9 3.6   Recent Labs    01/14/24 1224 01/14/24 1226 01/15/24 0543 01/16/24 1111  WBC 5.4  --  5.9 6.7  NEUTROABS 3.9  --   --   --   HGB 13.6 13.6 13.8 14.4  HCT 38.8 40.0 39.1 40.5  MCV 93.9  --  93.5 92.0  PLT 153  --  165 154   Lab Results  Component Value Date   TSH 5.952 (H) 01/14/2024   Lab Results  Component Value Date   HGBA1C 5.0 01/15/2024   Lab Results  Component Value Date   CHOL 237 (H) 01/15/2024   HDL 101 01/15/2024   LDLCALC 128 (H) 01/15/2024   LDLDIRECT 148.1 08/17/2013   TRIG 42 01/15/2024   CHOLHDL 2.3 01/15/2024    Significant Diagnostic Results in last 30 days:  MR BRAIN WO CONTRAST Result Date: 01/14/2024 CLINICAL DATA:  Stroke follow-up. EXAM: MRI HEAD WITHOUT CONTRAST TECHNIQUE: Multiplanar, multiecho pulse sequences of the brain and surrounding structures were obtained without intravenous contrast. COMPARISON:  CT head and CTA head and neck earlier same day. FINDINGS: Brain: 10 mm focus of restricted diffusion in the right thalamus compatible with acute infarct. Chronic microhemorrhage in the posterior left frontal lobe. Small focus of susceptibility in the right thalamus corresponding to the region of infarct which may reflect  petechial hemorrhage. T2/FLAIR hyperintensity in the periventricular and subcortical white matter. Mild parenchymal volume loss. No edema, mass effect,  or midline shift. Posterior fossa is unremarkable. Normal appearance of midline structures. The basilar cisterns are patent. No extra-axial fluid collections. Ventricles: Normal size and configuration of the ventricles. Vascular: Skull base flow voids are visualized. Skull and upper cervical spine: No focal abnormality. Sinuses/Orbits: Bilateral lens replacement. Paranasal sinuses are clear. Other: Mastoid air cells are clear. IMPRESSION: Acute infarct in the right thalamus. Small focus of susceptibility within this region which may reflect developing petechial hemorrhage. Recommend short interval follow-up head CT. No significant edema or midline shift. Mild chronic microvascular ischemic changes and mild parenchymal volume loss. Chronic microhemorrhage in the posterior left frontal lobe. Electronically Signed   By: Denny Flack M.D.   On: 01/14/2024 20:21   ECHOCARDIOGRAM COMPLETE Result Date: 01/14/2024    ECHOCARDIOGRAM REPORT   Patient Name:   Stacy [name] Date of Exam: 01/14/2024 Medical Rec #:  [id number]       Height:       65.0 in Accession #:    [id number]      Weight:       125.7 lb Date of Birth:  06-27-1941        BSA:          1.624 m Patient Age:    83 years        BP:           169/84 mmHg Patient Gender: F               HR:           74 bpm. Exam Location:  Inpatient Procedure: 2D Echo, Cardiac Doppler and Color Doppler (Both Spectral and Color            Flow Doppler were utilized during procedure). Indications:    Stroke  History:        Patient has prior history of Echocardiogram examinations, most                 recent 05/11/2013. Aortic Valve Disease, Arrythmias:Atrial                 Fibrillation, Signs/Symptoms:Murmur; Risk Factors:Hypertension.  Sonographer:    Astrid Blamer Referring Phys: Alcide Humble PATEL IMPRESSIONS  1. Left ventricular  ejection fraction, by estimation, is 65 to 70%. The left ventricle has normal function. The left ventricle has no regional wall motion abnormalities. There is mild left ventricular hypertrophy. Left ventricular diastolic function could not be evaluated.  2. Right ventricular systolic function is normal. The right ventricular size is normal.  3. Left atrial size was severely dilated.  4. The mitral valve is abnormal. Trivial mitral valve regurgitation. Mild mitral stenosis. Severe mitral annular calcification.  5. The aortic valve is calcified. There is moderate calcification of the aortic valve. There is moderate thickening of the aortic valve. Aortic valve regurgitation is mild to moderate. Mild aortic valve stenosis. Comparison(s): Prior images unable to be directly viewed, comparison made by report only. Conclusion(s)/Recommendation(s): EF unchanged, mild mitral stenosis, mild aortic stenosis. FINDINGS  Left Ventricle: Left ventricular ejection fraction, by estimation, is 65 to 70%. The left ventricle has normal function. The left ventricle has no regional wall motion abnormalities. The left ventricular internal cavity size was normal in size. There is  mild left ventricular hypertrophy. Left ventricular diastolic function could not be evaluated due to mitral annular calcification (moderate or greater). Left ventricular diastolic function could not be evaluated. Right Ventricle: The right ventricular size is normal. No increase in right ventricular wall thickness.  Right ventricular systolic function is normal. Left Atrium: Left atrial size was severely dilated. Right Atrium: Right atrial size was normal in size. Pericardium: There is no evidence of pericardial effusion. Mitral Valve: The mitral valve is abnormal. Severe mitral annular calcification. Trivial mitral valve regurgitation. Mild mitral valve stenosis. MV peak gradient, 16.8 mmHg. The mean mitral valve gradient is 6.0 mmHg. Tricuspid Valve: The  tricuspid valve is normal in structure. Tricuspid valve regurgitation is trivial. No evidence of tricuspid stenosis. Aortic Valve: The aortic valve is calcified. There is moderate calcification of the aortic valve. There is moderate thickening of the aortic valve. Aortic valve regurgitation is mild to moderate. Mild aortic stenosis is present. Aortic valve mean gradient measures 17.0 mmHg. Aortic valve peak gradient measures 36.5 mmHg. Aortic valve area, by VTI measures 1.43 cm. Pulmonic Valve: The pulmonic valve was not well visualized. Pulmonic valve regurgitation is not visualized. No evidence of pulmonic stenosis. Aorta: The aortic root is normal in size and structure. Venous: The inferior vena cava was not well visualized. IAS/Shunts: The atrial septum is grossly normal.  LEFT VENTRICLE PLAX 2D LVIDd:         3.10 cm   Diastology LVIDs:         1.50 cm   LV e' medial:    5.87 cm/s LV PW:         1.10 cm   LV E/e' medial:  17.9 LV IVS:        1.27 cm   LV e' lateral:   7.72 cm/s LVOT diam:     1.80 cm   LV E/e' lateral: 13.6 LV SV:         96 LV SV Index:   59 LVOT Area:     2.54 cm  RIGHT VENTRICLE RV S prime:     13.30 cm/s TAPSE (M-mode): 2.1 cm LEFT ATRIUM             Index        RIGHT ATRIUM          Index LA Vol (A2C):   64.3 ml 39.60 ml/m  RA Area:     7.20 cm LA Vol (A4C):   85.1 ml 52.41 ml/m  RA Volume:   10.40 ml 6.41 ml/m LA Biplane Vol: 77.8 ml 47.92 ml/m  AORTIC VALVE AV Area (Vmax):    1.39 cm AV Area (Vmean):   1.58 cm AV Area (VTI):     1.43 cm AV Vmax:           302.00 cm/s AV Vmean:          187.000 cm/s AV VTI:            0.669 m AV Peak Grad:      36.5 mmHg AV Mean Grad:      17.0 mmHg LVOT Vmax:         165.00 cm/s LVOT Vmean:        116.000 cm/s LVOT VTI:          0.376 m LVOT/AV VTI ratio: 0.56  AORTA Ao Root diam: 3.00 cm MITRAL VALVE MV Area (PHT): 3.27 cm     SHUNTS MV Area VTI:   2.28 cm     Systemic VTI:  0.38 m MV Peak grad:  16.8 mmHg    Systemic Diam: 1.80 cm MV Mean  grad:  6.0 mmHg MV Vmax:       2.05 m/s MV Vmean:      115.0 cm/s  MV Decel Time: 232 msec MV E velocity: 105.00 cm/s MV A velocity: 179.00 cm/s MV E/A ratio:  0.59 Sheryle Donning MD Electronically signed by Sheryle Donning MD Signature Date/Time: 01/14/2024/7:40:23 PM    Final    CT ANGIO HEAD NECK W WO CM (CODE STROKE) Result Date: 01/14/2024 CLINICAL DATA:  Neuro deficit, acute, stroke suspected. Left-sided weakness. EXAM: CT ANGIOGRAPHY HEAD AND NECK WITH AND WITHOUT CONTRAST TECHNIQUE: Multidetector CT imaging of the head and neck was performed using the standard protocol during bolus administration of intravenous contrast. Multiplanar CT image reconstructions and MIPs were obtained to evaluate the vascular anatomy. Carotid stenosis measurements (when applicable) are obtained utilizing NASCET criteria, using the distal internal carotid diameter as the denominator. RADIATION DOSE REDUCTION: This exam was performed according to the departmental dose-optimization program which includes automated exposure control, adjustment of the mA and/or kV according to patient size and/or use of iterative reconstruction technique. CONTRAST:  75mL OMNIPAQUE  IOHEXOL  350 MG/ML SOLN COMPARISON:  None Available. FINDINGS: CTA NECK FINDINGS Aortic arch: Incomplete imaging of the aortic arch, including the origins of the brachiocephalic and left common carotid arteries. The brachiocephalic and subclavian arteries are patent with calcified plaque at the origin of the right subclavian artery resulting in no more than mild stenosis. Right carotid system: Patent with calcified plaque at the carotid bifurcation resulting in severe stenosis of the origin of the external carotid artery. No evidence of a significant stenosis or dissection of the common carotid or cervical internal carotid arteries. Left carotid system: Patent with calcified plaque at the carotid bifurcation. No evidence of a significant stenosis or dissection.  Vertebral arteries: Patent without evidence of a significant stenosis or dissection. Moderately dominant left vertebral artery. Skeleton: Widespread advanced cervical disc degeneration. Potentially moderate spinal stenosis at C4-5. Advanced multilevel neural foraminal stenosis. Other neck: No evidence of cervical lymphadenopathy or mass. Upper chest: Clear lung apices. Review of the MIP images confirms the above findings CTA HEAD FINDINGS Anterior circulation: The internal carotid arteries are patent from skull base to carotid termini with mild atherosclerotic irregularity bilaterally but no significant stenosis. ACAs and MCAs are patent without evidence of a proximal branch occlusion or significant A1 or left M1 stenosis. There is a severe proximal right M1 stenosis, and mild bilateral MCA and ACA branch vessel irregularity is noted. No aneurysm is identified. Posterior circulation: The intracranial vertebral arteries are widely patent to the basilar. Patent AICA and SCA origins are visualized bilaterally. The basilar artery is widely patent. Posterior communicating arteries are diminutive or absent. Both PCAs are patent. There are moderate proximal left P2 and severe distal right P2 stenoses. No aneurysm is identified. Venous sinuses: As permitted by contrast timing, patent. Anatomic variants: None. Review of the MIP images confirms the above findings These results were communicated to Dr. Christiane Cowing at 12:45 pm on 01/14/2024 by text page via the [facility] messaging system. IMPRESSION: 1. No large vessel occlusion. 2. Intracranial atherosclerosis including severe right M1 and right P2 stenoses. 3. Cervical carotid atherosclerosis without a significant stenosis of the common carotid or internal carotid arteries. 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 Electronically Signed   By: Aundra Lee M.D.   On: 01/14/2024 13:02   CT HEAD CODE STROKE WO CONTRAST Result Date: 01/14/2024 CLINICAL DATA:  Code stroke. Neuro deficit,  acute, stroke suspected. Left-sided weakness. EXAM: CT HEAD WITHOUT CONTRAST TECHNIQUE: Contiguous axial images were obtained from the base of the skull through the vertex without intravenous contrast. RADIATION DOSE REDUCTION:  This exam was performed according to the departmental dose-optimization program which includes automated exposure control, adjustment of the mA and/or kV according to patient size and/or use of iterative reconstruction technique. COMPARISON:  None Available. FINDINGS: Brain: There is no evidence of an acute infarct, intracranial hemorrhage, mass, midline shift, or extra-axial fluid collection. Cerebral volume is within normal limits for age. Patchy hypodensities in the cerebral white matter bilaterally are nonspecific but compatible with mild chronic small vessel ischemic disease. Vascular: Calcified atherosclerosis at the skull base. No hyperdense vessel. Skull: No fracture or suspicious lesion. Hyperostosis frontalis interna. Sinuses/Orbits: Visualized paranasal sinuses and mastoid air cells are clear. Bilateral cataract extraction. Other: None. ASPECTS [facility] Stroke Program Early CT Score) - Ganglionic level infarction (caudate, lentiform nuclei, internal capsule, insula, M1-M3 cortex): 7 - Supraganglionic infarction (M4-M6 cortex): 3 Total score (0-10 with 10 being normal): 10 IMPRESSION: 1. No evidence of acute intracranial abnormality. ASPECTS of 10. 2. Mild chronic small vessel ischemic disease. These results were communicated to Dr. Christiane Cowing at 12:45 pm on 01/14/2024 by text page via the [facility] messaging system. Electronically Signed   By: Aundra Lee M.D.   On: 01/14/2024 12:46   CT ABDOMEN PELVIS W CONTRAST Result Date: 01/02/2024 CLINICAL DATA:  Abdominal pain. Palpitations. Suspected bowel obstruction. EXAM: CT ABDOMEN AND PELVIS WITH CONTRAST TECHNIQUE: Multidetector CT imaging of the abdomen and pelvis was performed using the standard protocol following bolus administration of  intravenous contrast. RADIATION DOSE REDUCTION: This exam was performed according to the departmental dose-optimization program which includes automated exposure control, adjustment of the mA and/or kV according to patient size and/or use of iterative reconstruction technique. CONTRAST:  75mL OMNIPAQUE  IOHEXOL  350 MG/ML SOLN COMPARISON:  None Available. FINDINGS: Lower Chest: Mild compressive bibasilar atelectasis and tiny left pleural effusion. Hepatobiliary: No suspicious hepatic masses identified. A few small hepatic cysts are noted. Gallbladder is unremarkable. No evidence of biliary ductal dilatation. Pancreas:  No mass or inflammatory changes. Spleen: Within normal limits in size and appearance. Adrenals/Urinary Tract: No suspicious masses identified. Small benign-appearing bilateral subcapsular renal cysts incidentally noted (No followup imaging is recommended). No evidence of ureteral calculi or hydronephrosis. Unremarkable unopacified urinary bladder. Stomach/Bowel: A large hiatal hernia is seen which contains nearly the entire stomach, transverse colon, and pancreatic tail. No evidence of bowel obstruction, inflammatory process or abnormal fluid collections. Vascular/Lymphatic: No pathologically enlarged lymph nodes. No acute vascular findings. Reproductive:  No mass or other significant abnormality. Other:  None. Musculoskeletal: No suspicious bone lesions identified. Severe thoracolumbar rotatory levoscoliosis noted. IMPRESSION: No evidence of bowel obstruction or other acute findings. Large hiatal hernia, which contains nearly the entire stomach, transverse colon, and pancreatic tail. Severe thoracolumbar levoscoliosis. Mild bibasilar atelectasis and tiny left pleural effusion. Electronically Signed   By: Marlyce Sine M.D.   On: 01/02/2024 14:30   DG Chest Portable 1 View Result Date: 01/02/2024 CLINICAL DATA:  palpitations EXAM: PORTABLE CHEST 1 VIEW COMPARISON:  April 04, 2023, April 07, 2015  FINDINGS: Evaluation is limited by rotation. Revisualization of a large diaphragmatic hernia. Cardiomediastinal silhouette is grossly unchanged. No pleural effusion or pneumothorax. No acute pleuroparenchymal abnormality. Atherosclerotic calcifications. Severe levo scoliosis of the thoracic spine. IMPRESSION: No acute cardiopulmonary abnormality. Electronically Signed   By: Clancy Crimes M.D.   On: 01/02/2024 13:18    Assessment/Plan: Urinary frequency c/o increased frequent urination last night, 3x,unusual for her, stated it was the symptoms with the past UTIs, denied dysuria, urinary urgency, incontinence, lower abd/back discomfort. Stat UA  C/S Hx of chronic cystitis/urinary frequency.   Dizziness Also c/o dizziness since OOB this morning, lasted longer than the episodes in the past, denied headache, change of vision/speech, SOB, palpitation, nausea, vomiting, or new focal weakness The patient agreed with the following plan  VS+neuro check q4hr x72hrs,  Stat labs, UA C/S Ortho BPs ED eval if no better   History of stroke  CVA, right thalamus MRI brain,  with left sided weakness with muscle strength 5/5 and resolved dysarthria  hospitalized 01/14/24-01/18/24, f/u stroke clinic/neurology. On ASA, Atrovastatin, Eliquis (initially held ASA/Eliquis  due to micro hemorrhage within the lesion and chronic in the posterior left frontal lobe)   Hyponatremia  fluid restriction, combination SIADH(urine osmolality 500s) and dehydration. Na 128 01/18/24  Anxiety GAD/insomnia, on Lorazepam   Hypertension on Diltiazem . Bun/creat 17/0.63 01/18/24  Atrial fibrillation (HCC) Heart rate is controlled,  on Eliquis , Diltiazem   Hypothyroidism  TSH 5.9, on Levothyroxine .   Dyslipidemia  HLD/carotid artery stenosis, on Atorvastatin, LDL 128 01/15/24  Slow transit constipation IBS/constipation, Bene fiber, Lactulose. Last BM 3 days ago, declined other laxatives   Orthostatic hypotension Bp  lying 180/108, standing 92/52, feeling dizzy OOB Will continue to observe, f/u CVD specialist.  01/22/24 Na 131, K 3.5, Bun 18, creat 0.58, wbc 7.7, Hgb 15.1, plt 176, neutrophils 71.4%     Family/ staff Communication: plan of care reviewed with the patient and charge nurse.   Labs/tests ordered: UA C/S, CBC/diff, CMP/eGFR stat

## 2024-01-22 NOTE — Assessment & Plan Note (Signed)
 TSH 5.9, on Levothyroxine .

## 2024-01-22 NOTE — Assessment & Plan Note (Addendum)
 Also c/o dizziness since OOB this morning, lasted longer than the episodes in the past, denied headache, change of vision/speech, SOB, palpitation, nausea, vomiting, or new focal weakness The patient agreed with the following plan  VS+neuro check q4hr x72hrs,  Stat labs, UA C/S Ortho BPs ED eval if no better

## 2024-01-22 NOTE — Assessment & Plan Note (Signed)
 CVA, right thalamus MRI brain,  with left sided weakness with muscle strength 5/5 and resolved dysarthria  hospitalized 01/14/24-01/18/24, f/u stroke clinic/neurology. On ASA, Atrovastatin, Eliquis (initially held ASA/Eliquis  due to micro hemorrhage within the lesion and chronic in the posterior left frontal lobe)

## 2024-01-22 NOTE — Assessment & Plan Note (Signed)
 GAD/insomnia, on Lorazepam 

## 2024-01-22 NOTE — Assessment & Plan Note (Signed)
 Heart rate is controlled,  on Eliquis , Diltiazem 

## 2024-01-22 NOTE — Assessment & Plan Note (Signed)
 IBS/constipation, Bene fiber, Lactulose. Last BM 3 days ago, declined other laxatives

## 2024-01-22 NOTE — Assessment & Plan Note (Addendum)
 c/o increased frequent urination last night, 3x,unusual for her, stated it was the symptoms with the past UTIs, denied dysuria, urinary urgency, incontinence, lower abd/back discomfort. Stat UA C/S Hx of chronic cystitis/urinary frequency.

## 2024-01-22 NOTE — Assessment & Plan Note (Signed)
 Bp lying 180/108, standing 92/52, feeling dizzy OOB Will continue to observe, f/u CVD specialist.  01/22/24 Na 131, K 3.5, Bun 18, creat 0.58, wbc 7.7, Hgb 15.1, plt 176, neutrophils 71.4%

## 2024-01-22 NOTE — Assessment & Plan Note (Signed)
 HLD/carotid artery stenosis, on Atorvastatin, LDL 128 01/15/24

## 2024-01-25 ENCOUNTER — Encounter: Payer: Self-pay | Admitting: Sports Medicine

## 2024-01-25 ENCOUNTER — Non-Acute Institutional Stay (SKILLED_NURSING_FACILITY): Payer: Self-pay | Admitting: Sports Medicine

## 2024-01-25 DIAGNOSIS — I480 Paroxysmal atrial fibrillation: Secondary | ICD-10-CM

## 2024-01-25 DIAGNOSIS — E222 Syndrome of inappropriate secretion of antidiuretic hormone: Secondary | ICD-10-CM

## 2024-01-25 DIAGNOSIS — I639 Cerebral infarction, unspecified: Secondary | ICD-10-CM

## 2024-01-25 DIAGNOSIS — N3000 Acute cystitis without hematuria: Secondary | ICD-10-CM | POA: Diagnosis not present

## 2024-01-25 DIAGNOSIS — I10 Essential (primary) hypertension: Secondary | ICD-10-CM

## 2024-01-25 NOTE — Progress Notes (Signed)
 Location:  [facility] Room Number: 22A Place of Service:  SNF [phone]) Provider: Sherlynn Madden MD   Jerome Heron Ruth, PA-C  Patient Care Team: Jerome Heron Ruth DEVONNA as PCP - General (Physician Assistant) Cindie Ole DASEN, MD as PCP - Electrophysiology (Cardiology) Mona Vinie BROCKS, MD as PCP - Cardiology (Cardiology) Dann Candyce RAMAN, MD as Consulting Physician (Cardiology)  Extended Emergency Contact Information Primary Emergency Contact: Courtland Devere [place], [place] [address] [place] Home Phone: [phone] Mobile Phone: [phone] Relation: Niece  Code Status:  Full Code  Goals of care: Advanced Directive information    01/21/2024   11:08 AM  Advanced Directives  Does Patient Have a Medical Advance Directive? Yes  Type of Estate agent of [place];Living will  Does patient want to make changes to medical advance directive? No - Patient declined  Copy of Healthcare Power of Attorney in Chart? Yes - validated most recent copy scanned in chart (See row information)     Chief Complaint  Patient presents with   Acute Visit    Patient is being seen for an acute visit regarding blood pressure & hyponatremia    HPI:  Pt is a 83 y.o. female with past medical history of CVA, hyponatremia, GAD, hypertension, A-fib, hypothyroidism, hyperlipidemia, carotid artery is stenosis seen today for an acute visit for follow-up on blood pressure, urine results. Patient seen and examined in her room.  She is sitting in wheelchair.  Seems pleasant and comfortable and does not appear to be in distress. Patient is requiring 1 person assistance for transferring from bed to chair. Patient denies headache, nausea, vomiting, dizzy, lightheadedness.  Her orthostatic vitals were positive with supine blood pressure being high in 180s.  She was started on amlodipine for elevated blood pressure.  She is currently on  Cardizem , Eliquis  for A-fib. Patient reports nocturia and urinary frequency.  Denies lower abdominal pain, nausea, vomiting. Urine dipstick positive and urine culture showed E. coli. Patient states she can only take Macrobid and sensitive to most all antibiotics.    Past Medical History:  Diagnosis Date   Atrial fibrillation (HCC)    echo 12/23/10 EF= >55%, stress myoview 01/30/11 normal pattern of perfusion in all myocardial regions   Carotid artery stenosis    Per [facility] new patient packet    Chicken pox    Colon polyp    Colon polyp    Dyslipidemia    Heart murmur    Hiatal hernia    Hypertension    Hypothyroidism    Laryngopharyngeal reflux    Osteopenia    Scoliosis    Seasonal allergies    Thyroid  disease    Vitamin D  deficiency    Past Surgical History:  Procedure Laterality Date   BREAST CYST EXCISION Right 1988   ESOPHAGEAL MANOMETRY N/A 08/29/2015   Procedure: ESOPHAGEAL MANOMETRY (EM);  Surgeon: Elspeth Deward Naval, MD;  Location: [facility];  Service: Gastroenterology;  Laterality: N/A;   EYE SURGERY     Cataract eye surgery-right 06/2014, left 04/2014   ORIF PATELLA Right 07/08/2022   Procedure: OPEN REDUCTION INTERNAL FIXATION (ORIF) PATELLA;  Surgeon: Josefina Chew, MD;  Location: [facility];  Service: Orthopedics;  Laterality: Right;   TONSILLECTOMY     Childhood    Allergies  Allergen Reactions   Amoxicillin Swelling   Flonase [Fluticasone Propionate] Swelling    Swelling of throat per patient    Sulfa  Antibiotics Other (See Comments)    Upset stomach   Sulfasalazine Other (See Comments)    Upset stomach    Allergies as of 01/25/2024       Reactions   Amoxicillin Swelling   Flonase [fluticasone Propionate] Swelling   Swelling of throat per patient   Sulfa Antibiotics Other (See Comments)   Upset stomach   Sulfasalazine Other (See Comments)   Upset stomach        Medication List        Accurate as of January 25, 2024  10:37 AM. If you have any questions, ask your nurse or doctor.          ACID REDUCER PO Take 1 tablet by mouth at bedtime as needed.   amLODipine 5 MG tablet Commonly known as: NORVASC Take 5 mg by mouth daily.   aspirin  EC 81 MG tablet Take 1 tablet (81 mg total) by mouth daily. Swallow whole.   atorvastatin  40 MG tablet Commonly known as: LIPITOR Take 1 tablet (40 mg total) by mouth daily.   BENEFIBER PO Take 0.5 Scoops by mouth as needed.   diltiazem  180 MG 24 hr capsule Commonly known as: Cartia  XT Take 1 capsule (180 mg total) by mouth at bedtime.   Eliquis  2.5 MG Tabs tablet Generic drug: apixaban  Take 2.5 mg by mouth 2 (two) times daily.   GAS-X PO Take 1 tablet by mouth as needed.   lactulose  10 GM/15ML solution Commonly known as: CHRONULAC  Take 30 mLs by mouth daily.   lactulose  10 GM/15ML solution Commonly known as: CHRONULAC  Take 30 mLs (20 g total) by mouth daily.   levothyroxine  125 MCG tablet Commonly known as: SYNTHROID  TAKE 1 TABLET BY MOUTH DAILY BEFORE BREAKFAST.   LORazepam  0.5 MG tablet Commonly known as: ATIVAN  Take 1 tablet by mouth as needed for anxiety.   LORazepam  0.5 MG tablet Commonly known as: ATIVAN  Take 1 tablet (0.5 mg total) by mouth 2 (two) times daily as needed for anxiety.   MULTIVITAMINS PO Take 2 each by mouth every evening. Gummies   Orajel 3X Toothache & Gum 20-0.26-0.15 % Gel Generic drug: Benzocaine-Menthol-Zinc Cl Use as directed in the mouth or throat as needed (Place and dissolve 1 application buccally as needed for mouth pain may keep at bedside).   predniSONE 20 MG tablet Commonly known as: DELTASONE Take 40 mg by mouth daily with breakfast.   PRESERVISION AREDS PO Take 1 tablet by mouth at bedtime.   MULTIVITAMIN GUMMIES ADULT PO Take 2 Pieces by mouth every evening.   Probiotic Chew Chew 1 each by mouth every evening.   RA CRANBERRY SUPPLEMENTS PO Take 2 tablets by mouth every evening.    Vitamin D -3 25 MCG (1000 UT) Caps Take 1,000 Units by mouth in the morning.        Review of Systems  Constitutional:  Negative for fever.  Respiratory:  Negative for cough, shortness of breath and wheezing.   Cardiovascular:  Negative for chest pain, palpitations and leg swelling.  Gastrointestinal:  Negative for abdominal distention, abdominal pain, blood in stool, constipation, diarrhea, nausea and vomiting.  Genitourinary:  Positive for frequency. Negative for dysuria.  Neurological:  Negative for dizziness.    Immunization History  Administered Date(s) Administered   Influenza, High Dose Seasonal PF 06/05/2015, 06/13/2016, 06/15/2017, 05/31/2018, 05/16/2019, 05/16/2020   Influenza,inj,Quad PF,6+ Mos 05/19/2014   Influenza,inj,quad, With Preservative 05/31/2018   Moderna SARS-COV2 Booster Vaccination 06/12/2020   Moderna Sars-Covid-2 Vaccination 08/08/2019, 09/05/2019  Tdap 08/04/2010   Zoster, Live 05/13/2011   Pertinent  Health Maintenance Due  Topic Date Due   INFLUENZA VACCINE  03/04/2024   DEXA SCAN  Discontinued      03/08/2020    9:13 AM 09/30/2020    3:25 PM 12/13/2020    3:25 PM 07/07/2022   11:36 AM 07/08/2022   10:43 AM  Fall Risk  Falls in the past year? 1  0    Was there an injury with Fall? 0  0    Fall Risk Category Calculator 1  0    Fall Risk Category (Retired) Low   Low     (RETIRED) Patient Fall Risk Level  Low fall risk  Low fall risk  Low fall risk  High fall risk      Data saved with a previous flowsheet row definition   Functional Status Survey:    Vitals:   01/25/24 1028  BP: 130/84  Pulse: 68  Resp: 18  Temp: 97.8 F (36.6 C)  SpO2: 94%  Weight: 115 lb 8 oz (52.4 kg)  Height: 5' 5 (1.651 m)   Body mass index is 19.22 kg/m. Physical Exam Constitutional:      Appearance: Normal appearance.  HENT:     Head: Normocephalic and atraumatic.   Cardiovascular:     Rate and Rhythm: Normal rate and regular rhythm.  Pulmonary:      Effort: Pulmonary effort is normal. No respiratory distress.     Breath sounds: Normal breath sounds. No wheezing.  Abdominal:     General: Bowel sounds are normal. There is no distension.     Tenderness: There is no abdominal tenderness. There is no guarding or rebound.     Comments:     Musculoskeletal:        General: No swelling.   Skin:    General: Skin is dry.   Neurological:     Mental Status: She is alert. Mental status is at baseline.     Comments: Left side slightly weaker than right side     Labs reviewed: Recent Labs    01/02/24 2248 01/03/24 0214 01/14/24 1224 01/14/24 1226 01/16/24 1819 01/17/24 1129 01/18/24 0443  NA  --    < > 129*   < > 127* 127* 128*  K  --    < > 3.9   < > 3.3* 4.1 3.8  CL  --    < > 96*   < > 91* 93* 95*  CO2  --    < > 23   < > 25 25 25   GLUCOSE  --    < > 113*   < > 92 109* 88  BUN  --    < > 19   < > 16 11 17   CREATININE  --    < > 0.89   < > 0.70 0.56 0.63  CALCIUM   --    < > 9.2   < > 8.7* 8.8* 8.5*  MG 1.9  --  1.9  --  1.8 2.0  --   PHOS 3.1  --   --   --   --   --   --    < > = values in this interval not displayed.   Recent Labs    01/02/24 1208 01/14/24 1224 01/15/24 0543  AST 24 26 34  ALT 20 20 22   ALKPHOS 64 56 68  BILITOT 0.9 0.9 1.1  PROT 6.2* 6.5 6.2*  ALBUMIN 3.8  3.9 3.6   Recent Labs    01/14/24 1224 01/14/24 1226 01/15/24 0543 01/16/24 1111  WBC 5.4  --  5.9 6.7  NEUTROABS 3.9  --   --   --   HGB 13.6 13.6 13.8 14.4  HCT 38.8 40.0 39.1 40.5  MCV 93.9  --  93.5 92.0  PLT 153  --  165 154   Lab Results  Component Value Date   TSH 5.952 (H) 01/14/2024   Lab Results  Component Value Date   HGBA1C 5.0 01/15/2024   Lab Results  Component Value Date   CHOL 237 (H) 01/15/2024   HDL 101 01/15/2024   LDLCALC 128 (H) 01/15/2024   LDLDIRECT 148.1 08/17/2013   TRIG 42 01/15/2024   CHOLHDL 2.3 01/15/2024    Significant Diagnostic Results in last 30 days:  MR BRAIN WO CONTRAST Result Date:  01/14/2024 CLINICAL DATA:  Stroke follow-up. EXAM: MRI HEAD WITHOUT CONTRAST TECHNIQUE: Multiplanar, multiecho pulse sequences of the brain and surrounding structures were obtained without intravenous contrast. COMPARISON:  CT head and CTA head and neck earlier same day. FINDINGS: Brain: 10 mm focus of restricted diffusion in the right thalamus compatible with acute infarct. Chronic microhemorrhage in the posterior left frontal lobe. Small focus of susceptibility in the right thalamus corresponding to the region of infarct which may reflect petechial hemorrhage. T2/FLAIR hyperintensity in the periventricular and subcortical white matter. Mild parenchymal volume loss. No edema, mass effect, or midline shift. Posterior fossa is unremarkable. Normal appearance of midline structures. The basilar cisterns are patent. No extra-axial fluid collections. Ventricles: Normal size and configuration of the ventricles. Vascular: Skull base flow voids are visualized. Skull and upper cervical spine: No focal abnormality. Sinuses/Orbits: Bilateral lens replacement. Paranasal sinuses are clear. Other: Mastoid air cells are clear. IMPRESSION: Acute infarct in the right thalamus. Small focus of susceptibility within this region which may reflect developing petechial hemorrhage. Recommend short interval follow-up head CT. No significant edema or midline shift. Mild chronic microvascular ischemic changes and mild parenchymal volume loss. Chronic microhemorrhage in the posterior left frontal lobe. Electronically Signed   By: Donnice Mania M.D.   On: 01/14/2024 20:21   ECHOCARDIOGRAM COMPLETE Result Date: 01/14/2024    ECHOCARDIOGRAM REPORT   Patient Name:   Porschia Willbanks Date of Exam: 01/14/2024 Medical Rec #:  [id number]       Height:       65.0 in Accession #:    [id number]      Weight:       125.7 lb Date of Birth:  1940-12-06        BSA:          1.624 m Patient Age:    83 years        BP:           169/84 mmHg Patient Gender: F                HR:           74 bpm. Exam Location:  Inpatient Procedure: 2D Echo, Cardiac Doppler and Color Doppler (Both Spectral and Color            Flow Doppler were utilized during procedure). Indications:    Stroke  History:        Patient has prior history of Echocardiogram examinations, most                 recent 05/11/2013. Aortic Valve Disease, Arrythmias:Atrial  Fibrillation, Signs/Symptoms:Murmur; Risk Factors:Hypertension.  Sonographer:    Jayson Gaskins Referring Phys: MARIO GAILS PATEL IMPRESSIONS  1. Left ventricular ejection fraction, by estimation, is 65 to 70%. The left ventricle has normal function. The left ventricle has no regional wall motion abnormalities. There is mild left ventricular hypertrophy. Left ventricular diastolic function could not be evaluated.  2. Right ventricular systolic function is normal. The right ventricular size is normal.  3. Left atrial size was severely dilated.  4. The mitral valve is abnormal. Trivial mitral valve regurgitation. Mild mitral stenosis. Severe mitral annular calcification.  5. The aortic valve is calcified. There is moderate calcification of the aortic valve. There is moderate thickening of the aortic valve. Aortic valve regurgitation is mild to moderate. Mild aortic valve stenosis. Comparison(s): Prior images unable to be directly viewed, comparison made by report only. Conclusion(s)/Recommendation(s): EF unchanged, mild mitral stenosis, mild aortic stenosis. FINDINGS  Left Ventricle: Left ventricular ejection fraction, by estimation, is 65 to 70%. The left ventricle has normal function. The left ventricle has no regional wall motion abnormalities. The left ventricular internal cavity size was normal in size. There is  mild left ventricular hypertrophy. Left ventricular diastolic function could not be evaluated due to mitral annular calcification (moderate or greater). Left ventricular diastolic function could not be evaluated. Right Ventricle:  The right ventricular size is normal. No increase in right ventricular wall thickness. Right ventricular systolic function is normal. Left Atrium: Left atrial size was severely dilated. Right Atrium: Right atrial size was normal in size. Pericardium: There is no evidence of pericardial effusion. Mitral Valve: The mitral valve is abnormal. Severe mitral annular calcification. Trivial mitral valve regurgitation. Mild mitral valve stenosis. MV peak gradient, 16.8 mmHg. The mean mitral valve gradient is 6.0 mmHg. Tricuspid Valve: The tricuspid valve is normal in structure. Tricuspid valve regurgitation is trivial. No evidence of tricuspid stenosis. Aortic Valve: The aortic valve is calcified. There is moderate calcification of the aortic valve. There is moderate thickening of the aortic valve. Aortic valve regurgitation is mild to moderate. Mild aortic stenosis is present. Aortic valve mean gradient measures 17.0 mmHg. Aortic valve peak gradient measures 36.5 mmHg. Aortic valve area, by VTI measures 1.43 cm. Pulmonic Valve: The pulmonic valve was not well visualized. Pulmonic valve regurgitation is not visualized. No evidence of pulmonic stenosis. Aorta: The aortic root is normal in size and structure. Venous: The inferior vena cava was not well visualized. IAS/Shunts: The atrial septum is grossly normal.  LEFT VENTRICLE PLAX 2D LVIDd:         3.10 cm   Diastology LVIDs:         1.50 cm   LV e' medial:    5.87 cm/s LV PW:         1.10 cm   LV E/e' medial:  17.9 LV IVS:        1.27 cm   LV e' lateral:   7.72 cm/s LVOT diam:     1.80 cm   LV E/e' lateral: 13.6 LV SV:         96 LV SV Index:   59 LVOT Area:     2.54 cm  RIGHT VENTRICLE RV S prime:     13.30 cm/s TAPSE (M-mode): 2.1 cm LEFT ATRIUM             Index        RIGHT ATRIUM          Index LA Vol (A2C):   64.3 ml  39.60 ml/m  RA Area:     7.20 cm LA Vol (A4C):   85.1 ml 52.41 ml/m  RA Volume:   10.40 ml 6.41 ml/m LA Biplane Vol: 77.8 ml 47.92 ml/m  AORTIC  VALVE AV Area (Vmax):    1.39 cm AV Area (Vmean):   1.58 cm AV Area (VTI):     1.43 cm AV Vmax:           302.00 cm/s AV Vmean:          187.000 cm/s AV VTI:            0.669 m AV Peak Grad:      36.5 mmHg AV Mean Grad:      17.0 mmHg LVOT Vmax:         165.00 cm/s LVOT Vmean:        116.000 cm/s LVOT VTI:          0.376 m LVOT/AV VTI ratio: 0.56  AORTA Ao Root diam: 3.00 cm MITRAL VALVE MV Area (PHT): 3.27 cm     SHUNTS MV Area VTI:   2.28 cm     Systemic VTI:  0.38 m MV Peak grad:  16.8 mmHg    Systemic Diam: 1.80 cm MV Mean grad:  6.0 mmHg MV Vmax:       2.05 m/s MV Vmean:      115.0 cm/s MV Decel Time: 232 msec MV E velocity: 105.00 cm/s MV A velocity: 179.00 cm/s MV E/A ratio:  0.59 Shelda Bruckner MD Electronically signed by Shelda Bruckner MD Signature Date/Time: 01/14/2024/7:40:23 PM    Final    CT ANGIO HEAD NECK W WO CM (CODE STROKE) Result Date: 01/14/2024 CLINICAL DATA:  Neuro deficit, acute, stroke suspected. Left-sided weakness. EXAM: CT ANGIOGRAPHY HEAD AND NECK WITH AND WITHOUT CONTRAST TECHNIQUE: Multidetector CT imaging of the head and neck was performed using the standard protocol during bolus administration of intravenous contrast. Multiplanar CT image reconstructions and MIPs were obtained to evaluate the vascular anatomy. Carotid stenosis measurements (when applicable) are obtained utilizing NASCET criteria, using the distal internal carotid diameter as the denominator. RADIATION DOSE REDUCTION: This exam was performed according to the departmental dose-optimization program which includes automated exposure control, adjustment of the mA and/or kV according to patient size and/or use of iterative reconstruction technique. CONTRAST:  75mL OMNIPAQUE  IOHEXOL  350 MG/ML SOLN COMPARISON:  None Available. FINDINGS: CTA NECK FINDINGS Aortic arch: Incomplete imaging of the aortic arch, including the origins of the brachiocephalic and left common carotid arteries. The brachiocephalic  and subclavian arteries are patent with calcified plaque at the origin of the right subclavian artery resulting in no more than mild stenosis. Right carotid system: Patent with calcified plaque at the carotid bifurcation resulting in severe stenosis of the origin of the external carotid artery. No evidence of a significant stenosis or dissection of the common carotid or cervical internal carotid arteries. Left carotid system: Patent with calcified plaque at the carotid bifurcation. No evidence of a significant stenosis or dissection. Vertebral arteries: Patent without evidence of a significant stenosis or dissection. Moderately dominant left vertebral artery. Skeleton: Widespread advanced cervical disc degeneration. Potentially moderate spinal stenosis at C4-5. Advanced multilevel neural foraminal stenosis. Other neck: No evidence of cervical lymphadenopathy or mass. Upper chest: Clear lung apices. Review of the MIP images confirms the above findings CTA HEAD FINDINGS Anterior circulation: The internal carotid arteries are patent from skull base to carotid termini with mild atherosclerotic irregularity bilaterally but no significant stenosis.  ACAs and MCAs are patent without evidence of a proximal branch occlusion or significant A1 or left M1 stenosis. There is a severe proximal right M1 stenosis, and mild bilateral MCA and ACA branch vessel irregularity is noted. No aneurysm is identified. Posterior circulation: The intracranial vertebral arteries are widely patent to the basilar. Patent AICA and SCA origins are visualized bilaterally. The basilar artery is widely patent. Posterior communicating arteries are diminutive or absent. Both PCAs are patent. There are moderate proximal left P2 and severe distal right P2 stenoses. No aneurysm is identified. Venous sinuses: As permitted by contrast timing, patent. Anatomic variants: None. Review of the MIP images confirms the above findings These results were communicated  to Dr. Jerri at 12:45 pm on 01/14/2024 by text page via the [facility] messaging system. IMPRESSION: 1. No large vessel occlusion. 2. Intracranial atherosclerosis including severe right M1 and right P2 stenoses. 3. Cervical carotid atherosclerosis without a significant stenosis of the common carotid or internal carotid arteries. 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 Electronically Signed   By: Dasie Hamburg M.D.   On: 01/14/2024 13:02   CT HEAD CODE STROKE WO CONTRAST Result Date: 01/14/2024 CLINICAL DATA:  Code stroke. Neuro deficit, acute, stroke suspected. Left-sided weakness. EXAM: CT HEAD WITHOUT CONTRAST TECHNIQUE: Contiguous axial images were obtained from the base of the skull through the vertex without intravenous contrast. RADIATION DOSE REDUCTION: This exam was performed according to the departmental dose-optimization program which includes automated exposure control, adjustment of the mA and/or kV according to patient size and/or use of iterative reconstruction technique. COMPARISON:  None Available. FINDINGS: Brain: There is no evidence of an acute infarct, intracranial hemorrhage, mass, midline shift, or extra-axial fluid collection. Cerebral volume is within normal limits for age. Patchy hypodensities in the cerebral white matter bilaterally are nonspecific but compatible with mild chronic small vessel ischemic disease. Vascular: Calcified atherosclerosis at the skull base. No hyperdense vessel. Skull: No fracture or suspicious lesion. Hyperostosis frontalis interna. Sinuses/Orbits: Visualized paranasal sinuses and mastoid air cells are clear. Bilateral cataract extraction. Other: None. ASPECTS [facility] Stroke Program Early CT Score) - Ganglionic level infarction (caudate, lentiform nuclei, internal capsule, insula, M1-M3 cortex): 7 - Supraganglionic infarction (M4-M6 cortex): 3 Total score (0-10 with 10 being normal): 10 IMPRESSION: 1. No evidence of acute intracranial abnormality. ASPECTS of 10. 2. Mild  chronic small vessel ischemic disease. These results were communicated to Dr. Jerri at 12:45 pm on 01/14/2024 by text page via the [facility] messaging system. Electronically Signed   By: Dasie Hamburg M.D.   On: 01/14/2024 12:46   CT ABDOMEN PELVIS W CONTRAST Result Date: 01/02/2024 CLINICAL DATA:  Abdominal pain. Palpitations. Suspected bowel obstruction. EXAM: CT ABDOMEN AND PELVIS WITH CONTRAST TECHNIQUE: Multidetector CT imaging of the abdomen and pelvis was performed using the standard protocol following bolus administration of intravenous contrast. RADIATION DOSE REDUCTION: This exam was performed according to the departmental dose-optimization program which includes automated exposure control, adjustment of the mA and/or kV according to patient size and/or use of iterative reconstruction technique. CONTRAST:  75mL OMNIPAQUE  IOHEXOL  350 MG/ML SOLN COMPARISON:  None Available. FINDINGS: Lower Chest: Mild compressive bibasilar atelectasis and tiny left pleural effusion. Hepatobiliary: No suspicious hepatic masses identified. A few small hepatic cysts are noted. Gallbladder is unremarkable. No evidence of biliary ductal dilatation. Pancreas:  No mass or inflammatory changes. Spleen: Within normal limits in size and appearance. Adrenals/Urinary Tract: No suspicious masses identified. Small benign-appearing bilateral subcapsular renal cysts incidentally noted (No followup imaging  is recommended). No evidence of ureteral calculi or hydronephrosis. Unremarkable unopacified urinary bladder. Stomach/Bowel: A large hiatal hernia is seen which contains nearly the entire stomach, transverse colon, and pancreatic tail. No evidence of bowel obstruction, inflammatory process or abnormal fluid collections. Vascular/Lymphatic: No pathologically enlarged lymph nodes. No acute vascular findings. Reproductive:  No mass or other significant abnormality. Other:  None. Musculoskeletal: No suspicious bone lesions identified. Severe  thoracolumbar rotatory levoscoliosis noted. IMPRESSION: No evidence of bowel obstruction or other acute findings. Large hiatal hernia, which contains nearly the entire stomach, transverse colon, and pancreatic tail. Severe thoracolumbar levoscoliosis. Mild bibasilar atelectasis and tiny left pleural effusion. Electronically Signed   By: Norleen DELENA Kil M.D.   On: 01/02/2024 14:30   DG Chest Portable 1 View Result Date: 01/02/2024 CLINICAL DATA:  palpitations EXAM: PORTABLE CHEST 1 VIEW COMPARISON:  April 04, 2023, April 07, 2015 FINDINGS: Evaluation is limited by rotation. Revisualization of a large diaphragmatic hernia. Cardiomediastinal silhouette is grossly unchanged. No pleural effusion or pneumothorax. No acute pleuroparenchymal abnormality. Atherosclerotic calcifications. Severe levo scoliosis of the thoracic spine. IMPRESSION: No acute cardiopulmonary abnormality. Electronically Signed   By: Corean Salter M.D.   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

## 2024-01-26 ENCOUNTER — Encounter: Payer: Self-pay | Admitting: Nurse Practitioner

## 2024-02-09 ENCOUNTER — Inpatient Hospital Stay: Admitting: Neurology

## 2024-02-10 ENCOUNTER — Telehealth: Payer: Self-pay | Admitting: Adult Health

## 2024-02-10 NOTE — Progress Notes (Unsigned)
 [facility] [address] [place]. [place] [address] [phone]       HOSPITAL FOLLOW UP NOTE  [name] Date of Birth:  1941-01-16 Medical Record Number:  [id number]   Reason for Referral:  hospital stroke follow up    SUBJECTIVE:   CHIEF COMPLAINT:  No chief complaint on file.   HPI:   [name] is a 83 y.o. female with history of PAF on eliquis , HLD, HTN, hypothyroidism, and anxiety who presented to ED on 01/14/2024 with sudden onset left sided weakness, mild slurred speech and feeling lightheaded.  Stroke workup revealed right BG infarct with petechial hemorrhage likely secondary to large vessel disease source given right M1 high-grade stenosis, further workup noted below.  Continued on Eliquis  2.5 mg twice daily and added aspirin  81 mg daily and atorvastatin  40 mg daily, LDL 128.  Therapies recommended SNF        PERTINENT IMAGING  CT no acute finding CT head and neck showed right M1 high-grade stenosis MRI  Acute infarct in the right thalamus. Small focus of susceptibility within this region which may reflect developing petechial hemorrhage.  2D Echo EF 65 to 70% LDL 128 HgbA1c 5.0    ROS:   14 system review of systems performed and negative with exception of ***  PMH:  Past Medical History:  Diagnosis Date   Atrial fibrillation (HCC)    echo 12/23/10 EF= >55%, stress myoview 01/30/11 normal pattern of perfusion in all myocardial regions   Carotid artery stenosis    Per [facility] new patient packet    Chicken pox    Colon polyp    Colon polyp    Dyslipidemia    Heart murmur    Hiatal hernia    Hypertension    Hypothyroidism    Laryngopharyngeal reflux    Osteopenia    Scoliosis    Seasonal allergies    Thyroid  disease    Vitamin D  deficiency     PSH:  Past Surgical History:  Procedure Laterality Date   BREAST CYST EXCISION Right 1988   ESOPHAGEAL MANOMETRY N/A 08/29/2015   Procedure: ESOPHAGEAL MANOMETRY  (EM);  Surgeon: Elspeth Deward Naval, MD;  Location: [facility];  Service: Gastroenterology;  Laterality: N/A;   EYE SURGERY     Cataract eye surgery-right 06/2014, left 04/2014   ORIF PATELLA Right 07/08/2022   Procedure: OPEN REDUCTION INTERNAL FIXATION (ORIF) PATELLA;  Surgeon: Josefina Chew, MD;  Location: [facility];  Service: Orthopedics;  Laterality: Right;   TONSILLECTOMY     Childhood    Social History:  Social History   Socioeconomic History   Marital status: Single    Spouse name: Not on file   Number of children: Not on file   Years of education: Not on file   Highest education level: Not on file  Occupational History   Occupation: retired  Tobacco Use   Smoking status: Never   Smokeless tobacco: Never  Substance and Sexual Activity   Alcohol use: Not Currently    Alcohol/week: 1.0 standard drink of alcohol    Types: 1 Glasses of wine per week    Comment: 1 glass of wine 3x a week   Drug use: No   Sexual activity: Not on file  Other Topics Concern   Not on file  Social History Narrative   Work or School: Clinical research associate - poetry      Home Situation: lives alone      Spiritual Beliefs: Jewish  Lifestyle: [facility] [employer] [facility]; diet healthy      Per [facility] New Patient Packet:      Diet: Heart healthy, low-fat      Caffeine: 1 cup of tea daily       Married, if yes what year: Single      Do you live in a house, apartment, assisted living, condo, trailer, ect: Apartment, 1 person, 7 stories       Pets: No      Current/Past profession: PHD, Child psychotherapist      Exercise: Yes, Treadmill, weights, and walking          Living Will: Yes   DNR: Yes   POA/HPOA: Yes      Functional Status:   Do you have difficulty bathing or dressing yourself? No   Do you have difficulty preparing food or eating? No   Do you have difficulty managing your medications? No   Do you have difficulty managing your finances? No   Do you have difficulty  affording your medications? No         Social Drivers of Corporate investment banker Strain: Not on file  Food Insecurity: No Food Insecurity (01/14/2024)   Hunger Vital Sign    Worried About Running Out of Food in the Last Year: Never true    Ran Out of Food in the Last Year: Never true  Transportation Needs: No Transportation Needs (01/14/2024)   PRAPARE - Administrator, Civil Service (Medical): No    Lack of Transportation (Non-Medical): No  Physical Activity: Not on file  Stress: Not on file  Social Connections: Unknown (01/14/2024)   Social Connection and Isolation Panel    Frequency of Communication with Friends and Family: More than three times a week    Frequency of Social Gatherings with Friends and Family: Three times a week    Attends Religious Services: 1 to 4 times per year    Active Member of Clubs or Organizations: Yes    Attends Banker Meetings: More than 4 times per year    Marital Status: Not on file  Intimate Partner Violence: Not At Risk (01/14/2024)   Humiliation, Afraid, Rape, and Kick questionnaire    Fear of Current or Ex-Partner: No    Emotionally Abused: No    Physically Abused: No    Sexually Abused: No    Family History:  Family History  Problem Relation Age of Onset   CVA Mother    Hyperlipidemia Mother    Hypertension Mother    Anuerysm Father        brain   Stroke Maternal Grandmother    Hypertension Maternal Grandmother    Hypertension Sister    Scoliosis Sister    Colon cancer Neg Hx    Heart attack Neg Hx    Breast cancer Neg Hx     Medications:   Current Outpatient Medications on File Prior to Visit  Medication Sig Dispense Refill   amLODipine (NORVASC) 5 MG tablet Take 5 mg by mouth daily.     aspirin  EC 81 MG tablet Take 1 tablet (81 mg total) by mouth daily. Swallow whole. 30 tablet 12   atorvastatin  (LIPITOR) 40 MG tablet Take 1 tablet (40 mg total) by mouth daily. 30 tablet 2    Benzocaine-Menthol-Zinc Cl (ORAJEL 3X TOOTHACHE & GUM) 20-0.26-0.15 % GEL Use as directed in the mouth or throat as needed (Place and dissolve 1 application buccally as  needed for mouth pain may keep at bedside).     Cholecalciferol  (VITAMIN D -3) 1000 UNITS CAPS Take 1,000 Units by mouth in the morning.     Cranberry-Vit C-Lactobacillus (RA CRANBERRY SUPPLEMENTS PO) Take 2 tablets by mouth every evening.     diltiazem  (CARTIA  XT) 180 MG 24 hr capsule Take 1 capsule (180 mg total) by mouth at bedtime. 90 capsule 3   ELIQUIS  2.5 MG TABS tablet Take 2.5 mg by mouth 2 (two) times daily.     Famotidine (ACID REDUCER PO) Take 1 tablet by mouth at bedtime as needed. (Patient not taking: Reported on 01/25/2024)     lactulose  (CHRONULAC ) 10 GM/15ML solution Take 30 mLs (20 g total) by mouth daily. (Patient not taking: Reported on 01/25/2024) 236 mL 0   lactulose  (CHRONULAC ) 10 GM/15ML solution Take 30 mLs by mouth daily.     levothyroxine  (SYNTHROID ) 125 MCG tablet TAKE 1 TABLET BY MOUTH DAILY BEFORE BREAKFAST. 90 tablet 1   LORazepam  (ATIVAN ) 0.5 MG tablet Take 1 tablet (0.5 mg total) by mouth 2 (two) times daily as needed for anxiety. 30 tablet 2   LORazepam  (ATIVAN ) 0.5 MG tablet Take 1 tablet by mouth as needed for anxiety.     Multiple Vitamin (MULTIVITAMINS PO) Take 2 each by mouth every evening. Gummies (Patient not taking: Reported on 01/25/2024)     Multiple Vitamins-Minerals (MULTIVITAMIN GUMMIES ADULT PO) Take 2 Pieces by mouth every evening.     Multiple Vitamins-Minerals (PRESERVISION AREDS PO) Take 1 tablet by mouth at bedtime.     predniSONE (DELTASONE) 20 MG tablet Take 40 mg by mouth daily with breakfast. (Patient not taking: Reported on 01/25/2024)     Probiotic CHEW Chew 1 each by mouth every evening.     Simethicone (GAS-X PO) Take 1 tablet by mouth as needed.     Wheat Dextrin (BENEFIBER PO) Take 0.5 Scoops by mouth as needed.     No current facility-administered medications on file prior  to visit.    Allergies:   Allergies  Allergen Reactions   Amoxicillin Swelling   Flonase [Fluticasone Propionate] Swelling    Swelling of throat per patient    Sulfa Antibiotics Other (See Comments)    Upset stomach   Sulfasalazine Other (See Comments)    Upset stomach      OBJECTIVE:  Physical Exam  There were no vitals filed for this visit. There is no height or weight on file to calculate BMI. No results found.   General: well developed, well nourished, seated, in no evident distress Head: head normocephalic and atraumatic.   Neck: supple with no carotid or supraclavicular bruits Cardiovascular: regular rate and rhythm, no murmurs Musculoskeletal: no deformity Skin:  no rash/petichiae Vascular:  Normal pulses all extremities   Neurologic Exam Mental Status: Awake and fully alert. Oriented to place and time. Recent and remote memory intact. Attention span, concentration and fund of knowledge appropriate. Mood and affect appropriate.  Cranial Nerves: Fundoscopic exam reveals sharp disc margins. Pupils equal, briskly reactive to light. Extraocular movements full without nystagmus. Visual fields full to confrontation. Hearing intact. Facial sensation intact. Face, tongue, palate moves normally and symmetrically.  Motor: Normal bulk and tone. Normal strength in all tested extremity muscles Sensory.: intact to touch , pinprick , position and vibratory sensation.  Coordination: Rapid alternating movements normal in all extremities. Finger-to-nose and heel-to-shin performed accurately bilaterally. Gait and Station: Arises from chair without difficulty. Stance is normal. Gait demonstrates normal stride length and balance  with ***. Tandem walk and heel toe ***.  Reflexes: 1+ and symmetric. Toes downgoing.     NIHSS  *** Modified Rankin  ***      ASSESSMENT: Stacy [name] is a 83 y.o. year old female with right BG infarct with petechial hemorrhage on 01/14/2024 likely  secondary to large vessel disease source given right M1 high-grade stenosis. Vascular risk factors include A-fib on Eliquis , HLD, HTN and advanced age.      PLAN:  Right BG stroke:  Residual deficit: ***.  Continue aspirin  81mg  daily and Eilquis 2.5mg  twice daily and atorvastatin  (Lipitor) for secondary stroke prevention managed/prescribed by PCP.   Discussed secondary stroke prevention measures and importance of close PCP follow up for aggressive stroke risk factor management including BP goal<130/90, HLD with LDL goal<70 and DM with A1c.<7 .  Stroke labs 01/2024: LDL 128, A1c 5.0 I have gone over the pathophysiology of stroke, warning signs and symptoms, risk factors and their management in some detail with instructions to go to the closest emergency room for symptoms of concern.     Follow up in *** or call earlier if needed   CC:  [facility] provider: Dr. Rosemarie PCP: Jerome Heron Ruth, PA-C    I personally spent a total of *** minutes in the care of the patient today including {Time Based Coding:[id number]}.    Harlene Bogaert, AGNP-BC  [facility] [address] [place], [place] [address]  Phone [phone] Fax [phone] Note: This document was prepared with digital dictation and possible smart phrase technology. Any transcriptional errors that result from this process are unintentional.

## 2024-02-10 NOTE — Telephone Encounter (Signed)
 MY[facility] conf

## 2024-02-11 ENCOUNTER — Encounter: Payer: Self-pay | Admitting: Adult Health

## 2024-02-11 ENCOUNTER — Ambulatory Visit: Admitting: Adult Health

## 2024-02-11 VITALS — BP 139/72 | HR 67 | Ht 65.0 in | Wt 112.0 lb

## 2024-02-11 DIAGNOSIS — I69354 Hemiplegia and hemiparesis following cerebral infarction affecting left non-dominant side: Secondary | ICD-10-CM | POA: Diagnosis not present

## 2024-02-11 DIAGNOSIS — I6381 Other cerebral infarction due to occlusion or stenosis of small artery: Secondary | ICD-10-CM

## 2024-02-11 DIAGNOSIS — I480 Paroxysmal atrial fibrillation: Secondary | ICD-10-CM | POA: Diagnosis not present

## 2024-02-11 NOTE — Progress Notes (Signed)
 I agree with the above plan

## 2024-02-11 NOTE — Patient Instructions (Addendum)
 Continue working with therapies for hopeful ongoing recovery - keep up the good work!   Continue Eliquis , aspirin   and atorvastatin  for secondary stroke prevention  Continue routine follow-up with cardiology as scheduled  Continue to follow up with PCP regarding blood pressure and cholesterol management  Maintain strict control of hypertension with blood pressure goal below 130/90 but important to avoid hypotension to ensure adequate perfusion and cholesterol with LDL cholesterol (bad cholesterol) goal below 70 mg/dL.   Signs of a Stroke? Follow the BEFAST method:  Balance Watch for a sudden loss of balance, trouble with coordination or vertigo Eyes Is there a sudden loss of vision in one or both eyes? Or double vision?  Face: Ask the person to smile. Does one side of the face droop or is it numb?  Arms: Ask the person to raise both arms. Does one arm drift downward? Is there weakness or numbness of a leg? Speech: Ask the person to repeat a simple phrase. Does the speech sound slurred/strange? Is the person confused ? Time: If you observe any of these signs, call 911.    As you are recovering well and closely being followed by rehab provider and cardiology, you can follow up here as needed but please call with any questions or concerns in the future     Thank you for coming to see us  at [facility]. I hope we have been able to provide you high quality care today.  You may receive a patient satisfaction survey over the next few weeks. We would appreciate your feedback and comments so that we may continue to improve ourselves and the health of our patients.    Stroke Prevention Some medical conditions and lifestyle choices can lead to a higher risk for a stroke. You can help to prevent a stroke by eating healthy foods and exercising. It also helps to not smoke and to manage any health problems you may have. How can this condition affect me? A stroke is an emergency.  It should be treated right away. A stroke can lead to brain damage or threaten your life. There is a better chance of surviving and getting better after a stroke if you get medical help right away. What can increase my risk? The following medical conditions may increase your risk of a stroke: Diseases of the heart and blood vessels (cardiovascular disease). High blood pressure (hypertension). Diabetes. High cholesterol. Sickle cell disease. Problems with blood clotting. Being very overweight. Sleeping problems (obstructivesleep apnea). Other risk factors include: Being older than age 68. A history of blood clots, stroke, or mini-stroke (TIA). Race, ethnic background, or a family history of stroke. Smoking or using tobacco products. Taking birth control pills, especially if you smoke. Heavy alcohol and drug use. Not being active. What actions can I take to prevent this? Manage your health conditions High cholesterol. Eat a healthy diet. If this is not enough to manage your cholesterol, you may need to take medicines. Take medicines as told by your doctor. High blood pressure. Try to keep your blood pressure below 130/80. If your blood pressure cannot be managed through a healthy diet and regular exercise, you may need to take medicines. Take medicines as told by your doctor. Ask your doctor if you should check your blood pressure at home. Have your blood pressure checked every year. Diabetes. Eat a healthy diet and get regular exercise. If your blood sugar (glucose) cannot be managed through diet and exercise, you may need to take medicines.  Take medicines as told by your doctor. Talk to your doctor about getting checked for sleeping problems. Signs of a problem can include: Snoring a lot. Feeling very tired. Make sure that you manage any other conditions you have. Nutrition  Follow instructions from your doctor about what to eat or drink. You may be told to: Eat and drink  fewer calories each day. Limit how much salt (sodium) you use to 1,500 milligrams (mg) each day. Use only healthy fats for cooking, such as olive oil, canola oil, and sunflower oil. Eat healthy foods. To do this: Choose foods that are high in fiber. These include whole grains, and fresh fruits and vegetables. Eat at least 5 servings of fruits and vegetables a day. Try to fill one-half of your plate with fruits and vegetables at each meal. Choose low-fat (lean) proteins. These include low-fat cuts of meat, chicken without skin, fish, tofu, beans, and nuts. Eat low-fat dairy products. Avoid foods that: Are high in salt. Have saturated fat. Have trans fat. Have cholesterol. Are processed or pre-made. Count how many carbohydrates you eat and drink each day. Lifestyle If you drink alcohol: Limit how much you have to: 0-1 drink a day for women who are not pregnant. 0-2 drinks a day for men. Know how much alcohol is in your drink. In the [place] one drink equals one 12 oz bottle of beer ( ), one 5 oz glass of wine ( ), or one 1 oz glass of hard liquor (44mL). Do not smoke or use any products that have nicotine or tobacco. If you need help quitting, ask your doctor. Avoid secondhand smoke. Do not use drugs. Activity  Try to stay at a healthy weight. Get at least 30 minutes of exercise on most days, such as: Fast walking. Biking. Swimming. Medicines Take over-the-counter and prescription medicines only as told by your doctor. Avoid taking birth control pills. Talk to your doctor about the risks of taking birth control pills if: You are over 74 years old. You smoke. You get very bad headaches. You have had a blood clot. Where to find more information American Stroke Association: www.strokeassociation.org Get help right away if: You or a loved one has any signs of a stroke. BE FAST is an easy way to remember the warning signs: B - Balance. Dizziness, sudden trouble walking, or  loss of balance. E - Eyes. Trouble seeing or a change in how you see. F - Face. Sudden weakness or loss of feeling of the face. The face or eyelid may droop on one side. A - Arms. Weakness or loss of feeling in an arm. This happens all of a sudden and most often on one side of the body. S - Speech. Sudden trouble speaking, slurred speech, or trouble understanding what people say. T - Time. Time to call emergency services. Write down what time symptoms started. You or a loved one has other signs of a stroke, such as: A sudden, very bad headache with no known cause. Feeling like you may vomit (nausea). Vomiting. A seizure. These symptoms may be an emergency. Get help right away. Call your local emergency services (911 in the [place] Do not wait to see if the symptoms will go away. Do not drive yourself to the hospital. Summary You can help to prevent a stroke by eating healthy, exercising, and not smoking. It also helps to manage any health problems you have. Do not smoke or use any products that contain nicotine or tobacco. Get help right away  if you or a loved one has any signs of a stroke. This information is not intended to replace advice given to you by your health care provider. Make sure you discuss any questions you have with your health care provider. Document Revised: 06/23/2022 Document Reviewed: 06/23/2022 Elsevier Patient Education  2024 [employer].

## 2024-02-17 ENCOUNTER — Encounter: Payer: Self-pay | Admitting: Cardiology

## 2024-02-17 ENCOUNTER — Ambulatory Visit: Attending: Cardiology | Admitting: Cardiology

## 2024-02-17 VITALS — BP 118/76 | HR 76 | Ht 65.0 in | Wt 113.0 lb

## 2024-02-17 DIAGNOSIS — I480 Paroxysmal atrial fibrillation: Secondary | ICD-10-CM

## 2024-02-17 DIAGNOSIS — I6523 Occlusion and stenosis of bilateral carotid arteries: Secondary | ICD-10-CM

## 2024-02-17 DIAGNOSIS — I10 Essential (primary) hypertension: Secondary | ICD-10-CM

## 2024-02-17 NOTE — Progress Notes (Unsigned)
 Cardiology Office Note:   Date:  02/19/2024  ID:  Stacy [name], DOB July 11, 1941, MRN [id number] PCP: Jerome Heron Ruth, PA-C  [facility] Providers Cardiologist:  Vinie JAYSON Maxcy, MD Electrophysiologist:  OLE ONEIDA HOLTS, MD    History of Present Illness:   Discussed the use of AI scribe software for clinical note transcription with the patient, who gave verbal consent to proceed.  History of Present Illness Stacy [name] is an 83 year old female with paroxysmal atrial fibrillation, carotid artery stenosis, hypertension, hypothyroidism, and recent cerebrovascular accident who presents for follow-up after her stroke and discussion of anticoagulation management.  She experienced a cerebrovascular accident on January 14, 2024, characterized by left-sided dysarthria and facial droop. An MRI revealed an infarct in the right thalamus with a small bleed. She was initially taken off Eliquis  due to the micro hemorrhage but was discharged on aspirin  and Eliquis  for secondary stroke prevention. She has had significant recovery since the stroke, with full use of her arms and upper leg strength, but persistent weakness in her lower left leg, affecting her ability to walk. She is undergoing physical therapy five days a week, which she finds exhausting but beneficial, aiming to progress from using a wheelchair to a walker. Prior to her stroke, patient extremely active.  She has a history of paroxysmal atrial fibrillation, with overall low burden suspected (by chart record) but ultimately unknown due to lack of symptoms. Despite limited documented occurrence, was on Eliquis  Today she denies palpitations or dizziness. Her blood pressure has been stable, and she monitors it regularly.  Since her stroke, she experiences swelling in her left leg, more than the right, which she attributes to being sedentary post-stroke. She has a history of using compression hose due to diltiazem -induced edema, which she  continues to take. Interestingly, her rehab medication list also notes Amlodipine?  She has been experiencing constipation, managed with laxatives, but prefers fiber supplementation. She has adjusted her laxative use based on bowel movement frequency to avoid excessive bowel movements.  She is concerned about potential relocation to assisted living due to insurance coverage issues, which may limit her access to physical therapy and affect her mobility progress. She is currently in a nursing center and has filed an appeal to extend her stay for continued rehabilitation.  Today patient denies chest pain, shortness of breath, lower extremity edema, fatigue, palpitations, melena, hematuria, hemoptysis, diaphoresis, weakness, presyncope, syncope, orthopnea, and PND.   Studies Reviewed:    01/14/24 TTE IMPRESSIONS     1. Left ventricular ejection fraction, by estimation, is 65 to 70%. The  left ventricle has normal function. The left ventricle has no regional  wall motion abnormalities. There is mild left ventricular hypertrophy.  Left ventricular diastolic function  could not be evaluated.   2. Right ventricular systolic function is normal. The right ventricular  size is normal.   3. Left atrial size was severely dilated.   4. The mitral valve is abnormal. Trivial mitral valve regurgitation. Mild  mitral stenosis. Severe mitral annular calcification.   5. The aortic valve is calcified. There is moderate calcification of the  aortic valve. There is moderate thickening of the aortic valve. Aortic  valve regurgitation is mild to moderate. Mild aortic valve stenosis.   Comparison(s): Prior images unable to be directly viewed, comparison made  by report only.   Conclusion(s)/Recommendation(s): EF unchanged, mild mitral stenosis, mild  aortic stenosis.   FINDINGS   Left Ventricle: Left ventricular ejection fraction, by estimation, is  65  to 70%. The left ventricle has normal function. The  left ventricle has no  regional wall motion abnormalities. The left ventricular internal cavity  size was normal in size. There is   mild left ventricular hypertrophy. Left ventricular diastolic function  could not be evaluated due to mitral annular calcification (moderate or  greater). Left ventricular diastolic function could not be evaluated.   Right Ventricle: The right ventricular size is normal. No increase in  right ventricular wall thickness. Right ventricular systolic function is  normal.   Left Atrium: Left atrial size was severely dilated.   Right Atrium: Right atrial size was normal in size.   Pericardium: There is no evidence of pericardial effusion.   Mitral Valve: The mitral valve is abnormal. Severe mitral annular  calcification. Trivial mitral valve regurgitation. Mild mitral valve  stenosis. MV peak gradient, 16.8 mmHg. The mean mitral valve gradient is  6.0 mmHg.   Tricuspid Valve: The tricuspid valve is normal in structure. Tricuspid  valve regurgitation is trivial. No evidence of tricuspid stenosis.   Aortic Valve: The aortic valve is calcified. There is moderate  calcification of the aortic valve. There is moderate thickening of the  aortic valve. Aortic valve regurgitation is mild to moderate. Mild aortic  stenosis is present. Aortic valve mean  gradient measures 17.0 mmHg. Aortic valve peak gradient measures 36.5  mmHg. Aortic valve area, by VTI measures 1.43 cm.   Pulmonic Valve: The pulmonic valve was not well visualized. Pulmonic valve  regurgitation is not visualized. No evidence of pulmonic stenosis.   Aorta: The aortic root is normal in size and structure.   Venous: The inferior vena cava was not well visualized.   IAS/Shunts: The atrial septum is grossly normal.     Risk Assessment/Calculations:    CHA2DS2-VASc Score = 4   This indicates a 4.8% annual risk of stroke. The patient's score is based upon: CHF History: 0 HTN History:  0 Diabetes History: 0 Stroke History: 0 Vascular Disease History: 1 Age Score: 2 Gender Score: 1   Physical Exam:   VS:  BP 118/76   Pulse 76   Ht 5' 5 (1.651 m)   Wt 113 lb (51.3 kg)   SpO2 96%   BMI 18.80 kg/m    Wt Readings from Last 3 Encounters:  02/17/24 113 lb (51.3 kg)  02/11/24 112 lb (50.8 kg)  01/25/24 115 lb 8 oz (52.4 kg)     Physical Exam Vitals reviewed.  Constitutional:      Appearance: Normal appearance.  HENT:     Head: Normocephalic.     Nose: Nose normal.  Eyes:     Pupils: Pupils are equal, round, and reactive to light.  Cardiovascular:     Rate and Rhythm: Normal rate and regular rhythm.     Pulses: Normal pulses.     Heart sounds: Normal heart sounds. No murmur heard.    No friction rub. No gallop.  Pulmonary:     Effort: Pulmonary effort is normal.     Breath sounds: Normal breath sounds.  Abdominal:     General: Abdomen is flat.  Musculoskeletal:     Right lower leg: No edema.     Left lower leg: Edema (mixed edema to mid shin) present.  Skin:    General: Skin is warm and dry.     Capillary Refill: Capillary refill takes less than 2 seconds.  Neurological:     General: No focal deficit present.  Mental Status: She is alert and oriented to person, place, and time.  Psychiatric:        Mood and Affect: Mood normal.        Behavior: Behavior normal.        Thought Content: Thought content normal.        Judgment: Judgment normal.      ASSESSMENT AND PLAN:    Assessment & Plan Acute cerebrovascular accident Recent acute cerebrovascular accident with left-sided dysarthria and facial droop. MRI revealed infarct in the right thalamus with a small bleed, possibly petechial hemorrhage. Recovery progressing well with significant improvement in arm strength and eye muscle function. Continued weakness in lower leg affecting mobility. - Continue Eliquis  2.5 mg twice daily for secondary stroke prevention. - Continue aspirin  81 mg daily  per neurology - Continue physical therapy to improve mobility and strength. - Monitor cholesterol levels and liver function tests.  Paroxysmal atrial fibrillation Paroxysmal atrial fibrillation without recent symptoms or documented recurrence. No palpitations or arrhythmia symptoms during recent admissions. Diltiazem  effectively controlling heart rate.  - Continue diltiazem  180 mg daily. - Continue Eliquis  2.5mg  BID (reduced dose with age >38 and weight <60kg). - Monitor for any recurrence of atrial fibrillation symptoms.  Hypertension Blood pressure well-controlled with diltiazem . Recent readings within normal range despite stressors. Amlodipine as noted on rehab facility medication list is redundant and could be contributing to peripheral edema. - Discontinue amlodipine. - Monitor blood pressure regularly to ensure it remains below 130/80 mmHg.  Mild carotid artery stenosis Previous carotid artery duplex in 2020 showed mild stenosis bilaterally. Inpatient CTA head and neck noted Cervical carotid atherosclerosis without a significant stenosis of the common carotid or internal carotid arteries. 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 Neurology did not recommend further imaging during recent follow up. - In setting of recent stroke, will reach out to neurology to confirm if repeat imaging is needed.  Peripheral edema Peripheral edema, particularly in the left leg, likely due to calcium  channel blockers and reduced mobility. Compression hose previously used with limited success. - Discontinue amlodipine to reduce edema. - Use compression stockings as tolerated, ensuring they are removed before dinner to prevent discomfort. - Elevate legs when possible to reduce swelling.  Hyperlipidemia S/p CVA, patient on Atorvastatin  40mg . Will need repeat lipid panel and LFTs. I have request from her rehab facility.  Mild aortic insufficiency Admission TTE with moderate calcification of the aortic  valve. There is moderate thickening of the aortic valve. Aortic valve regurgitation is mild to moderate. - Serial monitoring recommended.         Signed, Artist Pouch, PA-C

## 2024-02-17 NOTE — Patient Instructions (Signed)
 Medication Instructions:  Your physician has recommended you make the following change in your medication:   STOP Amlodipine  *If you need a refill on your cardiac medications before your next appointment, please call your pharmacy*  Lab Work: None ordered here but will ask the facility to draw a Lipid and LFT and fax over the results  If you have labs (blood work) drawn today and your tests are completely normal, you will receive your results only by: MyChart Message (if you have MyChart) OR A paper copy in the mail If you have any lab test that is abnormal or we need to change your treatment, we will call you to review the results.  Testing/Procedures: None ordered  Follow-Up: At [facility], you and your health needs are our priority.  As part of our continuing mission to provide you with exceptional heart care, our providers are all part of one team.  This team includes your primary Cardiologist (physician) and Advanced Practice Providers or APPs (Physician Assistants and Nurse Practitioners) who all work together to provide you with the care you need, when you need it.  Your next appointment:   6 month(s)  Provider:   Vinie JAYSON Maxcy, MD    We recommend signing up for the patient portal called MyChart.  Sign up information is provided on this After Visit Summary.  MyChart is used to connect with patients for Virtual Visits (Telemedicine).  Patients are able to view lab/test results, encounter notes, upcoming appointments, etc.  Non-urgent messages can be sent to your provider as well.   To learn more about what you can do with MyChart, go to [email or web address].   Other Instructions

## 2024-02-18 LAB — LIPID PANEL
Cholesterol: 158 (ref 0–200)
HDL: 71 — AB (ref 35–70)
LDL Cholesterol: 73
LDl/HDL Ratio: 2.2
Triglycerides: 49 (ref 40–160)

## 2024-02-18 LAB — COMPREHENSIVE METABOLIC PANEL WITH GFR
Albumin: 3.4 — AB (ref 3.5–5.0)
Globulin: 1.8

## 2024-02-18 LAB — HEPATIC FUNCTION PANEL
ALT: 16 U/L (ref 7–35)
AST: 14 (ref 13–35)
Alkaline Phosphatase: 67 (ref 25–125)
Bilirubin, Direct: 0.2 (ref 0.01–0.4)
Bilirubin, Total: 0.7

## 2024-02-22 DIAGNOSIS — R278 Other lack of coordination: Secondary | ICD-10-CM | POA: Diagnosis not present

## 2024-02-22 DIAGNOSIS — R2689 Other abnormalities of gait and mobility: Secondary | ICD-10-CM | POA: Diagnosis not present

## 2024-02-22 DIAGNOSIS — M6281 Muscle weakness (generalized): Secondary | ICD-10-CM | POA: Diagnosis not present

## 2024-02-23 DIAGNOSIS — M6281 Muscle weakness (generalized): Secondary | ICD-10-CM | POA: Diagnosis not present

## 2024-02-23 DIAGNOSIS — R278 Other lack of coordination: Secondary | ICD-10-CM | POA: Diagnosis not present

## 2024-02-23 DIAGNOSIS — R2689 Other abnormalities of gait and mobility: Secondary | ICD-10-CM | POA: Diagnosis not present

## 2024-02-24 DIAGNOSIS — M6281 Muscle weakness (generalized): Secondary | ICD-10-CM | POA: Diagnosis not present

## 2024-02-24 DIAGNOSIS — R2689 Other abnormalities of gait and mobility: Secondary | ICD-10-CM | POA: Diagnosis not present

## 2024-02-24 DIAGNOSIS — R278 Other lack of coordination: Secondary | ICD-10-CM | POA: Diagnosis not present

## 2024-02-25 ENCOUNTER — Non-Acute Institutional Stay (SKILLED_NURSING_FACILITY): Payer: Self-pay | Admitting: Nurse Practitioner

## 2024-02-25 ENCOUNTER — Encounter: Payer: Self-pay | Admitting: Nurse Practitioner

## 2024-02-25 DIAGNOSIS — Z8673 Personal history of transient ischemic attack (TIA), and cerebral infarction without residual deficits: Secondary | ICD-10-CM

## 2024-02-25 DIAGNOSIS — I10 Essential (primary) hypertension: Secondary | ICD-10-CM

## 2024-02-25 DIAGNOSIS — E039 Hypothyroidism, unspecified: Secondary | ICD-10-CM | POA: Diagnosis not present

## 2024-02-25 DIAGNOSIS — K5901 Slow transit constipation: Secondary | ICD-10-CM

## 2024-02-25 DIAGNOSIS — E785 Hyperlipidemia, unspecified: Secondary | ICD-10-CM

## 2024-02-25 DIAGNOSIS — F419 Anxiety disorder, unspecified: Secondary | ICD-10-CM | POA: Diagnosis not present

## 2024-02-25 DIAGNOSIS — I480 Paroxysmal atrial fibrillation: Secondary | ICD-10-CM | POA: Diagnosis not present

## 2024-02-25 DIAGNOSIS — E871 Hypo-osmolality and hyponatremia: Secondary | ICD-10-CM

## 2024-02-25 DIAGNOSIS — M6281 Muscle weakness (generalized): Secondary | ICD-10-CM | POA: Diagnosis not present

## 2024-02-25 DIAGNOSIS — R278 Other lack of coordination: Secondary | ICD-10-CM | POA: Diagnosis not present

## 2024-02-25 DIAGNOSIS — R2689 Other abnormalities of gait and mobility: Secondary | ICD-10-CM | POA: Diagnosis not present

## 2024-02-25 NOTE — Assessment & Plan Note (Signed)
 TSH 5.9, on Levothyroxine . Scheduled TSH 6 weeks.

## 2024-02-25 NOTE — Assessment & Plan Note (Signed)
 HLD/carotid artery stenosis, on Atorvastatin , LDL 73 02/18/24

## 2024-02-25 NOTE — Assessment & Plan Note (Signed)
 Loose Bp control, mild elevated Sbp in 140s, on Diltiazem , Amlodipine. Bun/creat 14/0.52 01/28/24

## 2024-02-25 NOTE — Assessment & Plan Note (Addendum)
 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 Na 131 01/28/24

## 2024-02-25 NOTE — Assessment & Plan Note (Signed)
 right thalamus MRI brain,  with left sided weakness with muscle strength 5/5 and resolved dysarthria  hospitalized 01/14/24-01/18/24, f/u stroke clinic/neurology. On ASA, Atrovastatin, Eliquis (initially held ASA/Eliquis  due to micro hemorrhage within the lesion and chronic in the posterior left frontal lobe)

## 2024-02-25 NOTE — Progress Notes (Unsigned)
 Location:  [facility] Room Number: N022-A Place of Service:  SNF (31) Provider:  Zackariah Vanderpol X, NP    Patient Care Team: Jerome Heron Jama DEVONNA as PCP - General (Physician Assistant) Cindie Ole DASEN, MD as PCP - Electrophysiology (Cardiology) Mona Vinie BROCKS, MD as PCP - Cardiology (Cardiology) Dann Candyce RAMAN, MD as Consulting Physician (Cardiology)  Extended Emergency Contact Information Primary Emergency Contact: Courtland Devere PERSONS, [place] [address] [place] Home Phone: [phone] Mobile Phone: [phone] Relation: Niece  Code Status:  Full Code Goals of care: Advanced Directive information    02/25/2024    9:11 AM  Advanced Directives  Does Patient Have a Medical Advance Directive? Yes  Type of Estate agent of [place];Living will  Does patient want to make changes to medical advance directive? No - Patient declined  Copy of Healthcare Power of Attorney in Chart? Yes - validated most recent copy scanned in chart (See row information)     Chief Complaint  Patient presents with  . Medical Management of Chronic Issues    Routine Visit, in addition needs to discuss shingles and tetanus vaccine and Medicare Annual Wellness Visit    HPI:  Pt is a 83 y.o. female seen today for medical management of chronic diseases.     CVA, right thalamus MRI brain,  with left sided weakness with muscle strength 5/5 and resolved dysarthria  hospitalized 01/14/24-01/18/24, f/u stroke clinic/neurology. On ASA, Atrovastatin, Eliquis (initially held ASA/Eliquis  due to micro hemorrhage within the lesion and chronic in the posterior left frontal lobe)              Hyponatremia, fluid restriction, combination SIADH(urine osmolality 500s) and dehydration. Na 131 01/28/24             GAD/insomnia, on Lorazepam              HTN, on Diltiazem , Amlodipine. Bun/creat 14/0.52 01/28/24             Afib, on Eliquis ,  Diltiazem              Chronic cystitis/urinary frequency.              Hypothyroidism, TSH 5.9, on Levothyroxine . Scheduled TSH 6 weeks.              HLD/carotid artery stenosis, on Atorvastatin , LDL 73 02/18/24             IBS/constipation, Bene fiber, Lactulose .                    Past Medical History:  Diagnosis Date  . Atrial fibrillation (HCC)    echo 12/23/10 EF= >55%, stress myoview 01/30/11 normal pattern of perfusion in all myocardial regions  . Carotid artery stenosis    Per [facility] new patient packet   . Chicken pox   . Colon polyp   . Colon polyp   . Dyslipidemia   . Heart murmur   . Hiatal hernia   . Hypertension   . Hypothyroidism   . Laryngopharyngeal reflux   . Osteopenia   . Scoliosis   . Seasonal allergies   . Thyroid  disease   . Vitamin D  deficiency    Past Surgical History:  Procedure Laterality Date  . BREAST CYST EXCISION Right 1988  . ESOPHAGEAL MANOMETRY N/A 08/29/2015   Procedure: ESOPHAGEAL MANOMETRY (EM);  Surgeon: Elspeth Deward Naval, MD;  Location: [facility];  Service: Gastroenterology;  Laterality: N/A;  . EYE SURGERY     Cataract eye surgery-right 06/2014, left 04/2014  . ORIF PATELLA Right 07/08/2022   Procedure: OPEN REDUCTION INTERNAL FIXATION (ORIF) PATELLA;  Surgeon: Josefina Chew, MD;  Location: [facility];  Service: Orthopedics;  Laterality: Right;  . TONSILLECTOMY     Childhood    Allergies  Allergen Reactions  . Amoxicillin Swelling  . Flonase [Fluticasone Propionate] Swelling    Swelling of throat per patient   . Sulfa Antibiotics Other (See Comments)    Upset stomach  . Sulfasalazine Other (See Comments)    Upset stomach    Outpatient Encounter Medications as of 02/25/2024  Medication Sig  . aspirin  EC 81 MG tablet Take 1 tablet (81 mg total) by mouth daily. Swallow whole.  . atorvastatin  (LIPITOR) 40 MG tablet Take 1 tablet (40 mg total) by mouth daily.  . Benzocaine-Menthol-Zinc Cl (ORAJEL 3X TOOTHACHE &  GUM) 20-0.26-0.15 % GEL Use as directed in the mouth or throat as needed (Place and dissolve 1 application buccally as needed for mouth pain may keep at bedside).  . Cholecalciferol  (VITAMIN D -3) 1000 UNITS CAPS Take 1,000 Units by mouth in the morning.  . diltiazem  (CARTIA  XT) 180 MG 24 hr capsule Take 1 capsule (180 mg total) by mouth at bedtime.  . ELIQUIS  2.5 MG TABS tablet Take 2.5 mg by mouth 2 (two) times daily.  . lactulose  (CHRONULAC ) 10 GM/15ML solution Take 30 mLs by mouth daily.  . levothyroxine  (SYNTHROID ) 125 MCG tablet TAKE 1 TABLET BY MOUTH DAILY BEFORE BREAKFAST.  . LORazepam  (ATIVAN ) 0.5 MG tablet Take 1 tablet (0.5 mg total) by mouth 2 (two) times daily as needed for anxiety.  . LORazepam  (ATIVAN ) 0.5 MG tablet Take 1 tablet by mouth as needed for anxiety.  . Multiple Vitamins-Minerals (MULTIVITAMIN GUMMIES ADULT PO) Take 2 Pieces by mouth every evening.  . Multiple Vitamins-Minerals (PRESERVISION AREDS PO) Take 1 tablet by mouth at bedtime.  . Probiotic CHEW Chew 1 each by mouth every evening.  . Simethicone (GAS-X PO) Take 1 tablet by mouth as needed.  . Wheat Dextrin (BENEFIBER PO) Take 0.5 Scoops by mouth as needed.   No facility-administered encounter medications on file as of 02/25/2024.    Review of Systems  Constitutional:  Negative for appetite change, fatigue and fever.  HENT:  Negative for congestion and trouble swallowing.   Eyes:  Negative for visual disturbance.  Respiratory:  Negative for cough and shortness of breath.   Cardiovascular:  Positive for leg swelling. Negative for chest pain and palpitations.  Gastrointestinal:  Positive for constipation. Negative for abdominal pain, nausea and vomiting.       Occasional, on diet.   Genitourinary:  Positive for frequency. Negative for difficulty urinating, dysuria and urgency.  Musculoskeletal:  Negative for back pain and gait problem.  Skin:  Negative for color change.  Neurological:  Positive for dizziness  and weakness. Negative for speech difficulty and headaches.  Psychiatric/Behavioral:  Positive for sleep disturbance. Negative for behavioral problems. The patient is nervous/anxious.     Immunization History  Administered Date(s) Administered  . Influenza, High Dose Seasonal PF 06/05/2015, 06/13/2016, 06/15/2017, 05/31/2018, 05/16/2019, 05/16/2020  . Influenza,inj,Quad PF,6+ Mos 05/19/2014  . Influenza,inj,quad, With Preservative 05/31/2018  . Moderna Covid-19 Fall Seasonal Vaccine 14yrs & older 05/20/2023  . Moderna SARS-COV2 Booster Vaccination 06/12/2020  . Moderna Sars-Covid-2 Vaccination 08/08/2019, 09/05/2019  . PNEUMOCOCCAL CONJUGATE-20 04/16/2023  . Tdap 08/04/2010  . Zoster Recombinant(Shingrix) 05/13/2011  Pertinent  Health Maintenance Due  Topic Date Due  . INFLUENZA VACCINE  03/04/2024  . DEXA SCAN  Discontinued      03/08/2020    9:13 AM 09/30/2020    3:25 PM 12/13/2020    3:25 PM 07/07/2022   11:36 AM 07/08/2022   10:43 AM  Fall Risk  Falls in the past year? 1  0    Was there an injury with Fall? 0  0    Fall Risk Category Calculator 1  0    Fall Risk Category (Retired) Low   Low     (RETIRED) Patient Fall Risk Level  Low fall risk  Low fall risk  Low fall risk  High fall risk      Data saved with a previous flowsheet row definition   Functional Status Survey:    Vitals:   02/25/24 0911  BP: (!) 146/88  Pulse: 68  Resp: 18  Temp: (!) 97.3 F (36.3 C)  SpO2: 91%  Weight: 116 lb 12.8 oz (53 kg)  Height: 5' 5 (1.651 m)   Body mass index is 19.44 kg/m. Physical Exam Vitals and nursing note reviewed.  Constitutional:      Appearance: Normal appearance.  HENT:     Head: Normocephalic and atraumatic.     Nose: Nose normal.     Mouth/Throat:     Mouth: Mucous membranes are moist.  Eyes:     Extraocular Movements: Extraocular movements intact.     Conjunctiva/sclera: Conjunctivae normal.     Pupils: Pupils are equal, round, and reactive to light.   Cardiovascular:     Rate and Rhythm: Normal rate. Rhythm irregular.     Heart sounds: Murmur heard.  Pulmonary:     Effort: Pulmonary effort is normal.     Breath sounds: Normal breath sounds. No stridor. No rales.  Abdominal:     General: Bowel sounds are normal.     Palpations: Abdomen is soft.     Tenderness: There is no abdominal tenderness. There is no right CVA tenderness, left CVA tenderness, guarding or rebound.  Genitourinary:    Comments: Declined to undress herself for examination.  Musculoskeletal:     Cervical back: Normal range of motion and neck supple.     Right lower leg: Edema present.     Left lower leg: Edema present.     Comments: Trace BLE, TED  Skin:    General: Skin is warm and dry.  Neurological:     General: No focal deficit present.     Mental Status: She is alert and oriented to person, place, and time. Mental status is at baseline.     Motor: Weakness present.     Coordination: Coordination normal.     Gait: Gait abnormal.     Deep Tendon Reflexes: Reflexes normal.     Comments: Left sided weakness with muscle strength 5/5-no change  Psychiatric:        Mood and Affect: Mood normal.        Behavior: Behavior normal.        Thought Content: Thought content normal.        Judgment: Judgment normal.     Comments: Anxious, racing speech     Labs reviewed: Recent Labs    01/02/24 2248 01/03/24 0214 01/14/24 1224 01/14/24 1226 01/16/24 1819 01/17/24 1129 01/18/24 0443  NA  --    < > 129*   < > 127* 127* 128*  K  --    < >  3.9   < > 3.3* 4.1 3.8  CL  --    < > 96*   < > 91* 93* 95*  CO2  --    < > 23   < > 25 25 25   GLUCOSE  --    < > 113*   < > 92 109* 88  BUN  --    < > 19   < > 16 11 17   CREATININE  --    < > 0.89   < > 0.70 0.56 0.63  CALCIUM   --    < > 9.2   < > 8.7* 8.8* 8.5*  MG 1.9  --  1.9  --  1.8 2.0  --   PHOS 3.1  --   --   --   --   --   --    < > = values in this interval not displayed.   Recent Labs    01/02/24 1208  01/14/24 1224 01/15/24 0543 02/18/24 0000  AST 24 26 34 14  ALT 20 20 22 16   ALKPHOS 64 56 68 67  BILITOT 0.9 0.9 1.1  --   PROT 6.2* 6.5 6.2*  --   ALBUMIN 3.8 3.9 3.6 3.4*   Recent Labs    01/14/24 1224 01/14/24 1226 01/15/24 0543 01/16/24 1111  WBC 5.4  --  5.9 6.7  NEUTROABS 3.9  --   --   --   HGB 13.6 13.6 13.8 14.4  HCT 38.8 40.0 39.1 40.5  MCV 93.9  --  93.5 92.0  PLT 153  --  165 154   Lab Results  Component Value Date   TSH 5.952 (H) 01/14/2024   Lab Results  Component Value Date   HGBA1C 5.0 01/15/2024   Lab Results  Component Value Date   CHOL 158 02/18/2024   HDL 71 (A) 02/18/2024   LDLCALC 73 02/18/2024   LDLDIRECT 148.1 08/17/2013   TRIG 49 02/18/2024   CHOLHDL 2.3 01/15/2024    Significant Diagnostic Results in last 30 days:  No results found.  Assessment/Plan There are no diagnoses linked to this encounter.   Family/ staff Communication: ***  Labs/tests ordered:  ***

## 2024-02-25 NOTE — Assessment & Plan Note (Signed)
 IBS/constipation, Bene fiber, Lactulose .

## 2024-02-25 NOTE — Assessment & Plan Note (Signed)
 on Lorazepam 

## 2024-02-25 NOTE — Assessment & Plan Note (Signed)
 Heart rate is in control, on Eliquis, Diltiazem

## 2024-02-26 ENCOUNTER — Telehealth: Payer: Self-pay | Admitting: Cardiology

## 2024-02-26 ENCOUNTER — Encounter: Payer: Self-pay | Admitting: Nurse Practitioner

## 2024-02-26 DIAGNOSIS — I6529 Occlusion and stenosis of unspecified carotid artery: Secondary | ICD-10-CM

## 2024-02-26 DIAGNOSIS — R278 Other lack of coordination: Secondary | ICD-10-CM | POA: Diagnosis not present

## 2024-02-26 DIAGNOSIS — R2689 Other abnormalities of gait and mobility: Secondary | ICD-10-CM | POA: Diagnosis not present

## 2024-02-26 DIAGNOSIS — M6281 Muscle weakness (generalized): Secondary | ICD-10-CM | POA: Diagnosis not present

## 2024-02-26 NOTE — Addendum Note (Signed)
 Addended by: MELIDA ROLIN HERO on: 02/26/2024 09:30 AM   Modules accepted: Orders

## 2024-02-26 NOTE — Telephone Encounter (Signed)
 Called pt to let her know a referral for Vascular surgery was placed. Phone rang several times, call was answered then disconnected. Unable to leave a message at this time.

## 2024-02-26 NOTE — Telephone Encounter (Signed)
   This patient was recently admitted with CVA. Previous carotid artery duplex in 2020 showed mild stenosis bilaterally. Inpatient CTA head and neck noted Cervical carotid atherosclerosis without a significant stenosis of the common carotid or internal carotid arteries. 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 Since my visit with patient, I have spoken with Dr. Mona. We are both concerned about the possibility of these findings being implicated in her recent stroke and recommend referral to vascular surgery for evaluation/recommendations.  Artist Pouch, PA-C

## 2024-02-29 DIAGNOSIS — R278 Other lack of coordination: Secondary | ICD-10-CM | POA: Diagnosis not present

## 2024-02-29 DIAGNOSIS — R2689 Other abnormalities of gait and mobility: Secondary | ICD-10-CM | POA: Diagnosis not present

## 2024-02-29 DIAGNOSIS — M6281 Muscle weakness (generalized): Secondary | ICD-10-CM | POA: Diagnosis not present

## 2024-03-01 DIAGNOSIS — R278 Other lack of coordination: Secondary | ICD-10-CM | POA: Diagnosis not present

## 2024-03-01 DIAGNOSIS — M6281 Muscle weakness (generalized): Secondary | ICD-10-CM | POA: Diagnosis not present

## 2024-03-01 DIAGNOSIS — H353111 Nonexudative age-related macular degeneration, right eye, early dry stage: Secondary | ICD-10-CM | POA: Diagnosis not present

## 2024-03-01 DIAGNOSIS — R2689 Other abnormalities of gait and mobility: Secondary | ICD-10-CM | POA: Diagnosis not present

## 2024-03-01 DIAGNOSIS — H353121 Nonexudative age-related macular degeneration, left eye, early dry stage: Secondary | ICD-10-CM | POA: Diagnosis not present

## 2024-03-01 DIAGNOSIS — H3322 Serous retinal detachment, left eye: Secondary | ICD-10-CM | POA: Diagnosis not present

## 2024-03-02 DIAGNOSIS — R2689 Other abnormalities of gait and mobility: Secondary | ICD-10-CM | POA: Diagnosis not present

## 2024-03-02 DIAGNOSIS — R278 Other lack of coordination: Secondary | ICD-10-CM | POA: Diagnosis not present

## 2024-03-02 DIAGNOSIS — M6281 Muscle weakness (generalized): Secondary | ICD-10-CM | POA: Diagnosis not present

## 2024-03-03 DIAGNOSIS — R278 Other lack of coordination: Secondary | ICD-10-CM | POA: Diagnosis not present

## 2024-03-03 DIAGNOSIS — R2689 Other abnormalities of gait and mobility: Secondary | ICD-10-CM | POA: Diagnosis not present

## 2024-03-03 DIAGNOSIS — M6281 Muscle weakness (generalized): Secondary | ICD-10-CM | POA: Diagnosis not present

## 2024-03-04 DIAGNOSIS — R278 Other lack of coordination: Secondary | ICD-10-CM | POA: Diagnosis not present

## 2024-03-04 DIAGNOSIS — R2689 Other abnormalities of gait and mobility: Secondary | ICD-10-CM | POA: Diagnosis not present

## 2024-03-04 DIAGNOSIS — M6281 Muscle weakness (generalized): Secondary | ICD-10-CM | POA: Diagnosis not present

## 2024-03-07 DIAGNOSIS — R278 Other lack of coordination: Secondary | ICD-10-CM | POA: Diagnosis not present

## 2024-03-07 DIAGNOSIS — R2689 Other abnormalities of gait and mobility: Secondary | ICD-10-CM | POA: Diagnosis not present

## 2024-03-07 DIAGNOSIS — M6281 Muscle weakness (generalized): Secondary | ICD-10-CM | POA: Diagnosis not present

## 2024-03-07 NOTE — Telephone Encounter (Signed)
Unable to reach pt or leave a message  

## 2024-03-08 DIAGNOSIS — R278 Other lack of coordination: Secondary | ICD-10-CM | POA: Diagnosis not present

## 2024-03-08 DIAGNOSIS — M6281 Muscle weakness (generalized): Secondary | ICD-10-CM | POA: Diagnosis not present

## 2024-03-08 DIAGNOSIS — R2689 Other abnormalities of gait and mobility: Secondary | ICD-10-CM | POA: Diagnosis not present

## 2024-03-09 DIAGNOSIS — R2689 Other abnormalities of gait and mobility: Secondary | ICD-10-CM | POA: Diagnosis not present

## 2024-03-09 DIAGNOSIS — M6281 Muscle weakness (generalized): Secondary | ICD-10-CM | POA: Diagnosis not present

## 2024-03-09 DIAGNOSIS — R278 Other lack of coordination: Secondary | ICD-10-CM | POA: Diagnosis not present

## 2024-03-10 ENCOUNTER — Encounter: Payer: Self-pay | Admitting: *Deleted

## 2024-03-10 DIAGNOSIS — R2689 Other abnormalities of gait and mobility: Secondary | ICD-10-CM | POA: Diagnosis not present

## 2024-03-10 DIAGNOSIS — M6281 Muscle weakness (generalized): Secondary | ICD-10-CM | POA: Diagnosis not present

## 2024-03-10 DIAGNOSIS — R278 Other lack of coordination: Secondary | ICD-10-CM | POA: Diagnosis not present

## 2024-03-10 NOTE — Telephone Encounter (Signed)
 Unable to reach pt or leave a message Letter out to patient to call to get appointment

## 2024-03-11 ENCOUNTER — Other Ambulatory Visit: Payer: Self-pay | Admitting: Internal Medicine

## 2024-03-11 DIAGNOSIS — E039 Hypothyroidism, unspecified: Secondary | ICD-10-CM

## 2024-03-11 DIAGNOSIS — R278 Other lack of coordination: Secondary | ICD-10-CM | POA: Diagnosis not present

## 2024-03-11 DIAGNOSIS — R2689 Other abnormalities of gait and mobility: Secondary | ICD-10-CM | POA: Diagnosis not present

## 2024-03-11 DIAGNOSIS — M6281 Muscle weakness (generalized): Secondary | ICD-10-CM | POA: Diagnosis not present

## 2024-03-14 DIAGNOSIS — R2689 Other abnormalities of gait and mobility: Secondary | ICD-10-CM | POA: Diagnosis not present

## 2024-03-14 DIAGNOSIS — R278 Other lack of coordination: Secondary | ICD-10-CM | POA: Diagnosis not present

## 2024-03-14 DIAGNOSIS — M6281 Muscle weakness (generalized): Secondary | ICD-10-CM | POA: Diagnosis not present

## 2024-03-15 DIAGNOSIS — R2689 Other abnormalities of gait and mobility: Secondary | ICD-10-CM | POA: Diagnosis not present

## 2024-03-15 DIAGNOSIS — R278 Other lack of coordination: Secondary | ICD-10-CM | POA: Diagnosis not present

## 2024-03-15 DIAGNOSIS — M6281 Muscle weakness (generalized): Secondary | ICD-10-CM | POA: Diagnosis not present

## 2024-03-16 DIAGNOSIS — M6281 Muscle weakness (generalized): Secondary | ICD-10-CM | POA: Diagnosis not present

## 2024-03-16 DIAGNOSIS — R2689 Other abnormalities of gait and mobility: Secondary | ICD-10-CM | POA: Diagnosis not present

## 2024-03-16 DIAGNOSIS — R278 Other lack of coordination: Secondary | ICD-10-CM | POA: Diagnosis not present

## 2024-03-16 NOTE — Telephone Encounter (Signed)
  Called and personally spoke with patient regarding recommended referral to [facility] due to abnormal findings on inpatient head and neck CTA. Patient remains in rehab at this time and expresses a desire to hold on referral until she returns home. Patient understands the potential stroke risks associated with the CTA findings. She states that she will call us  back for [facility] referral once she is back home.  Artist Pouch, PA-C

## 2024-03-17 DIAGNOSIS — R278 Other lack of coordination: Secondary | ICD-10-CM | POA: Diagnosis not present

## 2024-03-17 DIAGNOSIS — M6281 Muscle weakness (generalized): Secondary | ICD-10-CM | POA: Diagnosis not present

## 2024-03-17 DIAGNOSIS — R2689 Other abnormalities of gait and mobility: Secondary | ICD-10-CM | POA: Diagnosis not present

## 2024-03-18 DIAGNOSIS — R278 Other lack of coordination: Secondary | ICD-10-CM | POA: Diagnosis not present

## 2024-03-18 DIAGNOSIS — M6281 Muscle weakness (generalized): Secondary | ICD-10-CM | POA: Diagnosis not present

## 2024-03-18 DIAGNOSIS — R2689 Other abnormalities of gait and mobility: Secondary | ICD-10-CM | POA: Diagnosis not present

## 2024-03-21 DIAGNOSIS — R278 Other lack of coordination: Secondary | ICD-10-CM | POA: Diagnosis not present

## 2024-03-21 DIAGNOSIS — M6281 Muscle weakness (generalized): Secondary | ICD-10-CM | POA: Diagnosis not present

## 2024-03-21 DIAGNOSIS — R2689 Other abnormalities of gait and mobility: Secondary | ICD-10-CM | POA: Diagnosis not present

## 2024-03-22 DIAGNOSIS — R2689 Other abnormalities of gait and mobility: Secondary | ICD-10-CM | POA: Diagnosis not present

## 2024-03-22 DIAGNOSIS — R278 Other lack of coordination: Secondary | ICD-10-CM | POA: Diagnosis not present

## 2024-03-22 DIAGNOSIS — M6281 Muscle weakness (generalized): Secondary | ICD-10-CM | POA: Diagnosis not present

## 2024-03-23 DIAGNOSIS — R2689 Other abnormalities of gait and mobility: Secondary | ICD-10-CM | POA: Diagnosis not present

## 2024-03-23 DIAGNOSIS — R278 Other lack of coordination: Secondary | ICD-10-CM | POA: Diagnosis not present

## 2024-03-23 DIAGNOSIS — M6281 Muscle weakness (generalized): Secondary | ICD-10-CM | POA: Diagnosis not present

## 2024-03-24 ENCOUNTER — Non-Acute Institutional Stay (SKILLED_NURSING_FACILITY): Payer: Self-pay | Admitting: Nurse Practitioner

## 2024-03-24 ENCOUNTER — Encounter: Payer: Self-pay | Admitting: Nurse Practitioner

## 2024-03-24 DIAGNOSIS — R278 Other lack of coordination: Secondary | ICD-10-CM | POA: Diagnosis not present

## 2024-03-24 DIAGNOSIS — E039 Hypothyroidism, unspecified: Secondary | ICD-10-CM

## 2024-03-24 DIAGNOSIS — E785 Hyperlipidemia, unspecified: Secondary | ICD-10-CM | POA: Diagnosis not present

## 2024-03-24 DIAGNOSIS — M6281 Muscle weakness (generalized): Secondary | ICD-10-CM | POA: Diagnosis not present

## 2024-03-24 DIAGNOSIS — I480 Paroxysmal atrial fibrillation: Secondary | ICD-10-CM

## 2024-03-24 DIAGNOSIS — F419 Anxiety disorder, unspecified: Secondary | ICD-10-CM

## 2024-03-24 DIAGNOSIS — K5901 Slow transit constipation: Secondary | ICD-10-CM | POA: Diagnosis not present

## 2024-03-24 DIAGNOSIS — I10 Essential (primary) hypertension: Secondary | ICD-10-CM | POA: Diagnosis not present

## 2024-03-24 DIAGNOSIS — R531 Weakness: Secondary | ICD-10-CM | POA: Diagnosis not present

## 2024-03-24 DIAGNOSIS — E871 Hypo-osmolality and hyponatremia: Secondary | ICD-10-CM | POA: Diagnosis not present

## 2024-03-24 DIAGNOSIS — R2689 Other abnormalities of gait and mobility: Secondary | ICD-10-CM | POA: Diagnosis not present

## 2024-03-24 NOTE — Assessment & Plan Note (Signed)
 Hx of IBS, stable

## 2024-03-24 NOTE — Assessment & Plan Note (Signed)
 W/c for mobility, ambulates with walker with SBA, left leg weakness even if muscle strength is 5/5

## 2024-03-24 NOTE — Assessment & Plan Note (Signed)
 on Atorvastatin , LDL 73 02/18/24

## 2024-03-24 NOTE — Assessment & Plan Note (Signed)
 Heart rate is in control, on Eliquis, Diltiazem

## 2024-03-24 NOTE — Assessment & Plan Note (Signed)
 on Lorazepam , stable

## 2024-03-24 NOTE — Progress Notes (Unsigned)
 Location:   [facility]  Nursing Home Room Number: N022-A Place of Service:  SNF (31)  Provider: Larwance Hark NP  PCP: Jerome Heron Ruth, PA-C Patient Care Team: Jerome Heron Ruth DEVONNA as PCP - General (Physician Assistant) Cindie Ole DASEN, MD as PCP - Electrophysiology (Cardiology) Mona Vinie BROCKS, MD as PCP - Cardiology (Cardiology) Dann Candyce RAMAN, MD as Consulting Physician (Cardiology)  Extended Emergency Contact Information Primary Emergency Contact: Courtland Devere [place], [place] [address] [place] Home Phone: [phone] Mobile Phone: [phone] Relation: Niece  Code Status: DNR Goals of care:  Advanced Directive information    03/24/2024    9:06 AM  Advanced Directives  Does Patient Have a Medical Advance Directive? Yes  Type of Estate agent of [place];Living will  Does patient want to make changes to medical advance directive? No - Patient declined  Copy of Healthcare Power of Attorney in Chart? Yes - validated most recent copy scanned in chart (See row information)     Allergies  Allergen Reactions   Amoxicillin Swelling   Flonase [Fluticasone Propionate] Swelling    Swelling of throat per patient    Sulfa Antibiotics Other (See Comments)    Upset stomach   Sulfasalazine Other (See Comments)    Upset stomach    Chief Complaint  Patient presents with   Acute Visit    Discharge from SNF to [facility]    HPI:  83 y.o. female with medical history significant of CVA, Hyponatremia, GAD, HTN, Afib, urinary frequency, Hypothyroidism, HLD, and IBS was admitted to [facility] for therapy following her hospital stay    CVA, right thalamus MRI brain,  with left sided weakness and dysarthria  hospitalized 01/14/24-01/18/24, f/u stroke clinic/neurology. On ASA, Atrovastatin, Eliquis (initially held ASA/Eliquis  due to micro hemorrhage within the lesion and chronic in the posterior left frontal lobe)   The patient has  regained her physical strength, uses w/c for mobility, ambulates with walker with SBA, ADL function to the [facility] level. She is stable to transition to [facility] for continue care and therapy.              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 Na 131 01/28/24             GAD/insomnia, on Lorazepam              HTN, on Amlodipine, Diltiazem . Bun/creat 17/0.63 01/18/24             Afib, on Eliquis , Diltiazem              Chronic cystitis/urinary frequency.              Hypothyroidism, TSH 5.9, on Levothyroxine .              HLD/carotid artery stenosis, on Atorvastatin , LDL 73 02/18/24             IBS/constipation, managed.                  Past Medical History:  Diagnosis Date   Atrial fibrillation (HCC)    echo 12/23/10 EF= >55%, stress myoview 01/30/11 normal pattern of perfusion in all myocardial regions   Carotid artery stenosis    Per [facility] new patient packet    Chicken pox    Colon polyp    Colon polyp    Dyslipidemia    Heart murmur    Hiatal hernia  Hypertension    Hypothyroidism    Laryngopharyngeal reflux    Osteopenia    Scoliosis    Seasonal allergies    Thyroid  disease    Vitamin D  deficiency     Past Surgical History:  Procedure Laterality Date   BREAST CYST EXCISION Right 1988   ESOPHAGEAL MANOMETRY N/A 08/29/2015   Procedure: ESOPHAGEAL MANOMETRY (EM);  Surgeon: Elspeth Deward Naval, MD;  Location: [facility];  Service: Gastroenterology;  Laterality: N/A;   EYE SURGERY     Cataract eye surgery-right 06/2014, left 04/2014   ORIF PATELLA Right 07/08/2022   Procedure: OPEN REDUCTION INTERNAL FIXATION (ORIF) PATELLA;  Surgeon: Josefina Chew, MD;  Location: [facility];  Service: Orthopedics;  Laterality: Right;   TONSILLECTOMY     Childhood      reports that she has never smoked. She has never used smokeless tobacco. She reports that she does not currently use alcohol after a past usage of about  1.0 standard drink of alcohol per week. She reports that she does not use drugs. Social History   Socioeconomic History   Marital status: Single    Spouse name: Not on file   Number of children: Not on file   Years of education: Not on file   Highest education level: Not on file  Occupational History   Occupation: retired  Tobacco Use   Smoking status: Never   Smokeless tobacco: Never  Substance and Sexual Activity   Alcohol use: Not Currently    Alcohol/week: 1.0 standard drink of alcohol    Types: 1 Glasses of wine per week    Comment: 1 glass of wine 3x a week   Drug use: No   Sexual activity: Not on file  Other Topics Concern   Not on file  Social History Narrative   Work or School: Clinical research associate - poetry      Home Situation: lives alone      Spiritual Beliefs: Jewish      Lifestyle: [facility]; diet healthy      Per [facility] New Patient Packet:      Diet: Heart healthy, low-fat      Caffeine: 1 cup of tea daily       Married, if yes what year: Single      Do you live in a house, apartment, assisted living, condo, trailer, ect: Apartment, 1 person, 7 stories       Pets: No      Current/Past profession: PHD, Child psychotherapist      Exercise: Yes, Treadmill, weights, and walking          Living Will: Yes   DNR: Yes   POA/HPOA: Yes      Functional Status:   Do you have difficulty bathing or dressing yourself? No   Do you have difficulty preparing food or eating? No   Do you have difficulty managing your medications? No   Do you have difficulty managing your finances? No   Do you have difficulty affording your medications? No         Social Drivers of Corporate investment banker Strain: Not on file  Food Insecurity: No Food Insecurity (01/14/2024)   Hunger Vital Sign    Worried About Running Out of Food in the Last Year: Never true    Ran Out of Food in the Last Year: Never true  Transportation Needs: No Transportation Needs (01/14/2024)   PRAPARE  - Transportation    Lack  of Transportation (Medical): No    Lack of Transportation (Non-Medical): No  Physical Activity: Not on file  Stress: Not on file  Social Connections: Unknown (01/14/2024)   Social Connection and Isolation Panel    Frequency of Communication with Friends and Family: More than three times a week    Frequency of Social Gatherings with Friends and Family: Three times a week    Attends Religious Services: 1 to 4 times per year    Active Member of Clubs or Organizations: Yes    Attends Banker Meetings: More than 4 times per year    Marital Status: Not on file  Intimate Partner Violence: Not At Risk (01/14/2024)   Humiliation, Afraid, Rape, and Kick questionnaire    Fear of Current or Ex-Partner: No    Emotionally Abused: No    Physically Abused: No    Sexually Abused: No   Functional Status Survey:    Allergies  Allergen Reactions   Amoxicillin Swelling   Flonase [Fluticasone Propionate] Swelling    Swelling of throat per patient    Sulfa Antibiotics Other (See Comments)    Upset stomach   Sulfasalazine Other (See Comments)    Upset stomach    Pertinent  Health Maintenance Due  Topic Date Due   INFLUENZA VACCINE  04/04/2024 (Originally 03/04/2024)   DEXA SCAN  Discontinued    Medications: Allergies as of 03/24/2024       Reactions   Amoxicillin Swelling   Flonase [fluticasone Propionate] Swelling   Swelling of throat per patient   Sulfa Antibiotics Other (See Comments)   Upset stomach   Sulfasalazine Other (See Comments)   Upset stomach        Medication List        Accurate as of March 24, 2024 12:33 PM. If you have any questions, ask your nurse or doctor.          aspirin  EC 81 MG tablet Take 1 tablet (81 mg total) by mouth daily. Swallow whole.   atorvastatin  40 MG tablet Commonly known as: LIPITOR Take 1 tablet (40 mg total) by mouth daily.   BENEFIBER PO Take 0.5 Scoops by mouth as needed.   diltiazem  180 MG  24 hr capsule Commonly known as: Cartia  XT Take 1 capsule (180 mg total) by mouth at bedtime.   Eliquis  2.5 MG Tabs tablet Generic drug: apixaban  Take 2.5 mg by mouth 2 (two) times daily.   GAS-X PO Take 1 tablet by mouth as needed.   lactulose  10 GM/15ML solution Commonly known as: CHRONULAC  Take 30 mLs by mouth daily.   levothyroxine  125 MCG tablet Commonly known as: SYNTHROID  TAKE 1 TABLET BY MOUTH DAILY BEFORE BREAKFAST.   LORazepam  0.5 MG tablet Commonly known as: ATIVAN  Take 1 tablet by mouth as needed for anxiety.   LORazepam  0.5 MG tablet Commonly known as: ATIVAN  Take 1 tablet (0.5 mg total) by mouth 2 (two) times daily as needed for anxiety.   Orajel 3X Toothache & Gum 20-0.26-0.15 % Gel Generic drug: Benzocaine-Menthol-Zinc Cl Use as directed in the mouth or throat as needed (Place and dissolve 1 application buccally as needed for mouth pain may keep at bedside).   PRESERVISION AREDS PO Take 1 tablet by mouth at bedtime.   MULTIVITAMIN GUMMIES ADULT PO Take 2 Pieces by mouth every evening.   Probiotic Chew Chew 1 each by mouth every evening.   Vitamin D -3 25 MCG (1000 UT) Caps Take 1,000 Units by mouth in the morning.  Review of Systems  Constitutional:  Negative for appetite change, fatigue and fever.  HENT:  Negative for congestion and trouble swallowing.   Eyes:  Negative for visual disturbance.  Respiratory:  Negative for cough and shortness of breath.   Cardiovascular:  Negative for chest pain, palpitations and leg swelling.  Gastrointestinal:  Negative for abdominal pain, constipation, nausea and vomiting.       Occasional, on diet.   Genitourinary:  Positive for frequency. Negative for difficulty urinating, dysuria and urgency.  Musculoskeletal:  Negative for back pain and gait problem.  Skin:  Negative for color change.  Neurological:  Positive for weakness. Negative for dizziness, speech difficulty and headaches.   Psychiatric/Behavioral:  Positive for sleep disturbance. Negative for behavioral problems. The patient is nervous/anxious.     Vitals:   03/24/24 0912  BP: (!) 150/86  Pulse: 73  Weight: 118 lb 6.4 oz (53.7 kg)  Height: 5' 5 (1.651 m)   Body mass index is 19.7 kg/m. Physical Exam Vitals and nursing note reviewed.  Constitutional:      Appearance: Normal appearance.  HENT:     Head: Normocephalic and atraumatic.     Nose: Nose normal.     Mouth/Throat:     Mouth: Mucous membranes are moist.  Eyes:     Extraocular Movements: Extraocular movements intact.     Conjunctiva/sclera: Conjunctivae normal.     Pupils: Pupils are equal, round, and reactive to light.  Cardiovascular:     Rate and Rhythm: Normal rate. Rhythm irregular.     Heart sounds: Murmur heard.  Pulmonary:     Effort: Pulmonary effort is normal.     Breath sounds: No rales.  Abdominal:     General: Bowel sounds are normal.     Palpations: Abdomen is soft.     Tenderness: There is no abdominal tenderness.  Musculoskeletal:     Cervical back: Normal range of motion and neck supple.     Right lower leg: No edema.     Left lower leg: No edema.  Skin:    General: Skin is warm and dry.  Neurological:     General: No focal deficit present.     Mental Status: She is alert and oriented to person, place, and time. Mental status is at baseline.     Motor: Weakness present.     Coordination: Coordination normal.     Gait: Gait abnormal.     Deep Tendon Reflexes: Reflexes normal.     Comments: Left sided weakness with muscle strength 5/5-no change  Psychiatric:        Mood and Affect: Mood normal.        Behavior: Behavior normal.        Thought Content: Thought content normal.        Judgment: Judgment normal.     Comments: Anxious     Labs reviewed: Basic Metabolic Panel: Recent Labs    01/02/24 2248 01/03/24 0214 01/14/24 1224 01/14/24 1226 01/16/24 1819 01/17/24 1129 01/18/24 0443  NA  --    <  > 129*   < > 127* 127* 128*  K  --    < > 3.9   < > 3.3* 4.1 3.8  CL  --    < > 96*   < > 91* 93* 95*  CO2  --    < > 23   < > 25 25 25   GLUCOSE  --    < > 113*   < >  92 109* 88  BUN  --    < > 19   < > 16 11 17   CREATININE  --    < > 0.89   < > 0.70 0.56 0.63  CALCIUM   --    < > 9.2   < > 8.7* 8.8* 8.5*  MG 1.9  --  1.9  --  1.8 2.0  --   PHOS 3.1  --   --   --   --   --   --    < > = values in this interval not displayed.   Liver Function Tests: Recent Labs    01/02/24 1208 01/14/24 1224 01/15/24 0543 02/18/24 0000  AST 24 26 34 14  ALT 20 20 22 16   ALKPHOS 64 56 68 67  BILITOT 0.9 0.9 1.1  --   PROT 6.2* 6.5 6.2*  --   ALBUMIN 3.8 3.9 3.6 3.4*   No results for input(s): LIPASE, AMYLASE in the last 8760 hours. No results for input(s): AMMONIA in the last 8760 hours. CBC: Recent Labs    01/14/24 1224 01/14/24 1226 01/15/24 0543 01/16/24 1111  WBC 5.4  --  5.9 6.7  NEUTROABS 3.9  --   --   --   HGB 13.6 13.6 13.8 14.4  HCT 38.8 40.0 39.1 40.5  MCV 93.9  --  93.5 92.0  PLT 153  --  165 154   Cardiac Enzymes: No results for input(s): CKTOTAL, CKMB, CKMBINDEX, TROPONINI in the last 8760 hours. BNP: Invalid input(s): POCBNP CBG: Recent Labs    01/02/24 1241 01/14/24 1220  GLUCAP 110* 120*    Procedures and Imaging Studies During Stay: No results found.  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 Na 131 01/28/24 Update CMP/eGFR, CBC/diff, TSH  Anxiety on Lorazepam , stable  Hypertension Blood pressure is controlled, continue Diltiazem , Amlodipine.   Atrial fibrillation (HCC) Heart rate is in control,  on Eliquis , Diltiazem   Hypothyroidism  TSH 5.9, on Levothyroxine .  Repeat TSH  Dyslipidemia on Atorvastatin , LDL 73 02/18/24   Patient is  being discharged with the following home health services:    Patient is being discharged with the following durable medical equipment:    Patient has been advised to f/u with their PCP in 1-2 weeks to bring them up to date on their rehab stay.  Social services at facility was responsible for arranging this appointment.  Pt was provided with a 30 day supply of prescriptions for medications and refills must be obtained from their PCP.  For controlled substances, a more limited supply may be provided adequate until PCP appointment only.  Future labs/tests needed:  CBC/diff, CMP/eGFR, TSH

## 2024-03-24 NOTE — Assessment & Plan Note (Signed)
 TSH 5.9, on Levothyroxine .  Repeat TSH

## 2024-03-24 NOTE — Assessment & Plan Note (Signed)
 Blood pressure is controlled, continue Diltiazem , Amlodipine.

## 2024-03-24 NOTE — Assessment & Plan Note (Addendum)
 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 Na 131 01/28/24 Update CMP/eGFR, CBC/diff, TSH

## 2024-03-25 ENCOUNTER — Encounter: Payer: Self-pay | Admitting: Nurse Practitioner

## 2024-03-25 DIAGNOSIS — R2689 Other abnormalities of gait and mobility: Secondary | ICD-10-CM | POA: Diagnosis not present

## 2024-03-25 DIAGNOSIS — R278 Other lack of coordination: Secondary | ICD-10-CM | POA: Diagnosis not present

## 2024-03-25 DIAGNOSIS — M6281 Muscle weakness (generalized): Secondary | ICD-10-CM | POA: Diagnosis not present

## 2024-03-28 DIAGNOSIS — M6281 Muscle weakness (generalized): Secondary | ICD-10-CM | POA: Diagnosis not present

## 2024-03-28 DIAGNOSIS — R2689 Other abnormalities of gait and mobility: Secondary | ICD-10-CM | POA: Diagnosis not present

## 2024-03-28 DIAGNOSIS — R278 Other lack of coordination: Secondary | ICD-10-CM | POA: Diagnosis not present

## 2024-03-29 DIAGNOSIS — M6281 Muscle weakness (generalized): Secondary | ICD-10-CM | POA: Diagnosis not present

## 2024-03-29 DIAGNOSIS — I10 Essential (primary) hypertension: Secondary | ICD-10-CM | POA: Diagnosis not present

## 2024-03-29 DIAGNOSIS — R278 Other lack of coordination: Secondary | ICD-10-CM | POA: Diagnosis not present

## 2024-03-29 DIAGNOSIS — R2689 Other abnormalities of gait and mobility: Secondary | ICD-10-CM | POA: Diagnosis not present

## 2024-03-29 DIAGNOSIS — E039 Hypothyroidism, unspecified: Secondary | ICD-10-CM | POA: Diagnosis not present

## 2024-03-30 DIAGNOSIS — R2689 Other abnormalities of gait and mobility: Secondary | ICD-10-CM | POA: Diagnosis not present

## 2024-03-30 DIAGNOSIS — R278 Other lack of coordination: Secondary | ICD-10-CM | POA: Diagnosis not present

## 2024-03-30 DIAGNOSIS — M6281 Muscle weakness (generalized): Secondary | ICD-10-CM | POA: Diagnosis not present

## 2024-03-31 DIAGNOSIS — M6281 Muscle weakness (generalized): Secondary | ICD-10-CM | POA: Diagnosis not present

## 2024-03-31 DIAGNOSIS — R2689 Other abnormalities of gait and mobility: Secondary | ICD-10-CM | POA: Diagnosis not present

## 2024-03-31 DIAGNOSIS — R278 Other lack of coordination: Secondary | ICD-10-CM | POA: Diagnosis not present

## 2024-04-05 DIAGNOSIS — R2689 Other abnormalities of gait and mobility: Secondary | ICD-10-CM | POA: Diagnosis not present

## 2024-04-05 DIAGNOSIS — R278 Other lack of coordination: Secondary | ICD-10-CM | POA: Diagnosis not present

## 2024-04-05 DIAGNOSIS — R41841 Cognitive communication deficit: Secondary | ICD-10-CM | POA: Diagnosis not present

## 2024-04-07 ENCOUNTER — Encounter: Payer: Self-pay | Admitting: Nurse Practitioner

## 2024-04-07 ENCOUNTER — Non-Acute Institutional Stay: Payer: Self-pay | Admitting: Nurse Practitioner

## 2024-04-07 DIAGNOSIS — R278 Other lack of coordination: Secondary | ICD-10-CM | POA: Diagnosis not present

## 2024-04-07 DIAGNOSIS — Z0000 Encounter for general adult medical examination without abnormal findings: Secondary | ICD-10-CM | POA: Diagnosis not present

## 2024-04-07 DIAGNOSIS — R2689 Other abnormalities of gait and mobility: Secondary | ICD-10-CM | POA: Diagnosis not present

## 2024-04-07 DIAGNOSIS — R41841 Cognitive communication deficit: Secondary | ICD-10-CM | POA: Diagnosis not present

## 2024-04-07 NOTE — Progress Notes (Signed)
 Subjective:   Stacy [name] is a 83 y.o. female who presents for Medicare Annual (Subsequent) preventive examination [facility]  Visit Complete: In person  Patient Medicare AWV questionnaire was completed by the patient on 04/07/24; I have confirmed that all information answered by patient is correct and no changes since this date.  Cardiac Risk Factors include: advanced age (>50men, >42 women);dyslipidemia;hypertension     Objective:    Today's Vitals   04/07/24 1027 04/07/24 1029  BP: (S) (!) 162/74 136/72  Pulse: 74   Resp: 18   Temp: (!) 97.3 F (36.3 C)   SpO2: 95%   Weight: 113 lb 12.8 oz (51.6 kg)   Height: 5' 5 (1.651 m)    Body mass index is 18.94 kg/m.     03/24/2024    9:06 AM 02/25/2024    9:11 AM 01/21/2024   11:08 AM 01/14/2024    5:00 PM 01/02/2024    9:36 PM 02/21/2023    4:30 PM 07/08/2022   10:33 AM  Advanced Directives  Does Patient Have a Medical Advance Directive? Yes Yes Yes Yes Yes No Yes  Type of Estate agent of [place];Living will Healthcare Power of [place];Living will Healthcare Power of [place];Living will Living will;Out of facility DNR (pink MOST or yellow form) Living will  Healthcare Power of [place];Living will  Does patient want to make changes to medical advance directive? No - Patient declined No - Patient declined No - Patient declined No - Patient declined No - Patient declined  No - Patient declined  Copy of Healthcare Power of Attorney in Chart? Yes - validated most recent copy scanned in chart (See row information) Yes - validated most recent copy scanned in chart (See row information) Yes - validated most recent copy scanned in chart (See row information)    No - copy requested  Would patient like information on creating a medical advance directive?       No - Patient declined    Current Medications (verified) Outpatient Encounter Medications as of 04/07/2024  Medication Sig   aspirin  EC 81 MG tablet Take 1  tablet (81 mg total) by mouth daily. Swallow whole.   atorvastatin  (LIPITOR) 40 MG tablet Take 1 tablet (40 mg total) by mouth daily.   Benzocaine-Menthol-Zinc Cl (ORAJEL 3X TOOTHACHE & GUM) 20-0.26-0.15 % GEL Use as directed in the mouth or throat as needed (Place and dissolve 1 application buccally as needed for mouth pain may keep at bedside).   Cholecalciferol  (VITAMIN D -3) 1000 UNITS CAPS Take 1,000 Units by mouth in the morning.   Cranberry 250-30 MG TABS Take 2 tablets by mouth every evening.   diltiazem  (CARTIA  XT) 180 MG 24 hr capsule Take 1 capsule (180 mg total) by mouth at bedtime.   ELIQUIS  2.5 MG TABS tablet Take 2.5 mg by mouth 2 (two) times daily.   lactulose  (CHRONULAC ) 10 GM/15ML solution Take 30 mLs by mouth daily.   levothyroxine  (SYNTHROID ) 125 MCG tablet TAKE 1 TABLET BY MOUTH DAILY BEFORE BREAKFAST.   LORazepam  (ATIVAN ) 0.5 MG tablet Take 1 tablet by mouth as needed for anxiety.   LORazepam  (ATIVAN ) 0.5 MG tablet Take 0.5 mg by mouth at bedtime.   Multiple Vitamins-Minerals (MULTIVITAMIN GUMMIES ADULT PO) Take 2 Pieces by mouth every evening.   Multiple Vitamins-Minerals (PRESERVISION AREDS PO) Take 1 tablet by mouth at bedtime.   Probiotic CHEW Chew 1 each by mouth every evening.   Simethicone (GAS-X PO) Take 1 tablet by mouth  as needed.   Wheat Dextrin (BENEFIBER PO) Take 0.5 Scoops by mouth as needed.   [DISCONTINUED] LORazepam  (ATIVAN ) 0.5 MG tablet Take 1 tablet (0.5 mg total) by mouth 2 (two) times daily as needed for anxiety. (Patient not taking: Reported on 04/07/2024)   No facility-administered encounter medications on file as of 04/07/2024.    Allergies (verified) Amoxicillin, Flonase [fluticasone propionate], Sulfa antibiotics, and Sulfasalazine   History: Past Medical History:  Diagnosis Date   Atrial fibrillation (HCC)    echo 12/23/10 EF= >55%, stress myoview 01/30/11 normal pattern of perfusion in all myocardial regions   Carotid artery stenosis    Per  [facility] new patient packet    Chicken pox    Colon polyp    Colon polyp    Dyslipidemia    Heart murmur    Hiatal hernia    Hypertension    Hypothyroidism    Laryngopharyngeal reflux    Osteopenia    Scoliosis    Seasonal allergies    Thyroid  disease    Vitamin D  deficiency    Past Surgical History:  Procedure Laterality Date   BREAST CYST EXCISION Right 1988   ESOPHAGEAL MANOMETRY N/A 08/29/2015   Procedure: ESOPHAGEAL MANOMETRY (EM);  Surgeon: Elspeth Deward Naval, MD;  Location: [facility];  Service: Gastroenterology;  Laterality: N/A;   EYE SURGERY     Cataract eye surgery-right 06/2014, left 04/2014   ORIF PATELLA Right 07/08/2022   Procedure: OPEN REDUCTION INTERNAL FIXATION (ORIF) PATELLA;  Surgeon: Josefina Chew, MD;  Location: [facility];  Service: Orthopedics;  Laterality: Right;   TONSILLECTOMY     Childhood   Family History  Problem Relation Age of Onset   CVA Mother    Hyperlipidemia Mother    Hypertension Mother    Anuerysm Father        brain   Stroke Maternal Grandmother    Hypertension Maternal Grandmother    Hypertension Sister    Scoliosis Sister    Colon cancer Neg Hx    Heart attack Neg Hx    Breast cancer Neg Hx    Social History   Socioeconomic History   Marital status: Single    Spouse name: Not on file   Number of children: Not on file   Years of education: Not on file   Highest education level: Not on file  Occupational History   Occupation: retired  Tobacco Use   Smoking status: Never   Smokeless tobacco: Never  Substance and Sexual Activity   Alcohol use: Not Currently    Alcohol/week: 1.0 standard drink of alcohol    Types: 1 Glasses of wine per week    Comment: 1 glass of wine 3x a week   Drug use: No   Sexual activity: Not on file  Other Topics Concern   Not on file  Social History Narrative   Work or School: Clinical research associate - poetry      Home Situation: lives alone      Spiritual Beliefs: Jewish       Lifestyle: [facility]; diet healthy      Per [facility] New Patient Packet:      Diet: Heart healthy, low-fat      Caffeine: 1 cup of tea daily       Married, if yes what year: Single      Do you live in a house, apartment, assisted living, condo, trailer, ect: Apartment, 1 person, 7 stories  Pets: No      Current/Past profession: PHD, Child psychotherapist      Exercise: Yes, Treadmill, weights, and walking          Living Will: Yes   DNR: Yes   POA/HPOA: Yes      Functional Status:   Do you have difficulty bathing or dressing yourself? No   Do you have difficulty preparing food or eating? No   Do you have difficulty managing your medications? No   Do you have difficulty managing your finances? No   Do you have difficulty affording your medications? No         Social Drivers of Corporate investment banker Strain: Not on file  Food Insecurity: No Food Insecurity (01/14/2024)   Hunger Vital Sign    Worried About Running Out of Food in the Last Year: Never true    Ran Out of Food in the Last Year: Never true  Transportation Needs: No Transportation Needs (01/14/2024)   PRAPARE - Administrator, Civil Service (Medical): No    Lack of Transportation (Non-Medical): No  Physical Activity: Not on file  Stress: Not on file  Social Connections: Unknown (01/14/2024)   Social Connection and Isolation Panel    Frequency of Communication with Friends and Family: More than three times a week    Frequency of Social Gatherings with Friends and Family: Three times a week    Attends Religious Services: 1 to 4 times per year    Active Member of Clubs or Organizations: Yes    Attends Engineer, structural: More than 4 times per year    Marital Status: Not on file    Tobacco Counseling Counseling given: Not Answered   Clinical Intake:  Pre-visit preparation completed: Yes  Pain : No/denies pain     BMI - recorded: 18.94 Nutritional Status: BMI  <19  Underweight Nutritional Risks: None Diabetes: No  How often do you need to have someone help you when you read instructions, pamphlets, or other written materials from your doctor or pharmacy?: 3 - Sometimes What is the last grade level you completed in school?: college  Interpreter Needed?: No  Information entered by :: Fannie Alomar Lorenda Hark NP   Activities of Daily Living    04/07/2024   12:49 PM 01/14/2024    5:00 PM  In your present state of health, do you have any difficulty performing the following activities:  Hearing? 0 0  Vision? 0 0  Difficulty concentrating or making decisions? 0 0  Walking or climbing stairs? 1   Dressing or bathing? 1   Doing errands, shopping? 1 0  Preparing Food and eating ? N   Using the Toilet? N   In the past six months, have you accidently leaked urine? Y   Do you have problems with loss of bowel control? N   Managing your Medications? Y   Managing your Finances? Y   Housekeeping or managing your Housekeeping? Y     Patient Care Team: Jerome Heron Jama DEVONNA as PCP - General (Physician Assistant) Cindie Ole DASEN, MD as PCP - Electrophysiology (Cardiology) Mona Vinie BROCKS, MD as PCP - Cardiology (Cardiology) Dann Candyce RAMAN, MD as Consulting Physician (Cardiology)  Indicate any recent Medical Services you may have received from other than [facility] providers in the past year (date may be approximate).     Assessment:   This is a routine wellness examination for Stacy [name].  Hearing/Vision screen No results found.  Goals Addressed             This Visit's Progress    Functional Decline Minimized       Evidence-based guidance:  Assess fall risk, including balance and gait impairment, muscle weakness, diminished vision or hearing, environmental hazards, effects of medication and dehydration.  Assess and review gait speed, decreased muscle strength or impaired functional mobility; consider use of validated tool if available.   Prepare patient for rehabilitation services to develop an individualized activity and exercise program as indicated, such as progressive resistance strengthening, balance and activity tolerance training or home exercise program.  Prevent falls with environmental adjustment; encourage compliance with exercise program, management of incontinence and adequate vitamin D  and calcium  intake from food or supplements.  Promote gradual increase in intensity of activity and exercise as tolerated, such as duration, frequency, exercise repetition and sets and intensity.  Provide guidance and interventions aimed at fall prevention.  Review or assess presence of reduced muscle mass or results of dual-energy x-ray absorptiometry.   Notes:        Depression Screen    04/07/2024   12:50 PM 02/09/2020    1:36 PM 12/08/2017   10:14 AM 08/25/2016   10:58 AM 08/22/2015   10:08 AM 08/18/2014    9:36 AM  PHQ 2/9 Scores  PHQ - 2 Score 0 0 0 0 0 0  PHQ- 9 Score  0        Fall Risk    12/13/2020    3:25 PM 03/08/2020    9:13 AM 10/07/2019    9:19 AM 07/08/2019   10:00 AM 05/06/2019    9:35 AM  Fall Risk   Falls in the past year? 0 1 0  0  0   Number falls in past yr: 0 0 0 0  0   Injury with Fall? 0 0        Data saved with a previous flowsheet row definition    MEDICARE RISK AT HOME: Medicare Risk at Home Any stairs in or around the home?: Yes If so, are there any without handrails?: No Home free of loose throw rugs in walkways, pet beds, electrical cords, etc?: Yes Adequate lighting in your home to reduce risk of falls?: Yes Life alert?: No Use of a cane, walker or w/c?: Yes Shower chair or bench in shower?: Yes Elevated toilet seat or a handicapped toilet?: Yes  TIMED UP AND GO:  Was the test performed?  Yes  Length of time to ambulate 10 feet: 16 sec Gait slow and steady with assistive device    Cognitive Function:    02/09/2020    1:38 PM  MMSE - Mini Mental State Exam  Orientation to time 5   Orientation to Place 5  Registration 3  Attention/ Calculation 3  Recall 3  Language- name 2 objects 2  Language- repeat 1  Language- follow 3 step command 3  Language- read & follow direction 1  Write a sentence 1  Copy design 1  Total score 28        Immunizations Immunization History  Administered Date(s) Administered   INFLUENZA, HIGH DOSE SEASONAL PF 06/05/2015, 06/13/2016, 06/15/2017, 05/31/2018, 05/16/2019, 05/16/2020, 06/03/2023   Influenza,inj,Quad PF,6+ Mos 05/19/2014   Influenza,inj,quad, With Preservative 05/31/2018   Moderna Covid-19 Fall Seasonal Vaccine 27yrs & older 05/20/2023   Moderna SARS-COV2 Booster Vaccination 06/12/2020   Moderna Sars-Covid-2 Vaccination 08/08/2019, 09/05/2019   PNEUMOCOCCAL CONJUGATE-20 04/16/2023   Tdap 08/04/2010   Zoster Recombinant(Shingrix)  05/13/2011    TDAP status: Due, Education has been provided regarding the importance of this vaccine. Advised may receive this vaccine at local pharmacy or Health Dept. Aware to provide a copy of the vaccination record if obtained from local pharmacy or Health Dept. Verbalized acceptance and understanding.  Flu Vaccine status: Up to date  Pneumococcal vaccine status: Up to date  Covid-19 vaccine status: Information provided on how to obtain vaccines.   Qualifies for Shingles Vaccine? Yes   Zostavax completed No   Shingrix Completed?: No.    Education has been provided regarding the importance of this vaccine. Patient has been advised to call insurance company to determine out of pocket expense if they have not yet received this vaccine. Advised may also receive vaccine at local pharmacy or Health Dept. Verbalized acceptance and understanding.  Screening Tests Health Maintenance  Topic Date Due   Zoster Vaccines- Shingrix (2 of 2) 07/08/2011   DTaP/Tdap/Td (2 - Td or Tdap) 08/04/2020   Influenza Vaccine  05/04/2024 (Originally 03/04/2024)   COVID-19 Vaccine (4 - Moderna risk 2024-25  season) 05/04/2024 (Originally 04/04/2024)   Medicare Annual Wellness (AWV)  04/07/2025   Pneumococcal Vaccine: 50+ Years  Completed   HPV VACCINES  Aged Out   Meningococcal B Vaccine  Aged Out   DEXA SCAN  Discontinued    Health Maintenance  Health Maintenance Due  Topic Date Due   Zoster Vaccines- Shingrix (2 of 2) 07/08/2011   DTaP/Tdap/Td (2 - Td or Tdap) 08/04/2020    Colorectal cancer screening: No longer required.   Mammogram status: No longer required due to aged out.  Bone Density status: Completed 09/24/2016. Results reflect: Bone density results: OSTEOPOROSIS. Repeat every 2-3 years.  Lung Cancer Screening: (Low Dose CT Chest recommended if Age 1-80 years, 20 pack-year currently smoking OR have quit w/in 15years.) does not qualify.   Lung Cancer Screening Referral: none  Additional Screening:  Hepatitis C Screening: does not qualify;   Vision Screening: Recommended annual ophthalmology exams for early detection of glaucoma and other disorders of the eye. Is the patient up to date with their annual eye exam?  Yes  Who is the provider or what is the name of the office in which the patient attends annual eye exams? Dr.  If pt is not established with a provider, would they like to be referred to a provider to establish care? No .   Dental Screening: Recommended annual dental exams for proper oral hygiene  Diabetic Foot Exam: NA  Community Resource Referral / Chronic Care Management: CRR required this visit?  No   CCM required this visit?  No     Plan:     I have personally reviewed and noted the following in the patient's chart:   Medical and social history Use of alcohol, tobacco or illicit drugs  Current medications and supplements including opioid prescriptions. Patient is not currently taking opioid prescriptions. Functional ability and status Nutritional status Physical activity Advanced directives List of other physicians Hospitalizations,  surgeries, and ER visits in previous 12 months Vitals Screenings to include cognitive, depression, and falls Referrals and appointments  In addition, I have reviewed and discussed with patient certain preventive protocols, quality metrics, and best practice recommendations. A written personalized care plan for preventive services as well as general preventive health recommendations were provided to patient.     Tyreka Henneke X Ruqaya Strauss, NP   04/12/2024   After Visit Summary: (In Person-Declined) Patient declined AVS at this time.

## 2024-04-11 DIAGNOSIS — R278 Other lack of coordination: Secondary | ICD-10-CM | POA: Diagnosis not present

## 2024-04-11 DIAGNOSIS — R2689 Other abnormalities of gait and mobility: Secondary | ICD-10-CM | POA: Diagnosis not present

## 2024-04-11 DIAGNOSIS — R41841 Cognitive communication deficit: Secondary | ICD-10-CM | POA: Diagnosis not present

## 2024-04-12 ENCOUNTER — Encounter: Payer: Self-pay | Admitting: Nurse Practitioner

## 2024-04-12 DIAGNOSIS — R278 Other lack of coordination: Secondary | ICD-10-CM | POA: Diagnosis not present

## 2024-04-12 DIAGNOSIS — R2689 Other abnormalities of gait and mobility: Secondary | ICD-10-CM | POA: Diagnosis not present

## 2024-04-12 DIAGNOSIS — R41841 Cognitive communication deficit: Secondary | ICD-10-CM | POA: Diagnosis not present

## 2024-04-13 ENCOUNTER — Encounter: Payer: Self-pay | Admitting: Internal Medicine

## 2024-04-13 DIAGNOSIS — R2689 Other abnormalities of gait and mobility: Secondary | ICD-10-CM | POA: Diagnosis not present

## 2024-04-13 DIAGNOSIS — R278 Other lack of coordination: Secondary | ICD-10-CM | POA: Diagnosis not present

## 2024-04-13 DIAGNOSIS — R41841 Cognitive communication deficit: Secondary | ICD-10-CM | POA: Diagnosis not present

## 2024-04-15 DIAGNOSIS — R41841 Cognitive communication deficit: Secondary | ICD-10-CM | POA: Diagnosis not present

## 2024-04-15 DIAGNOSIS — R2689 Other abnormalities of gait and mobility: Secondary | ICD-10-CM | POA: Diagnosis not present

## 2024-04-15 DIAGNOSIS — R278 Other lack of coordination: Secondary | ICD-10-CM | POA: Diagnosis not present

## 2024-04-17 ENCOUNTER — Emergency Department (HOSPITAL_COMMUNITY)

## 2024-04-17 ENCOUNTER — Other Ambulatory Visit: Payer: Self-pay

## 2024-04-17 ENCOUNTER — Encounter (HOSPITAL_COMMUNITY): Payer: Self-pay | Admitting: Emergency Medicine

## 2024-04-17 ENCOUNTER — Emergency Department (HOSPITAL_COMMUNITY)
Admission: EM | Admit: 2024-04-17 | Discharge: 2024-04-17 | Disposition: A | Discharge status: Home / Self Care | Attending: Emergency Medicine | Admitting: Emergency Medicine

## 2024-04-17 DIAGNOSIS — S0003XA Contusion of scalp, initial encounter: Secondary | ICD-10-CM | POA: Diagnosis not present

## 2024-04-17 DIAGNOSIS — R42 Dizziness and giddiness: Secondary | ICD-10-CM | POA: Diagnosis not present

## 2024-04-17 DIAGNOSIS — S0990XA Unspecified injury of head, initial encounter: Secondary | ICD-10-CM | POA: Diagnosis not present

## 2024-04-17 DIAGNOSIS — Z043 Encounter for examination and observation following other accident: Secondary | ICD-10-CM | POA: Diagnosis not present

## 2024-04-17 DIAGNOSIS — W19XXXA Unspecified fall, initial encounter: Secondary | ICD-10-CM | POA: Diagnosis not present

## 2024-04-17 DIAGNOSIS — M4802 Spinal stenosis, cervical region: Secondary | ICD-10-CM | POA: Diagnosis not present

## 2024-04-17 DIAGNOSIS — I10 Essential (primary) hypertension: Secondary | ICD-10-CM | POA: Diagnosis not present

## 2024-04-17 DIAGNOSIS — W050XXA Fall from non-moving wheelchair, initial encounter: Secondary | ICD-10-CM | POA: Diagnosis not present

## 2024-04-17 DIAGNOSIS — Z7901 Long term (current) use of anticoagulants: Secondary | ICD-10-CM | POA: Insufficient documentation

## 2024-04-17 DIAGNOSIS — E039 Hypothyroidism, unspecified: Secondary | ICD-10-CM | POA: Insufficient documentation

## 2024-04-17 DIAGNOSIS — M4182 Other forms of scoliosis, cervical region: Secondary | ICD-10-CM | POA: Diagnosis not present

## 2024-04-17 DIAGNOSIS — M25511 Pain in right shoulder: Secondary | ICD-10-CM | POA: Diagnosis not present

## 2024-04-17 DIAGNOSIS — Z743 Need for continuous supervision: Secondary | ICD-10-CM | POA: Diagnosis not present

## 2024-04-17 DIAGNOSIS — I6529 Occlusion and stenosis of unspecified carotid artery: Secondary | ICD-10-CM | POA: Diagnosis not present

## 2024-04-17 DIAGNOSIS — S0101XA Laceration without foreign body of scalp, initial encounter: Secondary | ICD-10-CM | POA: Diagnosis not present

## 2024-04-17 DIAGNOSIS — Z7982 Long term (current) use of aspirin: Secondary | ICD-10-CM | POA: Insufficient documentation

## 2024-04-17 LAB — COMPREHENSIVE METABOLIC PANEL WITH GFR
ALT: 30 U/L (ref 0–44)
AST: 32 U/L (ref 15–41)
Albumin: 3.6 g/dL (ref 3.5–5.0)
Alkaline Phosphatase: 67 U/L (ref 38–126)
Anion gap: 10 (ref 5–15)
BUN: 14 mg/dL (ref 8–23)
CO2: 28 mmol/L (ref 22–32)
Calcium: 9.3 mg/dL (ref 8.9–10.3)
Chloride: 97 mmol/L — ABNORMAL LOW (ref 98–111)
Creatinine, Ser: 0.71 mg/dL (ref 0.44–1.00)
GFR, Estimated: >60 mL/min (ref >60)
Glucose, Bld: 119 mg/dL — ABNORMAL HIGH (ref 70–99)
Potassium: 3.5 mmol/L (ref 3.5–5.1)
Sodium: 135 mmol/L (ref 135–145)
Total Bilirubin: 1 mg/dL (ref 0.0–1.2)
Total Protein: 6.2 g/dL — ABNORMAL LOW (ref 6.5–8.1)

## 2024-04-17 LAB — CBC WITH DIFFERENTIAL/PLATELET
Abs Immature Granulocytes: 0.09 K/uL — ABNORMAL HIGH (ref 0.00–0.07)
Basophils Absolute: 0 K/uL (ref 0.0–0.1)
Basophils Relative: 0 %
Eosinophils Absolute: 0.1 K/uL (ref 0.0–0.5)
Eosinophils Relative: 1 %
HCT: 40 % (ref 36.0–46.0)
Hemoglobin: 13.4 g/dL (ref 12.0–15.0)
Immature Granulocytes: 1 %
Lymphocytes Relative: 14 %
Lymphs Abs: 1.4 K/uL (ref 0.7–4.0)
MCH: 33.3 pg (ref 26.0–34.0)
MCHC: 33.5 g/dL (ref 30.0–36.0)
MCV: 99.5 fL (ref 80.0–100.0)
Monocytes Absolute: 0.9 K/uL (ref 0.1–1.0)
Monocytes Relative: 9 %
Neutro Abs: 7.3 K/uL (ref 1.7–7.7)
Neutrophils Relative %: 75 %
Platelets: 149 K/uL — ABNORMAL LOW (ref 150–400)
RBC: 4.02 MIL/uL (ref 3.87–5.11)
RDW: 12.7 % (ref 11.5–15.5)
WBC: 9.8 K/uL (ref 4.0–10.5)
nRBC: 0 % (ref 0.0–0.2)

## 2024-04-17 LAB — PROTIME-INR
INR: 1.2 (ref 0.8–1.2)
Prothrombin Time: 15.8 s — ABNORMAL HIGH (ref 11.4–15.2)

## 2024-04-17 MED ORDER — LIDOCAINE-EPINEPHRINE (PF) 2 %-1:200000 IJ SOLN
10.0000 mL | Freq: Once | INTRAMUSCULAR | Status: AC
  Administered 2024-04-17: 10 mL via INTRADERMAL
  Filled 2024-04-17: qty 20

## 2024-04-17 NOTE — ED Notes (Signed)
 [facility] called for transport.

## 2024-04-17 NOTE — ED Provider Notes (Signed)
 [place] EMERGENCY DEPARTMENT AT [facility] Provider Note   CSN: [id number] Arrival date & time: 04/17/24  0345     Patient presents with: No chief complaint on file.   Stacy [name] is a 83 y.o. female who presents after mechanical fall at her ALF. She was trying to transfer from her wheelchair to the toilet when she slipped off and fell back into the wall striking her head and R shoulder. No loc, N/V/vision change. No CP or lightheadedness preceding the fall. ON eliquis  2.5 BID for afib.   Hx of hypothyroidism, HTN. Level 2 trauma on arrival for FOT.    HPI     Prior to Admission medications   Medication Sig Start Date End Date Taking? Authorizing Provider  aspirin  EC 81 MG tablet Take 1 tablet (81 mg total) by mouth daily. Swallow whole. 01/18/24   Regalado, Belkys A, MD  atorvastatin  (LIPITOR) 40 MG tablet Take 1 tablet (40 mg total) by mouth daily. 01/18/24   Regalado, Owen LABOR, MD  Benzocaine-Menthol-Zinc Cl (ORAJEL 3X TOOTHACHE & GUM) 20-0.26-0.15 % GEL Use as directed in the mouth or throat as needed (Place and dissolve 1 application buccally as needed for mouth pain may keep at bedside).    [provider]  Cholecalciferol  (VITAMIN D -3) 1000 UNITS CAPS Take 1,000 Units by mouth in the morning.    [provider]  Cranberry 250-30 MG TABS Take 2 tablets by mouth every evening.    [provider]  diltiazem  (CARTIA  XT) 180 MG 24 hr capsule Take 1 capsule (180 mg total) by mouth at bedtime. 03/25/23   Hilty, Vinie BROCKS, MD  ELIQUIS  2.5 MG TABS tablet Take 2.5 mg by mouth 2 (two) times daily. 02/26/23   [provider]  lactulose  (CHRONULAC ) 10 GM/15ML solution Take 30 mLs by mouth daily.    [provider]  levothyroxine  (SYNTHROID ) 125 MCG tablet TAKE 1 TABLET BY MOUTH DAILY BEFORE BREAKFAST. 03/11/24   Trixie File, MD  LORazepam  (ATIVAN ) 0.5 MG tablet Take 1 tablet by mouth as needed for anxiety.    [provider]  LORazepam  (ATIVAN ) 0.5 MG tablet Take 0.5 mg by mouth at bedtime.    [provider]  Multiple Vitamins-Minerals (MULTIVITAMIN GUMMIES ADULT PO) Take 2 Pieces by mouth every evening.    [provider]  Multiple Vitamins-Minerals (PRESERVISION AREDS PO) Take 1 tablet by mouth at bedtime.    [provider]  Probiotic CHEW Chew 1 each by mouth every evening.    [provider]  Simethicone (GAS-X PO) Take 1 tablet by mouth as needed.    [provider]  Wheat Dextrin (BENEFIBER PO) Take 0.5 Scoops by mouth as needed.    [provider]    Allergies: Amoxicillin, Flonase [fluticasone propionate], Sulfa antibiotics, and Sulfasalazine    Review of Systems  Skin:  Positive for wound.    Updated Vital Signs BP (!) 130/92   Physical Exam Vitals and nursing note reviewed.  Constitutional:      Appearance: She is not ill-appearing or toxic-appearing.  HENT:     Head: Normocephalic.      Mouth/Throat:     Mouth: Mucous membranes are moist.     Pharynx: No oropharyngeal exudate or posterior oropharyngeal erythema.  Eyes:     General:        Right eye: No discharge.        Left eye: No discharge.     Extraocular Movements: Extraocular  movements intact.     Conjunctiva/sclera: Conjunctivae normal.     Pupils: Pupils are equal, round, and reactive to light.  Neck:     Comments: C collar in place Cardiovascular:     Rate and Rhythm: Normal rate and regular rhythm.     Pulses: Normal pulses.     Heart sounds: Murmur heard.  Pulmonary:     Effort: Pulmonary effort is normal. No tachypnea, bradypnea, accessory muscle usage or respiratory distress.     Breath sounds: Normal breath sounds. No wheezing or rales.  Chest:     Chest wall: No mass, lacerations, deformity, swelling, tenderness or crepitus.  Abdominal:     General: Bowel sounds are normal. There is no distension.     Palpations: Abdomen is soft.      Tenderness: There is no abdominal tenderness. There is no right CVA tenderness, left CVA tenderness, guarding or rebound.  Musculoskeletal:        General: No deformity.       Arms:     Cervical back: Neck supple.     Right lower leg: No edema.     Left lower leg: No edema.  Skin:    General: Skin is warm and dry.     Capillary Refill: Capillary refill takes less than 2 seconds.     Findings: Wound present.  Neurological:     General: No focal deficit present.     Mental Status: She is alert and oriented to person, place, and time. Mental status is at baseline.     GCS: GCS eye subscore is 4. GCS verbal subscore is 5. GCS motor subscore is 6.  Psychiatric:        Mood and Affect: Mood normal.     (all labs ordered are listed, but only abnormal results are displayed) Labs Reviewed - No data to display  EKG: None  Radiology: No results found.   .Critical Care  Performed by: Bobette Pleasant SAUNDERS, PA-C Authorized by: Bobette Pleasant SAUNDERS, PA-C   Critical care provider statement:    Critical care time (minutes):  45   Critical care was time spent personally by me on the following activities:  Development of treatment plan with patient or surrogate, discussions with consultants, evaluation of patient's response to treatment, examination of patient, obtaining history from patient or surrogate, ordering and performing treatments and interventions, ordering and review of laboratory studies, ordering and review of radiographic studies, pulse oximetry and re-evaluation of patient's condition    Medications Ordered in the ED - No data to display                                  Medical Decision Making 83 y/o female with FOT.   HTN on intake, VS otherwise normal. Cardiopulmonary exam unremarkable, abdominal exam is benign. R posterior scalp hematoma; skin tear.   Amount and/or Complexity of Data Reviewed Labs: ordered.    Details: CBC, CMP, INR reassuring.  Radiology:  ordered.    Details: CT head and Cspine without acute intracranial abnormality or c-spine injury.  Risk Prescription drug management.   Reassuring workup; no wound requiring repair. Patient can be d/c back to her facility at this time. Clinical concern for emergent underlying condition that would warrant further ED workup or inpatient management is exceedingly low.   Suhaylah voiced understanding of her medical evaluation and treatment plan. Each of their questions answered to their expressed  satisfaction.  Return precautions were given.  Patient is well-appearing, stable, and was discharged in good condition.  This chart was dictated using voice recognition software, Dragon. Despite the best efforts of this provider to proofread and correct errors, errors may still occur which can change documentation meaning.      Final diagnoses:  None    ED Discharge Orders     None          Bobette Pleasant JONELLE DEVONNA 04/17/24 0555    Bari Charmaine FALCON, MD 04/17/24 [phone]

## 2024-04-17 NOTE — Discharge Instructions (Addendum)
 Stacy [name] was seen in the ER today after a fall.  Fortunately there is no severe injuries from her fall.  She has a hematoma and some bruising to the right shoulder.  She may have Tylenol  as needed for her discomfort.  She is to follow-up with her primary care doctor and return to the ER with any severe symptoms.

## 2024-04-17 NOTE — ED Notes (Addendum)
Patient transported to CT with Tobi Bastos, RN

## 2024-04-17 NOTE — ED Triage Notes (Signed)
 Pt in as Level 2 fall on Eliquis  from [facility]. Pt had stroke a few mo's ago and has been WC bound since, was trying to transfer over to the toilet tonight and slipped, striking her posterior head vs toilet, has some redness and pain to L shoulder. Last Eliquis  last night. A&ox4, c-collar present on arrival, no LOC reported. Pt has 1in lac to back of her head, dried blood present.   VS w/EMS: 75HR 180/100 20RR 99%RA 18G LFA

## 2024-04-18 ENCOUNTER — Encounter: Payer: Self-pay | Admitting: Sports Medicine

## 2024-04-18 ENCOUNTER — Non-Acute Institutional Stay: Payer: Self-pay | Admitting: Sports Medicine

## 2024-04-18 DIAGNOSIS — E039 Hypothyroidism, unspecified: Secondary | ICD-10-CM

## 2024-04-18 DIAGNOSIS — S0003XD Contusion of scalp, subsequent encounter: Secondary | ICD-10-CM | POA: Diagnosis not present

## 2024-04-18 DIAGNOSIS — F411 Generalized anxiety disorder: Secondary | ICD-10-CM

## 2024-04-18 DIAGNOSIS — I10 Essential (primary) hypertension: Secondary | ICD-10-CM

## 2024-04-18 DIAGNOSIS — I480 Paroxysmal atrial fibrillation: Secondary | ICD-10-CM

## 2024-04-18 NOTE — Progress Notes (Unsigned)
 Location:  [facility] Room Number: AL 918-A Place of Service:  Assisted living Provider: Sherlynn Albert, MD    Code Status: Full Code  Goals of Care:     04/18/2024   10:00 AM  Advanced Directives  Does Patient Have a Medical Advance Directive? No  Type of Advance Directive Healthcare Power of Attorney  Does patient want to make changes to medical advance directive? No - Patient declined  Copy of Healthcare Power of Attorney in Chart? Yes - validated most recent copy scanned in chart (See row information)     Chief Complaint  Patient presents with   Follow-up    ED follow up     HPI: Patient is a 83 y.o. female seen today for  ED follow up s/p Fall  Pt seen and examined in her room.  She is sitting in wheel chair. Seems pleasant and comfortable and does not appear to be in distress. Pt denies headache, nausea, vomiting, dizzy or lightheadedness Denies chest pain, palpitations, SOB, abdominal pain, hematuria, bloody or dark stools.  As per nursing staff no change in mentation , pt is currently on neuro checks.  She knows her name , oriented to time and place.    CT scan  IMPRESSION: 1. Right posterior convexity scalp hematoma and laceration. No skull fracture. 2. No acute intracranial abnormality. 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  IMPRESSION: 1. No acute traumatic injury identified in the cervical spine when allowing for motion artifact. 2. 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      Latest Ref Rng & Units 04/17/2024    3:53 AM 01/16/2024   11:11 AM 01/15/2024    5:43 AM  CBC  WBC 4.0 - 10.5 K/uL 9.8  6.7  5.9   Hemoglobin 12.0 - 15.0 g/dL 86.5  85.5  86.1   Hematocrit 36.0 - 46.0 % 40.0  40.5  39.1   Platelets 150 - 400 K/uL 149  154  165        Latest Ref Rng & Units 04/17/2024    3:53 AM 01/18/2024    4:43 AM 01/17/2024   11:29 AM  BMP   Glucose 70 - 99 mg/dL 880  88  890   BUN 8 - 23 mg/dL 14  17  11    Creatinine 0.44 - 1.00 mg/dL 9.28  9.36  9.43   Sodium 135 - 145 mmol/L 135  128  127   Potassium 3.5 - 5.1 mmol/L 3.5  3.8  4.1   Chloride 98 - 111 mmol/L 97  95  93   CO2 22 - 32 mmol/L 28  25  25    Calcium  8.9 - 10.3 mg/dL 9.3  8.5  8.8     Past Medical History:  Diagnosis Date   Atrial fibrillation (HCC)    echo 12/23/10 EF= >55%, stress myoview 01/30/11 normal pattern of perfusion in all myocardial regions   Carotid artery stenosis    Per [facility] new patient packet    Chicken pox    Colon polyp    Colon polyp    Dyslipidemia    Heart murmur    Hiatal hernia    Hypertension    Hypothyroidism    Laryngopharyngeal reflux    Osteopenia    Scoliosis    Seasonal allergies    Thyroid  disease    Vitamin D  deficiency     Past Surgical History:  Procedure Laterality  Date   BREAST CYST EXCISION Right 1988   ESOPHAGEAL MANOMETRY N/A 08/29/2015   Procedure: ESOPHAGEAL MANOMETRY (EM);  Surgeon: Elspeth Deward Naval, MD;  Location: [facility];  Service: Gastroenterology;  Laterality: N/A;   EYE SURGERY     Cataract eye surgery-right 06/2014, left 04/2014   ORIF PATELLA Right 07/08/2022   Procedure: OPEN REDUCTION INTERNAL FIXATION (ORIF) PATELLA;  Surgeon: Josefina Chew, MD;  Location: [facility];  Service: Orthopedics;  Laterality: Right;   TONSILLECTOMY     Childhood    Allergies  Allergen Reactions   Amoxicillin Swelling   Flonase [Fluticasone Propionate] Swelling    Swelling of throat per patient    Sulfa Antibiotics Other (See Comments)    Upset stomach   Sulfasalazine Other (See Comments)    Upset stomach    Outpatient Encounter Medications as of 04/18/2024  Medication Sig   aspirin  EC 81 MG tablet Take 1 tablet (81 mg total) by mouth daily. Swallow whole.   atorvastatin  (LIPITOR) 40 MG tablet Take 1 tablet (40 mg total) by mouth daily.   Benzocaine-Menthol-Zinc Cl (ORAJEL 3X  TOOTHACHE & GUM) 20-0.26-0.15 % GEL Use as directed in the mouth or throat as needed (Place and dissolve 1 application buccally as needed for mouth pain may keep at bedside).   Cholecalciferol  (VITAMIN D -3) 1000 UNITS CAPS Take 1,000 Units by mouth in the morning.   Cranberry 250-30 MG TABS Take 2 tablets by mouth every evening.   diltiazem  (CARTIA  XT) 180 MG 24 hr capsule Take 1 capsule (180 mg total) by mouth at bedtime.   ELIQUIS  2.5 MG TABS tablet Take 2.5 mg by mouth 2 (two) times daily.   lactulose  (CHRONULAC ) 10 GM/15ML solution Take 30 mLs by mouth daily.   levothyroxine  (SYNTHROID ) 125 MCG tablet TAKE 1 TABLET BY MOUTH DAILY BEFORE BREAKFAST.   LORazepam  (ATIVAN ) 0.5 MG tablet Take 1 tablet by mouth as needed for anxiety.   LORazepam  (ATIVAN ) 0.5 MG tablet Take 0.5 mg by mouth at bedtime.   Multiple Vitamins-Minerals (MULTIVITAMIN GUMMIES ADULT PO) Take 2 Pieces by mouth every evening.   Multiple Vitamins-Minerals (PRESERVISION AREDS PO) Take 1 tablet by mouth at bedtime.   Probiotic CHEW Chew 1 each by mouth every evening.   Simethicone (GAS-X PO) Take 1 tablet by mouth as needed.   Wheat Dextrin (BENEFIBER PO) Take 0.5 Scoops by mouth as needed.   No facility-administered encounter medications on file as of 04/18/2024.    Review of Systems:  Review of Systems  Constitutional:  Negative for fever.  Respiratory:  Negative for cough, shortness of breath and wheezing.   Cardiovascular:  Negative for chest pain and leg swelling.  Gastrointestinal:  Negative for abdominal distention, abdominal pain, blood in stool, constipation, diarrhea, nausea and vomiting.  Genitourinary:  Negative for dysuria.  Neurological:  Negative for dizziness.  Psychiatric/Behavioral:  Negative for confusion.     Health Maintenance  Topic Date Due   Zoster Vaccines- Shingrix (2 of 2) 07/08/2011   DTaP/Tdap/Td (2 - Td or Tdap) 08/04/2020   Influenza Vaccine  05/04/2024 (Originally 03/04/2024)   COVID-19  Vaccine (4 - Moderna risk 2024-25 season) 05/04/2024 (Originally 04/04/2024)   Medicare Annual Wellness (AWV)  04/07/2025   Pneumococcal Vaccine: 50+ Years  Completed   HPV VACCINES  Aged Out   Meningococcal B Vaccine  Aged Out   DEXA SCAN  Discontinued    Physical Exam: Vitals:   04/18/24 0957  BP: (!) 151/77  Pulse: 85  Resp: 19  Temp: (!) 97.4 F (36.3 C)  SpO2: 95%  Weight: 113 lb 9.6 oz (51.5 kg)  Height: 5' 5 (1.651 m)   Body mass index is 18.9 kg/m. Physical Exam Constitutional:      Appearance: Normal appearance.  HENT:     Head: Normocephalic and atraumatic.  Cardiovascular:     Rate and Rhythm: Normal rate and regular rhythm.  Pulmonary:     Effort: Pulmonary effort is normal. No respiratory distress.     Breath sounds: Normal breath sounds. No wheezing.  Abdominal:     General: Bowel sounds are normal. There is no distension.     Tenderness: There is no abdominal tenderness. There is no guarding or rebound.     Comments:    Musculoskeletal:        General: No swelling or tenderness.  Neurological:     Mental Status: She is alert. Mental status is at baseline.     Motor: No weakness.     Labs reviewed: Basic Metabolic Panel: Recent Labs    01/02/24 2248 01/03/24 0214 01/14/24 1224 01/14/24 1226 01/14/24 1401 01/15/24 0543 01/16/24 1819 01/17/24 1129 01/18/24 0443 04/17/24 0353  NA  --    < > 129*   < >  --    < > 127* 127* 128* 135  K  --    < > 3.9   < >  --    < > 3.3* 4.1 3.8 3.5  CL  --    < > 96*   < >  --    < > 91* 93* 95* 97*  CO2  --    < > 23  --   --    < > 25 25 25 28   GLUCOSE  --    < > 113*   < >  --    < > 92 109* 88 119*  BUN  --    < > 19   < >  --    < > 16 11 17 14   CREATININE  --    < > 0.89   < >  --    < > 0.70 0.56 0.63 0.71  CALCIUM   --    < > 9.2  --   --    < > 8.7* 8.8* 8.5* 9.3  MG 1.9  --  1.9  --   --   --  1.8 2.0  --   --   PHOS 3.1  --   --   --   --   --   --   --   --   --   TSH 6.149*  --   --   --   5.952*  --   --   --   --   --    < > = values in this interval not displayed.   Liver Function Tests: Recent Labs    01/14/24 1224 01/15/24 0543 02/18/24 0000 04/17/24 0353  AST 26 34 14 32  ALT 20 22 16 30   ALKPHOS 56 68 67 67  BILITOT 0.9 1.1  --  1.0  PROT 6.5 6.2*  --  6.2*  ALBUMIN 3.9 3.6 3.4* 3.6   No results for input(s): LIPASE, AMYLASE in the last 8760 hours. No results for input(s): AMMONIA in the last 8760 hours. CBC: Recent Labs    01/14/24 1224 01/14/24 1226 01/15/24 0543 01/16/24 1111 04/17/24 0353  WBC 5.4  --  5.9 6.7  9.8  NEUTROABS 3.9  --   --   --  7.3  HGB 13.6   < > 13.8 14.4 13.4  HCT 38.8   < > 39.1 40.5 40.0  MCV 93.9  --  93.5 92.0 99.5  PLT 153  --  165 154 149*   < > = values in this interval not displayed.   Lipid Panel: Recent Labs    01/15/24 0543 02/18/24 0000  CHOL 237* 158  HDL 101 71*  LDLCALC 128* 73  TRIG 42 49  CHOLHDL 2.3  --    Lab Results  Component Value Date   HGBA1C 5.0 01/15/2024    Procedures since last visit: CT Cervical Spine Wo Contrast Result Date: 04/17/2024 CLINICAL DATA:  83 year old female status post fall in bathroom striking head on toilet. On Eliquis . EXAM: CT CERVICAL SPINE WITHOUT CONTRAST TECHNIQUE: Multidetector CT imaging of the cervical spine was performed without intravenous contrast. Multiplanar CT image reconstructions were also generated. RADIATION DOSE REDUCTION: This exam was performed according to the departmental dose-optimization program which includes automated exposure control, adjustment of the mA and/or kV according to patient size and/or use of iterative reconstruction technique. COMPARISON:  Head CT today.  CTA head and neck 01/14/2024. FINDINGS: Frequent motion artifact on this exam. Alignment: Stable cervical lordosis. Mild chronic dextroconvex cervical scoliosis. Cervicothoracic junction alignment is within normal limits. Mild upper cervical degenerative appearing  retrolisthesis is stable. Bilateral posterior element alignment is within normal limits. Skull base and vertebrae: Visualized skull base is intact. No atlanto-occipital dissociation. Congenital incomplete ossification of the posterior C1 ring, normal variant. C1 and C2 appear intact and aligned. No acute osseous abnormality identified. Soft tissues and spinal canal: No prevertebral fluid or swelling. No visible canal hematoma. Larynx and pharynx motion artifact. Calcified cervical carotid atherosclerosis. Tortuous right brachiocephalic artery in the right lower neck. Disc levels: Multifactorial chronic cervical spinal stenosis which appears at least moderate at C4-C5, mild-to-moderate at C3-C4. Upper chest: Tortuous aortic arch and proximal great vessels. Visible upper thoracic levels appear stable and intact. Negative lung apices. IMPRESSION: 1. No acute traumatic injury identified in the cervical spine when allowing for motion artifact. 2. 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 Electronically Signed   By: VEAR Hurst M.D.   On: 04/17/2024 04:24   CT Head Wo Contrast Result Date: 04/17/2024 CLINICAL DATA:  83 year old female status post fall in bathroom striking head on toilet. On Eliquis . EXAM: CT HEAD WITHOUT CONTRAST TECHNIQUE: Contiguous axial images were obtained from the base of the skull through the vertex without intravenous contrast. RADIATION DOSE REDUCTION: This exam was performed according to the departmental dose-optimization program which includes automated exposure control, adjustment of the mA and/or kV according to patient size and/or use of iterative reconstruction technique. COMPARISON:  Brain MRI, Head CT 01/14/2024. FINDINGS: Brain: Stable cerebral volume. Expected evolution of lateral right thalamic lacunar infarct seen in June. No midline shift, ventriculomegaly, mass effect, evidence of mass lesion, intracranial hemorrhage or  evidence of cortically based acute infarction. And otherwise stable gray-white differentiation, with mild to moderate for age patchy white matter hypodensity. Vascular: No suspicious intracranial vascular hyperdensity. Calcified atherosclerosis at the skull base. Skull: Stable. No acute fracture identified. 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 Sinuses/Orbits: Mild new mucosal thickening right maxillary sinus. Other Visualized paranasal sinuses and mastoids are clear. Other: Recurrent right posterior convexity broad-based scalp hematoma, now with laceration and small volume soft  tissue gas there (series 7, image 40). Underlying calvarium appears stable and intact. Stable orbits soft tissues. IMPRESSION: 1. Right posterior convexity scalp hematoma and laceration. No skull fracture. 2. No acute intracranial abnormality. 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 Electronically Signed   By: VEAR Hurst M.D.   On: 04/17/2024 04:20   DG Shoulder Right Portable Result Date: 04/17/2024 CLINICAL DATA:  Recent fall with right shoulder pain, initial encounter EXAM: RIGHT SHOULDER - 3 VIEW COMPARISON:  None Available. FINDINGS: No acute fracture or dislocation is noted. Underlying bony thorax appears within normal limits. No soft tissue abnormality is seen. IMPRESSION: No acute abnormality noted. Electronically Signed   By: Oneil Devonshire M.D.   On: 04/17/2024 04:00    Assessment/Plan 1. Hematoma of scalp, subsequent encounter (Primary) Denies headache, nausea, vomiting Cont with neurochecks   2. Paroxysmal atrial fibrillation (HCC) Heart rate high  Pt says she is anxious this morning Will give 1 time dose of ativan   Pt denies chest pain, palpitations, SOB, dizzy or lightheadedness Pt did not receive morning meds yet, instructed nurse to give 1 time dose of ativan , morning meds and recheck vitals in 1 hr  Monitor for chest pain, palpitations, sob   3.  Primary hypertension Cont with cardizem   4. Hypothyroidism, unspecified type Cont with levothyroixine  5. GAD (generalized anxiety disorder) Cont with ativan    Labs/tests ordered:  * No order type specified * Next appt:  Visit date not found

## 2024-04-19 ENCOUNTER — Encounter: Payer: Self-pay | Admitting: Internal Medicine

## 2024-04-19 ENCOUNTER — Encounter: Payer: Self-pay | Admitting: Nurse Practitioner

## 2024-04-19 ENCOUNTER — Non-Acute Institutional Stay: Payer: Self-pay | Admitting: Nurse Practitioner

## 2024-04-19 DIAGNOSIS — I10 Essential (primary) hypertension: Secondary | ICD-10-CM

## 2024-04-19 DIAGNOSIS — R278 Other lack of coordination: Secondary | ICD-10-CM | POA: Diagnosis not present

## 2024-04-19 DIAGNOSIS — E039 Hypothyroidism, unspecified: Secondary | ICD-10-CM

## 2024-04-19 DIAGNOSIS — E782 Mixed hyperlipidemia: Secondary | ICD-10-CM | POA: Diagnosis not present

## 2024-04-19 DIAGNOSIS — Z961 Presence of intraocular lens: Secondary | ICD-10-CM | POA: Diagnosis not present

## 2024-04-19 DIAGNOSIS — I480 Paroxysmal atrial fibrillation: Secondary | ICD-10-CM

## 2024-04-19 DIAGNOSIS — H02403 Unspecified ptosis of bilateral eyelids: Secondary | ICD-10-CM | POA: Diagnosis not present

## 2024-04-19 DIAGNOSIS — E871 Hypo-osmolality and hyponatremia: Secondary | ICD-10-CM | POA: Diagnosis not present

## 2024-04-19 DIAGNOSIS — S8012XA Contusion of left lower leg, initial encounter: Secondary | ICD-10-CM | POA: Diagnosis not present

## 2024-04-19 DIAGNOSIS — F5101 Primary insomnia: Secondary | ICD-10-CM | POA: Diagnosis not present

## 2024-04-19 DIAGNOSIS — R2689 Other abnormalities of gait and mobility: Secondary | ICD-10-CM | POA: Diagnosis not present

## 2024-04-19 DIAGNOSIS — I6340 Cerebral infarction due to embolism of unspecified cerebral artery: Secondary | ICD-10-CM | POA: Diagnosis not present

## 2024-04-19 DIAGNOSIS — R41841 Cognitive communication deficit: Secondary | ICD-10-CM | POA: Diagnosis not present

## 2024-04-19 NOTE — Assessment & Plan Note (Addendum)
 Heart rate seem controlled,  on Eliquis , declined increasing Diltiazem  ER from 180mg  to 240mg  daily. Bp/P daily for now 04/19/24 Na 133, K 3.8, Bun 18, creat 0.53, wbc 6.7, Hgb 11.1, plt 126, neutrophils 71.6%

## 2024-04-19 NOTE — Assessment & Plan Note (Signed)
 TSH 2.79 03/29/24, on Levothyroxine .

## 2024-04-19 NOTE — Assessment & Plan Note (Signed)
 Permissive blood pressure control,  on Amlodipine, Diltiazem . Bun/creat 14/0.71 on 04/17/2024

## 2024-04-19 NOTE — Assessment & Plan Note (Signed)
 on Atorvastatin , LDL 73 02/18/24

## 2024-04-19 NOTE — Assessment & Plan Note (Signed)
 left calf hematoma sustained from a mechanical fall, request workup to rule out blood clot.  No pain in the left calf on palpitated except to right around hematoma or with the left foot dorsiflexion, no increased swelling/warmth. Up to date CBC/differential, CMP/GFR, venous ultrasound left leg

## 2024-04-19 NOTE — Telephone Encounter (Signed)
 Patient sent this message:    Fortunately, I was able to get a second opinion that agreed with me to leave my meds as they were.  Sorry to bother you.   Stacy [name]        This  encounter has been closed.

## 2024-04-19 NOTE — Assessment & Plan Note (Signed)
 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 Na 13 5 04/17/2024

## 2024-04-19 NOTE — Assessment & Plan Note (Signed)
 Managed, on Lorazepam 

## 2024-04-19 NOTE — Progress Notes (Signed)
 Location:   [facility] Room Number: 918 Place of Service:  AL[facility] (13) Provider: Larwance Naftula Donahue NP  Jerome Heron Ruth, PA-C  Patient Care Team: Jerome Heron Ruth DEVONNA as PCP - General (Physician Assistant) Cindie Ole DASEN, MD as PCP - Electrophysiology (Cardiology) Mona Vinie BROCKS, MD as PCP - Cardiology (Cardiology) Dann Candyce RAMAN, MD as Consulting Physician (Cardiology)  Extended Emergency Contact Information Primary Emergency Contact: Courtland Devere PERSONS, [place] [address] [place] Home Phone: [phone] Mobile Phone: [phone] Relation: Niece  Code Status: DNR Goals of care: Advanced Directive information    04/18/2024   10:00 AM  Advanced Directives  Does Patient Have a Medical Advance Directive? No  Type of Advance Directive Healthcare Power of Attorney  Does patient want to make changes to medical advance directive? No - Patient declined  Copy of Healthcare Power of Attorney in Chart? Yes - validated most recent copy scanned in chart (See row information)     Chief Complaint  Patient presents with   Acute Visit    Reported left calf hematoma, request workup to rule out blood clot    HPI:  Pt is a 83 y.o. female seen today for an acute visit for left calf hematoma sustained from a mechanical fall, request workup to rule out blood clot.  No pain in the left calf on palpitated except to right around hematoma or with the left foot dorsiflexion, no increased swelling/warmth.  ED evaluation 04/17/2024 for scalp hematoma, unremarkable CMP, PT/INR, CBC with differential, x-ray of the right shoulder, CT head/cervical spine   CVA, right thalamus MRI brain,  with left sided weakness and dysarthria  hospitalized 01/14/24-01/18/24, f/u stroke clinic/neurology. 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 Na 13 5 04/17/2024             GAD/insomnia, on Lorazepam              HTN, on Amlodipine, Diltiazem . Bun/creat 14/0.71 on 04/17/2024             Afib, on Eliquis , Diltiazem              Chronic cystitis/urinary frequency.  Hypothyroidism, taking levothyroxine , TSH 2.79 03/29/2024             HLD/carotid artery stenosis, on Atorvastatin , LDL 73 02/18/24             IBS/constipation, managed.                   Past Medical History:  Diagnosis Date   Atrial fibrillation (HCC)    echo 12/23/10 EF= >55%, stress myoview 01/30/11 normal pattern of perfusion in all myocardial regions   Carotid artery stenosis    Per [facility] new patient packet    Chicken pox    Colon polyp    Colon polyp    Dyslipidemia    Heart murmur    Hiatal hernia    Hypertension    Hypothyroidism    Laryngopharyngeal reflux    Osteopenia    Scoliosis    Seasonal allergies    Thyroid  disease    Vitamin D  deficiency    Past Surgical History:  Procedure Laterality Date   BREAST CYST EXCISION Right 1988   ESOPHAGEAL MANOMETRY N/A 08/29/2015   Procedure: ESOPHAGEAL MANOMETRY (EM);  Surgeon: Elspeth Deward Naval, MD;  Location: [facility];  Service: Gastroenterology;  Laterality: N/A;   EYE SURGERY     Cataract eye surgery-right 06/2014, left 04/2014   ORIF PATELLA Right 07/08/2022   Procedure: OPEN REDUCTION INTERNAL FIXATION (ORIF) PATELLA;  Surgeon: Josefina Chew, MD;  Location: [facility];  Service: Orthopedics;  Laterality: Right;   TONSILLECTOMY     Childhood    Allergies  Allergen Reactions   Amoxicillin Swelling   Flonase [Fluticasone Propionate] Swelling    Swelling of throat per patient    Sulfa Antibiotics Other (See Comments)    Upset stomach   Sulfasalazine Other (See Comments)    Upset stomach    Allergies as of 04/19/2024       Reactions   Amoxicillin Swelling   Flonase  [fluticasone Propionate] Swelling   Swelling of throat per patient   Sulfa Antibiotics Other (See Comments)   Upset stomach   Sulfasalazine Other (See Comments)   Upset stomach        Medication List        Accurate as of April 19, 2024  4:16 PM. If you have any questions, ask your nurse or doctor.          aspirin  EC 81 MG tablet Take 1 tablet (81 mg total) by mouth daily. Swallow whole.   atorvastatin  40 MG tablet Commonly known as: LIPITOR Take 1 tablet (40 mg total) by mouth daily.   BENEFIBER PO Take 0.5 Scoops by mouth as needed.   Cranberry 250-30 MG Tabs Take 2 tablets by mouth every evening.   diltiazem  180 MG 24 hr capsule Commonly known as: Cartia  XT Take 1 capsule (180 mg total) by mouth at bedtime.   Eliquis  2.5 MG Tabs tablet Generic drug: apixaban  Take 2.5 mg by mouth 2 (two) times daily.   GAS-X PO Take 1 tablet by mouth as needed.   lactulose  10 GM/15ML solution Commonly known as: CHRONULAC  Take 30 mLs by mouth daily.   levothyroxine  125 MCG tablet Commonly known as: SYNTHROID  TAKE 1 TABLET BY MOUTH DAILY BEFORE BREAKFAST.   LORazepam  0.5 MG tablet Commonly known as: ATIVAN  Take 1 tablet by mouth as needed for anxiety.   LORazepam  0.5 MG tablet Commonly known as: ATIVAN  Take 0.5 mg by mouth at bedtime.   Orajel 3X Toothache & Gum 20-0.26-0.15 % Gel Generic drug: Benzocaine-Menthol-Zinc Cl Use as directed in the mouth or throat as needed (Place and dissolve 1 application buccally as needed for mouth pain may keep at bedside).   PRESERVISION AREDS PO Take 1 tablet by mouth at bedtime.   MULTIVITAMIN GUMMIES ADULT PO Take 2 Pieces by mouth every evening.   Probiotic Chew Chew 1 each by mouth every evening.   Vitamin D -3 25 MCG (1000 UT) Caps Take 1,000 Units by mouth in the morning.        Review of Systems  Constitutional:  Positive for fatigue. Negative for appetite change and fever.  HENT:  Negative for congestion  and trouble swallowing.   Eyes:  Negative for visual disturbance.  Respiratory:  Negative for cough and shortness of breath.   Cardiovascular:  Negative for chest pain, palpitations and leg swelling.  Gastrointestinal:  Negative for abdominal pain, constipation, nausea and vomiting.       Occasional, on diet.   Genitourinary:  Positive for  frequency. Negative for difficulty urinating, dysuria and urgency.  Musculoskeletal:  Negative for back pain and gait problem.  Skin:  Negative for color change.       Him Thoma on scalp, left calf  Neurological:  Positive for weakness. Negative for dizziness, speech difficulty and headaches.  Psychiatric/Behavioral:  Positive for sleep disturbance. Negative for behavioral problems. The patient is nervous/anxious.     Immunization History  Administered Date(s) Administered   INFLUENZA, HIGH DOSE SEASONAL PF 06/05/2015, 06/13/2016, 06/15/2017, 05/31/2018, 05/16/2019, 05/16/2020, 06/03/2023   Influenza,inj,Quad PF,6+ Mos 05/19/2014   Influenza,inj,quad, With Preservative 05/31/2018   Moderna Covid-19 Fall Seasonal Vaccine 87yrs & older 05/20/2023   Moderna SARS-COV2 Booster Vaccination 06/12/2020   Moderna Sars-Covid-2 Vaccination 08/08/2019, 09/05/2019   PNEUMOCOCCAL CONJUGATE-20 04/16/2023   Tdap 08/04/2010   Zoster Recombinant(Shingrix) 05/13/2011   Pertinent  Health Maintenance Due  Topic Date Due   Influenza Vaccine  05/04/2024 (Originally 03/04/2024)   DEXA SCAN  Discontinued      03/08/2020    9:13 AM 09/30/2020    3:25 PM 12/13/2020    3:25 PM 07/07/2022   11:36 AM 07/08/2022   10:43 AM  Fall Risk  Falls in the past year? 1  0    Was there an injury with Fall? 0  0    Fall Risk Category Calculator 1  0    Fall Risk Category (Retired) Low   Low     (RETIRED) Patient Fall Risk Level  Low fall risk  Low fall risk  Low fall risk  High fall risk      Data saved with a previous flowsheet row definition   Functional Status Survey:     Vitals:   04/19/24 1215  BP: (!) 148/79  Pulse: 88  Resp: 18  Temp: 97.9 F (36.6 C)  SpO2: 95%  Weight: 113 lb 9.6 oz (51.5 kg)   Body mass index is 18.9 kg/m. Physical Exam Vitals and nursing note reviewed.  Constitutional:      Appearance: Normal appearance.  HENT:     Head: Normocephalic and atraumatic.     Nose: Nose normal.     Mouth/Throat:     Mouth: Mucous membranes are moist.  Eyes:     Extraocular Movements: Extraocular movements intact.     Conjunctiva/sclera: Conjunctivae normal.     Pupils: Pupils are equal, round, and reactive to light.  Cardiovascular:     Rate and Rhythm: Normal rate. Rhythm irregular.     Heart sounds: Murmur heard.  Pulmonary:     Effort: Pulmonary effort is normal.     Breath sounds: No rales.  Abdominal:     General: Bowel sounds are normal.     Palpations: Abdomen is soft.     Tenderness: There is no abdominal tenderness.  Musculoskeletal:     Cervical back: Normal range of motion and neck supple.     Right lower leg: No edema.     Left lower leg: No edema.  Skin:    General: Skin is warm and dry.     Findings: Bruising present.     Comments: Left calf hematoma, scalp left occipital laceration.   Neurological:     General: No focal deficit present.     Mental Status: She is alert and oriented to person, place, and time. Mental status is at baseline.     Motor: Weakness present.     Coordination: Coordination normal.     Gait: Gait abnormal.     Deep  Tendon Reflexes: Reflexes normal.     Comments: Left sided weakness with muscle strength 5/5-no change  Psychiatric:        Mood and Affect: Mood normal.        Behavior: Behavior normal.        Thought Content: Thought content normal.        Judgment: Judgment normal.     Comments: Anxious     Labs reviewed: Recent Labs    01/02/24 2248 01/03/24 0214 01/14/24 1224 01/14/24 1226 01/16/24 1819 01/17/24 1129 01/18/24 0443 04/17/24 0353  NA  --    < > 129*   < >  127* 127* 128* 135  K  --    < > 3.9   < > 3.3* 4.1 3.8 3.5  CL  --    < > 96*   < > 91* 93* 95* 97*  CO2  --    < > 23   < > 25 25 25 28   GLUCOSE  --    < > 113*   < > 92 109* 88 119*  BUN  --    < > 19   < > 16 11 17 14   CREATININE  --    < > 0.89   < > 0.70 0.56 0.63 0.71  CALCIUM   --    < > 9.2   < > 8.7* 8.8* 8.5* 9.3  MG 1.9  --  1.9  --  1.8 2.0  --   --   PHOS 3.1  --   --   --   --   --   --   --    < > = values in this interval not displayed.   Recent Labs    01/14/24 1224 01/15/24 0543 02/18/24 0000 04/17/24 0353  AST 26 34 14 32  ALT 20 22 16 30   ALKPHOS 56 68 67 67  BILITOT 0.9 1.1  --  1.0  PROT 6.5 6.2*  --  6.2*  ALBUMIN 3.9 3.6 3.4* 3.6   Recent Labs    01/14/24 1224 01/14/24 1226 01/15/24 0543 01/16/24 1111 04/17/24 0353  WBC 5.4  --  5.9 6.7 9.8  NEUTROABS 3.9  --   --   --  7.3  HGB 13.6   < > 13.8 14.4 13.4  HCT 38.8   < > 39.1 40.5 40.0  MCV 93.9  --  93.5 92.0 99.5  PLT 153  --  165 154 149*   < > = values in this interval not displayed.   Lab Results  Component Value Date   TSH 5.952 (H) 01/14/2024   Lab Results  Component Value Date   HGBA1C 5.0 01/15/2024   Lab Results  Component Value Date   CHOL 158 02/18/2024   HDL 71 (A) 02/18/2024   LDLCALC 73 02/18/2024   LDLDIRECT 148.1 08/17/2013   TRIG 49 02/18/2024   CHOLHDL 2.3 01/15/2024    Significant Diagnostic Results in last 30 days:  CT Cervical Spine Wo Contrast Result Date: 04/17/2024 CLINICAL DATA:  83 year old female status post fall in bathroom striking head on toilet. On Eliquis . EXAM: CT CERVICAL SPINE WITHOUT CONTRAST TECHNIQUE: Multidetector CT imaging of the cervical spine was performed without intravenous contrast. Multiplanar CT image reconstructions were also generated. RADIATION DOSE REDUCTION: This exam was performed according to the departmental dose-optimization program which includes automated exposure control, adjustment of the mA and/or kV according to patient  size and/or use of iterative reconstruction technique. COMPARISON:  Head CT today.  CTA head and neck 01/14/2024. FINDINGS: Frequent motion artifact on this exam. Alignment: Stable cervical lordosis. Mild chronic dextroconvex cervical scoliosis. Cervicothoracic junction alignment is within normal limits. Mild upper cervical degenerative appearing retrolisthesis is stable. Bilateral posterior element alignment is within normal limits. Skull base and vertebrae: Visualized skull base is intact. No atlanto-occipital dissociation. Congenital incomplete ossification of the posterior C1 ring, normal variant. C1 and C2 appear intact and aligned. No acute osseous abnormality identified. Soft tissues and spinal canal: No prevertebral fluid or swelling. No visible canal hematoma. Larynx and pharynx motion artifact. Calcified cervical carotid atherosclerosis. Tortuous right brachiocephalic artery in the right lower neck. Disc levels: Multifactorial chronic cervical spinal stenosis which appears at least moderate at C4-C5, mild-to-moderate at C3-C4. Upper chest: Tortuous aortic arch and proximal great vessels. Visible upper thoracic levels appear stable and intact. Negative lung apices. IMPRESSION: 1. No acute traumatic injury identified in the cervical spine when allowing for motion artifact. 2. 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 Electronically Signed   By: VEAR Hurst M.D.   On: 04/17/2024 04:24   CT Head Wo Contrast Result Date: 04/17/2024 CLINICAL DATA:  83 year old female status post fall in bathroom striking head on toilet. On Eliquis . EXAM: CT HEAD WITHOUT CONTRAST TECHNIQUE: Contiguous axial images were obtained from the base of the skull through the vertex without intravenous contrast. RADIATION DOSE REDUCTION: This exam was performed according to the departmental dose-optimization program which includes automated exposure control, adjustment of the mA  and/or kV according to patient size and/or use of iterative reconstruction technique. COMPARISON:  Brain MRI, Head CT 01/14/2024. FINDINGS: Brain: Stable cerebral volume. Expected evolution of lateral right thalamic lacunar infarct seen in June. No midline shift, ventriculomegaly, mass effect, evidence of mass lesion, intracranial hemorrhage or evidence of cortically based acute infarction. And otherwise stable gray-white differentiation, with mild to moderate for age patchy white matter hypodensity. Vascular: No suspicious intracranial vascular hyperdensity. Calcified atherosclerosis at the skull base. Skull: Stable. No acute fracture identified. 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 Sinuses/Orbits: Mild new mucosal thickening right maxillary sinus. Other Visualized paranasal sinuses and mastoids are clear. Other: Recurrent right posterior convexity broad-based scalp hematoma, now with laceration and small volume soft tissue gas there (series 7, image 40). Underlying calvarium appears stable and intact. Stable orbits soft tissues. IMPRESSION: 1. Right posterior convexity scalp hematoma and laceration. No skull fracture. 2. No acute intracranial abnormality. 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 Electronically Signed   By: VEAR Hurst M.D.   On: 04/17/2024 04:20   DG Shoulder Right Portable Result Date: 04/17/2024 CLINICAL DATA:  Recent fall with right shoulder pain, initial encounter EXAM: RIGHT SHOULDER - 3 VIEW COMPARISON:  None Available. FINDINGS: No acute fracture or dislocation is noted. Underlying bony thorax appears within normal limits. No soft tissue abnormality is seen. IMPRESSION: No acute abnormality noted. Electronically Signed   By: Oneil Devonshire M.D.   On: 04/17/2024 04:00    Assessment/Plan: Leg hematoma, left, initial encounter  left calf hematoma sustained from a mechanical fall, request workup to rule out blood clot.  No pain  in the left calf on palpitated except to right around hematoma or with the left foot dorsiflexion, no increased swelling/warmth. Up to date CBC/differential, CMP/GFR, venous ultrasound left leg  Hyponatremia fluid restriction, combination SIADH(urine osmolality 500s) and dehydration. Na 13 5 04/17/2024  Primary insomnia Managed, on Lorazepam   Hypertension Permissive blood pressure control,  on Amlodipine, Diltiazem . Bun/creat 14/0.71 on 04/17/2024   Atrial fibrillation (HCC) Heart rate seem controlled,  on Eliquis , declined increasing Diltiazem  ER from 180mg  to 240mg  daily. Bp/P daily for now 04/19/24 Na 133, K 3.8, Bun 18, creat 0.53, wbc 6.7, Hgb 11.1, plt 126, neutrophils 71.6%  Hypothyroidism TSH 2.79 03/29/24, on Levothyroxine .   Mixed hyperlipidemia on Atorvastatin , LDL 73 02/18/24    Family/ staff Communication: Plan of care reviewed with the patient and charge nurse  Labs/tests ordered: CBC/differential, CMP/GFR, venous ultrasound left leg

## 2024-04-20 ENCOUNTER — Telehealth: Payer: Self-pay | Admitting: Internal Medicine

## 2024-04-20 ENCOUNTER — Encounter: Payer: Self-pay | Admitting: Sports Medicine

## 2024-04-20 ENCOUNTER — Encounter: Payer: Self-pay | Admitting: Internal Medicine

## 2024-04-20 DIAGNOSIS — R2689 Other abnormalities of gait and mobility: Secondary | ICD-10-CM | POA: Diagnosis not present

## 2024-04-20 DIAGNOSIS — R41841 Cognitive communication deficit: Secondary | ICD-10-CM | POA: Diagnosis not present

## 2024-04-20 DIAGNOSIS — R278 Other lack of coordination: Secondary | ICD-10-CM | POA: Diagnosis not present

## 2024-04-20 NOTE — Telephone Encounter (Signed)
-----   Message from Artist Pouch sent at 02/19/2024  5:48 PM EDT ----- Regarding: 6 month appt Patient currently has a recall for 6 month follow up. Please arrange 4 month follow up with APP or Dr. Mona.  Thanks,  Artist Pouch, PA-C

## 2024-04-20 NOTE — Telephone Encounter (Signed)
 Pt contacted 3x with no success, will send letter.

## 2024-04-21 DIAGNOSIS — R41841 Cognitive communication deficit: Secondary | ICD-10-CM | POA: Diagnosis not present

## 2024-04-21 DIAGNOSIS — R278 Other lack of coordination: Secondary | ICD-10-CM | POA: Diagnosis not present

## 2024-04-21 DIAGNOSIS — R2689 Other abnormalities of gait and mobility: Secondary | ICD-10-CM | POA: Diagnosis not present

## 2024-04-22 DIAGNOSIS — R278 Other lack of coordination: Secondary | ICD-10-CM | POA: Diagnosis not present

## 2024-04-22 DIAGNOSIS — R41841 Cognitive communication deficit: Secondary | ICD-10-CM | POA: Diagnosis not present

## 2024-04-22 DIAGNOSIS — R2689 Other abnormalities of gait and mobility: Secondary | ICD-10-CM | POA: Diagnosis not present

## 2024-04-25 DIAGNOSIS — R41841 Cognitive communication deficit: Secondary | ICD-10-CM | POA: Diagnosis not present

## 2024-04-25 DIAGNOSIS — R278 Other lack of coordination: Secondary | ICD-10-CM | POA: Diagnosis not present

## 2024-04-25 DIAGNOSIS — R2689 Other abnormalities of gait and mobility: Secondary | ICD-10-CM | POA: Diagnosis not present

## 2024-04-26 DIAGNOSIS — R41841 Cognitive communication deficit: Secondary | ICD-10-CM | POA: Diagnosis not present

## 2024-04-26 DIAGNOSIS — R278 Other lack of coordination: Secondary | ICD-10-CM | POA: Diagnosis not present

## 2024-04-26 DIAGNOSIS — R2689 Other abnormalities of gait and mobility: Secondary | ICD-10-CM | POA: Diagnosis not present

## 2024-04-27 DIAGNOSIS — R2689 Other abnormalities of gait and mobility: Secondary | ICD-10-CM | POA: Diagnosis not present

## 2024-04-27 DIAGNOSIS — R278 Other lack of coordination: Secondary | ICD-10-CM | POA: Diagnosis not present

## 2024-04-27 DIAGNOSIS — R41841 Cognitive communication deficit: Secondary | ICD-10-CM | POA: Diagnosis not present

## 2024-04-28 DIAGNOSIS — R41841 Cognitive communication deficit: Secondary | ICD-10-CM | POA: Diagnosis not present

## 2024-04-28 DIAGNOSIS — W19XXXA Unspecified fall, initial encounter: Secondary | ICD-10-CM | POA: Diagnosis not present

## 2024-04-28 DIAGNOSIS — R278 Other lack of coordination: Secondary | ICD-10-CM | POA: Diagnosis not present

## 2024-04-28 DIAGNOSIS — M79605 Pain in left leg: Secondary | ICD-10-CM | POA: Diagnosis not present

## 2024-04-28 DIAGNOSIS — R634 Abnormal weight loss: Secondary | ICD-10-CM | POA: Diagnosis not present

## 2024-04-28 DIAGNOSIS — R2689 Other abnormalities of gait and mobility: Secondary | ICD-10-CM | POA: Diagnosis not present

## 2024-04-28 LAB — BASIC METABOLIC PANEL WITH GFR
BUN: 9 (ref 4–21)
CO2: 30 — AB (ref 13–22)
Chloride: 96 — AB (ref 99–108)
Creatinine: 0.5 (ref 0.5–1.1)
Glucose: 91
Potassium: 3.8 meq/L (ref 3.5–5.1)
Sodium: 132 — AB (ref 137–147)

## 2024-04-28 LAB — COMPREHENSIVE METABOLIC PANEL WITH GFR
Calcium: 8.6 — AB (ref 8.7–10.7)
eGFR: 91

## 2024-04-29 DIAGNOSIS — R2689 Other abnormalities of gait and mobility: Secondary | ICD-10-CM | POA: Diagnosis not present

## 2024-04-29 DIAGNOSIS — R41841 Cognitive communication deficit: Secondary | ICD-10-CM | POA: Diagnosis not present

## 2024-04-29 DIAGNOSIS — R278 Other lack of coordination: Secondary | ICD-10-CM | POA: Diagnosis not present

## 2024-05-02 DIAGNOSIS — R278 Other lack of coordination: Secondary | ICD-10-CM | POA: Diagnosis not present

## 2024-05-02 DIAGNOSIS — R2689 Other abnormalities of gait and mobility: Secondary | ICD-10-CM | POA: Diagnosis not present

## 2024-05-02 DIAGNOSIS — R41841 Cognitive communication deficit: Secondary | ICD-10-CM | POA: Diagnosis not present

## 2024-05-03 DIAGNOSIS — R41841 Cognitive communication deficit: Secondary | ICD-10-CM | POA: Diagnosis not present

## 2024-05-03 DIAGNOSIS — R278 Other lack of coordination: Secondary | ICD-10-CM | POA: Diagnosis not present

## 2024-05-03 DIAGNOSIS — R2689 Other abnormalities of gait and mobility: Secondary | ICD-10-CM | POA: Diagnosis not present

## 2024-05-04 DIAGNOSIS — R2689 Other abnormalities of gait and mobility: Secondary | ICD-10-CM | POA: Diagnosis not present

## 2024-05-04 DIAGNOSIS — R278 Other lack of coordination: Secondary | ICD-10-CM | POA: Diagnosis not present

## 2024-05-04 DIAGNOSIS — I6339 Cerebral infarction due to thrombosis of other cerebral artery: Secondary | ICD-10-CM | POA: Diagnosis not present

## 2024-05-06 DIAGNOSIS — R278 Other lack of coordination: Secondary | ICD-10-CM | POA: Diagnosis not present

## 2024-05-06 DIAGNOSIS — R2689 Other abnormalities of gait and mobility: Secondary | ICD-10-CM | POA: Diagnosis not present

## 2024-05-06 DIAGNOSIS — I6339 Cerebral infarction due to thrombosis of other cerebral artery: Secondary | ICD-10-CM | POA: Diagnosis not present

## 2024-05-09 DIAGNOSIS — R2689 Other abnormalities of gait and mobility: Secondary | ICD-10-CM | POA: Diagnosis not present

## 2024-05-09 DIAGNOSIS — I6339 Cerebral infarction due to thrombosis of other cerebral artery: Secondary | ICD-10-CM | POA: Diagnosis not present

## 2024-05-09 DIAGNOSIS — R278 Other lack of coordination: Secondary | ICD-10-CM | POA: Diagnosis not present

## 2024-05-11 DIAGNOSIS — R2689 Other abnormalities of gait and mobility: Secondary | ICD-10-CM | POA: Diagnosis not present

## 2024-05-11 DIAGNOSIS — R278 Other lack of coordination: Secondary | ICD-10-CM | POA: Diagnosis not present

## 2024-05-11 DIAGNOSIS — I6339 Cerebral infarction due to thrombosis of other cerebral artery: Secondary | ICD-10-CM | POA: Diagnosis not present

## 2024-05-13 DIAGNOSIS — I6339 Cerebral infarction due to thrombosis of other cerebral artery: Secondary | ICD-10-CM | POA: Diagnosis not present

## 2024-05-13 DIAGNOSIS — R2689 Other abnormalities of gait and mobility: Secondary | ICD-10-CM | POA: Diagnosis not present

## 2024-05-13 DIAGNOSIS — R278 Other lack of coordination: Secondary | ICD-10-CM | POA: Diagnosis not present

## 2024-05-16 ENCOUNTER — Non-Acute Institutional Stay: Payer: Self-pay | Admitting: Nurse Practitioner

## 2024-05-16 ENCOUNTER — Encounter: Payer: Self-pay | Admitting: Nurse Practitioner

## 2024-05-16 DIAGNOSIS — E785 Hyperlipidemia, unspecified: Secondary | ICD-10-CM | POA: Diagnosis not present

## 2024-05-16 DIAGNOSIS — I480 Paroxysmal atrial fibrillation: Secondary | ICD-10-CM | POA: Diagnosis not present

## 2024-05-16 DIAGNOSIS — R2689 Other abnormalities of gait and mobility: Secondary | ICD-10-CM | POA: Diagnosis not present

## 2024-05-16 DIAGNOSIS — I10 Essential (primary) hypertension: Secondary | ICD-10-CM | POA: Diagnosis not present

## 2024-05-16 DIAGNOSIS — K5901 Slow transit constipation: Secondary | ICD-10-CM

## 2024-05-16 DIAGNOSIS — E039 Hypothyroidism, unspecified: Secondary | ICD-10-CM

## 2024-05-16 DIAGNOSIS — F411 Generalized anxiety disorder: Secondary | ICD-10-CM | POA: Diagnosis not present

## 2024-05-16 DIAGNOSIS — I6339 Cerebral infarction due to thrombosis of other cerebral artery: Secondary | ICD-10-CM | POA: Diagnosis not present

## 2024-05-16 DIAGNOSIS — E871 Hypo-osmolality and hyponatremia: Secondary | ICD-10-CM | POA: Diagnosis not present

## 2024-05-16 DIAGNOSIS — R278 Other lack of coordination: Secondary | ICD-10-CM | POA: Diagnosis not present

## 2024-05-16 NOTE — Assessment & Plan Note (Addendum)
 elevated Bp 170-180s occasionally, the patient declined headache, change of vision, chest discomfort, SOB, or GI symptoms. She attributed it to her anxiety.  Decline prn Clonidine  or Hydralazine .  Continue Amlodipine, Diltiazem 

## 2024-05-16 NOTE — Assessment & Plan Note (Signed)
 GAD/insomnia, on Lorazepam 

## 2024-05-16 NOTE — Assessment & Plan Note (Signed)
 Heart rate is in control, on Eliquis , Diltiazem (declined increasing dose for rapid heart beats 04/18/24)

## 2024-05-16 NOTE — Assessment & Plan Note (Signed)
 HLD/carotid artery stenosis, on Atorvastatin , LDL 73 02/18/24

## 2024-05-16 NOTE — Assessment & Plan Note (Signed)
 taking levothyroxine , TSH 2.79 03/29/2024

## 2024-05-16 NOTE — Assessment & Plan Note (Addendum)
 increased 1750/1500ml fluid restriction 04/22/24 combination SIADH(urine osmolality 500s) and dehydration. Na 13 5 04/17/2024,  Na 132 04/28/24 Update BMP

## 2024-05-16 NOTE — Progress Notes (Unsigned)
 Location:   [facility] Room Number: 913 Place of Service:  ALF (13) Provider: Larwance Ismeal Heider NP  Jerome Heron Ruth, PA-C  Patient Care Team: Jerome Heron Ruth DEVONNA as PCP - General (Physician Assistant) Cindie Ole DASEN, MD as PCP - Electrophysiology (Cardiology) Mona Vinie BROCKS, MD as PCP - Cardiology (Cardiology) Dann Candyce RAMAN, MD as Consulting Physician (Cardiology)  Extended Emergency Contact Information Primary Emergency Contact: Courtland Devere [place], [place] [address] [place] Home Phone: [phone] Mobile Phone: [phone] Relation: Niece  Code Status: DNR Goals of care: Advanced Directive information    04/18/2024   10:00 AM  Advanced Directives  Does Patient Have a Medical Advance Directive? No  Type of Advance Directive Healthcare Power of Attorney  Does patient want to make changes to medical advance directive? No - Patient declined  Copy of Healthcare Power of Attorney in Chart? Yes - validated most recent copy scanned in chart (See row information)     Chief Complaint  Patient presents with  . Acute Visit    Elevated blood pressure    HPI:  Pt is a 83 y.o. female seen today for an acute visit for elevated Bp 170-180s occasionally, the patient declined headache, change of vision, chest discomfort, SOB, or GI symptoms. She attributed it to her anxiety.   ED evaluation 04/17/2024 for scalp hematoma, unremarkable CMP, PT/INR, CBC with differential, x-ray of the right shoulder, CT head/cervical spine. Negative venous US  @ [facility]              CVA, right thalamus MRI brain,  with left sided weakness and dysarthria  hospitalized 01/14/24-01/18/24, f/u stroke clinic/neurology. On ASA, Atrovastatin, Eliquis (initially held ASA/Eliquis  due to micro hemorrhage within the lesion and chronic in the posterior left frontal lobe)                           Hyponatremia, increased 1750/1500ml fluid restriction 04/22/24 combination  SIADH(urine osmolality 500s) and dehydration. Na 13 5 04/17/2024, Na 132 04/28/24             GAD/insomnia, on Lorazepam              HTN, on Amlodipine, Diltiazem . Bun/creat 9/0.54 04/28/24             Afib, on Eliquis , Diltiazem (declined increasing dose for rapid heart beats 04/18/24)             Chronic cystitis/urinary frequency.  Hypothyroidism, taking levothyroxine , TSH 2.79 03/29/2024             HLD/carotid artery stenosis, on Atorvastatin , LDL 73 02/18/24             IBS/constipation, managed.                     Past Medical History:  Diagnosis Date  . Atrial fibrillation (HCC)    echo 12/23/10 EF= >55%, stress myoview 01/30/11 normal pattern of perfusion in all myocardial regions  . Carotid artery stenosis    Per [facility] new patient packet   . Chicken pox   . Colon polyp   . Colon polyp   . Dyslipidemia   . Heart murmur   . Hiatal hernia   . Hypertension   . Hypothyroidism   . Laryngopharyngeal reflux   . Osteopenia   . Scoliosis   . Seasonal allergies   . Thyroid  disease   . Vitamin D   deficiency    Past Surgical History:  Procedure Laterality Date  . BREAST CYST EXCISION Right 1988  . ESOPHAGEAL MANOMETRY N/A 08/29/2015   Procedure: ESOPHAGEAL MANOMETRY (EM);  Surgeon: Elspeth Deward Naval, MD;  Location: [facility];  Service: Gastroenterology;  Laterality: N/A;  . EYE SURGERY     Cataract eye surgery-right 06/2014, left 04/2014  . ORIF PATELLA Right 07/08/2022   Procedure: OPEN REDUCTION INTERNAL FIXATION (ORIF) PATELLA;  Surgeon: Josefina Chew, MD;  Location: [facility];  Service: Orthopedics;  Laterality: Right;  . TONSILLECTOMY     Childhood    Allergies  Allergen Reactions  . Amoxicillin Swelling  . Flonase [Fluticasone Propionate] Swelling    Swelling of throat per patient   . Sulfa Antibiotics Other (See Comments)    Upset stomach  . Sulfasalazine Other (See Comments)    Upset stomach    Allergies as of 05/16/2024       Reactions    Amoxicillin Swelling   Flonase [fluticasone Propionate] Swelling   Swelling of throat per patient   Sulfa Antibiotics Other (See Comments)   Upset stomach   Sulfasalazine Other (See Comments)   Upset stomach        Medication List        Accurate as of May 16, 2024  1:50 PM. If you have any questions, ask your nurse or doctor.          aspirin  EC 81 MG tablet Take 1 tablet (81 mg total) by mouth daily. Swallow whole.   atorvastatin  40 MG tablet Commonly known as: LIPITOR Take 1 tablet (40 mg total) by mouth daily.   BENEFIBER PO Take 0.5 Scoops by mouth as needed.   Cranberry 250-30 MG Tabs Take 2 tablets by mouth every evening.   diltiazem  180 MG 24 hr capsule Commonly known as: Cartia  XT Take 1 capsule (180 mg total) by mouth at bedtime.   Eliquis  2.5 MG Tabs tablet Generic drug: apixaban  Take 2.5 mg by mouth 2 (two) times daily.   GAS-X PO Take 1 tablet by mouth as needed.   lactulose  10 GM/15ML solution Commonly known as: CHRONULAC  Take 30 mLs by mouth daily.   levothyroxine  125 MCG tablet Commonly known as: SYNTHROID  TAKE 1 TABLET BY MOUTH DAILY BEFORE BREAKFAST.   LORazepam  0.5 MG tablet Commonly known as: ATIVAN  Take 1 tablet by mouth as needed for anxiety.   LORazepam  0.5 MG tablet Commonly known as: ATIVAN  Take 0.5 mg by mouth at bedtime.   Orajel 3X Toothache & Gum 20-0.26-0.15 % Gel Generic drug: Benzocaine-Menthol-Zinc Cl Use as directed in the mouth or throat as needed (Place and dissolve 1 application buccally as needed for mouth pain may keep at bedside).   PRESERVISION AREDS PO Take 1 tablet by mouth at bedtime.   MULTIVITAMIN GUMMIES ADULT PO Take 2 Pieces by mouth every evening.   Probiotic Chew Chew 1 each by mouth every evening.   Vitamin D -3 25 MCG (1000 UT) Caps Take 1,000 Units by mouth in the morning.        Review of Systems  Constitutional:  Negative for appetite change, fatigue and fever.  HENT:   Negative for congestion and trouble swallowing.   Eyes:  Negative for visual disturbance.  Respiratory:  Negative for cough and shortness of breath.   Cardiovascular:  Negative for chest pain, palpitations and leg swelling.  Gastrointestinal:  Negative for abdominal pain, constipation, nausea and vomiting.       Occasional, on diet.  Genitourinary:  Positive for frequency. Negative for difficulty urinating, dysuria and urgency.  Musculoskeletal:  Negative for back pain and gait problem.  Skin:  Negative for color change.  Neurological:  Positive for weakness. Negative for dizziness, speech difficulty and headaches.  Psychiatric/Behavioral:  Positive for sleep disturbance. Negative for behavioral problems. The patient is nervous/anxious.     Immunization History  Administered Date(s) Administered  . INFLUENZA, HIGH DOSE SEASONAL PF 06/05/2015, 06/13/2016, 06/15/2017, 05/31/2018, 05/16/2019, 05/16/2020, 06/03/2023  . Influenza,inj,Quad PF,6+ Mos 05/19/2014  . Influenza,inj,quad, With Preservative 05/31/2018  . Moderna Covid-19 Fall Seasonal Vaccine 68yrs & older 05/20/2023  . Moderna SARS-COV2 Booster Vaccination 06/12/2020  . Moderna Sars-Covid-2 Vaccination 08/08/2019, 09/05/2019  . PNEUMOCOCCAL CONJUGATE-20 04/16/2023  . Tdap 08/04/2010  . Zoster Recombinant(Shingrix) 05/13/2011   Pertinent  Health Maintenance Due  Topic Date Due  . Influenza Vaccine  03/04/2024  . DEXA SCAN  Discontinued      03/08/2020    9:13 AM 09/30/2020    3:25 PM 12/13/2020    3:25 PM 07/07/2022   11:36 AM 07/08/2022   10:43 AM  Fall Risk  Falls in the past year? 1  0    Was there an injury with Fall? 0  0    Fall Risk Category Calculator 1  0    Fall Risk Category (Retired) Low   Low     (RETIRED) Patient Fall Risk Level  Low fall risk  Low fall risk  Low fall risk  High fall risk      Data saved with a previous flowsheet row definition   Functional Status Survey:    Vitals:   05/16/24 1258  05/16/24 1321 05/16/24 1322  BP: (!) 180/90 (!) 143/80 (!) 165/84  Pulse:   82  Resp:   19  Temp:   98 F (36.7 C)  SpO2:   95%  Weight:   116 lb 12.8 oz (53 kg)   Body mass index is 19.44 kg/m. Physical Exam Vitals and nursing note reviewed.  Constitutional:      Appearance: Normal appearance.  HENT:     Head: Normocephalic and atraumatic.     Nose: Nose normal.     Mouth/Throat:     Mouth: Mucous membranes are moist.  Eyes:     Extraocular Movements: Extraocular movements intact.     Conjunctiva/sclera: Conjunctivae normal.     Pupils: Pupils are equal, round, and reactive to light.  Cardiovascular:     Rate and Rhythm: Normal rate. Rhythm irregular.     Heart sounds: Murmur heard.  Pulmonary:     Effort: Pulmonary effort is normal.     Breath sounds: No rales.  Abdominal:     General: Bowel sounds are normal.     Palpations: Abdomen is soft.     Tenderness: There is no abdominal tenderness.  Musculoskeletal:     Cervical back: Normal range of motion and neck supple.     Right lower leg: No edema.     Left lower leg: No edema.  Skin:    General: Skin is warm and dry.  Neurological:     General: No focal deficit present.     Mental Status: She is alert and oriented to person, place, and time. Mental status is at baseline.     Motor: Weakness present.     Coordination: Coordination normal.     Gait: Gait abnormal.     Deep Tendon Reflexes: Reflexes normal.     Comments: Left sided weakness  with muscle strength 5/5-no change  Psychiatric:        Mood and Affect: Mood normal.        Behavior: Behavior normal.        Thought Content: Thought content normal.        Judgment: Judgment normal.     Comments: Anxious     Labs reviewed: Recent Labs    01/02/24 2248 01/03/24 0214 01/14/24 1224 01/14/24 1226 01/16/24 1819 01/17/24 1129 01/18/24 0443 04/17/24 0353  NA  --    < > 129*   < > 127* 127* 128* 135  K  --    < > 3.9   < > 3.3* 4.1 3.8 3.5  CL  --     < > 96*   < > 91* 93* 95* 97*  CO2  --    < > 23   < > 25 25 25 28   GLUCOSE  --    < > 113*   < > 92 109* 88 119*  BUN  --    < > 19   < > 16 11 17 14   CREATININE  --    < > 0.89   < > 0.70 0.56 0.63 0.71  CALCIUM   --    < > 9.2   < > 8.7* 8.8* 8.5* 9.3  MG 1.9  --  1.9  --  1.8 2.0  --   --   PHOS 3.1  --   --   --   --   --   --   --    < > = values in this interval not displayed.   Recent Labs    01/14/24 1224 01/15/24 0543 02/18/24 0000 04/17/24 0353  AST 26 34 14 32  ALT 20 22 16 30   ALKPHOS 56 68 67 67  BILITOT 0.9 1.1  --  1.0  PROT 6.5 6.2*  --  6.2*  ALBUMIN 3.9 3.6 3.4* 3.6   Recent Labs    01/14/24 1224 01/14/24 1226 01/15/24 0543 01/16/24 1111 04/17/24 0353  WBC 5.4  --  5.9 6.7 9.8  NEUTROABS 3.9  --   --   --  7.3  HGB 13.6   < > 13.8 14.4 13.4  HCT 38.8   < > 39.1 40.5 40.0  MCV 93.9  --  93.5 92.0 99.5  PLT 153  --  165 154 149*   < > = values in this interval not displayed.   Lab Results  Component Value Date   TSH 5.952 (H) 01/14/2024   Lab Results  Component Value Date   HGBA1C 5.0 01/15/2024   Lab Results  Component Value Date   CHOL 158 02/18/2024   HDL 71 (A) 02/18/2024   LDLCALC 73 02/18/2024   LDLDIRECT 148.1 08/17/2013   TRIG 49 02/18/2024   CHOLHDL 2.3 01/15/2024    Significant Diagnostic Results in last 30 days:  CT Cervical Spine Wo Contrast Result Date: 04/17/2024 CLINICAL DATA:  83 year old female status post fall in bathroom striking head on toilet. On Eliquis . EXAM: CT CERVICAL SPINE WITHOUT CONTRAST TECHNIQUE: Multidetector CT imaging of the cervical spine was performed without intravenous contrast. Multiplanar CT image reconstructions were also generated. RADIATION DOSE REDUCTION: This exam was performed according to the departmental dose-optimization program which includes automated exposure control, adjustment of the mA and/or kV according to patient size and/or use of iterative reconstruction technique. COMPARISON:  Head  CT today.  CTA head and neck 01/14/2024. FINDINGS: Frequent  motion artifact on this exam. Alignment: Stable cervical lordosis. Mild chronic dextroconvex cervical scoliosis. Cervicothoracic junction alignment is within normal limits. Mild upper cervical degenerative appearing retrolisthesis is stable. Bilateral posterior element alignment is within normal limits. Skull base and vertebrae: Visualized skull base is intact. No atlanto-occipital dissociation. Congenital incomplete ossification of the posterior C1 ring, normal variant. C1 and C2 appear intact and aligned. No acute osseous abnormality identified. Soft tissues and spinal canal: No prevertebral fluid or swelling. No visible canal hematoma. Larynx and pharynx motion artifact. Calcified cervical carotid atherosclerosis. Tortuous right brachiocephalic artery in the right lower neck. Disc levels: Multifactorial chronic cervical spinal stenosis which appears at least moderate at C4-C5, mild-to-moderate at C3-C4. Upper chest: Tortuous aortic arch and proximal great vessels. Visible upper thoracic levels appear stable and intact. Negative lung apices. IMPRESSION: 1. No acute traumatic injury identified in the cervical spine when allowing for motion artifact. 2. 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 Electronically Signed   By: VEAR Hurst M.D.   On: 04/17/2024 04:24   CT Head Wo Contrast Result Date: 04/17/2024 CLINICAL DATA:  83 year old female status post fall in bathroom striking head on toilet. On Eliquis . EXAM: CT HEAD WITHOUT CONTRAST TECHNIQUE: Contiguous axial images were obtained from the base of the skull through the vertex without intravenous contrast. RADIATION DOSE REDUCTION: This exam was performed according to the departmental dose-optimization program which includes automated exposure control, adjustment of the mA and/or kV according to patient size and/or use of iterative reconstruction  technique. COMPARISON:  Brain MRI, Head CT 01/14/2024. FINDINGS: Brain: Stable cerebral volume. Expected evolution of lateral right thalamic lacunar infarct seen in June. No midline shift, ventriculomegaly, mass effect, evidence of mass lesion, intracranial hemorrhage or evidence of cortically based acute infarction. And otherwise stable gray-white differentiation, with mild to moderate for age patchy white matter hypodensity. Vascular: No suspicious intracranial vascular hyperdensity. Calcified atherosclerosis at the skull base. Skull: Stable. No acute fracture identified. 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 Sinuses/Orbits: Mild new mucosal thickening right maxillary sinus. Other Visualized paranasal sinuses and mastoids are clear. Other: Recurrent right posterior convexity broad-based scalp hematoma, now with laceration and small volume soft tissue gas there (series 7, image 40). Underlying calvarium appears stable and intact. Stable orbits soft tissues. IMPRESSION: 1. Right posterior convexity scalp hematoma and laceration. No skull fracture. 2. No acute intracranial abnormality. 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 Electronically Signed   By: VEAR Hurst M.D.   On: 04/17/2024 04:20   DG Shoulder Right Portable Result Date: 04/17/2024 CLINICAL DATA:  Recent fall with right shoulder pain, initial encounter EXAM: RIGHT SHOULDER - 3 VIEW COMPARISON:  None Available. FINDINGS: No acute fracture or dislocation is noted. Underlying bony thorax appears within normal limits. No soft tissue abnormality is seen. IMPRESSION: No acute abnormality noted. Electronically Signed   By: Oneil Devonshire M.D.   On: 04/17/2024 04:00    Assessment/Plan: Hypertension  elevated Bp 170-180s occasionally, the patient declined headache, change of vision, chest discomfort, SOB, or GI symptoms. She attributed it to her anxiety.  Decline prn Clonidine  or Hydralazine .   Continue Amlodipine, Diltiazem   Hyponatremia  increased 1750/1500ml fluid restriction 04/22/24 combination SIADH(urine osmolality 500s) and dehydration. Na 13 5 04/17/2024,  Na 132 04/28/24 Update BMP  Generalized anxiety disorder   GAD/insomnia, on Lorazepam   Atrial fibrillation (HCC) Heart rate is in control, on Eliquis , Diltiazem (declined increasing dose for rapid heart  beats 04/18/24)  Hypothyroidism  taking levothyroxine , TSH 2.79 03/29/2024              Dyslipidemia HLD/carotid artery stenosis, on Atorvastatin , LDL 73 02/18/24  Slow transit constipation IBS/constipation, managed.  Last BM today.                  Family/ staff Communication: plan of care reviewed with the patient and charge nurse.   Labs/tests ordered:  BMP

## 2024-05-16 NOTE — Assessment & Plan Note (Signed)
 IBS/constipation, managed.  Last BM today.

## 2024-05-17 DIAGNOSIS — R634 Abnormal weight loss: Secondary | ICD-10-CM | POA: Diagnosis not present

## 2024-05-17 DIAGNOSIS — R278 Other lack of coordination: Secondary | ICD-10-CM | POA: Diagnosis not present

## 2024-05-17 DIAGNOSIS — I6339 Cerebral infarction due to thrombosis of other cerebral artery: Secondary | ICD-10-CM | POA: Diagnosis not present

## 2024-05-17 DIAGNOSIS — M6281 Muscle weakness (generalized): Secondary | ICD-10-CM | POA: Diagnosis not present

## 2024-05-17 DIAGNOSIS — R296 Repeated falls: Secondary | ICD-10-CM | POA: Diagnosis not present

## 2024-05-17 LAB — BASIC METABOLIC PANEL WITH GFR
BUN: 12 (ref 4–21)
CO2: 31 — AB (ref 13–22)
Chloride: 96 — AB (ref 99–108)
Creatinine: 0.6 (ref 0.5–1.1)
Glucose: 83
Potassium: 3.6 meq/L (ref 3.5–5.1)
Sodium: 133 — AB (ref 137–147)

## 2024-05-17 LAB — COMPREHENSIVE METABOLIC PANEL WITH GFR
Calcium: 9 (ref 8.7–10.7)
eGFR: 87

## 2024-05-18 DIAGNOSIS — I6339 Cerebral infarction due to thrombosis of other cerebral artery: Secondary | ICD-10-CM | POA: Diagnosis not present

## 2024-05-18 DIAGNOSIS — R278 Other lack of coordination: Secondary | ICD-10-CM | POA: Diagnosis not present

## 2024-05-18 DIAGNOSIS — R2689 Other abnormalities of gait and mobility: Secondary | ICD-10-CM | POA: Diagnosis not present

## 2024-05-19 DIAGNOSIS — R296 Repeated falls: Secondary | ICD-10-CM | POA: Diagnosis not present

## 2024-05-19 DIAGNOSIS — M6281 Muscle weakness (generalized): Secondary | ICD-10-CM | POA: Diagnosis not present

## 2024-05-19 DIAGNOSIS — I6339 Cerebral infarction due to thrombosis of other cerebral artery: Secondary | ICD-10-CM | POA: Diagnosis not present

## 2024-05-19 DIAGNOSIS — R278 Other lack of coordination: Secondary | ICD-10-CM | POA: Diagnosis not present

## 2024-05-20 DIAGNOSIS — I6339 Cerebral infarction due to thrombosis of other cerebral artery: Secondary | ICD-10-CM | POA: Diagnosis not present

## 2024-05-20 DIAGNOSIS — R2689 Other abnormalities of gait and mobility: Secondary | ICD-10-CM | POA: Diagnosis not present

## 2024-05-20 DIAGNOSIS — R278 Other lack of coordination: Secondary | ICD-10-CM | POA: Diagnosis not present

## 2024-05-23 DIAGNOSIS — I6339 Cerebral infarction due to thrombosis of other cerebral artery: Secondary | ICD-10-CM | POA: Diagnosis not present

## 2024-05-23 DIAGNOSIS — R278 Other lack of coordination: Secondary | ICD-10-CM | POA: Diagnosis not present

## 2024-05-23 DIAGNOSIS — R2689 Other abnormalities of gait and mobility: Secondary | ICD-10-CM | POA: Diagnosis not present

## 2024-05-25 ENCOUNTER — Other Ambulatory Visit: Payer: Self-pay | Admitting: Adult Health

## 2024-05-25 DIAGNOSIS — I6339 Cerebral infarction due to thrombosis of other cerebral artery: Secondary | ICD-10-CM | POA: Diagnosis not present

## 2024-05-25 DIAGNOSIS — F411 Generalized anxiety disorder: Secondary | ICD-10-CM

## 2024-05-25 DIAGNOSIS — R278 Other lack of coordination: Secondary | ICD-10-CM | POA: Diagnosis not present

## 2024-05-25 DIAGNOSIS — R2689 Other abnormalities of gait and mobility: Secondary | ICD-10-CM | POA: Diagnosis not present

## 2024-05-25 MED ORDER — LORAZEPAM 0.5 MG PO TABS: 0.5000 mg | ORAL_TABLET | Freq: Every day | ORAL | 0 refills | Status: DC

## 2024-05-27 DIAGNOSIS — R278 Other lack of coordination: Secondary | ICD-10-CM | POA: Diagnosis not present

## 2024-05-27 DIAGNOSIS — R2689 Other abnormalities of gait and mobility: Secondary | ICD-10-CM | POA: Diagnosis not present

## 2024-05-27 DIAGNOSIS — I6339 Cerebral infarction due to thrombosis of other cerebral artery: Secondary | ICD-10-CM | POA: Diagnosis not present

## 2024-05-30 DIAGNOSIS — R2689 Other abnormalities of gait and mobility: Secondary | ICD-10-CM | POA: Diagnosis not present

## 2024-05-30 DIAGNOSIS — I6339 Cerebral infarction due to thrombosis of other cerebral artery: Secondary | ICD-10-CM | POA: Diagnosis not present

## 2024-05-30 DIAGNOSIS — R278 Other lack of coordination: Secondary | ICD-10-CM | POA: Diagnosis not present

## 2024-06-01 DIAGNOSIS — R2689 Other abnormalities of gait and mobility: Secondary | ICD-10-CM | POA: Diagnosis not present

## 2024-06-01 DIAGNOSIS — R278 Other lack of coordination: Secondary | ICD-10-CM | POA: Diagnosis not present

## 2024-06-01 DIAGNOSIS — I6339 Cerebral infarction due to thrombosis of other cerebral artery: Secondary | ICD-10-CM | POA: Diagnosis not present

## 2024-06-03 DIAGNOSIS — R2689 Other abnormalities of gait and mobility: Secondary | ICD-10-CM | POA: Diagnosis not present

## 2024-06-03 DIAGNOSIS — I6339 Cerebral infarction due to thrombosis of other cerebral artery: Secondary | ICD-10-CM | POA: Diagnosis not present

## 2024-06-03 DIAGNOSIS — R278 Other lack of coordination: Secondary | ICD-10-CM | POA: Diagnosis not present

## 2024-06-06 DIAGNOSIS — R278 Other lack of coordination: Secondary | ICD-10-CM | POA: Diagnosis not present

## 2024-06-06 DIAGNOSIS — R2689 Other abnormalities of gait and mobility: Secondary | ICD-10-CM | POA: Diagnosis not present

## 2024-06-06 DIAGNOSIS — I6339 Cerebral infarction due to thrombosis of other cerebral artery: Secondary | ICD-10-CM | POA: Diagnosis not present

## 2024-06-07 DIAGNOSIS — I6339 Cerebral infarction due to thrombosis of other cerebral artery: Secondary | ICD-10-CM | POA: Diagnosis not present

## 2024-06-07 DIAGNOSIS — R278 Other lack of coordination: Secondary | ICD-10-CM | POA: Diagnosis not present

## 2024-06-07 DIAGNOSIS — R296 Repeated falls: Secondary | ICD-10-CM | POA: Diagnosis not present

## 2024-06-07 DIAGNOSIS — M6281 Muscle weakness (generalized): Secondary | ICD-10-CM | POA: Diagnosis not present

## 2024-06-08 DIAGNOSIS — R2689 Other abnormalities of gait and mobility: Secondary | ICD-10-CM | POA: Diagnosis not present

## 2024-06-08 DIAGNOSIS — I6339 Cerebral infarction due to thrombosis of other cerebral artery: Secondary | ICD-10-CM | POA: Diagnosis not present

## 2024-06-08 DIAGNOSIS — R278 Other lack of coordination: Secondary | ICD-10-CM | POA: Diagnosis not present

## 2024-06-09 DIAGNOSIS — R296 Repeated falls: Secondary | ICD-10-CM | POA: Diagnosis not present

## 2024-06-09 DIAGNOSIS — I6339 Cerebral infarction due to thrombosis of other cerebral artery: Secondary | ICD-10-CM | POA: Diagnosis not present

## 2024-06-09 DIAGNOSIS — R278 Other lack of coordination: Secondary | ICD-10-CM | POA: Diagnosis not present

## 2024-06-09 DIAGNOSIS — M6281 Muscle weakness (generalized): Secondary | ICD-10-CM | POA: Diagnosis not present

## 2024-06-10 DIAGNOSIS — I6339 Cerebral infarction due to thrombosis of other cerebral artery: Secondary | ICD-10-CM | POA: Diagnosis not present

## 2024-06-10 DIAGNOSIS — R2689 Other abnormalities of gait and mobility: Secondary | ICD-10-CM | POA: Diagnosis not present

## 2024-06-10 DIAGNOSIS — R278 Other lack of coordination: Secondary | ICD-10-CM | POA: Diagnosis not present

## 2024-06-13 DIAGNOSIS — I6339 Cerebral infarction due to thrombosis of other cerebral artery: Secondary | ICD-10-CM | POA: Diagnosis not present

## 2024-06-13 DIAGNOSIS — R278 Other lack of coordination: Secondary | ICD-10-CM | POA: Diagnosis not present

## 2024-06-13 DIAGNOSIS — R2689 Other abnormalities of gait and mobility: Secondary | ICD-10-CM | POA: Diagnosis not present

## 2024-06-14 ENCOUNTER — Non-Acute Institutional Stay: Payer: Self-pay | Admitting: Nurse Practitioner

## 2024-06-14 ENCOUNTER — Encounter: Payer: Self-pay | Admitting: Nurse Practitioner

## 2024-06-14 DIAGNOSIS — Z8673 Personal history of transient ischemic attack (TIA), and cerebral infarction without residual deficits: Secondary | ICD-10-CM | POA: Diagnosis not present

## 2024-06-14 DIAGNOSIS — F411 Generalized anxiety disorder: Secondary | ICD-10-CM | POA: Diagnosis not present

## 2024-06-14 DIAGNOSIS — I10 Essential (primary) hypertension: Secondary | ICD-10-CM | POA: Diagnosis not present

## 2024-06-14 DIAGNOSIS — K5901 Slow transit constipation: Secondary | ICD-10-CM | POA: Diagnosis not present

## 2024-06-14 DIAGNOSIS — R350 Frequency of micturition: Secondary | ICD-10-CM

## 2024-06-14 DIAGNOSIS — E785 Hyperlipidemia, unspecified: Secondary | ICD-10-CM | POA: Diagnosis not present

## 2024-06-14 DIAGNOSIS — E871 Hypo-osmolality and hyponatremia: Secondary | ICD-10-CM | POA: Diagnosis not present

## 2024-06-14 DIAGNOSIS — E039 Hypothyroidism, unspecified: Secondary | ICD-10-CM | POA: Diagnosis not present

## 2024-06-14 DIAGNOSIS — I480 Paroxysmal atrial fibrillation: Secondary | ICD-10-CM | POA: Diagnosis not present

## 2024-06-14 MED ORDER — DILTIAZEM HCL ER COATED BEADS 180 MG PO CP24: 180.0000 mg | ORAL_CAPSULE | Freq: Every day | ORAL | 0 refills | Status: DC

## 2024-06-14 MED ORDER — ELIQUIS 2.5 MG PO TABS: 2.5000 mg | ORAL_TABLET | Freq: Two times a day (BID) | ORAL | 0 refills | Status: DC

## 2024-06-14 MED ORDER — LEVOTHYROXINE SODIUM 125 MCG PO TABS: 125.0000 ug | ORAL_TABLET | Freq: Every day | ORAL | 0 refills | Status: DC

## 2024-06-14 MED ORDER — LORAZEPAM 0.5 MG PO TABS
0.5000 mg | ORAL_TABLET | Freq: Every day | ORAL | 0 refills | Status: AC
Start: 2024-06-14 — End: ?

## 2024-06-14 MED ORDER — ATORVASTATIN CALCIUM 40 MG PO TABS: 40.0000 mg | ORAL_TABLET | Freq: Every day | ORAL | 0 refills | Status: DC

## 2024-06-14 NOTE — Assessment & Plan Note (Signed)
 HLD/carotid artery stenosis, on Atorvastatin , LDL 73 02/18/24

## 2024-06-14 NOTE — Assessment & Plan Note (Signed)
 GAD/insomnia, on Lorazepam 

## 2024-06-14 NOTE — Assessment & Plan Note (Signed)
 Chronic cystitis/urinary frequency.

## 2024-06-14 NOTE — Assessment & Plan Note (Addendum)
 Permissive blood pressure control, on Amlodipine, Diltiazem . Bun/creat 12/0.6 05/17/24

## 2024-06-14 NOTE — Assessment & Plan Note (Signed)
 IBS, diet controlled.

## 2024-06-14 NOTE — Assessment & Plan Note (Signed)
 taking levothyroxine , TSH 2.79 03/29/24

## 2024-06-14 NOTE — Assessment & Plan Note (Signed)
 increased 1750/1500ml fluid restriction 04/22/24 combination SIADH(urine osmolality 500s) and dehydration. Na 13 5 04/17/2024, Na 132 04/28/24, Na 133 05/17/24

## 2024-06-14 NOTE — Assessment & Plan Note (Addendum)
 Heart rate is in control on Eliquis , Diltiazem (declined increasing dose for rapid heart beats 04/18/24)

## 2024-06-14 NOTE — Assessment & Plan Note (Signed)
 Sided weakness with muscle strength of 5 out of 5 Wheelchair for mobility except using walker in her room Continue Eliquis , atorvastatin , ASA

## 2024-06-14 NOTE — Progress Notes (Signed)
 Location:  [facility] Room Number: [place] [id number]-A Place of Service:  [phone] [phone])  Provider: Larwance Hark, N.P.  PCP: Jerome Heron Ruth, PA-C Patient Care Team: Jerome Heron Ruth Stacy [name] as PCP - General (Physician Assistant) Cindie Ole DASEN, MD as PCP - Electrophysiology (Cardiology) Mona Vinie BROCKS, MD as PCP - Cardiology (Cardiology) Dann Candyce RAMAN, MD as Consulting Physician (Cardiology)  Extended Emergency Contact Information Primary Emergency Contact: Courtland Devere PERSONS, [place] [address] [place] Home Phone: [phone] Mobile Phone: [phone] Relation: Niece  Code Status: Full Code  Goals of care:  Advanced Directive information    04/18/2024   10:00 AM  Advanced Directives  Does Patient Have a Medical Advance Directive? No  Type of Advance Directive Healthcare Power of Attorney  Does patient want to make changes to medical advance directive? No - Patient declined  Copy of Healthcare Power of Attorney in Chart? Yes - validated most recent copy scanned in chart (See row information)     Allergies  Allergen Reactions  . Amoxicillin Swelling  . Flonase [Fluticasone Propionate] Swelling    Swelling of throat per patient   . Sulfa Antibiotics Other (See Comments)    Upset stomach  . Sulfasalazine Other (See Comments)    Upset stomach    Chief Complaint  Patient presents with  . Discharge Note    Discharge from Assisted Living to Independent Living     HPI:  83 y.o. female with PMH significant for stroke, hyponatremia, GAD/insomnia, HTN, A-fib, hypothyroidism, HLD, IBS/constipation was admitted to [facility] for therapy following hospital stay for stroke June 2025.  The patient has regained her physical strength, mobility, and ADL function, progressed from SNF to assisted living to an independent living level of care.  Her assisted living stay was complicated with a fall with a resultant off scalp hematoma,  elevated blood pressure, and A-fib with RVR.  ED evaluation 04/17/2024 for scalp hematoma, unremarkable CMP, PT/INR, CBC with differential, x-ray of the right shoulder, CT head/cervical spine. Negative venous US  @ [facility]               CVA, right thalamus MRI brain, with left sided weakness and dysarthria hospitalized 01/14/24-01/18/24, f/u stroke clinic/neurology. On ASA, Atrovastatin, Eliquis (initially held ASA/Eliquis  due to micro hemorrhage within the lesion and chronic in the posterior left frontal lobe)  Hyponatremia, increased 1750/1500ml fluid restriction 04/22/24 combination SIADH(urine osmolality 500s) and dehydration. Na 13 5 04/17/2024, Na 132 04/28/24, Na 133 05/17/24             GAD/insomnia, on Lorazepam              HTN, on Amlodipine, Diltiazem . Bun/creat 12/0.6 05/17/24             Afib, on Eliquis , Diltiazem (declined increasing dose for rapid heart beats 04/18/24)             Chronic cystitis/urinary frequency.  Hypothyroidism, taking levothyroxine , TSH 2.79 03/29/2024             HLD/carotid artery stenosis, on Atorvastatin , LDL 73 02/18/24             IBS/constipation, managed.                  Past Medical History:  Diagnosis Date  . Atrial fibrillation (HCC)    echo 12/23/10 EF= >55%, stress myoview 01/30/11 normal pattern of perfusion in all myocardial regions  . Carotid artery  stenosis    Per [facility] new patient packet   . Chicken pox   . Colon polyp   . Colon polyp   . Dyslipidemia   . Heart murmur   . Hiatal hernia   . Hypertension   . Hypothyroidism   . Laryngopharyngeal reflux   . Osteopenia   . Scoliosis   . Seasonal allergies   . Thyroid  disease   . Vitamin D  deficiency     Past Surgical History:  Procedure Laterality Date  . BREAST CYST EXCISION Right 1988  . ESOPHAGEAL MANOMETRY N/A 08/29/2015   Procedure: ESOPHAGEAL MANOMETRY (EM);  Surgeon: Elspeth Deward Naval, MD;  Location: [facility];  Service: Gastroenterology;  Laterality: N/A;  . EYE SURGERY      Cataract eye surgery-right 06/2014, left 04/2014  . ORIF PATELLA Right 07/08/2022   Procedure: OPEN REDUCTION INTERNAL FIXATION (ORIF) PATELLA;  Surgeon: Josefina Chew, MD;  Location:  SURGERY CENTER;  Service: Orthopedics;  Laterality: Right;  . TONSILLECTOMY     Childhood      reports that she has never smoked. She has never used smokeless tobacco. She reports that she does not currently use alcohol after a past usage of about 1.0 standard drink of alcohol per week. She reports that she does not use drugs. Social History   Socioeconomic History  . Marital status: Single    Spouse name: Not on file  . Number of children: Not on file  . Years of education: Not on file  . Highest education level: Not on file  Occupational History  . Occupation: retired  Tobacco Use  . Smoking status: Never  . Smokeless tobacco: Never  Substance and Sexual Activity  . Alcohol use: Not Currently    Alcohol/week: 1.0 standard drink of alcohol    Types: 1 Glasses of wine per week    Comment: 1 glass of wine 3x a week  . Drug use: No  . Sexual activity: Not on file  Other Topics Concern  . Not on file  Social History Narrative   Work or School: clinical research associate - poetry      Home Situation: lives alone      Spiritual Beliefs: Jewish      Lifestyle[facility]; diet healthy      Per [facility] New Patient Packet:      Diet: Heart healthy, low-fat      Caffeine: 1 cup of tea daily       Married, if yes what year: Single      Do you live in a house, apartment, assisted living, condo, trailer, ect: Apartment, 1 person, 7 stories       Pets: No      Current/Past profession: PHD, Child Psychotherapist      Exercise: Yes, Treadmill, weights, and walking          Living Will: Yes   DNR: Yes   POA/HPOA: Yes      Functional Status:   Do you have difficulty bathing or dressing yourself? No   Do you have difficulty preparing food or eating? No   Do you have difficulty managing your  medications? No   Do you have difficulty managing your finances? No   Do you have difficulty affording your medications? No         Social Drivers of Corporate Investment Banker Strain: Not on file  Food Insecurity: No Food Insecurity (01/14/2024)   Hunger Vital Sign   .  Worried About Programme Researcher, Broadcasting/film/video in the Last Year: Never true   . Ran Out of Food in the Last Year: Never true  Transportation Needs: No Transportation Needs (01/14/2024)   PRAPARE - Transportation   . Lack of Transportation (Medical): No   . Lack of Transportation (Non-Medical): No  Physical Activity: Not on file  Stress: Not on file  Social Connections: Unknown (01/14/2024)   Social Connection and Isolation Panel   . Frequency of Communication with Friends and Family: More than three times a week   . Frequency of Social Gatherings with Friends and Family: Three times a week   . Attends Religious Services: 1 to 4 times per year   . Active Member of Clubs or Organizations: Yes   . Attends Banker Meetings: More than 4 times per year   . Marital Status: Not on file  Intimate Partner Violence: Not At Risk (01/14/2024)   Humiliation, Afraid, Rape, and Kick questionnaire   . Fear of Current or Ex-Partner: No   . Emotionally Abused: No   . Physically Abused: No   . Sexually Abused: No   Functional Status Survey:    Allergies  Allergen Reactions  . Amoxicillin Swelling  . Flonase [Fluticasone Propionate] Swelling    Swelling of throat per patient   . Sulfa Antibiotics Other (See Comments)    Upset stomach  . Sulfasalazine Other (See Comments)    Upset stomach    Pertinent  Health Maintenance Due  Topic Date Due  . Influenza Vaccine  Completed  . DEXA SCAN  Discontinued    Medications: Outpatient Encounter Medications as of 06/14/2024  Medication Sig  . aspirin  EC 81 MG tablet Take 1 tablet (81 mg total) by mouth daily. Swallow whole.  . Benzocaine-Menthol-Zinc Cl (ORAJEL 3X TOOTHACHE &  GUM) 20-0.26-0.15 % GEL Use as directed in the mouth or throat as needed (Place and dissolve 1 application buccally as needed for mouth pain may keep at bedside).  . Cholecalciferol  (VITAMIN D -3) 1000 UNITS CAPS Take 1,000 Units by mouth in the morning.  . Cranberry 250-30 MG TABS Take 2 tablets by mouth every evening.  . lactulose  (CHRONULAC ) 10 GM/15ML solution Take 30 mLs by mouth daily.  . LORazepam  (ATIVAN ) 0.5 MG tablet Take 1 tablet by mouth as needed for anxiety.  . Multiple Vitamins-Minerals (MULTIVITAMIN GUMMIES ADULT PO) Take 2 Pieces by mouth every evening.  . Multiple Vitamins-Minerals (PRESERVISION AREDS PO) Take 1 tablet by mouth at bedtime.  . polyethylene glycol (MIRALAX / GLYCOLAX) 17 g packet Take 17 g by mouth daily as needed (bowel management).  . Probiotic CHEW Chew 1 each by mouth every evening.  . Simethicone (GAS-X PO) Take 1 tablet by mouth as needed.  . Wheat Dextrin (BENEFIBER PO) Take 0.5 Scoops by mouth as needed.  . [DISCONTINUED] atorvastatin  (LIPITOR) 40 MG tablet Take 1 tablet (40 mg total) by mouth daily.  . [DISCONTINUED] diltiazem  (CARTIA  XT) 180 MG 24 hr capsule Take 1 capsule (180 mg total) by mouth at bedtime.  . [DISCONTINUED] ELIQUIS  2.5 MG TABS tablet Take 2.5 mg by mouth 2 (two) times daily.  . [DISCONTINUED] levothyroxine  (SYNTHROID ) 125 MCG tablet TAKE 1 TABLET BY MOUTH DAILY BEFORE BREAKFAST.  . [DISCONTINUED] LORazepam  (ATIVAN ) 0.5 MG tablet Take 1 tablet (0.5 mg total) by mouth at bedtime.  . atorvastatin  (LIPITOR) 40 MG tablet Take 1 tablet (40 mg total) by mouth daily.  . diltiazem  (CARTIA  XT) 180 MG 24 hr capsule Take  1 capsule (180 mg total) by mouth at bedtime.  . ELIQUIS  2.5 MG TABS tablet Take 1 tablet (2.5 mg total) by mouth 2 (two) times daily.  . levothyroxine  (SYNTHROID ) 125 MCG tablet Take 1 tablet (125 mcg total) by mouth daily before breakfast.  . LORazepam  (ATIVAN ) 0.5 MG tablet Take 1 tablet (0.5 mg total) by mouth at bedtime.    No facility-administered encounter medications on file as of 06/14/2024.    Review of Systems  Constitutional:  Negative for appetite change, fatigue and fever.  HENT:  Negative for congestion and trouble swallowing.   Eyes:  Negative for visual disturbance.  Respiratory:  Negative for cough and shortness of breath.   Cardiovascular:  Negative for chest pain, palpitations and leg swelling.  Gastrointestinal:  Negative for abdominal pain, constipation, nausea and vomiting.       Occasional, on diet.   Genitourinary:  Positive for frequency. Negative for difficulty urinating, dysuria and urgency.  Musculoskeletal:  Negative for back pain and gait problem.  Skin:  Negative for color change.  Neurological:  Positive for weakness. Negative for dizziness, speech difficulty and headaches.  Psychiatric/Behavioral:  Positive for sleep disturbance. Negative for behavioral problems. The patient is nervous/anxious.     Vitals:   06/14/24 0922 06/21/24 1619  BP: (!) 157/80 (!) 140/81  Pulse: 80   Temp: 97.7 F (36.5 C)   SpO2: 97%   Weight: 111 lb 6.4 oz (50.5 kg)   Height: 5' 5 (1.651 m)    Body mass index is 18.54 kg/m. Physical Exam Vitals and nursing note reviewed.  Constitutional:      Appearance: Normal appearance.  HENT:     Head: Normocephalic and atraumatic.     Nose: Nose normal.     Mouth/Throat:     Mouth: Mucous membranes are moist.  Eyes:     Extraocular Movements: Extraocular movements intact.     Conjunctiva/sclera: Conjunctivae normal.     Pupils: Pupils are equal, round, and reactive to light.  Cardiovascular:     Rate and Rhythm: Normal rate. Rhythm irregular.     Heart sounds: Murmur heard.  Pulmonary:     Effort: Pulmonary effort is normal.     Breath sounds: No rales.  Abdominal:     General: Bowel sounds are normal.     Palpations: Abdomen is soft.     Tenderness: There is no abdominal tenderness.  Musculoskeletal:     Cervical back: Normal range  of motion and neck supple.     Right lower leg: No edema.     Left lower leg: No edema.  Skin:    General: Skin is warm and dry.  Neurological:     General: No focal deficit present.     Mental Status: She is alert and oriented to person, place, and time. Mental status is at baseline.     Motor: Weakness present.     Coordination: Coordination normal.     Gait: Gait abnormal.     Deep Tendon Reflexes: Reflexes normal.     Comments: Left sided weakness with muscle strength 5/5-no change  Psychiatric:        Mood and Affect: Mood normal.        Behavior: Behavior normal.        Thought Content: Thought content normal.        Judgment: Judgment normal.     Comments: Anxious     Labs reviewed: Basic Metabolic Panel: Recent Labs    01/02/24  2248 01/03/24 0214 01/14/24 1224 01/14/24 1226 01/16/24 1819 01/17/24 1129 01/18/24 0443 04/17/24 0353 04/28/24 0000 05/17/24 0000  NA  --    < > 129*   < > 127* 127* 128* 135 132* 133*  K  --    < > 3.9   < > 3.3* 4.1 3.8 3.5 3.8 3.6  CL  --    < > 96*   < > 91* 93* 95* 97* 96* 96*  CO2  --    < > 23   < > 25 25 25 28  30* 31*  GLUCOSE  --    < > 113*   < > 92 109* 88 119*  --   --   BUN  --    < > 19   < > 16 11 17 14 9 12   CREATININE  --    < > 0.89   < > 0.70 0.56 0.63 0.71 0.5 0.6  CALCIUM   --    < > 9.2   < > 8.7* 8.8* 8.5* 9.3 8.6* 9.0  MG 1.9  --  1.9  --  1.8 2.0  --   --   --   --   PHOS 3.1  --   --   --   --   --   --   --   --   --    < > = values in this interval not displayed.   Liver Function Tests: Recent Labs    01/14/24 1224 01/15/24 0543 02/18/24 0000 04/17/24 0353  AST 26 34 14 32  ALT 20 22 16 30   ALKPHOS 56 68 67 67  BILITOT 0.9 1.1  --  1.0  PROT 6.5 6.2*  --  6.2*  ALBUMIN 3.9 3.6 3.4* 3.6   No results for input(s): LIPASE, AMYLASE in the last 8760 hours. No results for input(s): AMMONIA in the last 8760 hours. CBC: Recent Labs    01/14/24 1224 01/14/24 1226 01/15/24 0543 01/16/24 1111  04/17/24 0353  WBC 5.4  --  5.9 6.7 9.8  NEUTROABS 3.9  --   --   --  7.3  HGB 13.6   < > 13.8 14.4 13.4  HCT 38.8   < > 39.1 40.5 40.0  MCV 93.9  --  93.5 92.0 99.5  PLT 153  --  165 154 149*   < > = values in this interval not displayed.   Cardiac Enzymes: No results for input(s): CKTOTAL, CKMB, CKMBINDEX, TROPONINI in the last 8760 hours. BNP: Invalid input(s): POCBNP CBG: Recent Labs    01/02/24 1241 01/14/24 1220  GLUCAP 110* 120*    Procedures and Imaging Studies During Stay: No results found.  Assessment/Plan:   Atrial fibrillation (HCC) Heart rate is in control on Eliquis , Diltiazem (declined increasing dose for rapid heart beats 04/18/24)  Urinary frequency  Chronic cystitis/urinary frequency.   Hypothyroidism taking levothyroxine , TSH 2.79 03/29/24  Dyslipidemia HLD/carotid artery stenosis, on Atorvastatin , LDL 73 02/18/24  Slow transit constipation IBS, diet controlled.   Hypertension Permissive blood pressure control, on Amlodipine, Diltiazem . Bun/creat 12/0.6 05/17/24  Generalized anxiety disorder GAD/insomnia, on Lorazepam   Hyponatremia increased 1750/1500ml fluid restriction 04/22/24 combination SIADH(urine osmolality 500s) and dehydration. Na 13 5 04/17/2024, Na 132 04/28/24, Na 133 05/17/24  History of stroke Sided weakness with muscle strength of 5 out of 5 Wheelchair for mobility except using walker in her room Continue Eliquis , atorvastatin , ASA     Patient is being discharged with the following home  health services:    Patient is being discharged with the following durable medical equipment:    Patient has been advised to f/u with their PCP in 1-2 weeks to for a transitions of care visit.  Social services at their facility was responsible for arranging this appointment.  Pt was provided with adequate prescriptions of noncontrolled medications to reach the scheduled appointment .  For controlled substances, a limited supply was  provided as appropriate for the individual patient.  If the pt normally receives these medications from a pain clinic or has a contract with another physician, these medications should be received from that clinic or physician only).    Future labs/tests needed:  prn  F/u in clinic [facility] 2-3 weeks.

## 2024-06-15 DIAGNOSIS — R2689 Other abnormalities of gait and mobility: Secondary | ICD-10-CM | POA: Diagnosis not present

## 2024-06-15 DIAGNOSIS — R278 Other lack of coordination: Secondary | ICD-10-CM | POA: Diagnosis not present

## 2024-06-15 DIAGNOSIS — I6339 Cerebral infarction due to thrombosis of other cerebral artery: Secondary | ICD-10-CM | POA: Diagnosis not present

## 2024-06-17 DIAGNOSIS — R278 Other lack of coordination: Secondary | ICD-10-CM | POA: Diagnosis not present

## 2024-06-17 DIAGNOSIS — I6339 Cerebral infarction due to thrombosis of other cerebral artery: Secondary | ICD-10-CM | POA: Diagnosis not present

## 2024-06-17 DIAGNOSIS — R2689 Other abnormalities of gait and mobility: Secondary | ICD-10-CM | POA: Diagnosis not present

## 2024-06-20 DIAGNOSIS — I6339 Cerebral infarction due to thrombosis of other cerebral artery: Secondary | ICD-10-CM | POA: Diagnosis not present

## 2024-06-20 DIAGNOSIS — R2689 Other abnormalities of gait and mobility: Secondary | ICD-10-CM | POA: Diagnosis not present

## 2024-06-20 DIAGNOSIS — R278 Other lack of coordination: Secondary | ICD-10-CM | POA: Diagnosis not present

## 2024-06-21 ENCOUNTER — Encounter: Payer: Self-pay | Admitting: Nurse Practitioner

## 2024-06-21 DIAGNOSIS — M6281 Muscle weakness (generalized): Secondary | ICD-10-CM | POA: Diagnosis not present

## 2024-06-21 DIAGNOSIS — I6339 Cerebral infarction due to thrombosis of other cerebral artery: Secondary | ICD-10-CM | POA: Diagnosis not present

## 2024-06-21 DIAGNOSIS — R278 Other lack of coordination: Secondary | ICD-10-CM | POA: Diagnosis not present

## 2024-06-21 DIAGNOSIS — R296 Repeated falls: Secondary | ICD-10-CM | POA: Diagnosis not present

## 2024-06-22 DIAGNOSIS — R2689 Other abnormalities of gait and mobility: Secondary | ICD-10-CM | POA: Diagnosis not present

## 2024-06-22 DIAGNOSIS — I6339 Cerebral infarction due to thrombosis of other cerebral artery: Secondary | ICD-10-CM | POA: Diagnosis not present

## 2024-06-22 DIAGNOSIS — R278 Other lack of coordination: Secondary | ICD-10-CM | POA: Diagnosis not present

## 2024-06-23 DIAGNOSIS — R296 Repeated falls: Secondary | ICD-10-CM | POA: Diagnosis not present

## 2024-06-23 DIAGNOSIS — R278 Other lack of coordination: Secondary | ICD-10-CM | POA: Diagnosis not present

## 2024-06-23 DIAGNOSIS — M6281 Muscle weakness (generalized): Secondary | ICD-10-CM | POA: Diagnosis not present

## 2024-06-23 DIAGNOSIS — I6339 Cerebral infarction due to thrombosis of other cerebral artery: Secondary | ICD-10-CM | POA: Diagnosis not present

## 2024-06-24 DIAGNOSIS — I6339 Cerebral infarction due to thrombosis of other cerebral artery: Secondary | ICD-10-CM | POA: Diagnosis not present

## 2024-06-24 DIAGNOSIS — R2689 Other abnormalities of gait and mobility: Secondary | ICD-10-CM | POA: Diagnosis not present

## 2024-06-24 DIAGNOSIS — R278 Other lack of coordination: Secondary | ICD-10-CM | POA: Diagnosis not present

## 2024-06-27 DIAGNOSIS — R2689 Other abnormalities of gait and mobility: Secondary | ICD-10-CM | POA: Diagnosis not present

## 2024-06-27 DIAGNOSIS — R278 Other lack of coordination: Secondary | ICD-10-CM | POA: Diagnosis not present

## 2024-06-27 DIAGNOSIS — I6339 Cerebral infarction due to thrombosis of other cerebral artery: Secondary | ICD-10-CM | POA: Diagnosis not present

## 2024-06-28 DIAGNOSIS — R278 Other lack of coordination: Secondary | ICD-10-CM | POA: Diagnosis not present

## 2024-06-28 DIAGNOSIS — R296 Repeated falls: Secondary | ICD-10-CM | POA: Diagnosis not present

## 2024-06-28 DIAGNOSIS — M6281 Muscle weakness (generalized): Secondary | ICD-10-CM | POA: Diagnosis not present

## 2024-06-28 DIAGNOSIS — I6339 Cerebral infarction due to thrombosis of other cerebral artery: Secondary | ICD-10-CM | POA: Diagnosis not present

## 2024-06-29 DIAGNOSIS — R278 Other lack of coordination: Secondary | ICD-10-CM | POA: Diagnosis not present

## 2024-06-29 DIAGNOSIS — I6339 Cerebral infarction due to thrombosis of other cerebral artery: Secondary | ICD-10-CM | POA: Diagnosis not present

## 2024-06-29 DIAGNOSIS — R2689 Other abnormalities of gait and mobility: Secondary | ICD-10-CM | POA: Diagnosis not present

## 2024-07-04 DIAGNOSIS — R278 Other lack of coordination: Secondary | ICD-10-CM | POA: Diagnosis not present

## 2024-07-04 DIAGNOSIS — I6339 Cerebral infarction due to thrombosis of other cerebral artery: Secondary | ICD-10-CM | POA: Diagnosis not present

## 2024-07-04 DIAGNOSIS — R2689 Other abnormalities of gait and mobility: Secondary | ICD-10-CM | POA: Diagnosis not present

## 2024-07-06 DIAGNOSIS — R278 Other lack of coordination: Secondary | ICD-10-CM | POA: Diagnosis not present

## 2024-07-06 DIAGNOSIS — I6339 Cerebral infarction due to thrombosis of other cerebral artery: Secondary | ICD-10-CM | POA: Diagnosis not present

## 2024-07-06 DIAGNOSIS — R2689 Other abnormalities of gait and mobility: Secondary | ICD-10-CM | POA: Diagnosis not present

## 2024-07-07 MED ORDER — ELIQUIS 2.5 MG PO TABS: 2.5000 mg | ORAL_TABLET | Freq: Two times a day (BID) | ORAL | 0 refills | Status: DC

## 2024-07-07 MED ORDER — DILTIAZEM HCL ER COATED BEADS 180 MG PO CP24: 180.0000 mg | ORAL_CAPSULE | Freq: Every day | ORAL | 0 refills | Status: DC

## 2024-07-07 MED ORDER — LORAZEPAM 0.5 MG PO TABS: 0.5000 mg | ORAL_TABLET | ORAL | 0 refills | Status: DC | PRN

## 2024-07-07 NOTE — Telephone Encounter (Signed)
 RX are pending

## 2024-07-08 ENCOUNTER — Encounter (HOSPITAL_COMMUNITY): Payer: Self-pay

## 2024-07-08 ENCOUNTER — Emergency Department (HOSPITAL_COMMUNITY)

## 2024-07-08 ENCOUNTER — Emergency Department (HOSPITAL_COMMUNITY)
Admission: EM | Admit: 2024-07-08 | Discharge: 2024-07-09 | Disposition: A | Attending: Emergency Medicine | Admitting: Emergency Medicine

## 2024-07-08 ENCOUNTER — Other Ambulatory Visit: Payer: Self-pay

## 2024-07-08 DIAGNOSIS — I4891 Unspecified atrial fibrillation: Secondary | ICD-10-CM | POA: Insufficient documentation

## 2024-07-08 DIAGNOSIS — I10 Essential (primary) hypertension: Secondary | ICD-10-CM | POA: Insufficient documentation

## 2024-07-08 DIAGNOSIS — R2689 Other abnormalities of gait and mobility: Secondary | ICD-10-CM | POA: Diagnosis not present

## 2024-07-08 DIAGNOSIS — Z79899 Other long term (current) drug therapy: Secondary | ICD-10-CM | POA: Insufficient documentation

## 2024-07-08 DIAGNOSIS — Z79890 Hormone replacement therapy (postmenopausal): Secondary | ICD-10-CM | POA: Insufficient documentation

## 2024-07-08 DIAGNOSIS — Z7901 Long term (current) use of anticoagulants: Secondary | ICD-10-CM | POA: Insufficient documentation

## 2024-07-08 DIAGNOSIS — Z7982 Long term (current) use of aspirin: Secondary | ICD-10-CM | POA: Insufficient documentation

## 2024-07-08 DIAGNOSIS — R1310 Dysphagia, unspecified: Secondary | ICD-10-CM

## 2024-07-08 DIAGNOSIS — R1311 Dysphagia, oral phase: Secondary | ICD-10-CM | POA: Insufficient documentation

## 2024-07-08 DIAGNOSIS — R918 Other nonspecific abnormal finding of lung field: Secondary | ICD-10-CM | POA: Diagnosis not present

## 2024-07-08 DIAGNOSIS — R278 Other lack of coordination: Secondary | ICD-10-CM | POA: Diagnosis not present

## 2024-07-08 DIAGNOSIS — J690 Pneumonitis due to inhalation of food and vomit: Secondary | ICD-10-CM | POA: Diagnosis not present

## 2024-07-08 DIAGNOSIS — K449 Diaphragmatic hernia without obstruction or gangrene: Secondary | ICD-10-CM | POA: Diagnosis not present

## 2024-07-08 DIAGNOSIS — E039 Hypothyroidism, unspecified: Secondary | ICD-10-CM | POA: Insufficient documentation

## 2024-07-08 DIAGNOSIS — I6339 Cerebral infarction due to thrombosis of other cerebral artery: Secondary | ICD-10-CM | POA: Diagnosis not present

## 2024-07-08 NOTE — ED Triage Notes (Signed)
 Pt coming from [facility], PT report aspiration of a Cranberry supplement pill last night.   PT report intermittent wheezing since the episode. Last episode 45 minutes ago and lasted five minutes until wheezing resolved.   Denies pain or difficulty breathing.   PT was brought to ER  by [facility]. Reported unable to take patient back home due broken Elevator. PT lives in fifth floor and required EMS to brought pt down with stair chair before coming to ER.

## 2024-07-08 NOTE — ED Provider Triage Note (Signed)
 Emergency Medicine Provider Triage Evaluation Note  Stacy [name] , a 83 y.o. female  was evaluated in triage.  Pt complains of possible aspiration.  Patient reports she takes her nightly meds with applesauce.  She reports that after she had to have abscess she had not had any choking but felt that she had a wheeze in her throat.  Reports she googled her symptoms and was concerned that she may have aspirated.  She was able to eat and drink today however she felt that the wheezing reoccurred when she took her Benefiber.  Denies any nausea or vomiting or trouble swallowing.  Review of Systems  Positive:  Negative:   Physical Exam  BP (!) 164/94 (BP Location: Right Arm)   Pulse 77   Temp 98.3 F (36.8 C)   Resp 19   Ht 5' 5 (1.651 m)   Wt 52.2 kg   SpO2 96%   BMI 19.14 kg/m  Gen:   Awake, no distress   Resp:  Normal effort  MSK:   Moves extremities without difficulty  Other:  Controlling secretions.  Normal phonation.  No stridor.  Lungs are clear to auscultation bilaterally.  Satting 96% on room air without any increased work of breathing.  Medical Decision Making  Medically screening exam initiated at 7:27 PM.  Appropriate orders placed.  Cadee Agro was informed that the remainder of the evaluation will be completed by another provider, this initial triage assessment does not replace that evaluation, and the importance of remaining in the ED until their evaluation is complete.  Chest x-ray ordered   Bernis Ernst, DEVONNA 07/08/24 8065

## 2024-07-09 LAB — CBC WITH DIFFERENTIAL/PLATELET
Abs Immature Granulocytes: 0.02 K/uL (ref 0.00–0.07)
Basophils Absolute: 0 K/uL (ref 0.0–0.1)
Basophils Relative: 0 %
Eosinophils Absolute: 0.1 K/uL (ref 0.0–0.5)
Eosinophils Relative: 2 %
HCT: 43.4 % (ref 36.0–46.0)
Hemoglobin: 14.9 g/dL (ref 12.0–15.0)
Immature Granulocytes: 0 %
Lymphocytes Relative: 15 %
Lymphs Abs: 1.1 K/uL (ref 0.7–4.0)
MCH: 32.8 pg (ref 26.0–34.0)
MCHC: 34.3 g/dL (ref 30.0–36.0)
MCV: 95.6 fL (ref 80.0–100.0)
Monocytes Absolute: 0.8 K/uL (ref 0.1–1.0)
Monocytes Relative: 11 %
Neutro Abs: 5 K/uL (ref 1.7–7.7)
Neutrophils Relative %: 72 %
Platelets: 150 K/uL (ref 150–400)
RBC: 4.54 MIL/uL (ref 3.87–5.11)
RDW: 13.2 % (ref 11.5–15.5)
WBC: 7 K/uL (ref 4.0–10.5)
nRBC: 0 % (ref 0.0–0.2)

## 2024-07-09 LAB — BASIC METABOLIC PANEL WITH GFR
Anion gap: 16 — ABNORMAL HIGH (ref 5–15)
BUN: 11 mg/dL (ref 8–23)
CO2: 21 mmol/L — ABNORMAL LOW (ref 22–32)
Calcium: 9.5 mg/dL (ref 8.9–10.3)
Chloride: 94 mmol/L — ABNORMAL LOW (ref 98–111)
Creatinine, Ser: 0.58 mg/dL (ref 0.44–1.00)
GFR, Estimated: >60 mL/min (ref >60)
Glucose, Bld: 99 mg/dL (ref 70–99)
Potassium: 3.6 mmol/L (ref 3.5–5.1)
Sodium: 131 mmol/L — ABNORMAL LOW (ref 135–145)

## 2024-07-09 MED ORDER — ALUM & MAG HYDROXIDE-SIMETH 200-200-20 MG/5ML PO SUSP
30.0000 mL | Freq: Once | ORAL | Status: AC
  Administered 2024-07-09: 30 mL via ORAL
  Filled 2024-07-09: qty 30

## 2024-07-09 MED ORDER — AZITHROMYCIN 250 MG PO TABS: 250.0000 mg | ORAL_TABLET | Freq: Every day | ORAL | 0 refills | Status: DC

## 2024-07-09 MED ORDER — APIXABAN 2.5 MG PO TABS
2.5000 mg | ORAL_TABLET | Freq: Two times a day (BID) | ORAL | Status: DC
  Administered 2024-07-09: 2.5 mg via ORAL
  Filled 2024-07-09: qty 1

## 2024-07-09 MED ORDER — OMEPRAZOLE 20 MG PO CPDR: 20.0000 mg | DELAYED_RELEASE_CAPSULE | Freq: Every day | ORAL | 0 refills | Status: DC

## 2024-07-09 NOTE — ED Notes (Signed)
 Pt not in room...KM

## 2024-07-09 NOTE — ED Notes (Signed)
 Pt stated her facility security will help transport her from the hospital back to her room at facility.

## 2024-07-09 NOTE — Discharge Instructions (Signed)
 You were seen today with concern for aspiration.  Clinically I think it is more likely that you have pill esophagitis or difficulty swallowing.  It is very important that you take plenty of water after swallowing your pills.  Start omeprazole  daily to see if this helps with feelings of foreign body sensation.  Your labs are reassuring.  Your x-ray does show some streaky changes that could be related to aspiration.  Given that you do not have any cough or fever, you will be placed on a mild antibiotic.  Again make sure that you are taking plenty of water after ingesting your pills.  Follow-up with gastroenterology.  If you develop fevers, worsening respiratory symptoms, any new or worsening symptoms, you should be reevaluated.

## 2024-07-09 NOTE — ED Provider Notes (Signed)
 [place] EMERGENCY DEPARTMENT AT [facility] Provider Note   CSN: [id number] Arrival date & time: 07/08/24  1743     Patient presents with: No chief complaint on file.   Stacy [name] is a 83 y.o. female.   HPI     This is an 83 year old female who presents with concern for aspiration.  Patient reports that approximately 24 hours ago she was taking her nightly meds including a cranberry pill with applesauce.  She states that after taking the pill she felt like it did not go down correctly and she began to wheeze.  She did not have any coughing during this time or shortness of breath.  She states that since that time she has occasional fullness when she eats or drinks anything but is able to tolerate solids and liquids.  She has not had any fevers.  She felt like the wheezing recurred tonight when she took her Benefiber.  She has not had any nausea or vomiting.  Prior to Admission medications   Medication Sig Start Date End Date Taking? Authorizing Provider  azithromycin  (ZITHROMAX ) 250 MG tablet Take 1 tablet (250 mg total) by mouth daily. Take first 2 tablets together, then 1 every day until finished. 07/09/24  Yes Grissel Tyrell, Charmaine FALCON, MD  omeprazole  (PRILOSEC) 20 MG capsule Take 1 capsule (20 mg total) by mouth daily. 07/09/24  Yes Thaddus Mcdowell, Charmaine FALCON, MD  aspirin  EC 81 MG tablet Take 1 tablet (81 mg total) by mouth daily. Swallow whole. 01/18/24   Regalado, Belkys A, MD  atorvastatin  (LIPITOR) 40 MG tablet Take 1 tablet (40 mg total) by mouth daily. 06/14/24   Mast, Man X, NP  Benzocaine-Menthol-Zinc Cl (ORAJEL 3X TOOTHACHE & GUM) 20-0.26-0.15 % GEL Use as directed in the mouth or throat as needed (Place and dissolve 1 application buccally as needed for mouth pain may keep at bedside).    [provider]  Cholecalciferol  (VITAMIN D -3) 1000 UNITS CAPS Take 1,000 Units by mouth in the morning.    [provider]  Cranberry 250-30 MG TABS Take 2 tablets by mouth  every evening.    [provider]  diltiazem  (CARTIA  XT) 180 MG 24 hr capsule Take 1 capsule (180 mg total) by mouth at bedtime. 07/07/24   Mast, Man X, NP  ELIQUIS  2.5 MG TABS tablet Take 1 tablet (2.5 mg total) by mouth 2 (two) times daily. 07/07/24   Mast, Man X, NP  lactulose  (CHRONULAC ) 10 GM/15ML solution Take 30 mLs by mouth daily.    [provider]  levothyroxine  (SYNTHROID ) 125 MCG tablet Take 1 tablet (125 mcg total) by mouth daily before breakfast. 06/14/24   Mast, Man X, NP  LORazepam  (ATIVAN ) 0.5 MG tablet Take 1 tablet (0.5 mg total) by mouth at bedtime. 06/14/24   Mast, Man X, NP  LORazepam  (ATIVAN ) 0.5 MG tablet Take 1 tablet (0.5 mg total) by mouth as needed for anxiety. 07/07/24   Mast, Man X, NP  Multiple Vitamins-Minerals (MULTIVITAMIN GUMMIES ADULT PO) Take 2 Pieces by mouth every evening.    [provider]  Multiple Vitamins-Minerals (PRESERVISION AREDS PO) Take 1 tablet by mouth at bedtime.    [provider]  polyethylene glycol (MIRALAX / GLYCOLAX) 17 g packet Take 17 g by mouth daily as needed (bowel management).    [provider]  Probiotic CHEW Chew 1 each by mouth every evening.    [provider]  Simethicone  (GAS-X PO) Take 1 tablet by mouth as  needed.    [provider]  Wheat Dextrin (BENEFIBER PO) Take 0.5 Scoops by mouth as needed.    [provider]    Allergies: Amoxicillin, Flonase [fluticasone propionate], Sulfa antibiotics, and Sulfasalazine    Review of Systems  Constitutional:  Negative for fever.  Respiratory:  Positive for wheezing. Negative for cough and shortness of breath.   Cardiovascular:  Negative for chest pain.  Gastrointestinal:  Negative for abdominal pain, nausea and vomiting.  All other systems reviewed and are negative.   Updated Vital Signs BP (!) 168/90   Pulse 63   Temp (!) 97.4 F (36.3 C) (Oral)   Resp 16   Ht 1.651 m (5' 5)   Wt 52.2 kg   SpO2 97%    BMI 19.14 kg/m   Physical Exam Vitals and nursing note reviewed.  Constitutional:      Appearance: She is well-developed. She is not ill-appearing.     Comments: Chronically ill-appearing but nontoxic  HENT:     Head: Normocephalic and atraumatic.  Eyes:     Pupils: Pupils are equal, round, and reactive to light.  Cardiovascular:     Rate and Rhythm: Normal rate and regular rhythm.     Heart sounds: Normal heart sounds.  Pulmonary:     Effort: Pulmonary effort is normal. No respiratory distress.     Breath sounds: No wheezing.  Abdominal:     Palpations: Abdomen is soft.     Tenderness: There is no abdominal tenderness.  Musculoskeletal:     Cervical back: Neck supple.  Skin:    General: Skin is warm and dry.  Neurological:     Mental Status: She is alert and oriented to person, place, and time.     (all labs ordered are listed, but only abnormal results are displayed) Labs Reviewed  BASIC METABOLIC PANEL WITH GFR - Abnormal; Notable for the following components:      Result Value   Sodium 131 (*)    Chloride 94 (*)    CO2 21 (*)    Anion gap 16 (*)    All other components within normal limits  CBC WITH DIFFERENTIAL/PLATELET    EKG: None  Radiology: DG Chest 2 View Result Date: 07/08/2024 CLINICAL DATA:  Subjective wheezing.  Possible aspiration. EXAM: CHEST - 2 VIEW COMPARISON:  Radiograph 04/03/2024 CT 04/07/2015 FINDINGS: Distorted anatomy due to severe scoliosis. The heart size and mediastinal contours are obscured by large retrocardiac hiatal hernia which contains colon on prior CT. Streaky opacities at the right lung base. No significant pleural effusion. No visible pneumothorax. No pulmonary edema. IMPRESSION: 1. Streaky opacities at the right lung base, atelectasis versus pneumonia/aspiration. 2. Large retrocardiac hiatal hernia which contains colon on prior CT. Electronically Signed   By: Andrea Gasman M.D.   On: 07/08/2024 20:29     Procedures    Medications Ordered in the ED  apixaban  (ELIQUIS ) tablet 2.5 mg (2.5 mg Oral Given 07/09/24 0234)  alum & mag hydroxide-simeth (MAALOX/MYLANTA) 200-200-20 MG/5ML suspension 30 mL (30 mLs Oral Given 07/09/24 0109)    Clinical Course as of 07/09/24 0254  Sat Jul 09, 2024  0252 Patient is tolerant p.o.  At baseline.  Suspect that she has pill esophagitis and a dyne aphasia.  Less likely aspiration without objective cough, fever, or wheezing on exam.  However will cover with antibiotics.  Patient has a penicillin allergy.  Feel that the side effect profile of Levaquin and clindamycin are significant given minimal physical  findings.  For this reason we will give azithromycin .  Recommended the patient continue to take her pills with applesauce and drink plenty of water postingestion.  Will also start on omeprazole  and provide with GI follow-up. [CH]    Clinical Course User Index [CH] Greggory Safranek, Charmaine FALCON, MD                                 Medical Decision Making Amount and/or Complexity of Data Reviewed Labs: ordered.  Risk OTC drugs. Prescription drug management.   This patient presents to the ED for concern of aspiration, this involves an extensive number of treatment options, and is a complaint that carries with it a high risk of complications and morbidity.  I considered the following differential and admission for this acute, potentially life threatening condition.  The differential diagnosis includes aspiration, dysphagia, odynophagia, pill esophagitis, retained foreign body  MDM:    This is a an 83 year old female who presents with concern for wheezing.  She thinks she may have aspirated on her cranberry pill greater than 24 hours ago.  She is nontoxic.  Vital signs notable for blood pressure 168/90.  No fevers or cough.  Clinically she has no wheezing and breath sounds are clear.  Her description of symptoms is more consistent with foreign body sensation or a odynophagia.  May be  related to esophagitis.  She has been able to tolerate fluids and solids since this so have less suspicion for retained foreign body.  No leukocytosis.  Metabolic panel is at baseline.  Chest x-ray shows atelectasis versus pneumonia in the right lower lobe.  Clinically low suspicion for pneumonia but given history will cover with antibiotics.  See clinical course and reasoning above for antibiotic choice.  Patient hemodynamically stable.  Will discharge home with omeprazole  and antibiotics.  Follow-up with GI and primary doctor recommended.  (Labs, imaging, consults)  Labs: I Ordered, and personally interpreted labs.  The pertinent results include: CBC, BMP  Imaging Studies ordered: I ordered imaging studies including chest x-ray I independently visualized and interpreted imaging. I agree with the radiologist interpretation  Additional history obtained from chart review.  External records from outside source obtained and reviewed including evaluations  Cardiac Monitoring: The patient was maintained on a cardiac monitor.  If on the cardiac monitor, I personally viewed and interpreted the cardiac monitored which showed an underlying rhythm of: Sinus  Reevaluation: After the interventions noted above, I reevaluated the patient and found that they have :stayed the same  Social Determinants of Health:  lives in independent living  Disposition: Discharge  Co morbidities that complicate the patient evaluation  Past Medical History:  Diagnosis Date   Atrial fibrillation (HCC)    echo 12/23/10 EF= >55%, stress myoview 01/30/11 normal pattern of perfusion in all myocardial regions   Carotid artery stenosis    Per [facility] new patient packet    Chicken pox    Colon polyp    Colon polyp    Dyslipidemia    Heart murmur    Hiatal hernia    Hypertension    Hypothyroidism    Laryngopharyngeal reflux    Osteopenia    Scoliosis    Seasonal allergies    Thyroid  disease    Vitamin D  deficiency       Medicines Meds ordered this encounter  Medications   alum & mag hydroxide-simeth (MAALOX/MYLANTA) 200-200-20 MG/5ML suspension 30 mL   apixaban  (ELIQUIS )  tablet 2.5 mg   omeprazole  (PRILOSEC) 20 MG capsule    Sig: Take 1 capsule (20 mg total) by mouth daily.    Dispense:  30 capsule    Refill:  0   azithromycin  (ZITHROMAX ) 250 MG tablet    Sig: Take 1 tablet (250 mg total) by mouth daily. Take first 2 tablets together, then 1 every day until finished.    Dispense:  6 tablet    Refill:  0    I have reviewed the patients home medicines and have made adjustments as needed  Problem List / ED Course: Problem List Items Addressed This Visit   None Visit Diagnoses       Odynophagia    -  Primary     Aspiration pneumonia of right lower lobe, unspecified aspiration pneumonia type (HCC)       Relevant Medications   azithromycin  (ZITHROMAX ) 250 MG tablet                Final diagnoses:  Odynophagia  Aspiration pneumonia of right lower lobe, unspecified aspiration pneumonia type [facility])    ED Discharge Orders          Ordered    omeprazole  (PRILOSEC) 20 MG capsule  Daily        07/09/24 0250    azithromycin  (ZITHROMAX ) 250 MG tablet  Daily        07/09/24 0250               Bari Charmaine FALCON, MD 07/09/24 [phone]

## 2024-07-09 NOTE — ED Notes (Signed)
 Spoke to [facility], agreed to come transport pt to facility.

## 2024-07-11 DIAGNOSIS — R278 Other lack of coordination: Secondary | ICD-10-CM | POA: Diagnosis not present

## 2024-07-11 DIAGNOSIS — R2689 Other abnormalities of gait and mobility: Secondary | ICD-10-CM | POA: Diagnosis not present

## 2024-07-11 DIAGNOSIS — I6339 Cerebral infarction due to thrombosis of other cerebral artery: Secondary | ICD-10-CM | POA: Diagnosis not present

## 2024-07-12 DIAGNOSIS — R296 Repeated falls: Secondary | ICD-10-CM | POA: Diagnosis not present

## 2024-07-12 DIAGNOSIS — M6281 Muscle weakness (generalized): Secondary | ICD-10-CM | POA: Diagnosis not present

## 2024-07-12 DIAGNOSIS — R278 Other lack of coordination: Secondary | ICD-10-CM | POA: Diagnosis not present

## 2024-07-12 DIAGNOSIS — I6339 Cerebral infarction due to thrombosis of other cerebral artery: Secondary | ICD-10-CM | POA: Diagnosis not present

## 2024-07-13 DIAGNOSIS — I6339 Cerebral infarction due to thrombosis of other cerebral artery: Secondary | ICD-10-CM | POA: Diagnosis not present

## 2024-07-13 DIAGNOSIS — R278 Other lack of coordination: Secondary | ICD-10-CM | POA: Diagnosis not present

## 2024-07-13 DIAGNOSIS — R2689 Other abnormalities of gait and mobility: Secondary | ICD-10-CM | POA: Diagnosis not present

## 2024-07-14 ENCOUNTER — Telehealth: Payer: Self-pay | Admitting: Nurse Practitioner

## 2024-07-14 ENCOUNTER — Other Ambulatory Visit: Payer: Self-pay

## 2024-07-14 DIAGNOSIS — M6281 Muscle weakness (generalized): Secondary | ICD-10-CM | POA: Diagnosis not present

## 2024-07-14 DIAGNOSIS — R296 Repeated falls: Secondary | ICD-10-CM | POA: Diagnosis not present

## 2024-07-14 DIAGNOSIS — F411 Generalized anxiety disorder: Secondary | ICD-10-CM

## 2024-07-14 DIAGNOSIS — I6339 Cerebral infarction due to thrombosis of other cerebral artery: Secondary | ICD-10-CM | POA: Diagnosis not present

## 2024-07-14 DIAGNOSIS — R278 Other lack of coordination: Secondary | ICD-10-CM | POA: Diagnosis not present

## 2024-07-14 MED ORDER — LORAZEPAM 0.5 MG PO TABS: 0.5000 mg | ORAL_TABLET | Freq: Every day | ORAL | 0 refills | Status: AC

## 2024-07-14 MED ORDER — LORAZEPAM 0.5 MG PO TABS: 0.5000 mg | ORAL_TABLET | ORAL | 0 refills | Status: DC | PRN

## 2024-07-14 MED ORDER — LORAZEPAM 0.5 MG PO TABS: 0.5000 mg | ORAL_TABLET | Freq: Three times a day (TID) | ORAL | 0 refills | Status: DC | PRN

## 2024-07-14 NOTE — Telephone Encounter (Signed)
 Verneita - Independent living nurse with [facility] stopped by the clinic to inform us  that patient is currently using the [facility] and NOT [facility]. Patient needs lorazepam  sent to

## 2024-07-14 NOTE — Telephone Encounter (Signed)
 Copied from CRM #[id number]. Topic: Clinical - Prescription Issue >> Jul 14, 2024  3:16 PM Farrel B wrote: Reason for CRM: [id number] Lum [facility]ICA[facility], who stated they received the prescription for LORazepam  (ATIVAN ) 0.5 MG tablet, however, she stated that the frequency is not listed on there would like a call back for assistance

## 2024-07-14 NOTE — Addendum Note (Signed)
 Addended by: ADLINE PARISIAN X on: 07/14/2024 05:03 PM   Modules accepted: Orders

## 2024-07-14 NOTE — Telephone Encounter (Signed)
 Message forwarded to Chrae, CMA and [facility] Mast, NP

## 2024-07-15 DIAGNOSIS — I6339 Cerebral infarction due to thrombosis of other cerebral artery: Secondary | ICD-10-CM | POA: Diagnosis not present

## 2024-07-15 DIAGNOSIS — R2689 Other abnormalities of gait and mobility: Secondary | ICD-10-CM | POA: Diagnosis not present

## 2024-07-15 DIAGNOSIS — R278 Other lack of coordination: Secondary | ICD-10-CM | POA: Diagnosis not present

## 2024-07-18 DIAGNOSIS — I6339 Cerebral infarction due to thrombosis of other cerebral artery: Secondary | ICD-10-CM | POA: Diagnosis not present

## 2024-07-18 DIAGNOSIS — R2689 Other abnormalities of gait and mobility: Secondary | ICD-10-CM | POA: Diagnosis not present

## 2024-07-18 DIAGNOSIS — R278 Other lack of coordination: Secondary | ICD-10-CM | POA: Diagnosis not present

## 2024-07-20 DIAGNOSIS — I6339 Cerebral infarction due to thrombosis of other cerebral artery: Secondary | ICD-10-CM | POA: Diagnosis not present

## 2024-07-20 DIAGNOSIS — R278 Other lack of coordination: Secondary | ICD-10-CM | POA: Diagnosis not present

## 2024-07-20 DIAGNOSIS — R2689 Other abnormalities of gait and mobility: Secondary | ICD-10-CM | POA: Diagnosis not present

## 2024-07-21 DIAGNOSIS — R278 Other lack of coordination: Secondary | ICD-10-CM | POA: Diagnosis not present

## 2024-07-21 DIAGNOSIS — R2689 Other abnormalities of gait and mobility: Secondary | ICD-10-CM | POA: Diagnosis not present

## 2024-07-21 DIAGNOSIS — I6339 Cerebral infarction due to thrombosis of other cerebral artery: Secondary | ICD-10-CM | POA: Diagnosis not present

## 2024-07-25 ENCOUNTER — Telehealth: Payer: Self-pay | Admitting: Gastroenterology

## 2024-07-25 NOTE — Telephone Encounter (Signed)
 She can certainly see Alan, that is not a problem, but a supervising MD has to review her work. I can continue to do that if she wishes to see Alan, that is fine with me.

## 2024-07-25 NOTE — Telephone Encounter (Signed)
 Good afternoon Dr. Leigh,   Patient is wishing to transfer her care to [facility]. Patient was last seen with you in 2017. Patient then went to see [facility] in 2022. Please review and advise further on scheduling.   Thank you

## 2024-08-08 ENCOUNTER — Other Ambulatory Visit: Payer: Self-pay | Admitting: *Deleted

## 2024-08-08 MED ORDER — DILTIAZEM HCL ER COATED BEADS 180 MG PO CP24: 180.0000 mg | ORAL_CAPSULE | Freq: Every day | ORAL | 1 refills | Status: AC

## 2024-08-08 MED ORDER — LORAZEPAM 0.5 MG PO TABS: 0.5000 mg | ORAL_TABLET | Freq: Three times a day (TID) | ORAL | 0 refills | Status: DC | PRN

## 2024-08-08 MED ORDER — ELIQUIS 2.5 MG PO TABS: 2.5000 mg | ORAL_TABLET | Freq: Two times a day (BID) | ORAL | 1 refills | Status: AC

## 2024-08-08 MED ORDER — ATORVASTATIN CALCIUM 40 MG PO TABS: 40.0000 mg | ORAL_TABLET | Freq: Every day | ORAL | 1 refills | Status: AC

## 2024-08-08 NOTE — Telephone Encounter (Signed)
 Zebedee with [facility] called stating that patient needs refills on medications. Requesting 90 day supply.   Lorazepam  LR: 07/14/2024 No Contract on File. Added note to upcoming appointment.   Pended Rx's and sent to [facility] for approval.

## 2024-08-09 ENCOUNTER — Encounter: Payer: Self-pay | Admitting: Pediatrics

## 2024-08-09 ENCOUNTER — Other Ambulatory Visit: Payer: Self-pay | Admitting: Nurse Practitioner

## 2024-08-09 NOTE — Telephone Encounter (Signed)
 Patient is requesting that her records be reviewed to proceed with scheduling. Please advise.

## 2024-08-09 NOTE — Telephone Encounter (Signed)
 Need to clarify - we will review all records prior to clinic appointment if she wants to be seen here.  What is her specific request?  Of note, I do not see any records from [facility] in her epic chart unless there is something else I am missing?  I am not sure I understand.  If she wants to make an office visit with us  she can do so

## 2024-08-10 NOTE — Telephone Encounter (Addendum)
 Good afternoon Dr. Leigh,   There was a misunderstanding regarding previous note. Please disregard. Patient spoke with another colleague and requested Dr. Suzann instead of Alan. Patient was scheduled for 2/12. Do you approve of this transfer? Apologies for complications. Please advise further.   Thank you.

## 2024-08-10 NOTE — Telephone Encounter (Signed)
 Sure, that is fine with me. thanks

## 2024-08-25 ENCOUNTER — Encounter: Payer: Self-pay | Admitting: Nurse Practitioner

## 2024-08-25 ENCOUNTER — Encounter: Admitting: Nurse Practitioner

## 2024-08-25 ENCOUNTER — Non-Acute Institutional Stay: Admitting: Nurse Practitioner

## 2024-08-25 VITALS — BP 116/78 | HR 88 | Temp 98.4°F | Ht 65.0 in | Wt 114.2 lb

## 2024-08-25 DIAGNOSIS — I10 Essential (primary) hypertension: Secondary | ICD-10-CM

## 2024-08-25 DIAGNOSIS — E039 Hypothyroidism, unspecified: Secondary | ICD-10-CM

## 2024-08-25 DIAGNOSIS — E782 Mixed hyperlipidemia: Secondary | ICD-10-CM

## 2024-08-25 DIAGNOSIS — I6330 Cerebral infarction due to thrombosis of unspecified cerebral artery: Secondary | ICD-10-CM

## 2024-08-25 DIAGNOSIS — F419 Anxiety disorder, unspecified: Secondary | ICD-10-CM | POA: Diagnosis not present

## 2024-08-25 DIAGNOSIS — E871 Hypo-osmolality and hyponatremia: Secondary | ICD-10-CM

## 2024-08-25 DIAGNOSIS — I480 Paroxysmal atrial fibrillation: Secondary | ICD-10-CM | POA: Diagnosis not present

## 2024-08-25 NOTE — Assessment & Plan Note (Signed)
 GAD/insomnia, on Lorazepam 

## 2024-08-25 NOTE — Assessment & Plan Note (Signed)
 taking levothyroxine , TSH 2.79 03/29/2024

## 2024-08-25 NOTE — Assessment & Plan Note (Signed)
 CVA, right thalamus MRI brain,  with left sided weakness and dysarthria  hospitalized 01/14/24-01/18/24, f/u stroke clinic/neurology. On ASA, Atrovastatin, Eliquis (initially held ASA/Eliquis  due to micro hemorrhage within the lesion and chronic in the posterior left frontal lobe)

## 2024-08-25 NOTE — Assessment & Plan Note (Signed)
 Blood pressure is controlled on Amlodipine, Diltiazem . Bun/creat 11/0.58 07/09/24

## 2024-08-25 NOTE — Assessment & Plan Note (Signed)
 on Eliquis , Diltiazem (declined increasing dose for rapid heart beats 04/18/24)

## 2024-08-25 NOTE — Patient Instructions (Addendum)
 1/) F/u in clinic 6 months. Labs prior  2.) Fasting labs on February 28, 2025 @ [facility] at 7:00 am

## 2024-08-25 NOTE — Assessment & Plan Note (Signed)
 HLD/carotid artery stenosis, on Atorvastatin , LDL 73 02/18/24

## 2024-08-25 NOTE — Assessment & Plan Note (Signed)
 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 Na 131 07/09/24 @ her baseline.

## 2024-08-25 NOTE — Progress Notes (Signed)
 " Location:   [facility]   Place of Service:  Clinic (12) Provider: Larwance Costantino Kohlbeck NP  Richa Shor X, NP  Patient Care Team: Sammie Schermerhorn X, NP as PCP - General (Internal Medicine) Cindie Ole DASEN, MD (Inactive) as PCP - Electrophysiology (Cardiology) Mona Vinie BROCKS, MD as PCP - Cardiology (Cardiology) Dann Candyce RAMAN, MD as Consulting Physician (Cardiology)  Extended Emergency Contact Information Primary Emergency Contact: [place], [place] [address] [place] Home Phone: [phone] Mobile Phone: [phone] Relation: Niece Secondary Emergency Contact: THOMAS,JEFFERY Mobile Phone: [phone] Relation: Other  Code Status:  DNR Goals of care: Advanced Directive information    08/25/2024    8:55 AM  Advanced Directives  Does Patient Have a Medical Advance Directive? Yes  Type of Estate Agent of [place];Living will  Does patient want to make changes to medical advance directive? No - Patient declined  Copy of Healthcare Power of Attorney in Chart? Yes - validated most recent copy scanned in chart (See row information)     Chief Complaint  Patient presents with   Medical Management of Chronic Issues    Routine follow-up. Discussed need for shingrix ([facility] verified) (patient declined). Patient c/o constipation and nocturia (pending appointment with urologist and gastroenterologist)   Medication Management    Sign treatment agreement  for Lorazepam      HPI:  Pt is a 84 y.o. female seen today for medical management of chronic diseases.    ED 07/08/24 aspiration PNA R lower lobe, fully treated  ED evaluation 04/17/2024 for scalp hematoma, unremarkable CMP, PT/INR, CBC with differential, x-ray of the right shoulder, CT head/cervical spine. Negative venous US  @ [facility]               CVA, right thalamus MRI brain, with left sided weakness and dysarthria hospitalized 01/14/24-01/18/24, f/u stroke clinic/neurology. 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 Na 131 07/09/24 @ her baseline.              GAD/insomnia, on Lorazepam              HTN, on Amlodipine, Diltiazem . Bun/creat 11/0.58 07/09/24             Afib, on Eliquis , Diltiazem (declined increasing dose for rapid heart beats 04/18/24)             Chronic cystitis/urinary frequency.  Hypothyroidism, taking levothyroxine , TSH 2.79 03/29/2024             HLD/carotid artery stenosis, on Atorvastatin , LDL 73 02/18/24             IBS/constipation, managed.               Past Medical History:  Diagnosis Date   Allergy Don't remember   Anxiety April 2020   Atrial fibrillation (HCC)    echo 12/23/10 EF= >55%, stress myoview 01/30/11 normal pattern of perfusion in all myocardial regions   Carotid artery stenosis    Per [facility] new patient packet    Chicken pox    Colon polyp    Colon polyp    Dyslipidemia    Heart murmur    Hiatal hernia    Hypertension    Hypothyroidism    Laryngopharyngeal reflux    Osteopenia  Scoliosis    Seasonal allergies    Thyroid  disease    Vitamin D  deficiency    Past Surgical History:  Procedure Laterality Date   BREAST CYST EXCISION Right 1988   ESOPHAGEAL MANOMETRY N/A 08/29/2015   Procedure: ESOPHAGEAL MANOMETRY (EM);  Surgeon: Elspeth Deward Naval, MD;  Location: [facility];  Service: Gastroenterology;  Laterality: N/A;   EYE SURGERY     Cataract eye surgery-right 06/2014, left 04/2014   ORIF PATELLA Right 07/08/2022   Procedure: OPEN REDUCTION INTERNAL FIXATION (ORIF) PATELLA;  Surgeon: Josefina Chew, MD;  Location: [facility];  Service: Orthopedics;  Laterality: Right;   TONSILLECTOMY     Childhood    Allergies[1]  Allergies as of 08/25/2024       Reactions    Amoxicillin Swelling   Flonase [fluticasone Propionate] Swelling   Swelling of throat per patient   Sulfa Antibiotics Other (See Comments)   Upset stomach   Sulfasalazine Other (See Comments)   Upset stomach        Medication List        Accurate as of August 25, 2024 11:59 PM. If you have any questions, ask your nurse or doctor.          STOP taking these medications    azithromycin  250 MG tablet Commonly known as: ZITHROMAX  Stopped by: Sara Keys, NP   lactulose  10 GM/15ML solution Commonly known as: CHRONULAC  Stopped by: Tayley Mudrick, NP   omeprazole  20 MG capsule Commonly known as: PRILOSEC Stopped by: Tyan Dy, NP       TAKE these medications    aspirin  EC 81 MG tablet Take 1 tablet (81 mg total) by mouth daily. Swallow whole.   atorvastatin  40 MG tablet Commonly known as: LIPITOR Take 1 tablet (40 mg total) by mouth daily.   BENEFIBER PO Take 0.5 Scoops by mouth as needed.   Cranberry 250-30 MG Tabs Take 2 tablets by mouth every evening.   diltiazem  180 MG 24 hr capsule Commonly known as: Cartia  XT Take 1 capsule (180 mg total) by mouth at bedtime.   Eliquis  2.5 MG Tabs tablet Generic drug: apixaban  Take 1 tablet (2.5 mg total) by mouth 2 (two) times daily.   GAS-X PO Take 1 tablet by mouth daily.   levothyroxine  125 MCG tablet Commonly known as: SYNTHROID  Take 1 tablet (125 mcg total) by mouth daily before breakfast.   LORazepam  0.5 MG tablet Commonly known as: ATIVAN  Take 1 tablet (0.5 mg total) by mouth at bedtime. What changed: Another medication with the same name was removed. Continue taking this medication, and follow the directions you see here. Changed by: Noach Calvillo, NP   Orajel 3X Toothache & Gum 20-0.26-0.15 % Gel Generic drug: Benzocaine-Menthol-Zinc Cl Use as directed in the mouth or throat as needed (Place and dissolve 1 application buccally as needed for mouth pain may keep at bedside).   polyethylene glycol 17 g  packet Commonly known as: MIRALAX / GLYCOLAX Take 17 g by mouth daily as needed (bowel management).   PRESERVISION AREDS PO Take 1 tablet by mouth at bedtime.   MULTIVITAMIN GUMMIES ADULT PO Take 2 Pieces by mouth every evening.   Probiotic Chew Chew 1 each by mouth every evening.   Vitamin D -3 25 MCG (1000 UT) Caps Take 1,000 Units by mouth in the morning.        Review of Systems  Constitutional:  Negative for appetite change, fatigue and fever.  HENT:  Negative for congestion and  trouble swallowing.   Eyes:  Negative for visual disturbance.  Respiratory:  Negative for cough and shortness of breath.   Cardiovascular:  Positive for leg swelling. Negative for chest pain and palpitations.  Gastrointestinal:  Negative for abdominal pain, constipation, nausea and vomiting.       Occasional, on diet.   Genitourinary:  Positive for frequency. Negative for difficulty urinating, dysuria and urgency.  Musculoskeletal:  Negative for back pain and gait problem.  Skin:  Negative for color change.  Neurological:  Positive for weakness. Negative for dizziness, speech difficulty and headaches.  Psychiatric/Behavioral:  Positive for sleep disturbance. Negative for behavioral problems. The patient is nervous/anxious.     Immunization History  Administered Date(s) Administered   INFLUENZA, HIGH DOSE SEASONAL PF 06/05/2015, 06/13/2016, 06/15/2017, 05/31/2018, 05/16/2019, 05/16/2020, 06/03/2023, 05/10/2024   Influenza,inj,Quad PF,6+ Mos 05/19/2014   Influenza,inj,quad, With Preservative 05/31/2018   Moderna Covid-19 Fall Seasonal Vaccine 46yrs & older 05/20/2023   Moderna SARS-COV2 Booster Vaccination 06/12/2020   Moderna Sars-Covid-2 Vaccination 08/08/2019, 09/05/2019   PNEUMOCOCCAL CONJUGATE-20 04/16/2023   Tdap 08/04/2010   Zoster Recombinant(Shingrix) 05/13/2011   Pertinent  Health Maintenance Due  Topic Date Due   Influenza Vaccine  Completed   Bone Density Scan  Discontinued       09/30/2020    3:25 PM 12/13/2020    3:25 PM 07/07/2022   11:36 AM 07/08/2022   10:43 AM 08/25/2024    1:36 PM  Fall Risk  Falls in the past year?  0   1  Was there an injury with Fall?  0    0  Fall Risk Category Calculator  0   1  Fall Risk Category (Retired)  Low      (RETIRED) Patient Fall Risk Level Low fall risk  Low fall risk  Low fall risk  High fall risk    Patient at Risk for Falls Due to     History of fall(s)  Fall risk Follow up     Falls evaluation completed     Data saved with a previous flowsheet row definition   Functional Status Survey:    Vitals:   08/25/24 0852  BP: 116/78  Pulse: 88  Temp: 98.4 F (36.9 C)  SpO2: 96%  Weight: 114 lb 3.2 oz (51.8 kg)  Height: 5' 5 (1.651 m)   Body mass index is 19 kg/m. Physical Exam Vitals and nursing note reviewed.  Constitutional:      Appearance: Normal appearance.  HENT:     Head: Normocephalic and atraumatic.     Nose: Nose normal.     Mouth/Throat:     Mouth: Mucous membranes are moist.  Eyes:     Extraocular Movements: Extraocular movements intact.     Conjunctiva/sclera: Conjunctivae normal.     Pupils: Pupils are equal, round, and reactive to light.  Cardiovascular:     Rate and Rhythm: Normal rate. Rhythm irregular.     Heart sounds: Murmur heard.  Pulmonary:     Effort: Pulmonary effort is normal.     Breath sounds: No rales.  Abdominal:     General: Bowel sounds are normal.     Palpations: Abdomen is soft.     Tenderness: There is no abdominal tenderness.  Musculoskeletal:     Cervical back: Normal range of motion and neck supple.     Right lower leg: No edema.     Left lower leg: Edema present.     Comments: Trace edema LLE  Skin:  General: Skin is warm and dry.  Neurological:     General: No focal deficit present.     Mental Status: She is alert and oriented to person, place, and time. Mental status is at baseline.     Motor: Weakness present.     Coordination: Coordination  normal.     Gait: Gait abnormal.     Deep Tendon Reflexes: Reflexes normal.     Comments: Left sided weakness with muscle strength 5/5-no change  Psychiatric:        Mood and Affect: Mood normal.        Behavior: Behavior normal.        Thought Content: Thought content normal.        Judgment: Judgment normal.     Comments: Anxious     Labs reviewed: Recent Labs    01/02/24 2248 01/03/24 0214 01/14/24 1224 01/14/24 1226 01/16/24 1819 01/17/24 1129 01/18/24 0443 04/17/24 0353 04/28/24 0000 05/17/24 0000 07/09/24 0103  NA  --    < > 129*   < > 127* 127* 128* 135 132* 133* 131*  K  --    < > 3.9   < > 3.3* 4.1 3.8 3.5 3.8 3.6 3.6  CL  --    < > 96*   < > 91* 93* 95* 97* 96* 96* 94*  CO2  --    < > 23   < > 25 25 25 28  30* 31* 21*  GLUCOSE  --    < > 113*   < > 92 109* 88 119*  --   --  99  BUN  --    < > 19   < > 16 11 17 14 9 12 11   CREATININE  --    < > 0.89   < > 0.70 0.56 0.63 0.71 0.5 0.6 0.58  CALCIUM   --    < > 9.2   < > 8.7* 8.8* 8.5* 9.3 8.6* 9.0 9.5  MG 1.9  --  1.9  --  1.8 2.0  --   --   --   --   --   PHOS 3.1  --   --   --   --   --   --   --   --   --   --    < > = values in this interval not displayed.   Recent Labs    01/14/24 1224 01/15/24 0543 02/18/24 0000 04/17/24 0353  AST 26 34 14 32  ALT 20 22 16 30   ALKPHOS 56 68 67 67  BILITOT 0.9 1.1  --  1.0  PROT 6.5 6.2*  --  6.2*  ALBUMIN 3.9 3.6 3.4* 3.6   Recent Labs    01/14/24 1224 01/14/24 1226 01/16/24 1111 04/17/24 0353 07/09/24 0103  WBC 5.4   < > 6.7 9.8 7.0  NEUTROABS 3.9  --   --  7.3 5.0  HGB 13.6   < > 14.4 13.4 14.9  HCT 38.8   < > 40.5 40.0 43.4  MCV 93.9   < > 92.0 99.5 95.6  PLT 153   < > 154 149* 150   < > = values in this interval not displayed.   Lab Results  Component Value Date   TSH 5.952 (H) 01/14/2024   Lab Results  Component Value Date   HGBA1C 5.0 01/15/2024   Lab Results  Component Value Date   CHOL 158 02/18/2024   HDL 71 (A) 02/18/2024  LDLCALC 73  02/18/2024   LDLDIRECT 148.1 08/17/2013   TRIG 49 02/18/2024   CHOLHDL 2.3 01/15/2024    Significant Diagnostic Results in last 30 days:  No results found.  Assessment/Plan  Atrial fibrillation (HCC) on Eliquis , Diltiazem (declined increasing dose for rapid heart beats 04/18/24)  Hypothyroidism  taking levothyroxine , TSH 2.79 03/29/2024              Mixed hyperlipidemia HLD/carotid artery stenosis, on Atorvastatin , LDL 73 02/18/24  Hypertension Blood pressure is controlled on Amlodipine, Diltiazem . 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 Na 131 07/09/24 @ her baseline.   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 On ASA, Atrovastatin, Eliquis (initially held ASA/Eliquis  due to micro hemorrhage within the lesion and chronic in the posterior left frontal lobe)    Family/ staff Communication: plan of care reviewed with the patient  Labs/tests ordered:  CBC/diff, CMP/eGFR, Hgba1c, lipids, Vit D, Vit B12, TSH      [1]  Allergies Allergen Reactions   Amoxicillin Swelling   Flonase [Fluticasone Propionate] Swelling    Swelling of throat per patient    Sulfa Antibiotics Other (See Comments)    Upset stomach   Sulfasalazine Other (See Comments)    Upset stomach   "

## 2024-09-08 ENCOUNTER — Other Ambulatory Visit: Payer: Self-pay | Admitting: Nurse Practitioner

## 2024-09-08 DIAGNOSIS — E039 Hypothyroidism, unspecified: Secondary | ICD-10-CM

## 2024-09-08 MED ORDER — LEVOTHYROXINE SODIUM 125 MCG PO TABS: 125.0000 ug | ORAL_TABLET | Freq: Every day | ORAL | 1 refills | Status: AC

## 2024-09-15 ENCOUNTER — Ambulatory Visit: Admitting: Pediatrics

## 2024-10-03 ENCOUNTER — Ambulatory Visit: Admitting: Internal Medicine

## 2025-03-02 ENCOUNTER — Encounter: Admitting: Nurse Practitioner
